# Patient Record
Sex: Male | Born: 1955
Health system: Southern US, Community
[De-identification: ages and names within clinical notes are randomized; demographics above are authoritative.]

## PROBLEM LIST (undated history)

## (undated) DIAGNOSIS — K219 Gastro-esophageal reflux disease without esophagitis: Secondary | ICD-10-CM

## (undated) DIAGNOSIS — J45909 Unspecified asthma, uncomplicated: Secondary | ICD-10-CM

## (undated) DIAGNOSIS — I2510 Atherosclerotic heart disease of native coronary artery without angina pectoris: Secondary | ICD-10-CM

## (undated) DIAGNOSIS — K222 Esophageal obstruction: Secondary | ICD-10-CM

## (undated) DIAGNOSIS — E119 Type 2 diabetes mellitus without complications: Secondary | ICD-10-CM

## (undated) DIAGNOSIS — G459 Transient cerebral ischemic attack, unspecified: Secondary | ICD-10-CM

## (undated) DIAGNOSIS — I219 Acute myocardial infarction, unspecified: Secondary | ICD-10-CM

## (undated) DIAGNOSIS — J302 Other seasonal allergic rhinitis: Secondary | ICD-10-CM

## (undated) DIAGNOSIS — E669 Obesity, unspecified: Secondary | ICD-10-CM

## (undated) DIAGNOSIS — G4700 Insomnia, unspecified: Secondary | ICD-10-CM

## (undated) DIAGNOSIS — G4733 Obstructive sleep apnea (adult) (pediatric): Secondary | ICD-10-CM

## (undated) DIAGNOSIS — K2970 Gastritis, unspecified, without bleeding: Secondary | ICD-10-CM

## (undated) DIAGNOSIS — I10 Essential (primary) hypertension: Secondary | ICD-10-CM

## (undated) DIAGNOSIS — M51369 Other intervertebral disc degeneration, lumbar region without mention of lumbar back pain or lower extremity pain: Secondary | ICD-10-CM

## (undated) DIAGNOSIS — M109 Gout, unspecified: Secondary | ICD-10-CM

## (undated) DIAGNOSIS — E785 Hyperlipidemia, unspecified: Secondary | ICD-10-CM

## (undated) DIAGNOSIS — K922 Gastrointestinal hemorrhage, unspecified: Secondary | ICD-10-CM

## (undated) DIAGNOSIS — M5136 Other intervertebral disc degeneration, lumbar region: Secondary | ICD-10-CM

## (undated) DIAGNOSIS — K5792 Diverticulitis of intestine, part unspecified, without perforation or abscess without bleeding: Secondary | ICD-10-CM

## (undated) DIAGNOSIS — Z9989 Dependence on other enabling machines and devices: Secondary | ICD-10-CM

## (undated) DIAGNOSIS — M1990 Unspecified osteoarthritis, unspecified site: Secondary | ICD-10-CM

## (undated) HISTORY — DX: Other intervertebral disc degeneration, lumbar region without mention of lumbar back pain or lower extremity pain: M51.369

## (undated) HISTORY — DX: Unspecified asthma, uncomplicated: J45.909

## (undated) HISTORY — DX: Esophageal obstruction: K22.2

## (undated) HISTORY — DX: Type 2 diabetes mellitus without complications: E11.9

## (undated) HISTORY — DX: Atherosclerotic heart disease of native coronary artery without angina pectoris: I25.10

## (undated) HISTORY — DX: Gout, unspecified: M10.9

## (undated) HISTORY — DX: Insomnia, unspecified: G47.00

## (undated) HISTORY — PX: NASAL SINUS SURGERY: SHX719

## (undated) HISTORY — DX: Transient cerebral ischemic attack, unspecified: G45.9

## (undated) HISTORY — DX: Gastro-esophageal reflux disease without esophagitis: K21.9

## (undated) HISTORY — DX: Gastritis, unspecified, without bleeding: K29.70

## (undated) HISTORY — PX: VASECTOMY: SHX75

## (undated) HISTORY — DX: Unspecified osteoarthritis, unspecified site: M19.90

## (undated) HISTORY — DX: Obesity, unspecified: E66.9

## (undated) HISTORY — PX: ESOPHAGOGASTRODUODENOSCOPY: SHX1529

## (undated) HISTORY — DX: Gastrointestinal hemorrhage, unspecified: K92.2

## (undated) HISTORY — DX: Obstructive sleep apnea (adult) (pediatric): G47.33

## (undated) HISTORY — DX: Other intervertebral disc degeneration, lumbar region: M51.36

## (undated) HISTORY — DX: Other seasonal allergic rhinitis: J30.2

## (undated) HISTORY — DX: Hyperlipidemia, unspecified: E78.5

## (undated) HISTORY — DX: Essential (primary) hypertension: I10

## (undated) HISTORY — DX: Dependence on other enabling machines and devices: Z99.89

---

## 1999-04-24 ENCOUNTER — Emergency Department (HOSPITAL_COMMUNITY): Admission: EM | Admit: 1999-04-24 | Discharge: 1999-04-24 | Payer: Self-pay | Admitting: Emergency Medicine

## 1999-05-29 ENCOUNTER — Emergency Department (HOSPITAL_COMMUNITY): Admission: EM | Admit: 1999-05-29 | Discharge: 1999-05-30 | Payer: Self-pay | Admitting: *Deleted

## 2000-06-14 ENCOUNTER — Ambulatory Visit (HOSPITAL_COMMUNITY): Admission: RE | Admit: 2000-06-14 | Discharge: 2000-06-14 | Payer: Self-pay | Admitting: Cardiology

## 2000-06-14 ENCOUNTER — Encounter: Payer: Self-pay | Admitting: Cardiology

## 2002-09-05 ENCOUNTER — Emergency Department (HOSPITAL_COMMUNITY): Admission: EM | Admit: 2002-09-05 | Discharge: 2002-09-05 | Payer: Self-pay | Admitting: Emergency Medicine

## 2002-09-05 ENCOUNTER — Encounter: Payer: Self-pay | Admitting: Emergency Medicine

## 2002-11-18 ENCOUNTER — Emergency Department (HOSPITAL_COMMUNITY): Admission: EM | Admit: 2002-11-18 | Discharge: 2002-11-18 | Payer: Self-pay | Admitting: Emergency Medicine

## 2002-11-19 ENCOUNTER — Encounter: Payer: Self-pay | Admitting: Emergency Medicine

## 2003-04-26 ENCOUNTER — Emergency Department (HOSPITAL_COMMUNITY): Admission: EM | Admit: 2003-04-26 | Discharge: 2003-04-26 | Payer: Self-pay | Admitting: Family Medicine

## 2003-07-16 ENCOUNTER — Emergency Department (HOSPITAL_COMMUNITY): Admission: EM | Admit: 2003-07-16 | Discharge: 2003-07-16 | Payer: Self-pay | Admitting: Emergency Medicine

## 2003-10-15 ENCOUNTER — Inpatient Hospital Stay (HOSPITAL_COMMUNITY): Admission: EM | Admit: 2003-10-15 | Discharge: 2003-10-16 | Payer: Self-pay | Admitting: Emergency Medicine

## 2003-11-11 ENCOUNTER — Ambulatory Visit: Payer: Self-pay | Admitting: Internal Medicine

## 2003-12-06 ENCOUNTER — Ambulatory Visit: Payer: Self-pay | Admitting: Internal Medicine

## 2003-12-06 ENCOUNTER — Ambulatory Visit (HOSPITAL_COMMUNITY): Admission: RE | Admit: 2003-12-06 | Discharge: 2003-12-06 | Payer: Self-pay | Admitting: Nurse Practitioner

## 2003-12-16 ENCOUNTER — Ambulatory Visit: Payer: Self-pay | Admitting: *Deleted

## 2004-10-26 ENCOUNTER — Ambulatory Visit: Payer: Self-pay | Admitting: Internal Medicine

## 2005-05-06 ENCOUNTER — Emergency Department (HOSPITAL_COMMUNITY): Admission: EM | Admit: 2005-05-06 | Discharge: 2005-05-06 | Payer: Self-pay | Admitting: Emergency Medicine

## 2006-01-24 ENCOUNTER — Emergency Department (HOSPITAL_COMMUNITY): Admission: EM | Admit: 2006-01-24 | Discharge: 2006-01-24 | Payer: Self-pay | Admitting: Emergency Medicine

## 2006-06-14 ENCOUNTER — Emergency Department (HOSPITAL_COMMUNITY): Admission: EM | Admit: 2006-06-14 | Discharge: 2006-06-14 | Payer: Self-pay | Admitting: Emergency Medicine

## 2006-06-24 ENCOUNTER — Encounter: Admission: RE | Admit: 2006-06-24 | Discharge: 2006-06-24 | Payer: Self-pay | Admitting: Family Medicine

## 2006-09-03 ENCOUNTER — Encounter: Admission: RE | Admit: 2006-09-03 | Discharge: 2006-09-03 | Payer: Self-pay | Admitting: Orthopedic Surgery

## 2006-09-23 ENCOUNTER — Encounter: Admission: RE | Admit: 2006-09-23 | Discharge: 2006-10-07 | Payer: Self-pay | Admitting: Orthopedic Surgery

## 2006-10-29 ENCOUNTER — Encounter: Admission: RE | Admit: 2006-10-29 | Discharge: 2006-10-29 | Payer: Self-pay | Admitting: Family Medicine

## 2007-03-13 DIAGNOSIS — I219 Acute myocardial infarction, unspecified: Secondary | ICD-10-CM

## 2007-03-13 HISTORY — DX: Acute myocardial infarction, unspecified: I21.9

## 2007-07-28 ENCOUNTER — Emergency Department (HOSPITAL_COMMUNITY): Admission: EM | Admit: 2007-07-28 | Discharge: 2007-07-28 | Payer: Self-pay | Admitting: Emergency Medicine

## 2007-12-21 ENCOUNTER — Emergency Department (HOSPITAL_COMMUNITY): Admission: EM | Admit: 2007-12-21 | Discharge: 2007-12-21 | Payer: Self-pay | Admitting: Family Medicine

## 2008-02-15 ENCOUNTER — Inpatient Hospital Stay (HOSPITAL_COMMUNITY): Admission: EM | Admit: 2008-02-15 | Discharge: 2008-02-18 | Payer: Self-pay

## 2008-02-15 ENCOUNTER — Ambulatory Visit: Payer: Self-pay | Admitting: Interventional Radiology

## 2008-02-15 ENCOUNTER — Encounter: Payer: Self-pay | Admitting: Emergency Medicine

## 2008-02-17 ENCOUNTER — Encounter (INDEPENDENT_AMBULATORY_CARE_PROVIDER_SITE_OTHER): Payer: Self-pay | Admitting: Internal Medicine

## 2008-02-17 LAB — CONVERTED CEMR LAB: TSH: 1.99 microintl units/mL

## 2008-02-18 DIAGNOSIS — M6282 Rhabdomyolysis: Secondary | ICD-10-CM | POA: Insufficient documentation

## 2008-02-23 ENCOUNTER — Ambulatory Visit: Payer: Self-pay | Admitting: Nurse Practitioner

## 2008-02-23 DIAGNOSIS — R079 Chest pain, unspecified: Secondary | ICD-10-CM | POA: Insufficient documentation

## 2008-02-23 DIAGNOSIS — E119 Type 2 diabetes mellitus without complications: Secondary | ICD-10-CM

## 2008-02-23 DIAGNOSIS — M109 Gout, unspecified: Secondary | ICD-10-CM | POA: Insufficient documentation

## 2008-02-23 DIAGNOSIS — I10 Essential (primary) hypertension: Secondary | ICD-10-CM | POA: Insufficient documentation

## 2008-02-23 DIAGNOSIS — E1159 Type 2 diabetes mellitus with other circulatory complications: Secondary | ICD-10-CM | POA: Insufficient documentation

## 2008-02-23 DIAGNOSIS — I152 Hypertension secondary to endocrine disorders: Secondary | ICD-10-CM | POA: Insufficient documentation

## 2008-02-23 DIAGNOSIS — E785 Hyperlipidemia, unspecified: Secondary | ICD-10-CM | POA: Insufficient documentation

## 2008-02-23 LAB — CONVERTED CEMR LAB
Blood Glucose, Fingerstick: 109
Cholesterol, target level: 200 mg/dL
Cholesterol: 207 mg/dL
HDL goal, serum: 40 mg/dL
HDL: 27 mg/dL
Hgb A1c MFr Bld: 5.9 %
LDL Cholesterol: 120 mg/dL
LDL Goal: 100 mg/dL
Total CHOL/HDL Ratio: 7.7
Triglycerides: 301 mg/dL
VLDL: 60 mg/dL

## 2008-02-24 ENCOUNTER — Encounter (INDEPENDENT_AMBULATORY_CARE_PROVIDER_SITE_OTHER): Payer: Self-pay | Admitting: Nurse Practitioner

## 2008-02-24 LAB — CONVERTED CEMR LAB: Uric Acid, Serum: 8.3 mg/dL — ABNORMAL HIGH

## 2008-03-01 ENCOUNTER — Ambulatory Visit: Payer: Self-pay | Admitting: Nurse Practitioner

## 2008-03-01 DIAGNOSIS — M25519 Pain in unspecified shoulder: Secondary | ICD-10-CM | POA: Insufficient documentation

## 2008-03-01 LAB — CONVERTED CEMR LAB
ALT: 38 units/L (ref 0–53)
AST: 20 units/L (ref 0–37)
Albumin: 4.4 g/dL (ref 3.5–5.2)
Alkaline Phosphatase: 78 units/L (ref 39–117)
BUN: 16 mg/dL (ref 6–23)
Basophils Absolute: 0 10*3/uL (ref 0.0–0.1)
Basophils Relative: 1 % (ref 0–1)
CK-MB: 3.5 ng/mL (ref 0.3–4.0)
CO2: 25 meq/L (ref 19–32)
Calcium: 9.9 mg/dL (ref 8.4–10.5)
Chloride: 102 meq/L (ref 96–112)
Creatinine, Ser: 1.28 mg/dL (ref 0.40–1.50)
Eosinophils Absolute: 0.6 10*3/uL (ref 0.0–0.7)
Eosinophils Relative: 7 % — ABNORMAL HIGH (ref 0–5)
Glucose, Bld: 93 mg/dL (ref 70–99)
HCT: 43.2 % (ref 39.0–52.0)
Hemoglobin: 13.7 g/dL (ref 13.0–17.0)
Lymphocytes Relative: 41 % (ref 12–46)
Lymphs Abs: 3.3 10*3/uL (ref 0.7–4.0)
MCHC: 31.7 g/dL (ref 30.0–36.0)
MCV: 84.5 fL (ref 78.0–100.0)
Monocytes Absolute: 0.5 10*3/uL (ref 0.1–1.0)
Monocytes Relative: 6 % (ref 3–12)
Neutro Abs: 3.7 10*3/uL (ref 1.7–7.7)
Neutrophils Relative %: 46 % (ref 43–77)
Platelets: 287 10*3/uL (ref 150–400)
Potassium: 4.9 meq/L (ref 3.5–5.3)
RBC: 5.11 M/uL (ref 4.22–5.81)
RDW: 15.3 % (ref 11.5–15.5)
Relative Index: 0.9 (ref 0.0–2.5)
Sodium: 138 meq/L (ref 135–145)
Total Bilirubin: 0.6 mg/dL (ref 0.3–1.2)
Total CK: 396 units/L — ABNORMAL HIGH (ref 7–232)
Total Protein: 7.6 g/dL (ref 6.0–8.3)
WBC: 8.1 10*3/uL (ref 4.0–10.5)

## 2008-03-02 ENCOUNTER — Telehealth (INDEPENDENT_AMBULATORY_CARE_PROVIDER_SITE_OTHER): Payer: Self-pay | Admitting: Nurse Practitioner

## 2008-03-08 ENCOUNTER — Ambulatory Visit: Payer: Self-pay | Admitting: Cardiology

## 2008-03-08 ENCOUNTER — Ambulatory Visit: Payer: Self-pay | Admitting: Nurse Practitioner

## 2008-03-10 ENCOUNTER — Encounter: Payer: Self-pay | Admitting: Cardiology

## 2008-03-10 ENCOUNTER — Ambulatory Visit: Payer: Self-pay

## 2008-03-10 ENCOUNTER — Encounter (INDEPENDENT_AMBULATORY_CARE_PROVIDER_SITE_OTHER): Payer: Self-pay | Admitting: Nurse Practitioner

## 2008-03-11 LAB — CONVERTED CEMR LAB
Cholesterol: 208 mg/dL — ABNORMAL HIGH (ref 0–200)
HDL: 34 mg/dL — ABNORMAL LOW (ref >39)
LDL Cholesterol: 113 mg/dL — ABNORMAL HIGH (ref 0–99)
Total CHOL/HDL Ratio: 6.1
Total CK: 550 units/L — ABNORMAL HIGH (ref 7–232)
Triglycerides: 306 mg/dL — ABNORMAL HIGH (ref <150)
VLDL: 61 mg/dL — ABNORMAL HIGH (ref 0–40)

## 2008-03-12 DIAGNOSIS — K2970 Gastritis, unspecified, without bleeding: Secondary | ICD-10-CM

## 2008-03-12 HISTORY — DX: Gastritis, unspecified, without bleeding: K29.70

## 2008-03-15 ENCOUNTER — Telehealth (INDEPENDENT_AMBULATORY_CARE_PROVIDER_SITE_OTHER): Payer: Self-pay | Admitting: Nurse Practitioner

## 2008-03-17 ENCOUNTER — Telehealth (INDEPENDENT_AMBULATORY_CARE_PROVIDER_SITE_OTHER): Payer: Self-pay | Admitting: Nurse Practitioner

## 2008-03-22 ENCOUNTER — Ambulatory Visit: Payer: Self-pay | Admitting: Nurse Practitioner

## 2008-03-22 LAB — CONVERTED CEMR LAB: Total CK: 657 units/L — ABNORMAL HIGH (ref 7–232)

## 2008-03-23 ENCOUNTER — Encounter (INDEPENDENT_AMBULATORY_CARE_PROVIDER_SITE_OTHER): Payer: Self-pay | Admitting: Nurse Practitioner

## 2008-03-23 ENCOUNTER — Telehealth (INDEPENDENT_AMBULATORY_CARE_PROVIDER_SITE_OTHER): Payer: Self-pay | Admitting: Nurse Practitioner

## 2008-03-23 LAB — CONVERTED CEMR LAB: CRP: 0.3 mg/dL (ref <0.6)

## 2008-03-24 ENCOUNTER — Ambulatory Visit: Payer: Self-pay | Admitting: Nurse Practitioner

## 2008-03-24 LAB — CONVERTED CEMR LAB
Aldolase: 10.9 units/L — ABNORMAL HIGH (ref <=8.1)
Sed Rate: 12 mm/hr (ref 0–16)

## 2008-03-31 ENCOUNTER — Ambulatory Visit: Payer: Self-pay

## 2008-03-31 ENCOUNTER — Encounter: Payer: Self-pay | Admitting: Cardiology

## 2008-04-01 ENCOUNTER — Encounter (INDEPENDENT_AMBULATORY_CARE_PROVIDER_SITE_OTHER): Payer: Self-pay | Admitting: Nurse Practitioner

## 2008-04-15 ENCOUNTER — Encounter (INDEPENDENT_AMBULATORY_CARE_PROVIDER_SITE_OTHER): Payer: Self-pay | Admitting: Nurse Practitioner

## 2008-04-15 DIAGNOSIS — R748 Abnormal levels of other serum enzymes: Secondary | ICD-10-CM | POA: Insufficient documentation

## 2008-04-16 ENCOUNTER — Ambulatory Visit: Payer: Self-pay | Admitting: Cardiology

## 2008-05-06 ENCOUNTER — Telehealth (INDEPENDENT_AMBULATORY_CARE_PROVIDER_SITE_OTHER): Payer: Self-pay | Admitting: Nurse Practitioner

## 2008-05-10 ENCOUNTER — Telehealth (INDEPENDENT_AMBULATORY_CARE_PROVIDER_SITE_OTHER): Payer: Self-pay | Admitting: Nurse Practitioner

## 2008-05-10 ENCOUNTER — Ambulatory Visit (HOSPITAL_COMMUNITY): Admission: EM | Admit: 2008-05-10 | Discharge: 2008-05-10 | Payer: Self-pay | Admitting: Emergency Medicine

## 2008-05-10 ENCOUNTER — Ambulatory Visit: Payer: Self-pay | Admitting: Internal Medicine

## 2008-05-10 ENCOUNTER — Encounter (INDEPENDENT_AMBULATORY_CARE_PROVIDER_SITE_OTHER): Payer: Self-pay | Admitting: Nurse Practitioner

## 2008-05-11 ENCOUNTER — Telehealth (INDEPENDENT_AMBULATORY_CARE_PROVIDER_SITE_OTHER): Payer: Self-pay

## 2008-05-12 ENCOUNTER — Telehealth (INDEPENDENT_AMBULATORY_CARE_PROVIDER_SITE_OTHER): Payer: Self-pay | Admitting: Nurse Practitioner

## 2008-05-14 ENCOUNTER — Encounter (INDEPENDENT_AMBULATORY_CARE_PROVIDER_SITE_OTHER): Payer: Self-pay | Admitting: Nurse Practitioner

## 2008-05-14 ENCOUNTER — Ambulatory Visit: Payer: Self-pay | Admitting: Internal Medicine

## 2008-05-19 ENCOUNTER — Encounter: Payer: Self-pay | Admitting: Internal Medicine

## 2008-05-19 ENCOUNTER — Ambulatory Visit (HOSPITAL_COMMUNITY): Admission: RE | Admit: 2008-05-19 | Discharge: 2008-05-19 | Payer: Self-pay | Admitting: Internal Medicine

## 2008-05-20 DIAGNOSIS — K222 Esophageal obstruction: Secondary | ICD-10-CM | POA: Insufficient documentation

## 2008-06-21 ENCOUNTER — Ambulatory Visit: Payer: Self-pay | Admitting: Internal Medicine

## 2008-06-21 ENCOUNTER — Encounter (INDEPENDENT_AMBULATORY_CARE_PROVIDER_SITE_OTHER): Payer: Self-pay | Admitting: Nurse Practitioner

## 2008-06-21 ENCOUNTER — Ambulatory Visit (HOSPITAL_COMMUNITY): Admission: RE | Admit: 2008-06-21 | Discharge: 2008-06-21 | Payer: Self-pay | Admitting: Internal Medicine

## 2008-06-23 ENCOUNTER — Ambulatory Visit: Payer: Self-pay | Admitting: Nurse Practitioner

## 2008-06-23 ENCOUNTER — Telehealth (INDEPENDENT_AMBULATORY_CARE_PROVIDER_SITE_OTHER): Payer: Self-pay | Admitting: *Deleted

## 2008-06-23 DIAGNOSIS — F39 Unspecified mood [affective] disorder: Secondary | ICD-10-CM | POA: Insufficient documentation

## 2008-06-23 LAB — CONVERTED CEMR LAB: Blood Glucose, Fingerstick: 140

## 2008-06-24 LAB — CONVERTED CEMR LAB
ALT: 25 units/L (ref 0–53)
AST: 25 units/L (ref 0–37)
Albumin: 4.1 g/dL (ref 3.5–5.2)
Alkaline Phosphatase: 57 units/L (ref 39–117)
BUN: 23 mg/dL (ref 6–23)
Basophils Absolute: 0.1 10*3/uL (ref 0.0–0.1)
Basophils Relative: 1 % (ref 0–1)
CO2: 24 meq/L (ref 19–32)
Calcium: 9.7 mg/dL (ref 8.4–10.5)
Chloride: 102 meq/L (ref 96–112)
Cholesterol: 224 mg/dL — ABNORMAL HIGH (ref 0–200)
Creatinine, Ser: 1.25 mg/dL (ref 0.40–1.50)
Eosinophils Absolute: 0.8 10*3/uL — ABNORMAL HIGH (ref 0.0–0.7)
Eosinophils Relative: 9 % — ABNORMAL HIGH (ref 0–5)
Glucose, Bld: 87 mg/dL (ref 70–99)
HCT: 39.8 % (ref 39.0–52.0)
HDL: 20 mg/dL — ABNORMAL LOW (ref >39)
Hemoglobin: 13.1 g/dL (ref 13.0–17.0)
Lymphocytes Relative: 39 % (ref 12–46)
Lymphs Abs: 3.1 10*3/uL (ref 0.7–4.0)
MCHC: 32.9 g/dL (ref 30.0–36.0)
MCV: 81.6 fL (ref 78.0–100.0)
Monocytes Absolute: 0.6 10*3/uL (ref 0.1–1.0)
Monocytes Relative: 7 % (ref 3–12)
Neutro Abs: 3.6 10*3/uL (ref 1.7–7.7)
Neutrophils Relative %: 44 % (ref 43–77)
Platelets: 291 10*3/uL (ref 150–400)
Potassium: 4.2 meq/L (ref 3.5–5.3)
RBC: 4.88 M/uL (ref 4.22–5.81)
RDW: 15.8 % — ABNORMAL HIGH (ref 11.5–15.5)
Sodium: 140 meq/L (ref 135–145)
Total Bilirubin: 0.3 mg/dL (ref 0.3–1.2)
Total CHOL/HDL Ratio: 11.2
Total CK: 818 units/L — ABNORMAL HIGH (ref 7–232)
Total Protein: 7.3 g/dL (ref 6.0–8.3)
Triglycerides: 1260 mg/dL — ABNORMAL HIGH (ref <150)
WBC: 8 10*3/uL (ref 4.0–10.5)

## 2008-06-25 ENCOUNTER — Telehealth (INDEPENDENT_AMBULATORY_CARE_PROVIDER_SITE_OTHER): Payer: Self-pay | Admitting: Nurse Practitioner

## 2008-06-25 DIAGNOSIS — K047 Periapical abscess without sinus: Secondary | ICD-10-CM | POA: Insufficient documentation

## 2008-06-29 ENCOUNTER — Encounter (INDEPENDENT_AMBULATORY_CARE_PROVIDER_SITE_OTHER): Payer: Self-pay | Admitting: Nurse Practitioner

## 2008-06-30 ENCOUNTER — Encounter (INDEPENDENT_AMBULATORY_CARE_PROVIDER_SITE_OTHER): Payer: Self-pay | Admitting: Nurse Practitioner

## 2008-07-12 ENCOUNTER — Telehealth (INDEPENDENT_AMBULATORY_CARE_PROVIDER_SITE_OTHER): Payer: Self-pay | Admitting: Nurse Practitioner

## 2008-07-26 ENCOUNTER — Encounter (INDEPENDENT_AMBULATORY_CARE_PROVIDER_SITE_OTHER): Payer: Self-pay | Admitting: Nurse Practitioner

## 2008-07-29 ENCOUNTER — Encounter (INDEPENDENT_AMBULATORY_CARE_PROVIDER_SITE_OTHER): Payer: Self-pay | Admitting: Nurse Practitioner

## 2008-07-29 ENCOUNTER — Telehealth (INDEPENDENT_AMBULATORY_CARE_PROVIDER_SITE_OTHER): Payer: Self-pay | Admitting: Nurse Practitioner

## 2008-08-02 ENCOUNTER — Emergency Department (HOSPITAL_COMMUNITY): Admission: EM | Admit: 2008-08-02 | Discharge: 2008-08-02 | Payer: Self-pay | Admitting: Emergency Medicine

## 2008-08-02 ENCOUNTER — Telehealth (INDEPENDENT_AMBULATORY_CARE_PROVIDER_SITE_OTHER): Payer: Self-pay | Admitting: Nurse Practitioner

## 2008-08-03 ENCOUNTER — Telehealth (INDEPENDENT_AMBULATORY_CARE_PROVIDER_SITE_OTHER): Payer: Self-pay | Admitting: Nurse Practitioner

## 2008-08-03 DIAGNOSIS — M549 Dorsalgia, unspecified: Secondary | ICD-10-CM | POA: Insufficient documentation

## 2008-08-04 ENCOUNTER — Ambulatory Visit (HOSPITAL_COMMUNITY): Admission: RE | Admit: 2008-08-04 | Discharge: 2008-08-04 | Payer: Self-pay | Admitting: Internal Medicine

## 2008-08-04 DIAGNOSIS — M51379 Other intervertebral disc degeneration, lumbosacral region without mention of lumbar back pain or lower extremity pain: Secondary | ICD-10-CM | POA: Insufficient documentation

## 2008-08-04 DIAGNOSIS — M5137 Other intervertebral disc degeneration, lumbosacral region: Secondary | ICD-10-CM | POA: Insufficient documentation

## 2008-08-05 ENCOUNTER — Telehealth (INDEPENDENT_AMBULATORY_CARE_PROVIDER_SITE_OTHER): Payer: Self-pay | Admitting: Nurse Practitioner

## 2008-08-11 ENCOUNTER — Telehealth (INDEPENDENT_AMBULATORY_CARE_PROVIDER_SITE_OTHER): Payer: Self-pay | Admitting: Nurse Practitioner

## 2008-08-13 ENCOUNTER — Emergency Department (HOSPITAL_COMMUNITY): Admission: EM | Admit: 2008-08-13 | Discharge: 2008-08-13 | Payer: Self-pay | Admitting: Emergency Medicine

## 2008-08-27 ENCOUNTER — Telehealth (INDEPENDENT_AMBULATORY_CARE_PROVIDER_SITE_OTHER): Payer: Self-pay | Admitting: Nurse Practitioner

## 2008-08-27 ENCOUNTER — Ambulatory Visit: Payer: Self-pay | Admitting: Nurse Practitioner

## 2008-08-30 LAB — CONVERTED CEMR LAB
ALT: 33 units/L (ref 0–53)
AST: 32 units/L (ref 0–37)
Albumin: 4.2 g/dL (ref 3.5–5.2)
Alkaline Phosphatase: 44 units/L (ref 39–117)
BUN: 27 mg/dL — ABNORMAL HIGH (ref 6–23)
CO2: 21 meq/L (ref 19–32)
Calcium: 9.8 mg/dL (ref 8.4–10.5)
Chloride: 105 meq/L (ref 96–112)
Creatinine, Ser: 1.47 mg/dL (ref 0.40–1.50)
Glucose, Bld: 107 mg/dL — ABNORMAL HIGH (ref 70–99)
Potassium: 3.9 meq/L (ref 3.5–5.3)
Sodium: 140 meq/L (ref 135–145)
Total Bilirubin: 0.6 mg/dL (ref 0.3–1.2)
Total Protein: 7.6 g/dL (ref 6.0–8.3)
Uric Acid, Serum: 11.5 mg/dL — ABNORMAL HIGH (ref 4.0–7.8)

## 2008-10-11 ENCOUNTER — Ambulatory Visit: Payer: Self-pay | Admitting: Family Medicine

## 2008-10-11 ENCOUNTER — Encounter (INDEPENDENT_AMBULATORY_CARE_PROVIDER_SITE_OTHER): Payer: Self-pay | Admitting: Nurse Practitioner

## 2008-10-11 DIAGNOSIS — G4700 Insomnia, unspecified: Secondary | ICD-10-CM | POA: Insufficient documentation

## 2008-10-12 LAB — CONVERTED CEMR LAB
ALT: 27 units/L (ref 0–53)
AST: 19 units/L (ref 0–37)
Albumin: 4.5 g/dL (ref 3.5–5.2)
Alkaline Phosphatase: 46 units/L (ref 39–117)
BUN: 25 mg/dL — ABNORMAL HIGH (ref 6–23)
CO2: 22 meq/L (ref 19–32)
Calcium: 9.6 mg/dL (ref 8.4–10.5)
Chloride: 108 meq/L (ref 96–112)
Creatinine, Ser: 1.48 mg/dL (ref 0.40–1.50)
Glucose, Bld: 101 mg/dL — ABNORMAL HIGH (ref 70–99)
Potassium: 4.5 meq/L (ref 3.5–5.3)
Sodium: 144 meq/L (ref 135–145)
Total Bilirubin: 0.5 mg/dL (ref 0.3–1.2)
Total Protein: 7.5 g/dL (ref 6.0–8.3)
Uric Acid, Serum: 10 mg/dL — ABNORMAL HIGH (ref 4.0–7.8)

## 2008-11-03 ENCOUNTER — Telehealth (INDEPENDENT_AMBULATORY_CARE_PROVIDER_SITE_OTHER): Payer: Self-pay | Admitting: Nurse Practitioner

## 2008-11-11 ENCOUNTER — Ambulatory Visit: Payer: Self-pay | Admitting: Nurse Practitioner

## 2008-11-11 LAB — CONVERTED CEMR LAB
Blood Glucose, Fingerstick: 122
Microalb, Ur: 0.92 mg/dL (ref 0.00–1.89)
PSA: 0.33 ng/mL (ref 0.10–4.00)
Uric Acid, Serum: 9.1 mg/dL — ABNORMAL HIGH (ref 4.0–7.8)

## 2008-11-12 ENCOUNTER — Encounter (INDEPENDENT_AMBULATORY_CARE_PROVIDER_SITE_OTHER): Payer: Self-pay | Admitting: Nurse Practitioner

## 2008-11-23 ENCOUNTER — Ambulatory Visit: Payer: Self-pay | Admitting: Internal Medicine

## 2008-11-26 ENCOUNTER — Ambulatory Visit: Payer: Self-pay | Admitting: Nurse Practitioner

## 2008-11-26 LAB — CONVERTED CEMR LAB
Cholesterol: 230 mg/dL — ABNORMAL HIGH (ref 0–200)
HDL: 30 mg/dL — ABNORMAL LOW (ref >39)
LDL Cholesterol: 146 mg/dL — ABNORMAL HIGH (ref 0–99)
Total CHOL/HDL Ratio: 7.7
Triglycerides: 268 mg/dL — ABNORMAL HIGH (ref <150)
VLDL: 54 mg/dL — ABNORMAL HIGH (ref 0–40)

## 2008-11-29 ENCOUNTER — Encounter (INDEPENDENT_AMBULATORY_CARE_PROVIDER_SITE_OTHER): Payer: Self-pay | Admitting: Nurse Practitioner

## 2008-11-30 ENCOUNTER — Encounter (INDEPENDENT_AMBULATORY_CARE_PROVIDER_SITE_OTHER): Payer: Self-pay | Admitting: Nurse Practitioner

## 2008-12-01 ENCOUNTER — Encounter (INDEPENDENT_AMBULATORY_CARE_PROVIDER_SITE_OTHER): Payer: Self-pay | Admitting: Nurse Practitioner

## 2008-12-01 ENCOUNTER — Telehealth (INDEPENDENT_AMBULATORY_CARE_PROVIDER_SITE_OTHER): Payer: Self-pay | Admitting: Nurse Practitioner

## 2008-12-29 ENCOUNTER — Ambulatory Visit: Payer: Self-pay | Admitting: Nurse Practitioner

## 2008-12-29 LAB — CONVERTED CEMR LAB: Blood Glucose, Fingerstick: 147

## 2008-12-30 LAB — CONVERTED CEMR LAB
Total CK: 598 units/L — ABNORMAL HIGH (ref 7–232)
Troponin I: 0.01 ng/mL (ref <0.06)

## 2009-01-03 ENCOUNTER — Ambulatory Visit: Payer: Self-pay | Admitting: Nurse Practitioner

## 2009-01-05 ENCOUNTER — Telehealth (INDEPENDENT_AMBULATORY_CARE_PROVIDER_SITE_OTHER): Payer: Self-pay | Admitting: Nurse Practitioner

## 2009-01-18 ENCOUNTER — Encounter: Payer: Self-pay | Admitting: Cardiology

## 2009-01-19 ENCOUNTER — Ambulatory Visit: Payer: Self-pay | Admitting: Cardiology

## 2009-01-19 ENCOUNTER — Ambulatory Visit: Payer: Self-pay

## 2009-01-19 ENCOUNTER — Encounter: Payer: Self-pay | Admitting: Cardiology

## 2009-01-28 ENCOUNTER — Telehealth (INDEPENDENT_AMBULATORY_CARE_PROVIDER_SITE_OTHER): Payer: Self-pay | Admitting: Nurse Practitioner

## 2009-02-09 ENCOUNTER — Ambulatory Visit: Payer: Self-pay | Admitting: Nurse Practitioner

## 2009-02-09 DIAGNOSIS — I739 Peripheral vascular disease, unspecified: Secondary | ICD-10-CM | POA: Insufficient documentation

## 2009-02-09 LAB — CONVERTED CEMR LAB: Blood Glucose, Fingerstick: 102

## 2009-02-14 ENCOUNTER — Ambulatory Visit: Payer: Self-pay | Admitting: Nurse Practitioner

## 2009-02-23 ENCOUNTER — Encounter (INDEPENDENT_AMBULATORY_CARE_PROVIDER_SITE_OTHER): Payer: Self-pay | Admitting: Nurse Practitioner

## 2009-02-23 LAB — CONVERTED CEMR LAB
Cholesterol: 205 mg/dL — ABNORMAL HIGH (ref 0–200)
HDL: 33 mg/dL — ABNORMAL LOW (ref >39)
LDL Cholesterol: 124 mg/dL — ABNORMAL HIGH (ref 0–99)
Total CHOL/HDL Ratio: 6.2
Triglycerides: 239 mg/dL — ABNORMAL HIGH (ref <150)
VLDL: 48 mg/dL — ABNORMAL HIGH (ref 0–40)

## 2009-03-30 ENCOUNTER — Encounter (INDEPENDENT_AMBULATORY_CARE_PROVIDER_SITE_OTHER): Payer: Self-pay | Admitting: Nurse Practitioner

## 2009-03-30 ENCOUNTER — Telehealth (INDEPENDENT_AMBULATORY_CARE_PROVIDER_SITE_OTHER): Payer: Self-pay | Admitting: Nurse Practitioner

## 2009-05-17 ENCOUNTER — Emergency Department (HOSPITAL_COMMUNITY): Admission: EM | Admit: 2009-05-17 | Discharge: 2009-05-17 | Payer: Self-pay | Admitting: Emergency Medicine

## 2009-06-20 ENCOUNTER — Telehealth (INDEPENDENT_AMBULATORY_CARE_PROVIDER_SITE_OTHER): Payer: Self-pay | Admitting: Nurse Practitioner

## 2009-06-20 ENCOUNTER — Ambulatory Visit: Payer: Self-pay | Admitting: Nurse Practitioner

## 2009-06-20 DIAGNOSIS — K921 Melena: Secondary | ICD-10-CM | POA: Insufficient documentation

## 2009-06-20 LAB — CONVERTED CEMR LAB
Blood Glucose, Fingerstick: 165
Hgb A1c MFr Bld: 6.6 %

## 2009-06-28 ENCOUNTER — Ambulatory Visit: Payer: Self-pay | Admitting: Nurse Practitioner

## 2009-06-29 ENCOUNTER — Encounter (INDEPENDENT_AMBULATORY_CARE_PROVIDER_SITE_OTHER): Payer: Self-pay | Admitting: Nurse Practitioner

## 2009-06-29 LAB — CONVERTED CEMR LAB
ALT: 28 units/L (ref 0–53)
AST: 20 units/L (ref 0–37)
Albumin: 4.2 g/dL (ref 3.5–5.2)
Alkaline Phosphatase: 59 units/L (ref 39–117)
BUN: 20 mg/dL (ref 6–23)
CO2: 24 meq/L (ref 19–32)
Calcium: 9.7 mg/dL (ref 8.4–10.5)
Chloride: 102 meq/L (ref 96–112)
Cholesterol: 204 mg/dL — ABNORMAL HIGH (ref 0–200)
Creatinine, Ser: 1.11 mg/dL (ref 0.40–1.50)
Glucose, Bld: 101 mg/dL — ABNORMAL HIGH (ref 70–99)
HDL: 30 mg/dL — ABNORMAL LOW (ref >39)
LDL Cholesterol: 123 mg/dL — ABNORMAL HIGH (ref 0–99)
Potassium: 4.4 meq/L (ref 3.5–5.3)
Sodium: 137 meq/L (ref 135–145)
Total Bilirubin: 0.5 mg/dL (ref 0.3–1.2)
Total CHOL/HDL Ratio: 6.8
Total Protein: 7.2 g/dL (ref 6.0–8.3)
Triglycerides: 254 mg/dL — ABNORMAL HIGH (ref <150)
Uric Acid, Serum: 8.4 mg/dL — ABNORMAL HIGH (ref 4.0–7.8)
VLDL: 51 mg/dL — ABNORMAL HIGH (ref 0–40)

## 2009-07-26 ENCOUNTER — Telehealth (INDEPENDENT_AMBULATORY_CARE_PROVIDER_SITE_OTHER): Payer: Self-pay | Admitting: Nurse Practitioner

## 2009-07-27 ENCOUNTER — Telehealth (INDEPENDENT_AMBULATORY_CARE_PROVIDER_SITE_OTHER): Payer: Self-pay | Admitting: *Deleted

## 2009-07-27 ENCOUNTER — Ambulatory Visit: Payer: Self-pay | Admitting: Nurse Practitioner

## 2009-07-27 DIAGNOSIS — H539 Unspecified visual disturbance: Secondary | ICD-10-CM | POA: Insufficient documentation

## 2009-07-27 DIAGNOSIS — R51 Headache: Secondary | ICD-10-CM | POA: Insufficient documentation

## 2009-07-27 DIAGNOSIS — E669 Obesity, unspecified: Secondary | ICD-10-CM | POA: Insufficient documentation

## 2009-07-27 DIAGNOSIS — R519 Headache, unspecified: Secondary | ICD-10-CM | POA: Insufficient documentation

## 2009-07-27 LAB — CONVERTED CEMR LAB: Blood Glucose, Fingerstick: 221

## 2009-08-15 ENCOUNTER — Encounter (INDEPENDENT_AMBULATORY_CARE_PROVIDER_SITE_OTHER): Payer: Self-pay | Admitting: Nurse Practitioner

## 2009-10-19 ENCOUNTER — Ambulatory Visit: Payer: Self-pay | Admitting: Nurse Practitioner

## 2009-10-19 ENCOUNTER — Telehealth (INDEPENDENT_AMBULATORY_CARE_PROVIDER_SITE_OTHER): Payer: Self-pay | Admitting: Nurse Practitioner

## 2009-10-20 LAB — CONVERTED CEMR LAB
Pro B Natriuretic peptide (BNP): 16.5 pg/mL (ref 0.0–100.0)
Total CK: 866 units/L — ABNORMAL HIGH (ref 7–232)
Troponin I: 0.01 ng/mL (ref <0.06)
Uric Acid, Serum: 7.5 mg/dL (ref 4.0–7.8)

## 2009-10-21 ENCOUNTER — Telehealth (INDEPENDENT_AMBULATORY_CARE_PROVIDER_SITE_OTHER): Payer: Self-pay | Admitting: *Deleted

## 2009-10-24 ENCOUNTER — Telehealth (INDEPENDENT_AMBULATORY_CARE_PROVIDER_SITE_OTHER): Payer: Self-pay | Admitting: Nurse Practitioner

## 2009-10-26 ENCOUNTER — Ambulatory Visit: Payer: Self-pay | Admitting: Nurse Practitioner

## 2009-10-26 ENCOUNTER — Telehealth (INDEPENDENT_AMBULATORY_CARE_PROVIDER_SITE_OTHER): Payer: Self-pay | Admitting: Nurse Practitioner

## 2009-10-26 LAB — CONVERTED CEMR LAB
Bilirubin Urine: NEGATIVE
Glucose, Urine, Semiquant: NEGATIVE
Ketones, urine, test strip: NEGATIVE
Nitrite: NEGATIVE
Protein, U semiquant: NEGATIVE
Specific Gravity, Urine: >=1.03
Urobilinogen, UA: 0.2
WBC Urine, dipstick: NEGATIVE
pH: 5

## 2009-10-27 ENCOUNTER — Encounter (INDEPENDENT_AMBULATORY_CARE_PROVIDER_SITE_OTHER): Payer: Self-pay | Admitting: Nurse Practitioner

## 2009-11-03 ENCOUNTER — Telehealth (INDEPENDENT_AMBULATORY_CARE_PROVIDER_SITE_OTHER): Payer: Self-pay | Admitting: Nurse Practitioner

## 2010-03-28 ENCOUNTER — Encounter (INDEPENDENT_AMBULATORY_CARE_PROVIDER_SITE_OTHER): Payer: Self-pay | Admitting: Nurse Practitioner

## 2010-03-28 ENCOUNTER — Telehealth (INDEPENDENT_AMBULATORY_CARE_PROVIDER_SITE_OTHER): Payer: Self-pay | Admitting: Nurse Practitioner

## 2010-04-02 ENCOUNTER — Encounter: Payer: Self-pay | Admitting: Cardiology

## 2010-04-11 NOTE — Progress Notes (Signed)
Summary: Clarify Protonix RX   Phone Note From Pharmacy   Caller: [facility] Summary of Call: Fax from [place] for clarification on Protonix frequency.  Advised per EGD report, pt to take once daily, and should receive #30 tabs not #60.  Corrected on medication list. Initial call taken by: Francee Piccolo CMA,  June 23, 2008 3:39 PM    New/Updated Medications: PROTONIX 40 MG  TBEC (PANTOPRAZOLE SODIUM) 1 each day 30 minutes before meal   Prescriptions: PROTONIX 40 MG  TBEC (PANTOPRAZOLE SODIUM) 1 each day 30 minutes before meal  #30 x 11   Entered by:   Francee Piccolo CMA   Authorized by:   Iva Boop MD   Signed by:   Francee Piccolo CMA on 06/23/2008   Method used:   Telephoned to ...       [facility] (retail)       [address] [place], [place]  [address]       Ph: [phone]       Fax: [phone]   RxID:   [id number]

## 2010-04-11 NOTE — Miscellaneous (Signed)
Summary: change omeprazole to Protonix after EGD  Prescriptions: PROTONIX 40 MG  TBEC (PANTOPRAZOLE SODIUM) 1 each day 30 minutes before meal  #60 x 11   Entered and Authorized by:   Iva Boop MD   Signed by:   Iva Boop MD on 06/21/2008   Method used:   Print then Give to Patient   RxID:   [id number] PROTONIX 40 MG  TBEC (PANTOPRAZOLE SODIUM) 1 each day 30 minutes before meal  #60 x 11   Entered and Authorized by:   Iva Boop MD   Signed by:   Iva Boop MD on 06/21/2008   Method used:   Print then Give to Patient   RxID:   [id number] PROTONIX 40 MG  TBEC (PANTOPRAZOLE SODIUM) 1 each day 30 minutes before meal  #60 x 11   Entered and Authorized by:   Iva Boop MD   Signed by:   Iva Boop MD on 06/21/2008   Method used:   Print then Give to Patient   RxID:   [id number]  Not printed 3 times (printer errors) Iva Boop MD  June 21, 2008 12:25 PM

## 2010-04-11 NOTE — Progress Notes (Signed)
Summary: Requesting for his [facility] score   Phone Note Call from Patient Call back at [facility] Phone [phone]   Summary of Call: Pt would like to get his AIC score.  Please call him back. [facility] FNP Initial call taken by: Manon Hilding,  January 05, 2009 10:17 AM  Follow-up for Phone Call        pt informed of his last result of 6.2 in August 2010.  Phone note complete. This was in reference of a study he was going to be included in. Follow-up by: Levon Hedger,  January 17, 2009 10:45 AM

## 2010-04-11 NOTE — Progress Notes (Signed)
Summary: Office Visit/DEPRESSION SCREENING  Office Visit/DEPRESSION SCREENING   Imported By: Arta Bruce 11/12/2008 11:54:39  _____________________________________________________________________  External Attachment:    Type:   Image     Comment:   External Document

## 2010-04-11 NOTE — Progress Notes (Signed)
Summary: NEED LETTER FOR CHILD SUPPORT   Phone Note Call from Patient Call back at Home Phone [phone]   Summary of Call: Azlyn Wingler PT. 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 6th. AND UNDER DR. CARE UNTIL DR. Vicente Serene HIM . IT CAN BE FAXED TO RESPONSIBLE AGENT AND GUIL.[id number], CASE NUMBER [id number], THE FAX NUMBER IS [phone]. Initial call taken by: Leodis Rains,  March 02, 2008 2:59 PM  Follow-up for Phone Call        Forward to pcp. Follow-up by: Vesta Mixer CMA,  March 03, 2008 3:05 PM  Additional Follow-up for Phone Call Additional follow up Details #1::        Letter printed and in basket to fax back Additional Follow-up by: [name] FNP,  March 08, 2008 8:09 AM    Additional Follow-up for Phone Call Additional follow up Details #2::    Faxed to [phone] Follow-up by: Vesta Mixer CMA,  March 08, 2008 11:08 AM

## 2010-04-11 NOTE — Letter (Signed)
Summary: DENTAL REFERRAL/URGENT  DENTAL REFERRAL/URGENT   Imported By: Arta Bruce 06/29/2008 12:23:16  _____________________________________________________________________  External Attachment:    Type:   Image     Comment:   External Document

## 2010-04-11 NOTE — Progress Notes (Signed)
Summary: Schedule repeat EGD/DIL   Phone Note Outgoing Call Call back at [facility] Phone [phone] Call back at Work Phone [phone]   Call placed by: Darcey Nora RN,  May 11, 2008 1:33 PM Summary of Call: Spoke with patient about scheduling repeat endo with ballon dil , he is unable to come this week due to working.  He is scheduled for repeat on 05-19-08 1:30.  Pre-visit 05-14-08 9:00.  Follow-up for Phone Call        Great Follow-up by: Iva Boop MD,  May 11, 2008 2:20 PM

## 2010-04-11 NOTE — Progress Notes (Signed)
Summary: Requesting for 90 days supplies in some medications  Medications Added ZOLOFT 50 MG TABS (SERTRALINE HCL) One tablet by mouth daily       Phone Note Call from Patient Call back at [facility] Phone [phone]   Summary of Call: The pt is aware that he has refills from his norvasc, protonix, lisinopril , and atenelol medications but he is wondering if the provider can provide him next time 90 days supplies instead because he doesnt want to run out again. [facility]) Daphine Deutscher FnP  Initial call taken by: Manon Hilding,  November 03, 2008 8:38 AM  Follow-up for Phone Call        In order for pt to get a 90 day supply he will have to pay his co-payment x 3months for each medication. for instance if his copayment is $6 x 3months (90 days) = $18 PER RX so for 4 prescriptions it would cost at least $72 (assuming his co-payment is $6) Follow-up by: [name] FNP,  November 04, 2008 9:57 AM  Additional Follow-up for Phone Call Additional follow up Details #1::        pt is aware of cost for 90 day supply and would like to go ahead and have meds written for 90 days. Gaylyn Cheers RN  November 04, 2008 10:35 AM   Med list reviewed and pt is on 4 blood pressure medications, 2 cholesterol meds, protonix and zoloft for a total of 8 DAILY prescriptions. this does not include as needed meds.  He would need to get all these meds for a 90 day supply.  He should not pick and choose which of his chronic meds he takes when all of them have been ordered.  Rx printed n.martin,fnp  November 04, 2008  11:44 AM       Additional Follow-up for Phone Call Additional follow up Details #2::    pt aware he must pick up all meds and pay for 3 mos supply Follow-up by: Gaylyn Cheers RN,  November 04, 2008 12:38 PM  New/Updated Medications: ZOLOFT 50 MG TABS (SERTRALINE HCL) One tablet by mouth daily Prescriptions: ATENOLOL 100 MG TABS (ATENOLOL) Take one tablet by mouth daily for blood pressure  #90  x 0   Entered and Authorized by:   [name] FNP   Signed by:   [name] FNP on 11/04/2008   Method used:   Printed then faxed to ...       [facility] (retail)       [address] [place], [place]  [address]       Ph: [phone]       Fax: [phone]   RxID:   [id number] LISINOPRIL 20 MG TABS (LISINOPRIL) 2 tablets by mouth daily for blood pressure  #180 x 0   Entered and Authorized by:   [name] FNP   Signed by:   [name] FNP on 11/04/2008   Method used:   Printed then faxed to ...       [facility] (retail)       [address] [place], [place]  [address]       Ph: [phone]       Fax: [phone]   RxID:   [id number] ZOLOFT 50 MG TABS (SERTRALINE HCL) One tablet by mouth daily  #90 x 0  Entered and Authorized by:   [name] FNP   Signed by:   [name] FNP on 11/04/2008   Method used:   Printed then faxed to ...       [facility] (retail)       [address] [place], [place]  [address]       Ph: [phone]       Fax: [phone]   RxID:   [id number] NORVASC 5 MG TABS (AMLODIPINE BESYLATE) One tablet by mouth daily for blood pressure  #90 x 0   Entered and Authorized by:   [name] FNP   Signed by:   [name] FNP on 11/04/2008   Method used:   Printed then faxed to ...       [facility] (retail)       [address] [place], [place]  [address]       Ph: [phone]       Fax: [phone]   RxID:   [id number] GEMFIBROZIL 600 MG TABS (GEMFIBROZIL) 1 tablet by mouth 30 minutes before breakfast and 30 minutes before dinner  #180 x 0   Entered and Authorized by:   [name] FNP   Signed by:   [name] FNP on 11/04/2008   Method used:   Printed then faxed to ...       [facility] (retail)       [address] [place], [place]  [address]       Ph: [phone]       Fax: [phone]   RxID:   [id number] LOVAZA 1 GM CAPS (OMEGA-3-ACID ETHYL ESTERS) 2 capsules by mouth two times a day  #360 x 0   Entered and Authorized by:   [name] FNP   Signed by:   [name] FNP on 11/04/2008   Method used:   Printed then faxed to ...       [facility] (retail)       [address] [place], [place]  [address]       Ph: [phone]       Fax: [phone]   RxID:   [id number] PROTONIX 40 MG  TBEC (PANTOPRAZOLE SODIUM) 1 each day 30 minutes before meal  #90 x 0   Entered and Authorized by:   [name] FNP   Signed by:   [name] FNP on 11/04/2008   Method used:   Printed then faxed to ...       [facility] (retail)       [address] [place], [place]  [address]       Ph: [phone]       Fax: [phone]   RxID:   [id number] HYDRALAZINE HCL 25 MG TABS (HYDRALAZINE HCL) 1 tablet by mouth four times daily  #360 x 0   Entered and Authorized by:   [name] FNP   Signed by:   [name] FNP on 11/04/2008   Method used:   Printed then faxed to ...       [facility] (retail)  [address] [place], [place]  [address]       Ph: [phone]       Fax: [phone]   RxID:   [id number] LISINOPRIL 20 MG TABS (LISINOPRIL) 2 tablets by mouth daily for blood pressure  #90 x 0   Entered and Authorized by:   [name] FNP   Signed by:   [name] FNP on 11/04/2008   Method used:   Printed then faxed to ...       [facility] (retail)       [address] [place], [place]  [address]       Ph: [phone]       Fax: [phone]   RxID:   [id number] ATENOLOL 100 MG TABS (ATENOLOL) Take one tablet by mouth daily for blood pressure  #90 x 0   Entered and Authorized by:   [name] FNP   Signed by:    [name] FNP on 11/04/2008   Method used:   Printed then faxed to ...       [facility] (retail)       [address] [place], [place]  [address]       Ph: [phone]       Fax: [phone]   RxID:   [id number]

## 2010-04-11 NOTE — Progress Notes (Signed)
Summary: Visual changes, appt. made   Phone Note Call from Patient   Summary of Call: Pt. c/o severe visual changes on Sunday "like after you stare at the sun and then turn away."  Episode lasted an hour, has not reoccurred, also experienced nausea, with headache, felt "funny".  States had episode two years ago, at which time he was hospitalized in [place].  States CBG was 220-something and is taking BP meds daily as ordered.  Pt. was advised to seek emergent treatment if this reoccurs prior to appt. tomorrow. Initial call taken by: Dutch Quint RN,  Jul 26, 2009 3:02 PM

## 2010-04-11 NOTE — Letter (Signed)
Summary: Out of Work  [facility]  [address] [place], [place] [address]   Phone: [phone]  Fax: [phone]    October 26, 2009   Employee:  Alex [name]    To Whom It May Concern:   For Medical reasons, please excuse the above named employee from work for the following dates:  Start:   October 19, 2009 (seen in office)  End:   October 24, 2009 (May return to work)  If you need additional information, please feel free to contact our office.         Sincerely,    [name] FNP [facility]

## 2010-04-11 NOTE — Letter (Signed)
Summary: Handout Printed  Printed Handout:  - Stress Management

## 2010-04-11 NOTE — Letter (Signed)
Summary: *Referral Letter  [facility]  [address] [place], [place] [address]   Phone: [phone]  Fax: [phone]    03/30/2009   Alex [name] [address] [place], [place]  [address]  Phone: [phone]  To whom it may concern:  See below for [name]'s current health conditions which includes gout.  He has gouty flares which causes swelling, pain, and decreased mobility in the affected limb.  He reports an acute flare as his reason for being out of work this week.  [name] does have medications to take for these flares.  Current Medical Problems: 1)  INTERMITTENT CLAUDICATION (ICD-443.9) 2)  INSOMNIA UNSPECIFIED (ICD-780.52) 3)  DEGENERATIVE DISC DISEASE, LUMBAR SPINE (ICD-722.52) 4)  BACK PAIN (ICD-724.5) 5)  MOOD SWINGS (ICD-296.99) 6)  ESOPHAGEAL STRICTURE (ICD-530.3) 7)  HYPERTENSION, BENIGN ESSENTIAL (ICD-401.1) 8)  HYPERCHOLESTEROLEMIA (ICD-272.0) 9)  GOUT (ICD-274.9) 10)  DIABETES MELLITUS (ICD-250.00)    Sincerely,    [name] FNP [facility]

## 2010-04-11 NOTE — Assessment & Plan Note (Signed)
Summary: BLADDER INFECTION///KT   Nurse Visit   Vital Signs:  Patient profile:   55 year old male Temp:     97.0 degrees F oral Pulse rate:   68 / minute Pulse rhythm:   regular Resp:     20 per minute BP sitting:   130 / 92  (right arm)  Vitals Entered By: Dutch Quint RN (October 26, 2009 12:06 PM)  CC:  Thinks he may have a UTI.  History of Present Illness: Been having symptoms about a month, had gone away for awhile.  c/o intermittent sharp pains going through left lower abdomen, urine has a "funny" odor.  Currently, is better than it was.   Review of Systems GU:  Complains of dysuria, nocturia, and urinary frequency; denies discharge; had some itching and a little pain several weeks ago..   Patient Instructions: 1)  Drink plenty of fluids. 2)  We will send the urine for a culture -- we will let you know of any lab results.   Physical Exam  General:  alert, well-developed, well-nourished, and well-hydrated.    CC: Thinks he may have a UTI   Allergies: 1)  ! Codeine 2)  ! * Shellfish Laboratory Results   Urine Tests  Date/Time Received: October 26, 2009 12:29 PM    Routine Urinalysis   Color: lt. yellow Glucose: negative   (Normal Range: Negative) Bilirubin: negative   (Normal Range: Negative) Ketone: negative   (Normal Range: Negative) Spec. Gravity: >=1.030   (Normal Range: 1.003-1.035) Blood: trace-intact   (Normal Range: Negative) pH: 5.0   (Normal Range: 5.0-8.0) Protein: negative   (Normal Range: Negative) Urobilinogen: 0.2   (Normal Range: 0-1) Nitrite: negative   (Normal Range: Negative) Leukocyte Esterace: negative   (Normal Range: Negative)       Orders Added: 1)  Est. Patient Level I [[address]] 2)  UA Dipstick w/o Micro (automated)  [[address]] 3)  T-Culture, Urine [[id number]]

## 2010-04-11 NOTE — Letter (Signed)
Summary: REFERRAL/DENTAL  REFERRAL/DENTAL   Imported By: Arta Bruce 06/30/2008 11:37:27  _____________________________________________________________________  External Attachment:    Type:   Image     Comment:   External Document

## 2010-04-11 NOTE — Letter (Signed)
Summary: CALL-A-NURSE REPORT  CALL-A-NURSE REPORT   Imported By: Leodis Rains 07/26/2008 11:35:37  _____________________________________________________________________  External Attachment:    Type:   Image     Comment:   External Document

## 2010-04-11 NOTE — Letter (Signed)
Summary: CHECKLIST FOR DEPRESSION SCREENING  CHECKLIST FOR DEPRESSION SCREENING   Imported By: Arta Bruce 06/23/2008 11:44:01  _____________________________________________________________________  External Attachment:    Type:   Image     Comment:   External Document

## 2010-04-11 NOTE — Progress Notes (Signed)
Summary: LEG CRAMPS (MEDS)  Medications Added NAPROXEN 500 MG TABS (NAPROXEN) One tablet by mouth two times a day as needed for pain       Phone Note Call from Patient   Summary of Call: PT WANTS TO KNOW IF FNP MARTIN CAN'T PRESCRIBE SOMENTHING FOR LEG CRAMPING . PHARMACY [facility] HIM [phone]  [facility] YOU  Initial call taken by: Cheryll Dessert,  November 03, 2009 9:37 AM  Follow-up for Phone Call        c/o generalized cramping, but especially in his legs to where he can't walk.  States the cramping is in his groin area.  Taking potassium for the last 3 days, but doesn't know if it's in his system long enough to help.  Wants to know if there's anything else to do.  Advised to apply moist heat or to take a shower to relax muscles to relieve cramping.  Additional Follow-up for Phone Call Additional follow up Details #1::        Pt wants Aggie Cosier to call him back. Additional Follow-up by: Vesta Mixer CMA,  November 03, 2009 1:00 PM    Additional Follow-up for Phone Call Additional follow up Details #2::    Called back to explain that pain is mostly in his thighs.  Sent to N. Daphine Deutscher.  Dutch Quint RN  November 03, 2009 2:29 PM   Additional Follow-up for Phone Call Additional follow up Details #3:: Details for Additional Follow-up Action Taken: Don't see that pt has been prescribed potassium so this much be something he is taking over the counter. potassium ok during last visit here so unless he has been sweating a lot suspect that is not the problem. He was prescribed naprosyn in the past but i had it on hold because we were watching his kideys. Since kidneys are ok will restart naprosyn 500mg  as needed (rx in basket) n.martin,fnp  November 03, 2009  2:58 PM  Confirms that he is taking the potassium OTC.  Advised of provider's response and new Rx.  Confirmed [facility] with pt. -- Rx faxed. Additional Follow-up by: Dutch Quint RN,  November 03, 2009 3:10 PM  New/Updated  Medications: NAPROXEN 500 MG TABS (NAPROXEN) One tablet by mouth two times a day as needed for pain Prescriptions: NAPROXEN 500 MG TABS (NAPROXEN) One tablet by mouth two times a day as needed for pain  #50 x 0   Entered and Authorized by:   [name] FNP   Signed by:   [name] FNP on 11/03/2009   Method used:   Printed then faxed to ...       [facility] (retail)       [address] [place], [place]  [address]       Ph: [phone]       Fax: [phone]   RxID:   [id number]

## 2010-04-11 NOTE — Progress Notes (Signed)
Summary: back pain   Phone Note Call from Patient   Summary of Call: The Alex [name] would like to get a referral for an MRI because he is having pain in the lower section of his back.  Manon Hilding  Aug 03, 2008 3:12 PM  Follow-up for Phone Call        spoke with Alex [name] he says that he fell in [employer] last Monday didn't think he was hurt and by Friday he started having some problems over the weekend and he went to [place] on Sunday.  He was having stomach and back pain.  The pain in the stomach has slowed down some but his back pain is getting worse.  Alex [name] wants to know if he can get some imaging for his back because he is having an aching, burning pain in lower back and it feels like is legs want to give out. I pulled the ER visit from the E-charts and put on your desk. Follow-up by: Levon Hedger,  Aug 03, 2008 4:51 PM  Additional Follow-up for Phone Call Additional follow up Details #1::        I have reviewed the ER report. looks like the main focus was on GI problems. Alex [name] would need to get x-rays of back order in basket No appointment needed, he can go any day, any time for the x-ray, he will just need to take the order sheet Additional Follow-up by: [name] FNP,  Aug 04, 2008 8:03 AM  New Problems: BACK PAIN (ICD-724.5)   Additional Follow-up for Phone Call Additional follow up Details #2::    Alex [name] informed.  X-ray referral in front office accordian file.  He will come to pick this up Follow-up by: Levon Hedger,  Aug 04, 2008 9:14 AM  New Problems: BACK PAIN (ICD-724.5)

## 2010-04-11 NOTE — Letter (Signed)
Summary: Work Excuse  [facility]  [address] [place], [place] [address]   Phone: [phone]  Fax: [phone]    Today's Date: January 03, 2009  Name of Patient: Alex [name]  The above named patient had a medical visit today   Please take this into consideration when reviewing the time away from work  Special Instructions:  [  ] None  [ X] To be off the remainder of today, returning to the normal work schedule on Wednesday, January 05, 2009  [  ] To be off until the next scheduled appointment on ______________________.  [  ] Other ________________________________________________________________ ________________________________________________________________________   Sincerely yours,   [name] FNP [facility]

## 2010-04-11 NOTE — Progress Notes (Signed)
Summary: GI referral   Phone Note Outgoing Call   Summary of Call: GI referral ordered Reason - Blood in stool never had screening colonscopy no family hx of colons cancer Initial call taken by: [name] FNP,  June 20, 2009 10:02 AM

## 2010-04-11 NOTE — Procedures (Signed)
Summary: EGD   EGD  Procedure date:  05/10/2008  Findings:      Location: [facility]    EGD  Procedure date:  05/10/2008  Findings:      1) Food vs. pill retained in the distal esophagus. Removed with scope pressure. 2) Stricture in the distal esophagus. Tight, adult gastroscope would not pass. Not dilated due to trauma and transient bleeding. 3) 3 cm hiatal hernia in the cardia 4) Otherwise normal examination  Location: [facility]    Comments:      Will return on another day for dilation  ENDOSCOPY PROCEDURE REPORT  PATIENT:  Alex [name], Alex [name]  MR#:  [id number] BIRTHDATE:   1956-01-17   GENDER:   male  ENDOSCOPIST:   Iva Boop, MD, [facility]    PROCEDURE DATE:  05/10/2008 PROCEDURE:  EGD with foreign body removal ASA CLASS:   Class II INDICATIONS: food impaction, dysphagia   MEDICATIONS:    Fentanyl 100 mcg IV, Versed 8 mg TOPICAL ANESTHETIC:   Cetacaine Spray  DESCRIPTION OF PROCEDURE:   After the risks benefits and alternatives of the procedure were thoroughly explained, informed consent was obtained.  The EG-[id number] ([id number]) endoscope was introduced through the mouth and advanced to the second portion of the duodenum, without limitations.  The instrument was slowly withdrawn as the mucosa was fully examined. <<PROCEDUREIMAGES>>        <<OLD IMAGES>>  Retained food was present in the distal esophagus. The foreign body was removed and sent to pathology. The pill vs. food particle was dislodged, gently with the scope. There was some bleeding at the site which spontaneously stopped.  A stricture was found in the distal esophagus. Tight distal esophageal stricture. Adult gastroscope would not pass so pediartric gastroscope used to cross.  A hiatal hernia was found in the cardia. It was 3 cm in size.  Otherwise the examination was normal.    Retroflexed views revealed no abnormalities.    The scope was then withdrawn from the patient and the procedure  completed.  COMPLICATIONS:   None  ENDOSCOPIC IMPRESSION:  1) Food vs. pill retained in the distal esophagus. Removed with scope pressure.  2) Stricture in the distal esophagus. Tight, adult gastroscope would not pass. Not dilated due to trauma and transient bleeding.  3) 3 cm hiatal hernia in the cardia  4) Otherwise normal examination RECOMMENDATIONS:  Liquid diet only tonight. Soft foods until he can come back for a dilation.  Start Prilosec OTC 2 each day or go to [facility] for prescription (take this paper with you).  REPEAT EXAM:   In 1 - 2 week(s) for EGD with dilatation.  Will contact him and arrange balloon dilation at the hospital.   _______________________________ Iva Boop, MD, [facility]    CC: The Patient, [facility]  This report was created from the original endoscopy report, which was reviewed and signed by the above listed endoscopist.

## 2010-04-11 NOTE — Procedures (Signed)
Summary: EGD   EGD  Procedure date:  05/19/2008  Findings:      Location: [facility]    ENDOSCOPY PROCEDURE REPORT  PATIENT:  Alex [name], Alex [name]  MR#:  [id number] BIRTHDATE:   07-16-1955   GENDER:   male  ENDOSCOPIST:   Iva Boop, MD, [facility]    PROCEDURE DATE:  05/19/2008 PROCEDURE:  Esophagoscopy with balloon dilation (<51mm) ASA CLASS:   Class II INDICATIONS: 1) dysphagia  2) dilation of esophageal stricture   MEDICATIONS:    Fentanyl 100 mcg, Versed 10 mg IV TOPICAL ANESTHETIC:   Cetacaine Spray  DESCRIPTION OF PROCEDURE:   After the risks benefits and alternatives of the procedure were thoroughly explained, informed consent was obtained.  The EG-2990i ([id number]) endoscope was introduced through the mouth and advanced to the stomach body, limited by patient cooperation. The sedation was not copmpletely effective, he kept moving his mouth and slipping out of the bite block.  The instrument was slowly withdrawn as the mucosa was carefully examined. <<PROCEDUREIMAGES>>    <<OLD IMAGES>>  A stricture was found at the gastroesophageal junction. There was a tight, benign-appearing stricture with associated erosive esophagitis.  A hiatal hernia was found.  Retained food was present in the body of the stomach. A small amount of liquid and food was seen. The stomach was not explored further due to the oatient moving.    Dilation was then performed at the gastroesphageal junction  1) Dilator:   Balloon   Size(s):   10, 11, 12 mm Resistance:     Heme:   yes Appearance:   satisfactory Able to pass gastroscope through after dilation. Typical heme seen.  COMPLICATIONS:   None  ENDOSCOPIC IMPRESSION:  1) Stricture at the gastroesophageal junction  2) Hiatal hernia  3) Food, retained in the body of the stomach RECOMMENDATIONS:  He needs to start omeprazole (or equivalent) 40 mg bid. Prescription provided.  REPEAT EXAM:   In 2 - 4 week(s) for Dilatation.  My office will  call and arrange. Either needs Benadryl added or maybe propofol.   _______________________________ Iva Boop, MD, [facility]   CC: [facility]   This report was created from the original endoscopy report, which was reviewed and signed by the above listed endoscopist.     Appended Document: Orders Update ZEG scheduled at [facility] 05-21-08 8:30 .  Patient  verbalized understanding Darcey Nora RN  May 20, 2008 2:50 PM     Clinical Lists Changes  Problems: Added new problem of ESOPHAGEAL STRICTURE (ICD-530.3) Orders: Added new Test order of EGD (EGD) - Signed

## 2010-04-11 NOTE — Progress Notes (Signed)
Summary: NEED WORK NOTE   Phone Note Call from Patient Call back at [facility] Phone [phone]   Summary of Call: Alex [name] PT. Alex [name] SAYS THAT THE GOUT IN HIS LEFT FOOT HAS FLARED UP AGAIN. AND HE WASN'T ABLE TO GO TO WORK THIS WEEK SO FAR. HE STILL CAN'T GET A SHOE ON HIS FOOT. HE SAYS THAT HE IS GOING TO GET THE SCRIPT THAT YOU GAVE HIM IN DECEMBER ON [employer].  HIS JOB IS REQUIRING HIM TO GET  NOTE WHEN HE IS READY TO GO BACK TO WORK. Alex [name] SAYS THAT HE WOOULD LIKE TO TRY TO GO TOMORROW Initial call taken by: Leodis Rains,  March 30, 2009 11:45 AM  Follow-up for Phone Call        forward to N. Daphine Deutscher, FNP Follow-up by: Levon Hedger,  March 30, 2009 4:44 PM  Additional Follow-up for Phone Call Additional follow up Details #1::        will write a note indicating that pt has a history of gout and sometimes has flares. pt was not seen in office this week so unable to verify that he in fact had a gouty flare letter in basket Additional Follow-up by: [name] FNP,  March 30, 2009 5:29 PM    Additional Follow-up for Phone Call Additional follow up Details #2::    Spoke with pt he is informed that he can come pick up letter.  Letter will be in front office accordian file.  Follow-up by: Levon Hedger,  March 31, 2009 1:48 PM

## 2010-04-11 NOTE — Progress Notes (Signed)
Summary: Optho and Sleep study   Phone Note Outgoing Call   Summary of Call: pt needs 2 referrals 1.  Sleep lab for split night 2.  Opthomology - Dr. Mitzi Davenport (pt aware of co-payment ?115) notify pt of time/date for both these appt Initial call taken by: [name] FNP,  Jul 27, 2009 11:16 AM

## 2010-04-11 NOTE — Progress Notes (Signed)
Summary: Cholesterol medication   Phone Note Call from Patient   Summary of Call: Spoke with pt he says that he spoke with someone from [facility] office about his cholesterol medication and they said that he needs to be on it from the lab results that he was give.  He states that you told him to stop taking medication and wants to know when should he restart taking the medication for his cholesterol. Initial call taken by: Levon Hedger,  March 17, 2008 11:02 AM  Follow-up for Phone Call        attempted to call pt. no answer. left message to return call tomorrow to discuss labs n.martin 03/18/07  Additional Follow-up for Phone Call Additional follow up Details #1::        Spoke with pt. he is still having some vague muscle spasms. He is still holding his cholesterol medications as per this provider pt wil come into the office on monday at 9:30AM for CK. Additional Follow-up by: [name] FNP,  March 19, 2008 4:52 PM

## 2010-04-11 NOTE — Assessment & Plan Note (Signed)
Summary: HTN   Vital Signs:  Patient Profile:   55 Years Old Male Height:     67 inches Weight:      263 pounds BMI:     41.34 BSA:     2.27 Temp:     97.0 degrees F oral Pulse rate:   72 / minute Pulse rhythm:   regular Resp:     16 per minute BP sitting:   140 / 100  (left arm) Cuff size:   large  Pt. in pain?   no  Vitals Entered By: Levon Hedger (March 01, 2008 2:17 PM)              Is Patient Diabetic? No  Does patient need assistance? Ambulation Normal     History of Present Illness:  Pt into the office for follow up.  Rhabdo - noted when in hospital.  lab trended down during hospital stay.  Repeated on last week and CK had increased.  Gout - uric acid elevated on last visit.  He has an attack about once a month. handout given about foods to avoid.  Shoulder pain - mostly when he wakes in the morning.  Right worse than left.  No current meds.  Diabetes Management History:      The patient is a 55 years old male who comes in for evaluation of DM Type 2.  He states understanding of dietary principles and is following his diet appropriately.  No sensory loss is reported.  Self foot exams are not being performed.  He is not checking home blood sugars.  He says that he is not exercising regularly.        Hypoglycemic symptoms are not occurring.  No hyperglycemic symptoms are reported.  Other comments include: No current meds.    Hypertension History:      He complains of chest pain, but denies headache and palpitations.  Further comments include: Pt brings all his medications into the office.  HCTZ he just started taking about 3 days ago. since it was in similar packaging as the lisinopril his just assumed these meds were the same. chest pain has improved.        Positive major cardiovascular risk factors include male age 20 years old or older, diabetes, hyperlipidemia, and hypertension.  Negative major cardiovascular risk factors include negative family history  for ischemic heart disease and non-tobacco-user status.        Further assessment for target organ damage reveals no history of ASHD, stroke/TIA, or peripheral vascular disease.        Current Allergies (reviewed today): ! CODEINE ! * SHELLFISH    Risk Factors: Tobacco use:  quit    Year quit:  3-4 years ago Drug use:  no Caffeine use:  3 drinks per day Alcohol use:  no Exercise:  no Seatbelt use:  100 % Sun Exposure:  occasionally  Family History Risk Factors:    Family History of MI in females < 69 years old:  no    Family History of MI in males < 71 years old:  no   Review of Systems  CV      Complains of chest pain or discomfort and fatigue.      Denies palpitations and swelling of feet.      daily.    GI      Complains of indigestion.      started post hospitalization - takes otc acid meds  MS      shoulder  pain   Physical Exam  General:     alert and overweight-appearing.   Head:     normocephalic.   Eyes:     pupils round.  glasses Lungs:     normal breath sounds.   Heart:     normal rate and regular rhythm.   Abdomen:     obese nontender Neurologic:     alert & oriented X3.     Shoulder/Elbow Exam  General:    obese.    Shoulder Exam:    Right:    Inspection:  Normal       Location:  right AC joint    Stability:  stable    Tenderness:  no    Swelling:  no    Erythema:  no    Left:    Inspection:  Normal       Location:  left AC joint    Stability:  stable    Tenderness:  no    Swelling:  no    Erythema:  no    Impression & Recommendations:  Problem # 1:  CHEST PAIN (ICD-786.50) will refer to cardiology echo done in the hospital  Orders: Cardiology Referral (Cardiology)   Problem # 2:  RHABDOMYOLYSIS (ICD-728.88) CK elevated to 401 during last visit still with some stiffness in muscles especially shoulders - x-rays done in ER.  Problem # 3:  HYPERTENSION, BENIGN ESSENTIAL (ICD-401.1) BP still elevated will  increase lisinopril to 40mg  daily His updated medication list for this problem includes:    Hydrochlorothiazide 25 Mg Tabs (Hydrochlorothiazide) .Marland Kitchen... Take one tablet by mouth daily    Atenolol 100 Mg Tabs (Atenolol) .Marland Kitchen... Take one tablet by mouth daily for blood pressure    Lisinopril 40 Mg Tabs (Lisinopril) .Marland Kitchen... 1 tablet by mouth daily    Hydralazine Hcl 10 Mg Tabs (Hydralazine hcl) .Marland Kitchen... Take one tablet by mouth four times daily for blood pressure  Orders: Cardiology Referral (Cardiology)   Problem # 4:  HYPERCHOLESTEROLEMIA (ICD-272.0) will hold simvastatin for now will see trend of CK  His updated medication list for this problem includes:    Simvastatin 20 Mg Tabs (Simvastatin) .Marland Kitchen... Take one tablet by mouth at bedtime *elwaleed elnour, md   Problem # 5:  GOUT (ICD-274.9) infrequent flares will give meds as needed  if continues to be a problem will d/c hctz His updated medication list for this problem includes:    Naproxen 500 Mg Tabs (Naproxen) .Marland Kitchen... 1 tablet by mouth two times a day as needed for joint pain   Complete Medication List: 1)  Hydrochlorothiazide 25 Mg Tabs (Hydrochlorothiazide) .... Take one tablet by mouth daily 2)  Atenolol 100 Mg Tabs (Atenolol) .... Take one tablet by mouth daily for blood pressure 3)  Simvastatin 20 Mg Tabs (Simvastatin) .... Take one tablet by mouth at bedtime *elwaleed elnour, md 4)  Lisinopril 40 Mg Tabs (Lisinopril) .Marland Kitchen.. 1 tablet by mouth daily 5)  Adult Aspirin Ec Low Strength 81 Mg Tbec (Aspirin) 6)  Hydralazine Hcl 10 Mg Tabs (Hydralazine hcl) .... Take one tablet by mouth four times daily for blood pressure 7)  Naproxen 500 Mg Tabs (Naproxen) .Marland Kitchen.. 1 tablet by mouth two times a day as needed for joint pain  Diabetes Management Assessment/Plan:      The following lipid goals have been established for the patient: Total cholesterol goal of 200; LDL cholesterol goal of 100; HDL cholesterol goal of 40; Triglyceride goal of 150.  His  blood pressure goal  is < 130/80.    Hypertension Assessment/Plan:      The patient's hypertensive risk group is category C: Target organ damage and/or diabetes.  His calculated 10 year risk of coronary heart disease is 33 %.  Today's blood pressure is 140/100.  His blood pressure goal is < 130/80.   Patient Instructions: 1)  Hold simvastatin until labs repeated. 2)  You will be referred to cardiology for addition testing. 3)  You will need repeat labs in 1 week - CK, lipids and blood pressure check.  Take your blood pressure medications before next visit. 4)  You will need to stop your simvastatin until further notice. 5)  You will need a medications for your cholesterol but will need to determine which will be best.   Prescriptions: NAPROXEN 500 MG TABS (NAPROXEN) 1 tablet by mouth two times a day as needed for joint pain  #50 x 0   Entered and Authorized by:   [name] FNP   Signed by:   [name] FNP on 03/01/2008   Method used:   Print then Give to Patient   RxID:   [id number] HYDROCHLOROTHIAZIDE 25 MG TABS (HYDROCHLOROTHIAZIDE) Take one tablet by mouth daily  #30 x 1   Entered and Authorized by:   [name] FNP   Signed by:   [name] FNP on 03/01/2008   Method used:   Print then Give to Patient   RxID:   [id number] ATENOLOL 100 MG TABS (ATENOLOL) Take one tablet by mouth daily for blood pressure  #30 x 1   Entered and Authorized by:   [name] FNP   Signed by:   [name] FNP on 03/01/2008   Method used:   Print then Give to Patient   RxID:   [id number] HYDRALAZINE HCL 10 MG TABS (HYDRALAZINE HCL) Take one tablet by mouth four times daily for blood pressure  #120 x 1   Entered and Authorized by:   [name] FNP   Signed by:   [name] FNP on 03/01/2008   Method used:   Print then Give to Patient   RxID:   [id number] LISINOPRIL 40 MG TABS (LISINOPRIL) 1 tablet by mouth daily  #30 x 1   Entered and  Authorized by:   [name] FNP   Signed by:   [name] FNP on 03/01/2008   Method used:   Print then Give to Patient   RxID:   [id number]  ]  Appended Document: HTN        Current Allergies: ! CODEINE ! * SHELLFISH        Complete Medication List: 1)  Hydrochlorothiazide 25 Mg Tabs (Hydrochlorothiazide) .... Take one tablet by mouth daily 2)  Atenolol 100 Mg Tabs (Atenolol) .... Take one tablet by mouth daily for blood pressure 3)  Simvastatin 20 Mg Tabs (Simvastatin) .... Take one tablet by mouth at bedtime *elwaleed elnour, md 4)  Lisinopril 40 Mg Tabs (Lisinopril) .Marland Kitchen.. 1 tablet by mouth daily 5)  Adult Aspirin Ec Low Strength 81 Mg Tbec (Aspirin) 6)  Hydralazine Hcl 10 Mg Tabs (Hydralazine hcl) .... Take one tablet by mouth four times daily for blood pressure 7)  Naproxen 500 Mg Tabs (Naproxen) .Marland Kitchen.. 1 tablet by mouth two times a day as needed for joint pain    ]  Orders Added: 1)  Flu Vaccine 59yrs + [90658] 2)  Admin 1st Vaccine [09323]    Influenza Vaccine    Vaccine Type: Fluvax 3+  Site: left deltoid    Mfr: Sanofi Pasteur    Dose: 0.5 ml    Route: IM    Given by: Levon Hedger    Exp. Date: 09/08/2008    Lot #: [id number]    VIS given: 10/03/06 version given March 01, 2008.  Flu Vaccine Consent Questions    Do you have a history of severe allergic reactions to this vaccine? no    Any prior history of allergic reactions to egg and/or gelatin? no    Do you have a sensitivity to the preservative Thimersol? no    Do you have a past history of Guillan-Barre Syndrome? no    Do you currently have an acute febrile illness? no    Have you ever had a severe reaction to latex? no    Vaccine information given and explained to patient? yes    ndc  [id number]

## 2010-04-11 NOTE — Progress Notes (Signed)
   Phone Note Call from Patient Call back at Home Phone [phone]   Caller: Patient Summary of Call: Since Saturday, the pt had some food huging in his throat that unable him to eat or drink.  The pt was eating pinto beans and tuna fish.  The pt cannot take anything and therefore he wants to know if the provider can referral him to [facility]. [facility] FNP   Initial call taken by: Manon Hilding,  May 10, 2008 4:36 PM  Follow-up for Phone Call        Per provider pt was to go to the Urgent care or ER.

## 2010-04-11 NOTE — Progress Notes (Signed)
Summary: Letter Excuse   Phone Note Call from Patient Call back at [phone]   Summary of Call: The pt received the exuse letter but he just shared that on Friday, Oct 21, 2009 he couldn't return to work because the medication tramadol unable him to work it seem that he is allergic to coudine, as result, the pt t is wondered if he can be exuce for Friday, Aug 12,2011.  Pt at this moment is at the lobby. [facility] fnp Initial call taken by: Manon Hilding,  October 26, 2009 11:49 AM  Follow-up for Phone Call        Sent to N. Daphine Deutscher.  Dutch Quint RN  October 26, 2009 12:01 PM   Additional Follow-up for Phone Call Additional follow up Details #1::        He has taken tramadol in the past with no problems..... however Will do this just this once but in the future will only excuse notes for day pt was seen in office and agreed upon dates out of work any time pt chooses to not go to work in the future will be at his own discretion - will not cover him Additional Follow-up by: [name] FNP,  October 26, 2009 12:08 PM

## 2010-04-11 NOTE — Letter (Signed)
Summary: Handout Printed  Printed Handout:  - Rhabdomyolysis (Muscle Fiber Breakdown)

## 2010-04-11 NOTE — Letter (Signed)
Summary: Out of Work  [facility]  [address] [place], [place] [address]   Phone: [phone]  Fax: [phone]    December 29, 2008   Employee:  Alex [name]    To Whom It May Concern:   For Medical reasons, please excuse the above named employee from work for the following dates:  Start:   October 20th, 2010 (seen in office today)  End:   October 25th, 2010 ([name] will be back into the office for re-evaluation on this day and will recieve another update regarding his status)  If you need additional information, please feel free to contact our office.         Sincerely,    [name] FNP

## 2010-04-11 NOTE — Procedures (Signed)
Summary: EGD   EGD  Procedure date:  06/21/2008  Findings:      Location: [facility]    ENDOSCOPY PROCEDURE REPORT  PATIENT:  Alex [name], Alex [name]  MR#:  [id number] BIRTHDATE:   01/05/1956, 53 yrs. old   GENDER:   male  ENDOSCOPIST:   Iva Boop, MD, [facility]    PROCEDURE DATE:  06/21/2008 PROCEDURE:  EGD with balloon dilatation ASA CLASS:   Class II INDICATIONS: 1) dilation of esophageal stricture   MEDICATIONS:    Benadryl 50 mg, Fentanyl 75 mcg, Versed 4 mg IV TOPICAL ANESTHETIC:   Cetacaine Spray  DESCRIPTION OF PROCEDURE:   After the risks benefits and alternatives of the procedure were thoroughly explained, informed consent was obtained.  The EG-2990i ([id number]) endoscope was introduced through the mouth and advanced to the second portion of the duodenum, without limitations.  The instrument was slowly withdrawn as the mucosa was carefully examined. <<PROCEDUREIMAGES>>          <<OLD IMAGES>>  A stricture was found in the distal esophagus. Ring-like stricture, more patent than previous exam 05/19/2008.  A hiatal hernia was found in the cardia. It was 3 cm in size.  Moderate gastritis was found in the antrum. Sopkewheel eythematous streaks, no erosions.  The examination was otherwise normal.    Dilation was then performed at the distal esophagus  1) Dilator:   Balloon   Size(s):   18 mm Resistance:   moderate   Heme:   yes Appearance:   adequate Some oozing of blood after dilation (he retched with balloon in place) but this oozing stopped as it was observed.  COMPLICATIONS:   None  ENDOSCOPIC IMPRESSION:  1) Stricture in the distal esophagus, dilated to 18 mm.  2) 3 cm hiatal hernia in the cardia  3) Moderate gastritis in the antrum, non-erosive.  4) Otherwise normal examination. RECOMMENDATIONS:  Clear liquids until 2 PM then soft diet.  Resume normal diet tomorrow.  Change omeprazole to Protonix 40 mg daily (said omeprazole caused abdominal  pain).  Call Dr. Leone Payor for follow-up visit in 1-2 months.  REPEAT EXAM:   No   _______________________________ Iva Boop, MD, [facility]   CC: Merri Brunette, NP [facility] This report was created from the original endoscopy report, which was reviewed and signed by the above listed endoscopist.

## 2010-04-11 NOTE — Assessment & Plan Note (Signed)
Summary: NEW - Hospital f/u   Vital Signs:  Patient Profile:   55 Years Old Male Height:     67 inches Weight:      260.5 pounds BMI:     40.95 BSA:     2.26 Temp:     97.0 degrees F oral Pulse rate:   64 / minute Pulse rhythm:   regular Resp:     16 per minute BP sitting:   154 / 110  (left arm) Cuff size:   large  Pt. in pain?   no  Vitals Entered By: Levon Hedger (February 23, 2008 11:19 AM)              CBG Result 109 CBG Device ID b  Does patient need assistance? Ambulation Normal Comments pt states he was diagnosed as being diabetic but is not taking any medication     History of Present Illness:  Pt into the office to establish care. Pt was previously seen here over 2 years ago. Pt was seeing Dr. Loleta Chance at [facility] here in [place].  He was last seen there 3 months. Pt was seen in the hospital on 02/15/08 to 02/18/2008 Ruled out for MI  Diabetes - Pt reports that he was diabetic but he was not able to take the medications. Reports that the medication made his back hurt, nausea. but no diarrhea.    He is not sure of the name of the medication.  Pt self discontiinued the medications.  Gout - random bouts every 3 months.  Pt was taking a medication as needed for the flare. allergy to shellfish eats "hunk cheese"  Chest pain - pt reports cardiac cath in 1996.  He has some mild chest discomfort daily. pt is unable to recall the last stress test.  usually on the left side of chest independent of ambulation some shortness of breath -sweats  -nausea No recent stress test no cardiology f/u  Bil upper arm muscle pain - Dx with rhabdomyolysis.  CPK stable. total CK 300. x-rays done of bil shoulders. 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  He states understanding of dietary principles and is  following his diet appropriately.  No sensory loss is reported.  Self foot exams are not being performed.  He is not checking home blood sugars.  He says that he is not exercising regularly.        Other comments include: known dx in the past but no current meds.    Hypertension History:      pt brings all his medications into the office today.  He has taken today. All medications were changed while in the hospital.  He was taking Exforge and Diltiazem prior to hospitazation.        Positive major cardiovascular risk factors include male age 10 years old or older, diabetes, hyperlipidemia, and hypertension.  Negative major cardiovascular risk factors include negative family history for ischemic heart disease and non-tobacco-user status.        Further assessment for target organ damage reveals no history of ASHD, stroke/TIA, or peripheral vascular disease.    Lipid Management History:      Positive NCEP/ATP III risk factors include male age 70 years old or older, diabetes, HDL cholesterol less than 40, and hypertension.  Negative NCEP/ATP  III risk factors include no family history for ischemic heart disease, non-tobacco-user status, no ASHD (atherosclerotic heart disease), no prior stroke/TIA, no peripheral vascular disease, and no history of aortic aneurysm.        He expresses no side effects from his lipid-lowering medication.  Comments: pt started on cholesterol medications while in the hospital.  lipid panel reviewed. will check in 6 weeks.       Updated Prior Medication List: HYDROCHLOROTHIAZIDE 25 MG TABS (HYDROCHLOROTHIAZIDE) take one tablet by mouth daily *elwaleed elnour, md ATENOLOL 100 MG TABS (ATENOLOL) take one tablet by mouth every day *elwaleed elnour, md SIMVASTATIN 20 MG TABS (SIMVASTATIN) take one tablet by mouth at bedtime *elwaleed elnour, md LISINOPRIL 20 MG TABS (LISINOPRIL) take one tablet by mouth daily *elwaleed elnour, md ADULT ASPIRIN EC LOW STRENGTH 81 MG TBEC  (ASPIRIN)  HYDRALAZINE HCL 10 MG TABS (HYDRALAZINE HCL) take one tablet by mouth 4 times a day *elwaleed elnour, md  Current Allergies (reviewed today): ! CODEINE ! * SHELLFISH   Family History:    htn - mother (deceased), father      Social History:    tobacco - none    ETOH - none    Drug use - none    4 children - healthy   Risk Factors:  Tobacco use:  quit    Year quit:  3-4 years ago Drug use:  no Caffeine use:  3 drinks per day Alcohol use:  no Exercise:  no Seatbelt use:  100 % Sun Exposure:  occasionally  Family History Risk Factors:    Family History of MI in females < 17 years old:  no    Family History of MI in males < 96 years old:  no   Review of Systems  CV      Complains of chest pain or discomfort and fatigue.  Resp      Denies cough.  GI      Denies abdominal pain, nausea, and vomiting.   Physical Exam  General:     alert.   Head:     normocephalic.   Eyes:     pupils round.   Lungs:     normal breath sounds.   Heart:     normal rate and regular rhythm.   Abdomen:     soft, non-tender, and normal bowel sounds.   Msk:     up to exam table    Impression & Recommendations:  Problem # 1:  HYPERTENSION, BENIGN ESSENTIAL (ICD-401.1) BP is uncontrolled today. advised pt to be sure he is taking doses of hydralazine as ordered if still elevated on next visit will need med adjustment His updated medication list for this problem includes:    Hydrochlorothiazide 25 Mg Tabs (Hydrochlorothiazide) .Marland Kitchen... Take one tablet by mouth daily *elwaleed elnour, md    Atenolol 100 Mg Tabs (Atenolol) .Marland Kitchen... Take one tablet by mouth every day *elwaleed elnour, md    Lisinopril 20 Mg Tabs (Lisinopril) .Marland Kitchen... Take one tablet by mouth daily *elwaleed elnour, md    Hydralazine Hcl 10 Mg Tabs (Hydralazine hcl) .Marland Kitchen... Take one tablet by mouth 4 times a day *elwaleed elnour, md  Orders: T-Comprehensive Metabolic Panel [id number]) T-CBC w/Diff  ([id number])   Problem # 2:  HYPERCHOLESTEROLEMIA (ICD-272.0) started during hospital stay will check in 6 weeks His updated medication list for this problem includes:    Simvastatin 20 Mg Tabs (Simvastatin) .Marland Kitchen... Take one tablet by mouth at bedtime *elwaleed elnour, md  Problem # 3:  GOUT (ICD-274.9)  Orders: TLB-CK Total Only(Creatine Kinase/CPK) (82550-CK) T-Uric Acid (Blood) ([id number])   Problem # 4:  RHABDOMYOLYSIS (ICD-728.88) dx in hospital. will check labs today Orders: TLB-CK-MB (Creatine Kinase MB) (82553-CKMB)   Complete Medication List: 1)  Hydrochlorothiazide 25 Mg Tabs (Hydrochlorothiazide) .... Take one tablet by mouth daily *elwaleed elnour, md 2)  Atenolol 100 Mg Tabs (Atenolol) .... Take one tablet by mouth every day *elwaleed elnour, md 3)  Simvastatin 20 Mg Tabs (Simvastatin) .... Take one tablet by mouth at bedtime *elwaleed elnour, md 4)  Lisinopril 20 Mg Tabs (Lisinopril) .... Take one tablet by mouth daily *elwaleed elnour, md 5)  Adult Aspirin Ec Low Strength 81 Mg Tbec (Aspirin) 6)  Hydralazine Hcl 10 Mg Tabs (Hydralazine hcl) .... Take one tablet by mouth 4 times a day *elwaleed elnour, md  Other Orders: Capillary Blood Glucose (82948) Fingerstick (36416) Hemoglobin A1C (79024)  Diabetes Management Assessment/Plan:      The following lipid goals have been established for the patient: Total cholesterol goal of 200; LDL cholesterol goal of 100; HDL cholesterol goal of 40; Triglyceride goal of 150.  His blood pressure goal is < 130/80.    Hypertension Assessment/Plan:      The patient's hypertensive risk group is category C: Target organ damage and/or diabetes.  His calculated 10 year risk of coronary heart disease is 33 %.  Today's blood pressure is 154/110.  His blood pressure goal is < 130/80.  Lipid Assessment/Plan:      Based on NCEP/ATP III, the patient's risk factor category is "history of diabetes".  From this information, the patient's  calculated lipid goals are as follows: Total cholesterol goal is 200; LDL cholesterol goal is 100; HDL cholesterol goal is 40; Triglyceride goal is 150.     Patient Instructions: 1)  Sign a release to get records from Dr. Loleta Chance at [facility] 2)  You will need to be referred to cardiology for additional testing. 3)  Labs will be checked today. 4)  If ok, you can start some medications for shoulders on next week.  need to check labs first 5)  Follow up in 1 week in this office.  If blood pressure is still elevated you will need additional medications. 6)  Remember to keep eligibility appointment on 02/26/08.   ] Laboratory Results   Blood Tests   Date/Time Received: February 23, 2008 11:39 AM  Date/Time Reported: February 23, 2008 11:39 AM   HGBA1C: 5.9%   (Normal Range: Non-Diabetic - 3-6%   Control Diabetic - 6-8%) CBG Random:: 109       Vital Signs:  Patient Profile:   55 Years Old Male Height:     67 inches Weight:      260.5 pounds BMI:     40.95 BSA:     2.26 Temp:     97.0 degrees F oral Pulse rate:   64 / minute Pulse rhythm:   regular Resp:     16 per minute BP sitting:   154 / 110 Cuff size:   large             CBG Result 109 CBG Device ID b     Echocardiogram  Procedure date:  02/15/2008  Findings:      left ventricular EF was 55-65% the was no diagnositic of left ventricular regional wall motion abnomalitites.   left ventricular wall thickness was mildly increased. left ventricular diastolic function parameters were normal suggestion of a  possible mass sitting across from the tricuspid valve.  there is little movement, the appearance may be due to artifact but thrombus or mass can't be ruled out  X-ray  Procedure date:  02/16/2008  Findings:      Right shoulder - mild cystic degerative change of the humeral head otherwise normal right shoulder left shoulder - type 3 acromion which could predispose to impingement.

## 2010-04-11 NOTE — Progress Notes (Signed)
Summary: Wants name of illness   Phone Note Call from Patient   Summary of Call: Pt wants to the name of his illness.  Pt can be reached at [phone]. Initial call taken by: Vesta Mixer CMA,  October 24, 2009 10:16 AM  Follow-up for Phone Call        Explained to pt. that there is no real "name" for his creatine kinase elevations; reiterated the information from the provider as explained in his last phone note.  Advised that we are mailing information that might help explain what the elevations might manifest, but may not actually happen.  Verbalized understanding of discussion. Follow-up by: Dutch Quint RN,  October 24, 2009 3:01 PM

## 2010-04-11 NOTE — Letter (Signed)
Summary: Generic Letter  [facility]  [address] [place], [place] [address]   Phone: [phone]  Fax: [phone]    03/08/2008  Alex [name] [name] [place], [place]  [address]  Case # [id number]  To whom it may concern,  [name] was hospitalized on December 6th, 2009 and not been cleared to return to work.  He has an upcoming appointment with a cardiologist who will at that time evaluate the patient.  It is only after the completion of this evaluation will it be determined when patient can return to work.    Sincerely,     [name] FNP [facility]

## 2010-04-11 NOTE — Progress Notes (Signed)
Summary: Sed Rate, Adolase   Phone Note Call from Patient   Summary of Call: Spoke with pt he is going to come in tomorrow to get the Lavender tube drawn for the Sed Rate and Aldolase Per Zena Amos this blood should be added to Mondays lab visit he should not be charged another copay. Initial call taken by: Levon Hedger,  March 23, 2008 4:10 PM  Follow-up for Phone Call        noted. i sent a flag to front desk letting them know as well Follow-up by: [name] FNP,  March 24, 2008 8:08 AM

## 2010-04-11 NOTE — Progress Notes (Signed)
Summary: X-ray results   Phone Note Outgoing Call   Summary of Call: x-rays show some degeneration (arthritis) in his lower back but nothing acute.  Pain is likely muscle from the fall. It is not uncommon to have this 3-5 days after the fall as the muscles get inflammed. He has naprosyn that was previously ordered for shoulder pain.  It would help with back pain. He should take one tablet by mouth two times a day for the next 3 days (with food). he was given ultram in the Er which he can take in between the naprosyn if necessary Initial call taken by: [name] FNP,  Aug 05, 2008 8:05 AM  Follow-up for Phone Call        pt infomed.   Follow-up by: Levon Hedger,  Aug 06, 2008 10:36 AM

## 2010-04-11 NOTE — Letter (Signed)
Summary: Out of Work  [facility]  [address] [place], [place] [address]   Phone: [phone]  Fax: [phone]    October 19, 2009   Employee:  CHEYENNE SCHUMM    To Whom It May Concern:   For Medical reasons, please excuse the above named employee from work for the following dates:  Start:   October 19, 2009 (seen in office today)  End:   October 20, 2009   If you need additional information, please feel free to contact our office.         Sincerely,    [name] FNP [facility]

## 2010-04-11 NOTE — Progress Notes (Signed)
Summary: Naproxen Refill   Phone Note Call from Patient Call back at [facility] Phone [phone]   Caller: Patient Summary of Call: The pt is requesting for more refills from naproxen medication.  [facility] FNP Initial call taken by: Manon Hilding,  May 06, 2008 9:29 AM  Follow-up for Phone Call        last filled on 03/01/08. Forward to N.Martin,FNP Follow-up by: Levon Hedger,  May 06, 2008 11:00 AM  Additional Follow-up for Phone Call Additional follow up Details #1::        Rx in basket. Fax to [facility] pharmacy inform the pt Additional Follow-up by: [name] FNP,  May 06, 2008 11:58 AM    Additional Follow-up for Phone Call Additional follow up Details #2::    Rx faxed.  Pt notified.  Phone note complete. Follow-up by: Levon Hedger,  May 07, 2008 2:43 PM    Prescriptions: NAPROXEN 500 MG TABS (NAPROXEN) 1 tablet by mouth two times a day as needed for joint pain  #50 x 0   Entered and Authorized by:   [name] FNP   Signed by:   [name] FNP on 05/06/2008   Method used:   Print then Give to Patient   RxID:   [id number]

## 2010-04-11 NOTE — Letter (Signed)
Summary: Lipid Letter  [facility]  [address] [place], [place] [address]   Phone: [phone]  Fax: [phone]    06/29/2009  Alex [name] [address] [place], [place]  [address]  Dear Rande Lawman:  We have carefully reviewed your last lipid profile from 06/28/2009 and the results are noted below with a summary of recommendations for lipid management.    Cholesterol:       204     Goal: less than 200   HDL "good" Cholesterol:   30     Goal: greater than 40   LDL "bad" Cholesterol:   123     Goal: less than 100   Triglycerides:       254     Goal: less than 150    Your cholesterol is still slightly higher than goal. You should have been contacted by this office regarding the recommendations.  Also start to exercise and remember not to eat fried and fatty foods.    Current Medications: 1)    Atenolol 100 Mg Tabs (Atenolol) .... Take one tablet by mouth daily for blood pressure 2)    Lisinopril 40 Mg Tabs (Lisinopril) .... One tablet by mouth daily 3)    Adult Aspirin Ec Low Strength 81 Mg Tbec (Aspirin) 4)    Hydralazine Hcl 25 Mg Tabs (Hydralazine hcl) .... One tablet by mouth two times a day 5)    Naproxen 500 Mg Tabs (Naproxen) .... Hold 6)    Protonix 40 Mg  Tbec (Pantoprazole sodium) .Marland Kitchen.. 1 each day 30 minutes before meal 7)    Ultram 50 Mg Tabs (Tramadol hcl) .... One tablet by mouth two times a day as needed for pain 8)    Lovaza 1 Gm Caps (Omega-3-acid ethyl esters) .... 2 capsules by mouth two times a day 9)    Gemfibrozil 600 Mg Tabs (Gemfibrozil) .Marland Kitchen.. 1 tablet by mouth 30 minutes before breakfast and 30 minutes before dinner 10)    Allopurinol 300 Mg Tabs (Allopurinol) .... One tablet by mouth daily for gout **pharmacy - note dose change** 11)    Norvasc 5 Mg Tabs (Amlodipine besylate) .... One tablet by mouth daily for blood pressure 12)    Zoloft 50 Mg Tabs (Sertraline hcl) .... 1/2 tablet by mouth nightly for 1 week then increase to 1 tablet by  mouth nightly 13)    Colchicine 0.6 Mg Tabs (Colchicine) .... One tablet by mouth three times a day as needed for gout flare 14)    Meloxicam 15 Mg Tabs (Meloxicam) .... One tablet by mouth daily for feet  If you have any questions, please call. We appreciate being able to work with you.   Sincerely,    [facility] [name] FNP

## 2010-04-11 NOTE — Letter (Signed)
Summary: ENDOSCOPY REPORT  ENDOSCOPY REPORT   Imported By: Arta Bruce 06/30/2008 10:11:07  _____________________________________________________________________  External Attachment:    Type:   Image     Comment:   External Document

## 2010-04-11 NOTE — Letter (Signed)
Summary: MAILED REQUESTED RECORDS TO [facility]  MAILED REQUESTED RECORDS TO [facility]   Imported By: Arta Bruce 04/01/2008 14:32:32  _____________________________________________________________________  External Attachment:    Type:   Image     Comment:   External Document

## 2010-04-11 NOTE — Progress Notes (Signed)
Summary: Cholesterol  Phone Note Call from Patient   Caller: Patient Reason for Call: Talk to Doctor Summary of Call: PT SAID THAT DR Daphine Deutscher WAS GOING TO PUT HIM IN A FLP MEDICINE . Fuller Song HIM  [phone]  [facility] YOU   Initial call taken by: Cheryll Dessert,  Jul 12, 2008 11:27 AM  Follow-up for Phone Call        pt was wanting to know if he was to be on a cholestrol medication and I informed him that you had Lovaza listed for him to take and I had him call the pharmacy because you wrote the Rx in April.  He called the pharmacy and they told him that he needed to bring a copy of his tax information for him to get the medication.  He said he wish they had told him that because he cant read their minds.  He said that he would go Monday and get his infomation from the IRS. Follow-up by: Levon Hedger,  Jul 16, 2008 4:23 PM  Additional Follow-up for Phone Call Additional follow up Details #1::        Please be sure pt knows that although the pharmacy did not relay this information - his tax information is needed because they are going to get the medication from the drug company and it will be free of charge to him. However there is a 4-6 week period of lapse time between when he submits the paperwork and when the actual medications comes in. i didn't want to restart him on his previous cholesterol medication because of the aches he is still having in his shoulders and legs. i am also going to start gemfibrozil - 1 tablet by mouth before breakfast and before dinner. (rx in basket) this medication should be in stock now the tne when lovaza is available he will need to take both Additional Follow-up by: [name] FNP,  Jul 18, 2008 4:07 PM    Additional Follow-up for Phone Call Additional follow up Details #2::    pt informed Rx faxed to [facility] pharmacy. Follow-up by: Levon Hedger,  Jul 19, 2008 4:14 PM  New/Updated Medications: GEMFIBROZIL 600 MG TABS (GEMFIBROZIL) 1 tablet by mouth  30 minutes before breakfast and 30 minutes before dinner   Prescriptions: GEMFIBROZIL 600 MG TABS (GEMFIBROZIL) 1 tablet by mouth 30 minutes before breakfast and 30 minutes before dinner  #60 x 5   Entered and Authorized by:   [name] FNP   Signed by:   [name] FNP on 07/19/2008   Method used:   Printed then faxed to ...       [facility] (retail)       [address] [place], [place]  [address]       Ph: [phone]       Fax: [phone]   RxID:   [id number]

## 2010-04-11 NOTE — Assessment & Plan Note (Signed)
Summary: Acute - Chest pain   Vital Signs:  Patient profile:   55 year old male Weight:      287.6 pounds BMI:     45.21 O2 Sat:      97 % on Room air Temp:     97.6 degrees F oral Pulse rate:   61 / minute Pulse rhythm:   regular Resp:     16 per minute BP sitting:   124 / 83  (left arm) Cuff size:   large  Vitals Entered By: Levon Hedger (October 19, 2009 4:20 PM)  Nutrition Counseling: Patient's BMI is greater than 25 and therefore counseled on weight management options.  O2 Flow:  Room air CC: pt has been having some chest pain and SOB with some pain in his left arm...has been using his inhaler alot, Hypertension Management, Lipid Management Pain Assessment Patient in pain? no       Does patient need assistance? Functional Status Self care Ambulation Normal   CC:  pt has been having some chest pain and SOB with some pain in his left arm...has been using his inhaler alot, Hypertension Management, and Lipid Management.  History of Present Illness:  Pt into the office with c/o chest pain, and shortness of breath. Weakness started on yesterday. He had an inhaler that he he had to use also on yesterday and today. c/o chest pain that is intermittent - (cardiac workup done 01/2009 - negative) no relation to activity -nausea -sweats   Eye exam - Pt did get his eyes checked following his last visit and he is wearing glasses.  Sleep study - pt did not get as ordered. Pt reports that he was rescheduled a couple of time and on the last time he was rescheduled he decided not to complete the study  Hypertension History:      He denies headache, chest pain, and palpitations.  He notes no problems with any antihypertensive medication side effects.  Pt admits that he is taking meds as ordered.        Positive major cardiovascular risk factors include male age 52 years old or older, diabetes, hyperlipidemia, and hypertension.  Negative major cardiovascular risk factors include  negative family history for ischemic heart disease and non-tobacco-user status.        Positive history for target organ damage include peripheral vascular disease.  Further assessment for target organ damage reveals no history of ASHD, cardiac end-organ damage (CHF/LVH), stroke/TIA, renal insufficiency, or hypertensive retinopathy.    Lipid Management History:      Positive NCEP/ATP III risk factors include male age 30 years old or older, diabetes, HDL cholesterol less than 40, hypertension, and peripheral vascular disease.  Negative NCEP/ATP III risk factors include no family history for ischemic heart disease, non-tobacco-user status, no ASHD (atherosclerotic heart disease), no prior stroke/TIA, and no history of aortic aneurysm.        The patient states that he does not know about the "Therapeutic Lifestyle Change" diet.  The patient does not know about adjunctive measures for cholesterol lowering.  He expresses no side effects from his lipid-lowering medication.  The patient denies any symptoms to suggest myopathy or liver disease.      Habits & Providers  Alcohol-Tobacco-Diet     Alcohol drinks/day: 0     Tobacco Status: never     Year Quit: 3-4 years ago  Exercise-Depression-Behavior     Does Patient Exercise: no     Depression Counseling: not indicated; screening negative  for depression     Drug Use: no     Seat Belt Use: 100     Sun Exposure: occasionally  Allergies (verified): 1)  ! Codeine 2)  ! * Shellfish  Review of Systems General:  Complains of weakness; denies fever and loss of appetite. CV:  Complains of chest pain or discomfort. Resp:  Complains of shortness of breath. GI:  Denies abdominal pain, nausea, and vomiting. MS:  Complains of joint pain; left thumb - decreased ROM .  Physical Exam  General:  alert.  obese Head:  normocephalic.   Lungs:  normal breath sounds.   Heart:  normal rate and regular rhythm.   Msk:  up to the exam table Neurologic:  alert  & oriented X3.   Skin:  color normal.   Psych:  Oriented X3.     Impression & Recommendations:  Problem # 1:  CHEST PAIN (ICD-786.50) previous cardiac workup negative EKG normal will send labs Orders: TLB-BNP (B-Natriuretic Peptide) (83880-BNPR)  Problem # 2:  CPK, ABNORMAL (ICD-790.5) will recheck today Orders: TLB-CK-MB (Creatine Kinase MB) (82553-CKMB) TLB-CK Total Only(Creatine Kinase/CPK) (82550-CK) T-Troponin I ([id number])  Problem # 3:  HYPERTENSION, BENIGN ESSENTIAL (ICD-401.1) BP is stable today His updated medication list for this problem includes:    Atenolol 100 Mg Tabs (Atenolol) .Marland Kitchen... Take one tablet by mouth daily for blood pressure    Lisinopril 40 Mg Tabs (Lisinopril) ..... One tablet by mouth daily    Hydralazine Hcl 25 Mg Tabs (Hydralazine hcl) ..... One tablet by mouth two times a day    Norvasc 5 Mg Tabs (Amlodipine besylate) ..... One tablet by mouth daily for blood pressure  Orders: EKG w/ Interpretation (93000)  Problem # 4:  OBESITY (ICD-278.00) continued weight gain advised pt that she does need to exercise  Complete Medication List: 1)  Atenolol 100 Mg Tabs (Atenolol) .... Take one tablet by mouth daily for blood pressure 2)  Lisinopril 40 Mg Tabs (Lisinopril) .... One tablet by mouth daily 3)  Adult Aspirin Ec Low Strength 81 Mg Tbec (Aspirin) 4)  Hydralazine Hcl 25 Mg Tabs (Hydralazine hcl) .... One tablet by mouth two times a day 5)  Naproxen 500 Mg Tabs (Naproxen) .... Hold 6)  Protonix 40 Mg Tbec (Pantoprazole sodium) .Marland Kitchen.. 1 each day 30 minutes before meal 7)  Ultram 50 Mg Tabs (Tramadol hcl) .... One tablet by mouth two times a day as needed for pain 8)  Lovaza 1 Gm Caps (Omega-3-acid ethyl esters) .... 2 capsules by mouth two times a day 9)  Gemfibrozil 600 Mg Tabs (Gemfibrozil) .Marland Kitchen.. 1 tablet by mouth 30 minutes before breakfast and 30 minutes before dinner 10)  Allopurinol 300 Mg Tabs (Allopurinol) .... One tablet by mouth daily for  gout **pharmacy - note dose change** 11)  Norvasc 5 Mg Tabs (Amlodipine besylate) .... One tablet by mouth daily for blood pressure 12)  Zoloft 50 Mg Tabs (Sertraline hcl) .... 1/2 tablet by mouth nightly for 1 week then increase to 1 tablet by mouth nightly 13)  Colchicine 0.6 Mg Tabs (Colchicine) .... One tablet by mouth three times a day as needed for gout flare  Other Orders: T-Uric Acid (Blood) ([id number])  Hypertension Assessment/Plan:      The patient's hypertensive risk group is category C: Target organ damage and/or diabetes.  His calculated 10 year risk of coronary heart disease is 18 %.  Today's blood pressure is 124/83.  His blood pressure goal is < 130/80.  Lipid Assessment/Plan:  Based on NCEP/ATP III, the patient's risk factor category is "history of coronary disease, peripheral vascular disease, cerebrovascular disease, or aortic aneurysm along with either diabetes, current smoker, or LDL > 130 plus HDL < 40 plus triglycerides > 200".  The patient's lipid goals are as follows: Total cholesterol goal is 200; LDL cholesterol goal is 100; HDL cholesterol goal is 40; Triglyceride goal is 150.    Patient Instructions: 1)  Blood pressure is ok. 2)  EKG is unchanged from before 3)  Labs will be done and you will be notified of the results in the morning. 4)  No work today until cleared in the morning 5)  Follow up as needed

## 2010-04-11 NOTE — Progress Notes (Signed)
Summary: WANTS LAB RESULTS   Phone Note Call from Patient   Reason for Call: Lab or Test Results Summary of Call: Alex [name] PT. MR Alex [name] WANTS TO SPEAK TO YOU BRENDA, ABOUT HIS LAB RESULTS AND HE SAYS THAT HE HAS BEEN HAVING HEADACHES. Initial call taken by: Leodis Rains,  December 01, 2008 11:51 AM  Follow-up for Phone Call        Pt did recieve the letter about his cholesterol. He made the initial call prior to recieving the letter His concerns were answered with the letter Follow-up by: [name] FNP,  December 08, 2008 10:53 AM

## 2010-04-11 NOTE — Assessment & Plan Note (Signed)
Summary: HTN   Vital Signs:  Patient profile:   55 year old male Weight:      268 pounds Temp:     97.5 degrees F Pulse rate:   54 / minute Pulse rhythm:   regular Resp:     16 per minute BP sitting:   134 / 88  (left arm)  Vitals Entered By: Levon Hedger (November 11, 2008 10:27 AM) CC: follow-up visit 1 month, Hypertension Management, Lipid Management, Abdominal Pain Is Patient Diabetic? Yes  Pain Assessment Patient in pain? no      CBG Result 122 CBG Device ID B  Does patient need assistance? Functional Status Self care Ambulation Normal Comments pt did not bring medications today.   CC:  follow-up visit 1 month, Hypertension Management, Lipid Management, and Abdominal Pain.  History of Present Illness:  Pt into the office for f/u - last seen by Fannie Knee 1 month ago for depression  No medications today with pt.  Advised pt to bring meds to every visit.  Diabetes - last Hgba1c - 6.2 was ok in August.  No meds.  Optho - last eye exam 4 years ago.  He does wear reading glasses.   No retasure on file.  Depression - pt was started on zoloft during his last visit.  Pt was taking zoloft 25mg  however reports that he has some unwanted side effects.  He stopped taking the meds. 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  Pt would NOT like to restart medications at this time. He is trying to reduce environmental stressors.  Obesity - he did start to walk however reports that he has not been able to maintain  Dyspepsia History:      He has no history of a positive H. Pylori serology.  A prior EGD has been done which showed no evidence for moderate or severe esophagitis or Barrett's esophagus.    Hypertension History:      He denies headache, chest pain, and palpitations.  Pt reports that he is only taking hydralazine once per day.        Positive major cardiovascular risk  factors include male age 34 years old or older, diabetes, hyperlipidemia, and hypertension.  Negative major cardiovascular risk factors include negative family history for ischemic heart disease and non-tobacco-user status.        Further assessment for target organ damage reveals no history of ASHD, cardiac end-organ damage (CHF/LVH), stroke/TIA, peripheral vascular disease, renal insufficiency, or hypertensive retinopathy.    Lipid Management History:      Positive NCEP/ATP III risk factors include male age 29 years old or older, diabetes, HDL cholesterol less than 40, and hypertension.  Negative NCEP/ATP III risk factors include no family history for ischemic heart disease, non-tobacco-user status, no ASHD (atherosclerotic heart disease), no prior stroke/TIA, no peripheral vascular disease, and no history of aortic aneurysm.        The patient states that he does not know about the "Therapeutic Lifestyle Change" diet.  The patient does not know about adjunctive measures for cholesterol lowering.  He expresses no side effects from his lipid-lowering medication.  The patient denies any symptoms to suggest myopathy or liver disease.  Comments: Pt is still taking 2 cholesterol meds. Pt is not fasting today.      Allergies (verified): 1)  ! Codeine 2)  ! * Shellfish  Review of Systems  General:  Denies loss of appetite. CV:  Denies chest pain or discomfort. Resp:  Denies cough. GI:  Denies abdominal pain, nausea, and vomiting. Psych:  Complains of anxiety and depression; pt has stopped taking the medications.  Physical Exam  General:  alert.   Head:  normocephalic.   Lungs:  normal breath sounds.   Heart:  normal rate and regular rhythm.   Abdomen:  normal bowel sounds.   Msk:  up to the exam table no limits Neurologic:  alert & oriented X3.    Diabetes Management Exam:    Foot Exam (with socks and/or shoes not present):       Sensory-Monofilament:          Left foot: normal           Right foot: normal   Impression & Recommendations:  Problem # 1:  HYPERTENSION, BENIGN ESSENTIAL (ICD-401.1) BP is stable but really don't know what combination of meds pt is taking. advised pt to bring meds to all visits His updated medication list for this problem includes:    Atenolol 100 Mg Tabs (Atenolol) .Marland Kitchen... Take one tablet by mouth daily for blood pressure    Lisinopril 20 Mg Tabs (Lisinopril) .Marland Kitchen... 2 tablets by mouth daily for blood pressure    Hydralazine Hcl 25 Mg Tabs (Hydralazine hcl) .Marland Kitchen... 1 tablet by mouth four times daily    Norvasc 5 Mg Tabs (Amlodipine besylate) ..... One tablet by mouth daily for blood pressure  Problem # 2:  HYPERCHOLESTEROLEMIA (ICD-272.0) pt is not fasting today advised him because cholesterol was so elevated on last visit - he needs recheck ASAP His updated medication list for this problem includes:    Lovaza 1 Gm Caps (Omega-3-acid ethyl esters) .Marland Kitchen... 2 capsules by mouth two times a day    Gemfibrozil 600 Mg Tabs (Gemfibrozil) .Marland Kitchen... 1 tablet by mouth 30 minutes before breakfast and 30 minutes before dinner  Problem # 3:  DIABETES MELLITUS (ICD-250.00) stable will schedule retasure His updated medication list for this problem includes:    Lisinopril 20 Mg Tabs (Lisinopril) .Marland Kitchen... 2 tablets by mouth daily for blood pressure    Adult Aspirin Ec Low Strength 81 Mg Tbec (Aspirin)  Orders: Capillary Blood Glucose/CBG (16109) T-HIV Antibody  (Reflex) ([id number]) T-PSA ([id number]) T-Urine Microalbumin w/creat. ratio [phone])  Problem # 4:  GOUT (ICD-274.9) will check uric acid today His updated medication list for this problem includes:    Naproxen 500 Mg Tabs (Naproxen) .Marland Kitchen... 1 tablet by mouth two times a day as needed for joint pain    Allopurinol 300 Mg Tabs (Allopurinol) ..... One tablet by mouth daily for gout **pharmacy - note dose change**  Orders: T-Uric Acid (Blood) ([id number])  Problem # 5:  MOOD SWINGS  (ICD-296.99) pt has decided not to take meds at this time handout given on stress relief techniques  Complete Medication List: 1)  Atenolol 100 Mg Tabs (Atenolol) .... Take one tablet by mouth daily for blood pressure 2)  Lisinopril 20 Mg Tabs (Lisinopril) .... 2 tablets by mouth daily for blood pressure 3)  Adult Aspirin Ec Low Strength 81 Mg Tbec (Aspirin) 4)  Hydralazine Hcl 25 Mg Tabs (Hydralazine hcl) .Marland Kitchen.. 1 tablet by mouth four times daily 5)  Naproxen 500 Mg Tabs (Naproxen) .Marland Kitchen.. 1 tablet by mouth two times a day as needed for joint pain 6)  Protonix 40 Mg Tbec (Pantoprazole sodium) .Marland Kitchen.. 1 each day 30 minutes before meal 7)  Ultram 50 Mg Tabs (Tramadol  hcl) .... 1 tablet by mouth at night as needed for shoulder pain 8)  Lovaza 1 Gm Caps (Omega-3-acid ethyl esters) .... 2 capsules by mouth two times a day 9)  Gemfibrozil 600 Mg Tabs (Gemfibrozil) .Marland Kitchen.. 1 tablet by mouth 30 minutes before breakfast and 30 minutes before dinner 10)  Allopurinol 300 Mg Tabs (Allopurinol) .... One tablet by mouth daily for gout **pharmacy - note dose change** 11)  Norvasc 5 Mg Tabs (Amlodipine besylate) .... One tablet by mouth daily for blood pressure  Hypertension Assessment/Plan:      The patient's hypertensive risk group is category C: Target organ damage and/or diabetes.  Today's blood pressure is 134/88.  His blood pressure goal is < 130/80.  Lipid Assessment/Plan:      Based on NCEP/ATP III, the patient's risk factor category is "history of diabetes".  The patient's lipid goals are as follows: Total cholesterol goal is 200; LDL cholesterol goal is 100; HDL cholesterol goal is 40; Triglyceride goal is 150.    Patient Instructions: 1)  Follow up in 2 weeks for lipids - do not eat before this visit. 2)  and blood pressure check.  Bring all your medications to this office to review. 3)  Schedule next office visit with [email or web address] in December or sooner if necessary. Wil need flu vaccine then. 4)   Schedule a retasure exam at [facility].  Last LDL:                                                 NOT CALC mg/dL (16/12/9602 5:40:98 PM)        Diabetic Foot Exam    10-g (5.07) Semmes-Weinstein Monofilament Test Performed by: Levon Hedger          Right Foot          Left Foot Visual Inspection               Test Control      normal         normal Site 1         normal         normal Site 2         normal         normal Site 3         normal         normal Site 4         normal         normal Site 5         normal         normal Site 6         normal         normal Site 7         normal         normal Site 8         normal         normal Site 9         normal         normal Site 10         normal         normal  Impression      normal         normal

## 2010-04-11 NOTE — Miscellaneous (Signed)
  Clinical Lists Changes  Problems: Added new problem of * AORTIC ROOT DILATATION Observations: Added new observation of PAST MED HX:  INSOMNIA UNSPECIFIED (ICD-780.52) DEGENERATIVE DISC DISEASE, LUMBAR SPINE (ICD-722.52) BACK PAIN (ICD-724.5)story home no MOOD SWINGS (ICD-296.99) ESOPHAGEAL STRICTURE (ICD-530.3) CPK, ABNORMAL (ICD-790.5) SHOULDER PAIN, BILATERAL (ICD-719.41) HYPERTENSION, BENIGN ESSENTIAL (ICD-401.1) HYPERCHOLESTEROLEMIA (ICD-272.0) VASECTOMY, HX OF (ICD-V26.52) RHABDOMYOLYSIS (ICD-728.88) CHEST PAIN (ICD-786.50.Marland KitchenMarland KitchenMyoview.. December , 2009.. no ischemia.Marland Kitchen ejection fraction 48% EF 55%  echo... January, 2010... LVH... mild-to-moderate... echo.. January, 2010 Question RV/RA Mass. in the past... redo echo.. January, 2010... no mass seen dilated aortic root... mild... 2010... plan followup 2 years GOUT (ICD-274.9) DIABETES MELLITUS (ICD-250.00)   (01/18/2009 9:26)       Past History:  Past Medical History:  INSOMNIA UNSPECIFIED (ICD-780.52) DEGENERATIVE DISC DISEASE, LUMBAR SPINE (ICD-722.52) BACK PAIN (ICD-724.5)story home no MOOD SWINGS (ICD-296.99) ESOPHAGEAL STRICTURE (ICD-530.3) CPK, ABNORMAL (ICD-790.5) SHOULDER PAIN, BILATERAL (ICD-719.41) HYPERTENSION, BENIGN ESSENTIAL (ICD-401.1) HYPERCHOLESTEROLEMIA (ICD-272.0) VASECTOMY, HX OF (ICD-V26.52) RHABDOMYOLYSIS (ICD-728.88) CHEST PAIN (ICD-786.50.Marland KitchenMarland KitchenMyoview.. December , 2009.. no ischemia.Marland Kitchen ejection fraction 48% EF 55%  echo... January, 2010... LVH... mild-to-moderate... echo.. January, 2010 Question RV/RA Mass. in the past... redo echo.. January, 2010... no mass seen dilated aortic root... mild... 2010... plan followup 2 years GOUT (ICD-274.9) DIABETES MELLITUS (ICD-250.00)

## 2010-04-11 NOTE — Assessment & Plan Note (Signed)
Summary: Diabetes/Shoulder pain   Vital Signs:  Patient profile:   55 year old male Height:      67 inches Weight:      266 pounds BMI:     41.81 BSA:     2.28 Temp:     97.6 degrees F oral Pulse rate:   68 / minute Pulse rhythm:   regular Resp:     16 per minute BP sitting:   122 / 80  (left arm) Cuff size:   large  Vitals Entered By: Levon Hedger (June 23, 2008 9:25 AM) CC: pt states he wants to beat up someone feels as if he has anxiety or stress or something just not feeling in control and things gets on his nerves real easy, Lipid Management, Depression Is Patient Diabetic? Yes  Pain Assessment Patient in pain? no      CBG Result 140 CBG Device ID B  Does patient need assistance? Functional Status Self care Ambulation Normal   History of Present Illness:  Pt into the office to for follow up on nerves.  Right Shoulder pain - pt did go to neurology for ? biopsy on right shoulder. He reports that it was not done.  This office has not recieved any correspondence from [facility]. Pt is taking naproxe as needed for his shoulders which helps some but does not completely relieve the pain.  He has taken some Neurontin (from a friend) and reports that it did help. No labs done at [facility] per pt report.  Depression History:      The patient presents with symptoms of depression which have been present for greater than two weeks.  Positive alarm features for depression include fatigue (loss of energy).  However, he denies insomnia.        The patient denies that he feels like life is not worth living, denies that he wishes that he were dead, and denies that he has thought about ending his life.        Comments:  Reports lots of stressors from work.  Lipid Management History:      Positive NCEP/ATP III risk factors include male age 53 years old or older, diabetes, HDL cholesterol less than 40, and hypertension.  Negative NCEP/ATP III risk factors include no family history  for ischemic heart disease, non-tobacco-user status, no ASHD (atherosclerotic heart disease), no prior stroke/TIA, no peripheral vascular disease, and no history of aortic aneurysm.        He notes side effects from his lipid-lowering medication.  Comments include: myalgias Pt has not taken simvastatin due to myalgia.       Medications Prior to Update: 1)  Hydrochlorothiazide 25 Mg Tabs (Hydrochlorothiazide) .... Take One Tablet By Mouth Daily 2)  Atenolol 100 Mg Tabs (Atenolol) .... Take One Tablet By Mouth Daily For Blood Pressure 3)  Simvastatin 20 Mg Tabs (Simvastatin) .... Take One Tablet By Mouth At Bedtime *elwaleed Elnour, Md 4)  Lisinopril 20 Mg Tabs (Lisinopril) .... 2 Tablets By Mouth Daily For Blood Pressure 5)  Adult Aspirin Ec Low Strength 81 Mg Tbec (Aspirin) 6)  Hydralazine Hcl 25 Mg Tabs (Hydralazine Hcl) .Marland Kitchen.. 1 Tablet By Mouth Four Times Daily 7)  Naproxen 500 Mg Tabs (Naproxen) .Marland Kitchen.. 1 Tablet By Mouth Two Times A Day As Needed For Joint Pain 8)  Protonix 40 Mg  Tbec (Pantoprazole Sodium) .Marland Kitchen.. 1 Each Day 30 Minutes Before Meal  Allergies: 1)  ! Codeine 2)  ! * Shellfish  Review of  Systems MS:  Complains of joint pain; bil shoulder pain. Psych:  Complains of thoughts of violence; outbursts of anger.  Physical Exam  General:  alert.  obese Head:  normocephalic.   Lungs:  normal breath sounds.   Heart:  normal rate and regular rhythm.   Msk:  active ROM in bil arms though slow Neurologic:  alert & oriented X3.    Diabetes Management Exam:    Foot Exam (with socks and/or shoes not present):       Sensory-Monofilament:          Left foot: normal          Right foot: normal       Inspection:          Left foot: normal          Right foot: normal       Nails:          Left foot: normal          Right foot: normal   Impression & Recommendations:  Problem # 1:  MOOD SWINGS (ICD-296.99) PhQ done spoke with pt about sudden change in moods no meds now pt will  discuss at f/u  Problem # 2:  SHOULDER PAIN, BILATERAL (ICD-719.41)  His updated medication list for this problem includes:    Adult Aspirin Ec Low Strength 81 Mg Tbec (Aspirin)    Naproxen 500 Mg Tabs (Naproxen) .Marland Kitchen... 1 tablet by mouth two times a day as needed for joint pain    Ultram 50 Mg Tabs (Tramadol hcl) .Marland Kitchen... 1 tablet by mouth at night as needed for shoulder pain  Problem # 3:  HYPERTENSION, BENIGN ESSENTIAL (ICD-401.1)  His updated medication list for this problem includes:    Hydrochlorothiazide 25 Mg Tabs (Hydrochlorothiazide) .Marland Kitchen... Take one tablet by mouth daily    Atenolol 100 Mg Tabs (Atenolol) .Marland Kitchen... Take one tablet by mouth daily for blood pressure    Lisinopril 20 Mg Tabs (Lisinopril) .Marland Kitchen... 2 tablets by mouth daily for blood pressure    Hydralazine Hcl 25 Mg Tabs (Hydralazine hcl) .Marland Kitchen... 1 tablet by mouth four times daily  Orders: T-Comprehensive Metabolic Panel (60454-09811) T-CBC w/Diff (91478-29562)  Problem # 4:  HYPERCHOLESTEROLEMIA (ICD-272.0) will check lipids today statin on hold if still elevated will need to start anther med His updated medication list for this problem includes:    Simvastatin 20 Mg Tabs (Simvastatin) .Marland Kitchen... Take one tablet by mouth at bedtime *elwaleed elnour, md  Orders: T-Lipid Profile (13086-57846)  Problem # 5:  DIABETES MELLITUS (ICD-250.00)  His updated medication list for this problem includes:    Lisinopril 20 Mg Tabs (Lisinopril) .Marland Kitchen... 2 tablets by mouth daily for blood pressure    Adult Aspirin Ec Low Strength 81 Mg Tbec (Aspirin)  Orders: T-Comprehensive Metabolic Panel ([id number]) T-CBC w/Diff (24401-02725)  Complete Medication List: 1)  Hydrochlorothiazide 25 Mg Tabs (Hydrochlorothiazide) .... Take one tablet by mouth daily 2)  Atenolol 100 Mg Tabs (Atenolol) .... Take one tablet by mouth daily for blood pressure 3)  Simvastatin 20 Mg Tabs (Simvastatin) .... Take one tablet by mouth at bedtime *elwaleed elnour,  md 4)  Lisinopril 20 Mg Tabs (Lisinopril) .... 2 tablets by mouth daily for blood pressure 5)  Adult Aspirin Ec Low Strength 81 Mg Tbec (Aspirin) 6)  Hydralazine Hcl 25 Mg Tabs (Hydralazine hcl) .Marland Kitchen.. 1 tablet by mouth four times daily 7)  Naproxen 500 Mg Tabs (Naproxen) .Marland Kitchen.. 1 tablet by mouth two times a day as  needed for joint pain 8)  Protonix 40 Mg Tbec (Pantoprazole sodium) .Marland Kitchen.. 1 each day 30 minutes before meal 9)  Ultram 50 Mg Tabs (Tramadol hcl) .Marland Kitchen.. 1 tablet by mouth at night as needed for shoulder pain  Other Orders: TLB-CK Total Only(Creatine Kinase/CPK) (82550-CK)  Lipid Assessment/Plan:      Based on NCEP/ATP III, the patient's risk factor category is "history of diabetes".  From this information, the patient's calculated lipid goals are as follows: Total cholesterol goal is 200; LDL cholesterol goal is 100; HDL cholesterol goal is 40; Triglyceride goal is 150.    Patient Instructions: 1)  Sign a release to get records from [facility] - Neurology clinic 2)  Your cholesterol will be checked today.  You will be informed of the results.  You may need to take cholesterol medication but not the ones you were previously taking. 3)  Follow up in 3 months (July 2010) for diabetes Prescriptions: ULTRAM 50 MG TABS (TRAMADOL HCL) 1 tablet by mouth at night as needed for shoulder pain  #30 x 1   Entered and Authorized by:   [name] FNP   Signed by:   [name] FNP on 06/23/2008   Method used:   Print then Give to Patient   RxID:   [id number] NAPROXEN 500 MG TABS (NAPROXEN) 1 tablet by mouth two times a day as needed for joint pain  #50 x 1   Entered and Authorized by:   [name] FNP   Signed by:   [name] FNP on 06/23/2008   Method used:   Print then Give to Patient   RxID:   [id number]         Last LDL:                                                 113 (03/08/2008 10:00:00 PM)        Diabetic Foot Exam    10-g  (5.07) Semmes-Weinstein Monofilament Test Performed by: Levon Hedger          Right Foot          Left Foot Visual Inspection               Test Control      normal         normal Site 1         normal         normal Site 2         normal         normal Site 3         normal         normal Site 4         normal         normal Site 5         normal         normal Site 6         normal         normal Site 7         normal         normal Site 8         normal         normal Site 9         normal  normal Site 10         normal         normal  Impression      normal         normal  Appended Document: Diabetes/Shoulder pain     Allergies: 1)  ! Codeine 2)  ! * Shellfish   Complete Medication List: 1)  Hydrochlorothiazide 25 Mg Tabs (Hydrochlorothiazide) .... Take one tablet by mouth daily 2)  Atenolol 100 Mg Tabs (Atenolol) .... Take one tablet by mouth daily for blood pressure 3)  Simvastatin 20 Mg Tabs (Simvastatin) .... Take one tablet by mouth at bedtime *elwaleed elnour, md 4)  Lisinopril 20 Mg Tabs (Lisinopril) .... 2 tablets by mouth daily for blood pressure 5)  Adult Aspirin Ec Low Strength 81 Mg Tbec (Aspirin) 6)  Hydralazine Hcl 25 Mg Tabs (Hydralazine hcl) .Marland Kitchen.. 1 tablet by mouth four times daily 7)  Naproxen 500 Mg Tabs (Naproxen) .Marland Kitchen.. 1 tablet by mouth two times a day as needed for joint pain 8)  Protonix 40 Mg Tbec (Pantoprazole sodium) .Marland Kitchen.. 1 each day 30 minutes before meal 9)  Ultram 50 Mg Tabs (Tramadol hcl) .Marland Kitchen.. 1 tablet by mouth at night as needed for shoulder pain  Other Orders: Pneumococcal Vaccine (16109) Admin 1st Vaccine (60454)        Pneumovax Vaccine    Vaccine Type: Pneumovax    Site: left deltoid    Mfr: Merck    Dose: 0.5 ml    Route: IM    Given by: Levon Hedger    Exp. Date: 02/12/2009    Lot #: [id number]    VIS given: 10/08/95 version given June 23, 2008.    ndc  [id number]

## 2010-04-11 NOTE — Progress Notes (Signed)
Summary: Refill  Medications Added COLCHICINE 0.6 MG TABS (COLCHICINE) One tablet by mouth three times a day as needed for gout flare       Phone Note Call from Patient Call back at [facility] Phone [phone]   Summary of Call: PT IS CALLING ANS STATES THAT HE HAD A GOUT FLARE UP ON TUESDAY AND WANTS TO SEE IF HE CAN GET A RX FOR COLCHICINE CALLED INTO THE PHARMACY. STATES HE HAD NEVER GOTTEN A RX FROM HERE HE PREVIOUSLY GOT IT FROM HOSPITAL. ALSO WANTS TO KNOW IF HE CAN GET ANPOTHER RX FOR TRAMADOL. PT USES [facility] BUT HE WANTS THE TRAMADOL TO GO TO [place] [facility]. Initial call taken by: Mikey College CMA,  January 28, 2009 4:33 PM  Follow-up for Phone Call        [facility] tramadol faxed to [facility] pharmacy the pharmacy has also advise this provider that pt did NOT pick up his cholesterol and blood pressure medications yet for the month of November and per the last time he purchased these meds he should have completed his supply.  Remind pt that he needs to take medications as ordered to lower blood pressure and prevent other significant problems Follow-up by: [name] FNP,  January 31, 2009 8:01 AM  Additional Follow-up for Phone Call Additional follow up Details #1::        Alex [name] IS AWARE THAT HIS MEDICINE HAS BEEN SENT TO THE PHARMACIES, AND HE SAYS THAT THE BP AND CHOL MEDS, HE HAS A 3 MONTH  SUPPLY ON THEM. Additional Follow-up by: Leodis Rains,  January 31, 2009 9:29 AM    Additional Follow-up for Phone Call Additional follow up Details #2::    noted. Follow-up by: [name] FNP,  January 31, 2009 1:19 PM  New/Updated Medications: COLCHICINE 0.6 MG TABS (COLCHICINE) One tablet by mouth three times a day as needed for gout flare Prescriptions: ULTRAM 50 MG TABS (TRAMADOL HCL) 1 tablet by mouth at night as needed for shoulder pain  #30 x 1   Entered and Authorized by:   [name] FNP   Signed by:   [name] FNP on  01/31/2009   Method used:   Faxed to ...       [facility] (retail)       [address] [place], [place]  [address]       Ph: [phone]       Fax: [phone]   RxID:   [id number] COLCHICINE 0.6 MG TABS (COLCHICINE) One tablet by mouth three times a day as needed for gout flare  #30 x 0   Entered and Authorized by:   [name] FNP   Signed by:   [name] FNP on 01/31/2009   Method used:   Electronically to        [facility] (retail)       [address]       [place], [place]  [address]       Ph: [phone]       Fax: [phone]   RxID:   [id number]

## 2010-04-11 NOTE — Progress Notes (Signed)
Summary: ABCESS TOOTH  Medications Added PENICILLIN V POTASSIUM 500 MG TABS (PENICILLIN V POTASSIUM) 1 tablet by mouth three times a day for infection       Phone Note Call from Patient Call back at [facility] Phone [phone]   Summary of Call: Jaimie Pippins PT. MR. [place] SAYS THAT HE HAS A TOOTH THAT HAS AN ABCESS IN IT AND ITS BOTHERING HIM, HE SAYS THAT HE WAS HERE YESTERDAY AND FORGOT TO MENTION IT, AND WOULD LIKE FOR [place] TO DO A REFERRAL TO THE DENTAL CLINIC. IF YOU CALL A ANTIBIOTIC IN, CALL TO [facility] ON [address] AND SEE IF THEY HAVE SOMETHING ON THE $4 PLAN. Initial call taken by: Leodis Rains,  June 25, 2008 11:05 AM  Follow-up for Phone Call        forward to N. Daphine Deutscher, FNP Follow-up by: Levon Hedger,  June 25, 2008 12:31 PM  Additional Follow-up for Phone Call Additional follow up Details #1::        rx sent to pharmacy [facility] referral in basket advise pt there is a wait list for the dental clinic Additional Follow-up by: [name] FNP,  June 25, 2008 1:27 PM  New Problems: ABSCESS, TOOTH (ICD-522.5)   Additional Follow-up for Phone Call Additional follow up Details #2::    pt informed. Follow-up by: Levon Hedger,  June 28, 2008 5:01 PM  New Problems: ABSCESS, TOOTH (ICD-522.5) New/Updated Medications: PENICILLIN V POTASSIUM 500 MG TABS (PENICILLIN V POTASSIUM) 1 tablet by mouth three times a day for infection   Prescriptions: PENICILLIN V POTASSIUM 500 MG TABS (PENICILLIN V POTASSIUM) 1 tablet by mouth three times a day for infection  #30 x 0   Entered and Authorized by:   [name] FNP   Signed by:   [name] FNP on 06/25/2008   Method used:   Electronically to        [facility] (retail)       [address]       [place], [place]  [address]       Ph: [phone]       Fax: [phone]   RxID:   [id number]

## 2010-04-11 NOTE — Letter (Signed)
Summary: Lipid Letter  [facility]  [address] [place], [place] [address]   Phone: [phone]  Fax: [phone]    02/23/2009  [name] [address] [place], [place]  [address]  Dear Alex [name]:  We have carefully reviewed your last lipid profile from 02/14/2009 and the results are noted below with a summary of recommendations for lipid management.    Cholesterol:       205     Goal: less than 200   HDL "good" Cholesterol:   33     Goal: greater than 40   LDL "bad" Cholesterol:   124     Goal: less than 100   Triglycerides:       239     Goal: less than 150    Your cholesterol is still high but this is MUCH improved from the previous check.  Continue to take your cholesterol medications as ordered.  Also avoud fried, fatty foods.    Current Medications: 1)    Atenolol 100 Mg Tabs (Atenolol) .... Take one tablet by mouth daily for blood pressure 2)    Lisinopril 20 Mg Tabs (Lisinopril) .... 2 tablets by mouth daily for blood pressure 3)    Adult Aspirin Ec Low Strength 81 Mg Tbec (Aspirin) 4)    Hydralazine Hcl 25 Mg Tabs (Hydralazine hcl) .... One tablet by mouth two times a day 5)    Naproxen 500 Mg Tabs (Naproxen) .... Hold 6)    Protonix 40 Mg  Tbec (Pantoprazole sodium) .Marland Kitchen.. 1 each day 30 minutes before meal 7)    Ultram 50 Mg Tabs (Tramadol hcl) .Marland Kitchen.. 1 tablet by mouth at night as needed for shoulder pain 8)    Lovaza 1 Gm Caps (Omega-3-acid ethyl esters) .... 2 capsules by mouth two times a day 9)    Gemfibrozil 600 Mg Tabs (Gemfibrozil) .Marland Kitchen.. 1 tablet by mouth 30 minutes before breakfast and 30 minutes before dinner 10)    Allopurinol 300 Mg Tabs (Allopurinol) .... One tablet by mouth daily for gout **pharmacy - note dose change** 11)    Norvasc 5 Mg Tabs (Amlodipine besylate) .... One tablet by mouth daily for blood pressure 12)    Zoloft 50 Mg Tabs (Sertraline hcl) .... 1/2 tablet by mouth nightly for 1 week then increase to 1 tablet by mouth  nightly 13)    Colchicine 0.6 Mg Tabs (Colchicine) .... One tablet by mouth three times a day as needed for gout flare 14)    Meloxicam 15 Mg Tabs (Meloxicam) .... One tablet by mouth daily for feet  If you have any questions, please call. We appreciate being able to work with you.   Sincerely,    [facility] [name] FNP

## 2010-04-11 NOTE — Miscellaneous (Signed)
Summary: omeprazole rx   

## 2010-04-11 NOTE — Letter (Signed)
Summary: EGD WITH FOREIGN BODY REMOVAL  EGD WITH FOREIGN BODY REMOVAL   Imported By: Arta Bruce 05/28/2008 11:34:26  _____________________________________________________________________  External Attachment:    Type:   Image     Comment:   External Document

## 2010-04-11 NOTE — Progress Notes (Signed)
Summary: ? ABOUT HIS SICKNESS   Phone Note Call from Patient Call back at [facility] Phone [phone]   Summary of Call: MARTIN PT. MR Dzikowski WANTS TO ASK SOME QUESTIONS ABOUT WHAT YOU SAID HE HAS, AND HE WANTS TO KNOW WHAT CAN HE DO,. HE WOKE UP THIS MORNING IN A LOT OF PAIN AND HE FEEL SO WEAK, BUT HE DONT THINK HE CAN MAKE IT. Initial call taken by: Leodis Rains,  October 21, 2009 11:08 AM  Follow-up for Phone Call        MR Ramella CALLED AND  SAYS THAT  HE TOOK THE TRAMADOL AND IT MADE HIM SICK AS A DOG. CAN YOU CALL SOMETHING IN DIFFERENT. Follow-up by: Leodis Rains,  October 21, 2009 2:31 PM  Additional Follow-up for Phone Call Additional follow up Details #1::        If tramadol is making pt sick, he can take Tylenol Extra Strength which is available over the counter.  He can alternate this with aleve or ibuprofen (also over the counter) if he chooses. Regarding his dx - this is unchanged from before.  Pt did NOT have an elevated BNP to suggest fluid around his heart or CHF.  His CK and CK-MB were still elevated (and they were previously when he was referred to cardiology in October for stress test which was negative). These elevations will likely always be the case for the pt.  He should drink plenty fluids. There is nothing to make this "go away" as with many illnesses some days will be better than others  Additional Follow-up by: [name] FNP,  October 21, 2009 4:34 PM    Additional Follow-up for Phone Call Additional follow up Details #2::    Advised of provider's response and recommendations -- encouraged to refrain from drinking soda, but encouraged to drink other non-carbonated fluids.  Verbalized understanding.  Dutch Quint RN  October 21, 2009 4:45 PM

## 2010-04-11 NOTE — Assessment & Plan Note (Signed)
Summary: 3 MONTH FU///KT   Vital Signs:  Patient profile:   55 year old male Weight:      265 pounds BMI:     41.65 BSA:     2.28 Temp:     97.5 degrees F oral Pulse rate:   80 / minute Pulse rhythm:   regular Resp:     20 per minute BP sitting:   119 / 79  (left arm) Cuff size:   large  Vitals Entered By: Levon Hedger (October 11, 2008 9:07 AM) CC: feels yucky..feels numbness in the body thinks it may be a summer cold, just does'nt feel well Is Patient Diabetic? No Pain Assessment Patient in pain? no       Does patient need assistance? Functional Status Self care Ambulation Normal   CC:  feels yucky..feels numbness in the body thinks it may be a summer cold and just does'nt feel well.  History of Present Illness: 55 yr old male in for follow up on HTN, DM, anxiety.  He has concern that his bpis too low and making him feel lethargic.  He is not taking Hydralazine 4 times a day, only at bedtime. His BP today isn 119/78.  He said he had talked to Ms. Daphine Deutscher about his anxiety and now wants to try a medication for this.  He is shorttempered and doesn't want to be this way.  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  Last AIC was 5.9 severla months ago and not checking blood sugar regularly.  He brings a bag full of medicines in that have some duplicate empty botles and many are not up to date for regular ususge.  He was cramping with HCTZ and not taking that med at this time  Allergies (verified): 1)  ! Codeine 2)  ! * Shellfish PMH-FH-SH reviewed-no changes except otherwise noted, PMH-FH-SH reviewed for relevance  Social History: tobacco - none ETOH - none Drug use - none 4 children - healthy Married  Physical Exam  General:  Well-developed,well-nourished,in no acute distress; alert,appropriate and cooperative throughout examination Lungs:  Normal respiratory  effort, chest expands symmetrically. Lungs are clear to auscultation, no crackles or wheezes. Heart:  Normal rate and regular rhythm. S1 and S2 normal without gallop, murmur, click, rub or other extra sounds. Abdomen:  obese, non tender Skin:  Intact without suspicious lesions or rashes Psych:  Cognition and judgment appear intact. Alert and cooperative with normal attention span and concentration. No apparent delusions, illusions, hallucinations   Impression & Recommendations:  Problem # 1:  DIABETES MELLITUS (ICD-250.00)  His updated medication list for this problem includes:    Lisinopril 20 Mg Tabs (Lisinopril) .Marland Kitchen... 2 tablets by mouth daily for blood pressure    Adult Aspirin Ec Low Strength 81 Mg Tbec (Aspirin) Check AIC and reviewed diet and exercise  Orders: Capillary Blood Glucose/CBG (16109) Hgb A1C (60454UJ) T-Comprehensive Metabolic Panel (81191-47829) T-Uric Acid (Blood) (56213-08657)  Problem # 2:  GOUT (ICD-274.9)  His updated medication list for this problem includes:    Naproxen 500 Mg Tabs (Naproxen) .Marland Kitchen... 1 tablet by mouth two times a day as needed for joint pain    Allopurinol 100 Mg Tabs (Allopurinol) .Marland Kitchen... 1 tablet by mouth daily for gout  Check uric acid level and reviewed daily dosing of medication  Orders: T-Uric Acid (Blood) [phone])  Problem # 3:  HYPERTENSION, BENIGN ESSENTIAL (ICD-401.1)  His updated medication list for this problem includes:    Atenolol 100 Mg Tabs (Atenolol) .Marland Kitchen... Take one tablet by mouth daily for blood pressure    Lisinopril 20 Mg Tabs (Lisinopril) .Marland Kitchen... 2 tablets by mouth daily for blood pressure    Hydralazine Hcl 25 Mg Tabs (Hydralazine hcl) .Marland Kitchen... 1 tablet by mouth four times daily    Norvasc 5 Mg Tabs (Amlodipine besylate) ..... One tablet by mouth daily for blood pressure Reccomend only taking Hydralazine at hs for now since he is not taking QID anyway.  I is increasing his lethargy  Problem # 4:  INSOMNIA  UNSPECIFIED (ICD-780.52) Rec stop eating at hs, eat earlier in evening Given Hydroxzine 25mg  to take at hs  Problem # 5:  INSOMNIA UNSPECIFIED (ICD-780.52) Assessment: New Start Zoloft 25mg  and increased as tolerated. Rec counseling if not effected Review side effects  Complete Medication List: 1)  Atenolol 100 Mg Tabs (Atenolol) .... Take one tablet by mouth daily for blood pressure 2)  Lisinopril 20 Mg Tabs (Lisinopril) .... 2 tablets by mouth daily for blood pressure 3)  Adult Aspirin Ec Low Strength 81 Mg Tbec (Aspirin) 4)  Hydralazine Hcl 25 Mg Tabs (Hydralazine hcl) .Marland Kitchen.. 1 tablet by mouth four times daily 5)  Naproxen 500 Mg Tabs (Naproxen) .Marland Kitchen.. 1 tablet by mouth two times a day as needed for joint pain 6)  Protonix 40 Mg Tbec (Pantoprazole sodium) .Marland Kitchen.. 1 each day 30 minutes before meal 7)  Ultram 50 Mg Tabs (Tramadol hcl) .Marland Kitchen.. 1 tablet by mouth at night as needed for shoulder pain 8)  Lovaza 1 Gm Caps (Omega-3-acid ethyl esters) .... 2 capsules by mouth two times a day 9)  Penicillin V Potassium 500 Mg Tabs (Penicillin v potassium) .Marland Kitchen.. 1 tablet by mouth three times a day for infection 10)  Gemfibrozil 600 Mg Tabs (Gemfibrozil) .Marland Kitchen.. 1 tablet by mouth 30 minutes before breakfast and 30 minutes before dinner 11)  Allopurinol 100 Mg Tabs (Allopurinol) .Marland Kitchen.. 1 tablet by mouth daily for gout 12)  Norvasc 5 Mg Tabs (Amlodipine besylate) .... One tablet by mouth daily for blood pressure 13)  Vistaril 25 Mg Caps (Hydroxyzine pamoate) .... One at bedtime for sleep 14)  Zoloft 50 Mg Tabs (Sertraline hcl) .... One half pill daily for one week then increase to one pill daily  Patient Instructions: 1)  Please schedule a follow-up appointment in 1 month.   Prescriptions: ZOLOFT 50 MG TABS (SERTRALINE HCL) one half pill daily for one week then increase to one pill daily  #30 x 1   Entered and Authorized by:   Willis Modena MSN, FNP-C   Signed by:   Willis Modena MSN, FNP-C on 10/11/2008    Method used:   Print then Give to Patient   RxID:   [id number] VISTARIL 25 MG CAPS (HYDROXYZINE PAMOATE) one at bedtime for sleep  #30 x 0   Entered and Authorized by:   Willis Modena MSN, FNP-C   Signed by:   Willis Modena MSN, FNP-C on 10/11/2008   Method used:   Print then Give to Patient   RxID:   [id number]   Appended Document: 3 MONTH FU///KT  Laboratory Results   Blood Tests   Date/Time Received: October 11, 2008 11:59 AM  Date/Time Reported: October 11, 2008 11:59 AM   HGBA1C: 6.2%   (Normal Range: Non-Diabetic - 3-6%   Control Diabetic - 6-8%)

## 2010-04-11 NOTE — Letter (Signed)
Summary: CALL-A-NURSE  CALL-A-NURSE   Imported By: Leodis Rains 07/29/2008 16:59:51  _____________________________________________________________________  External Attachment:    Type:   Image     Comment:   External Document

## 2010-04-11 NOTE — Progress Notes (Signed)
Summary: FYI - Pending procedure   Phone Note Call from Patient Call back at [facility] Phone [phone]   Summary of Call: Alex [name] Alex [name] DROPPED OFF A COPY OF HIS PATIENT DISCHARGE INSTRUCTION SHEET AS TO WHEN HE IS SCHEDULE TO HAVE HIS SURGERY. ITS IN THE BASKET FOR SHEILA TO SCAN INTO CHART. Initial call taken by: Leodis Rains,  May 12, 2008 12:55 PM  Follow-up for Phone Call        forward to N. Martin,FNP Follow-up by: Levon Hedger,  May 12, 2008 12:57 PM  Additional Follow-up for Phone Call Additional follow up Details #1::        noted. Additional Follow-up by: [name] FNP,  May 13, 2008 8:13 AM

## 2010-04-11 NOTE — Letter (Signed)
Summary: Lipid Letter  [facility]  [address] [place], [place] [address]   Phone: [phone]  Fax: [phone]    11/29/2008  [name] [address] [place], [place]  [address]  Dear Alex [name]:  We have carefully reviewed your last lipid profile from 11/26/2008 and the results are noted below with a summary of recommendations for lipid management.    Cholesterol:       230     Goal: less than 200   HDL "good" Cholesterol:   30     Goal: greater than 40   LDL "bad" Cholesterol:   146     Goal: less than 100   Triglycerides:       268     Goal: less than 150    Your cholesterol is still elevated but MUCH better than previous check.  Please continue to take both lovaza and gembibrozil for your cholesterol.  You should also maintain a low fat (avoid fried foods) diet. Exercise, such as walking 15-20 minutes per day is also helpful.   It is important to keep cholesterol low to keep your risk for heart attack and stroke down.     Current Medications: 1)    Atenolol 100 Mg Tabs (Atenolol) .... Take one tablet by mouth daily for blood pressure 2)    Lisinopril 20 Mg Tabs (Lisinopril) .... 2 tablets by mouth daily for blood pressure 3)    Adult Aspirin Ec Low Strength 81 Mg Tbec (Aspirin) 4)    Hydralazine Hcl 25 Mg Tabs (Hydralazine hcl) .Marland Kitchen.. 1 tablet by mouth four times daily 5)    Naproxen 500 Mg Tabs (Naproxen) .Marland Kitchen.. 1 tablet by mouth two times a day as needed for joint pain 6)    Protonix 40 Mg  Tbec (Pantoprazole sodium) .Marland Kitchen.. 1 each day 30 minutes before meal 7)    Ultram 50 Mg Tabs (Tramadol hcl) .Marland Kitchen.. 1 tablet by mouth at night as needed for shoulder pain 8)    Lovaza 1 Gm Caps (Omega-3-acid ethyl esters) .... 2 capsules by mouth two times a day 9)    Gemfibrozil 600 Mg Tabs (Gemfibrozil) .Marland Kitchen.. 1 tablet by mouth 30 minutes before breakfast and 30 minutes before dinner 10)    Allopurinol 300 Mg Tabs (Allopurinol) .... One tablet by mouth daily for gout  **pharmacy - note dose change** 11)    Norvasc 5 Mg Tabs (Amlodipine besylate) .... One tablet by mouth daily for blood pressure  If you have any questions, please call. We appreciate being able to work with you.   Sincerely,    [facility] [name] FNP

## 2010-04-11 NOTE — Letter (Signed)
Summary: *HSN Results Follow up  [facility]  [address] [place], [place] [address]   Phone: [phone]  Fax: [phone]      12/01/2008   Alex [name] [address] [place], [place]  [address]   Dear  Alex [name],                            ____S.Drinkard,FNP   ____D. Gore,FNP       ____B. McPherson,MD   ____V. Rankins,MD    ____E. Mulberry,MD    _X___N. Daphine Deutscher, FNP  ____D. Reche Dixon, MD    ____K. Philipp Deputy, MD    ____Other     This letter is to inform you that your recent test(s):  RETASURE EYE SCREENING    ____X___ is within acceptable limits     Comments: Your recent eye screening was negative for cataracts or glaucoma.         _________________________________________________________ If you have any questions, please contact our office [phone].                    Sincerely,    Alex Prom FNP [facility]

## 2010-04-11 NOTE — Letter (Signed)
Summary: *HSN Results Follow up  [facility]  [address] [place], [place] [address]   Phone: [phone]  Fax: [phone]      11/12/2008   Alex [name] [address] [place], [place]  [address]   Dear  [name],                            ____S.Drinkard,FNP   ____D. Gore,FNP       ____B. McPherson,MD   ____V. Rankins,MD    ____E. Mulberry,MD    _X___N. Daphine Deutscher, FNP  ____D. Reche Dixon, MD    ____K. Philipp Deputy, MD    ____Other     This letter is to inform you that your recent test(s):  _______Pap Smear    ___X____Lab Test     _______X-ray    __X____ is within acceptable limits  _______ requires a medication change  _______ requires a follow-up lab visit  _______ requires a follow-up visit with your provider   Comments: Your uric acid was still elevated during your recent office visit.  This may cause some gout problems.  You should be taking your allopurinol daily.       _________________________________________________________ If you have any questions, please contact our office [phone].                    Sincerely,    Alex Prom FNP [facility]

## 2010-04-11 NOTE — Letter (Signed)
Summary: [facility] 11/23/2008  [facility] 11/23/2008   Imported By: Percell Miller 11/30/2008 14:31:59  _____________________________________________________________________  External Attachment:    Type:   Image     Comment:   External Document  Appended Document: [facility] 11/23/2008     Clinical Lists Changes  Observations: Added new observation of EYES COMMENT: 11/2009 (12/01/2008 8:24) Added new observation of EYE EXAM BY: [facility] (11/23/2008 8:25) Added new observation of DMEYEEXMRES: normal (11/23/2008 8:25) Added new observation of DIAB EYE EX: normal (11/23/2008 8:25)       Diabetes Management History:      The patient is a 55 years old male who comes in for evaluation of DM Type 2.  He is not checking home blood sugars.  He says that he is not exercising regularly.    Diabetes Management Exam:    Eye Exam:       Eye Exam done elsewhere          Date: 11/23/2008          Results: normal          Done by: [facility]  Diabetes Management Assessment/Plan:      The following lipid goals have been established for the patient: Total cholesterol goal of 200; LDL cholesterol goal of 100; HDL cholesterol goal of 40; Triglyceride goal of 150.  His blood pressure goal is < 130/80.

## 2010-04-11 NOTE — Progress Notes (Signed)
Summary: cramping  Medications Added HYDRALAZINE HCL 25 MG TABS (HYDRALAZINE HCL) 1 tablet by mouth four times daily       Phone Note Call from Patient Call back at [facility] Phone [phone]   Caller: Patient Summary of Call: The pt is requesting for his lab results and also his body is cramping (comes and go). FNP Daphine Deutscher  Initial call taken by: Manon Hilding,  March 15, 2008 9:32 AM  Follow-up for Phone Call        pt also state that he has been having cramping for 3-4 days in his side, stomach and hands he says that it feels like muscle cramps. no vomiting or diarrhea.  I told him that I would get this to the provider. Follow-up by: Levon Hedger,  March 15, 2008 11:55 AM  Additional Follow-up for Phone Call Additional follow up Details #1::        Ck during last office visit was still elevated labs faxed to cardiology for review Additional Follow-up by: [name] FNP,  March 17, 2008 12:41 PM    New/Updated Medications: HYDRALAZINE HCL 25 MG TABS (HYDRALAZINE HCL) 1 tablet by mouth four times daily

## 2010-04-11 NOTE — Assessment & Plan Note (Signed)
Summary: Chest pain   Vital Signs:  Patient profile:   55 year old male Weight:      269.5 pounds Temp:     97.9 degrees F oral Pulse rate:   62 / minute Pulse rhythm:   regular Resp:     16 per minute BP sitting:   148 / 89  (left arm) Cuff size:   large  Vitals Entered By: Levon Hedger (December 29, 2008 10:01 AM) CC: sob, dicomfort in his chest, breaking out in a sweat, x 1 day and the day before, had headache over the weekend and said that cholesterol medicine give him headache...pt states he feels yucky, Hypertension Management, Lipid Management, Abdominal Pain, Depression Is Patient Diabetic? No Pain Assessment Patient in pain? no      CBG Result 147 CBG Device ID B  Does patient need assistance? Functional Status Self care Ambulation Normal   CC:  sob, dicomfort in his chest, breaking out in a sweat, x 1 day and the day before, had headache over the weekend and said that cholesterol medicine give him headache...pt states he feels yucky, Hypertension Management, Lipid Management, Abdominal Pain, and Depression.  History of Present Illness:  Pt into the office for sick visit. States that on yesturday he had some chest discomfort, weakness, sweaty and fatigue, nausea. He was at work during the time and symptoms caused him to leave work. Pt took an aspirin while at work. He continued on to his next stop and then pt's boss took his load and he drove home. Pt did not go to the ER.   All medications present today with pt.  Depression History:      Positive alarm features for depression include fatigue (loss of energy).        The patient denies that he feels like life is not worth living, denies that he wishes that he were dead, and denies that he has thought about ending his life.        Comments:  Pt is not taking zoloft as ordered.  Reports that it makes him forget when he takes it during the day.  Dyspepsia History:      A prior EGD has been done which showed  no evidence for moderate or severe esophagitis or Barrett's esophagus.    Hypertension History:      He complains of chest pain, but denies headache and palpitations.  He did not take the hydralazine HCL 25mg  by mouth daily instead of four times per day as ordered.        Positive major cardiovascular risk factors include male age 46 years old or older, diabetes, hyperlipidemia, and hypertension.  Negative major cardiovascular risk factors include negative family history for ischemic heart disease and non-tobacco-user status.        Further assessment for target organ damage reveals no history of ASHD, cardiac end-organ damage (CHF/LVH), stroke/TIA, peripheral vascular disease, renal insufficiency, or hypertensive retinopathy.    Lipid Management History:      Positive NCEP/ATP III risk factors include male age 43 years old or older, diabetes, HDL cholesterol less than 40, and hypertension.  Negative NCEP/ATP III risk factors include no family history for ischemic heart disease, non-tobacco-user status, no ASHD (atherosclerotic heart disease), no prior stroke/TIA, no peripheral vascular disease, and no history of aortic aneurysm.        The patient states that he does not know about the "Therapeutic Lifestyle Change" diet.  He expresses no side effects from his  lipid-lowering medication.  The patient notes symptoms to suggest myopathy or liver disease.  Comments include: Pt had elevated LFT's with elevation in CK so statin d/c.  Comments: S/p cardiac event almost 1 year ago.       Habits & Providers  Alcohol-Tobacco-Diet     Alcohol drinks/day: 0     Tobacco Status: quit     Year Quit: 3-4 years ago  Exercise-Depression-Behavior     Does Patient Exercise: no     Drug Use: no     Seat Belt Use: 100     Sun Exposure: occasionally  Allergies: 1)  ! Codeine 2)  ! * Shellfish  Review of Systems General:  Complains of fatigue. CV:  Denies chest pain or discomfort. Resp:  Denies  cough. GI:  Complains of nausea; denies abdominal pain and vomiting.  Physical Exam  General:  alert.   Head:  normocephalic.   Lungs:  normal breath sounds.   Heart:  normal rate and regular rhythm.   Abdomen:  normal bowel sounds.   Msk:  normal ROM.   Neurologic:  alert & oriented X3.     Impression & Recommendations:  Problem # 1:  HYPERTENSION, BENIGN ESSENTIAL (ICD-401.1) advised pt to take meds as ordered EKG no changes His updated medication list for this problem includes:    Atenolol 100 Mg Tabs (Atenolol) .Marland Kitchen... Take one tablet by mouth daily for blood pressure    Lisinopril 20 Mg Tabs (Lisinopril) .Marland Kitchen... 2 tablets by mouth daily for blood pressure    Hydralazine Hcl 25 Mg Tabs (Hydralazine hcl) .Marland Kitchen... 1 tablet by mouth four times daily    Norvasc 5 Mg Tabs (Amlodipine besylate) ..... One tablet by mouth daily for blood pressure  Problem # 2:  CHEST PAIN (ICD-786.50) will order stat labs will order stress test per cardiology Orders: EKG w/ Interpretation (93000) T-CK Total ([address]-60454) TLB-CK-MB (Creatine Kinase MB) (82553-CKMB) T-Troponin I (09811-91478) Cardiology Other (Cardiology Other)  Problem # 3:  MOOD SWINGS (ICD-296.99) pHQ-9 score = 17 advised pt to re-start zoloft but take it at night  Problem # 4:  DIABETES MELLITUS (ICD-250.00) stable His updated medication list for this problem includes:    Lisinopril 20 Mg Tabs (Lisinopril) .Marland Kitchen... 2 tablets by mouth daily for blood pressure    Adult Aspirin Ec Low Strength 81 Mg Tbec (Aspirin)  Orders: Capillary Blood Glucose/CBG (29562)  Problem # 5:  NEED PROPHYLACTIC VACCINATION&INOCULATION FLU (ICD-V04.81) indication: htn, flu  Complete Medication List: 1)  Atenolol 100 Mg Tabs (Atenolol) .... Take one tablet by mouth daily for blood pressure 2)  Lisinopril 20 Mg Tabs (Lisinopril) .... 2 tablets by mouth daily for blood pressure 3)  Adult Aspirin Ec Low Strength 81 Mg Tbec (Aspirin) 4)  Hydralazine Hcl  25 Mg Tabs (Hydralazine hcl) .Marland Kitchen.. 1 tablet by mouth four times daily 5)  Naproxen 500 Mg Tabs (Naproxen) .Marland Kitchen.. 1 tablet by mouth two times a day as needed for joint pain 6)  Protonix 40 Mg Tbec (Pantoprazole sodium) .Marland Kitchen.. 1 each day 30 minutes before meal 7)  Ultram 50 Mg Tabs (Tramadol hcl) .Marland Kitchen.. 1 tablet by mouth at night as needed for shoulder pain 8)  Lovaza 1 Gm Caps (Omega-3-acid ethyl esters) .... 2 capsules by mouth two times a day 9)  Gemfibrozil 600 Mg Tabs (Gemfibrozil) .Marland Kitchen.. 1 tablet by mouth 30 minutes before breakfast and 30 minutes before dinner 10)  Allopurinol 300 Mg Tabs (Allopurinol) .... One tablet by mouth daily for gout **  pharmacy - note dose change** 11)  Norvasc 5 Mg Tabs (Amlodipine besylate) .... One tablet by mouth daily for blood pressure  Other Orders: Flu Vaccine 51yrs + (27253) Admin 1st Vaccine (66440) Admin 1st Vaccine [facility]) [phone])  Hypertension Assessment/Plan:      The patient's hypertensive risk group is category C: Target organ damage and/or diabetes.  His calculated 10 year risk of coronary heart disease is 27 %.  Today's blood pressure is 148/89.  His blood pressure goal is < 130/80.  Lipid Assessment/Plan:      Based on NCEP/ATP III, the patient's risk factor category is "history of diabetes".  The patient's lipid goals are as follows: Total cholesterol goal is 200; LDL cholesterol goal is 100; HDL cholesterol goal is 40; Triglyceride goal is 150.    Patient Instructions: 1)  Zolft - Take 1/2 tablet by mouth nightly for 1 weekt hen increase to 1 whole tablet at night. 2)  Take your Hydralazine 25mg  by mouth two times a day (once during the day and at night) 3)  Your EKG looks good. 4)  Will check stat labs.  Follow up in 5 days.   Influenza Vaccine    Vaccine Type: Fluvax 3+    Site: left deltoid    Mfr: Sanofi Pasteur    Dose: 0.5 ml    Route: IM    Given by: Levon Hedger    Exp. Date: 09/08/2009    Lot #: [id number]    VIS given: 10/03/06  version given December 29, 2008.  Flu Vaccine Consent Questions    Do you have a history of severe allergic reactions to this vaccine? no    Any prior history of allergic reactions to egg and/or gelatin? no    Do you have a sensitivity to the preservative Thimersol? no    Do you have a past history of Guillan-Barre Syndrome? no    Do you currently have an acute febrile illness? no    Have you ever had a severe reaction to latex? no    Vaccine information given and explained to patient? yes    ndc [id number]   EKG  Procedure date:  12/29/2008  Findings:      sinus brady with 1st degree AV block

## 2010-04-11 NOTE — Assessment & Plan Note (Signed)
Summary: Blood in stool   Vital Signs:  Patient profile:   55 year old male Height:      67 inches Weight:      276 pounds BMI:     43.38 Temp:     97.4 degrees F Pulse rate:   74 / minute Pulse rhythm:   regular Resp:     16 per minute BP sitting:   172 / 113  (left arm) Cuff size:   large  Vitals Entered By: Arthor Captain (June 20, 2009 9:04 AM)  Nutrition Counseling: Patient's BMI is greater than 25 and therefore counseled on weight management options.  Serial Vital Signs/Assessments:  Time      Position  BP       Pulse  Resp  Temp     By 9:45 AM             158/102                        [name] FNP  CC: blood in stool, Hypertension Management, Lipid Management Is Patient Diabetic? Yes Pain Assessment Patient in pain? no      CBG Result 165  Does patient need assistance? Functional Status Self care Ambulation Normal Comments Noticed blood in BM last week, lasted for 3 weeks.  Seems to happen about every 6 mos, for last 5 years.  Has not had a colonoscopy. Has not been taking his cholesterol medication because he complains that it makes him sick.(Gemfibrozil, Lovaza).   CC:  blood in stool, Hypertension Management, and Lipid Management.  History of Present Illness:  Pt into the office for follow up.  Pt did NOT bring his medications into the office today. Pharmacy sheet reviewed and pt has picked up all his medications EXCEPT the cholesterol medidations - gemfibrozil and lovaza  Bowel problems - 3 consecutive days on last week pt reports blood both in fecal contents and on toilet  tissue with wiping. Reports that he has this problem at least every 6 months and then self resolves. Known dx of hemorrhoids but denies any straining Eats fiber foods such as oatmeal but not much else Never had a colonscopy No family hx of colon cancer   Obesity - BMI 43 pt has added 11 pounds since his last visit in 02/2009.  Pt admits that he is eating more than usualy  and his activity level has decreased.  Diabetes Management History:      The patient is a 55 years old male who comes in for evaluation of DM Type 2.  He has not been enrolled in the "Diabetic Education Program".  He states understanding of dietary principles but he is not following the appropriate diet.  No sensory loss is reported.  Self foot exams are not being performed.  He is not checking home blood sugars.  He says that he is not exercising regularly.        Hypoglycemic symptoms are not occurring.  No hyperglycemic symptoms are reported.  Other comments include: no current medications - diet management.    Hypertension History:      He denies headache, chest pain, and palpitations.  He notes no problems with any antihypertensive medication side effects.  Pt reports that he has taken his BP meds yet today.        Positive major cardiovascular risk factors include male age 55 years old or older, diabetes, hyperlipidemia, and hypertension.  Negative major cardiovascular risk factors  include negative family history for ischemic heart disease and non-tobacco-user status.        Positive history for target organ damage include peripheral vascular disease.  Further assessment for target organ damage reveals no history of ASHD, cardiac end-organ damage (CHF/LVH), stroke/TIA, renal insufficiency, or hypertensive retinopathy.    Lipid Management History:      Positive NCEP/ATP III risk factors include male age 106 years old or older, diabetes, HDL cholesterol less than 40, hypertension, and peripheral vascular disease.  Negative NCEP/ATP III risk factors include no family history for ischemic heart disease, non-tobacco-user status, no ASHD (atherosclerotic heart disease), no prior stroke/TIA, and no history of aortic aneurysm.        The patient states that he does not know about the "Therapeutic Lifestyle Change" diet.  The patient does not know about adjunctive measures for cholesterol lowering.   Comments include: "I got off the medications because it was making me sick"  C/o fatigue and decreased energy.  Pt reports that he felt better after stopping the cholesterol medications.      Habits & Providers  Alcohol-Tobacco-Diet     Alcohol drinks/day: 0     Tobacco Status: never     Year Quit: 3-4 years ago  Exercise-Depression-Behavior     Does Patient Exercise: no     Have you felt down or hopeless? no     Have you felt little pleasure in things? no     Depression Counseling: not indicated; screening negative for depression     Drug Use: no     Seat Belt Use: 100     Sun Exposure: occasionally  Allergies (verified): 1)  ! Codeine 2)  ! * Shellfish  Review of Systems CV:  Denies chest pain or discomfort. Resp:  Denies cough. GI:  Complains of bloody stools, constipation, and hemorrhoids; denies abdominal pain, nausea, and vomiting.  Physical Exam  General:  alert.   Head:  normocephalic.   Eyes:  glasses Lungs:  normal breath sounds.   Heart:  normal rate and regular rhythm.   Msk:  up to the exam table Neurologic:  alert & oriented X3.   Psych:  Oriented X3.     Impression & Recommendations:  Problem # 1:  HYPERTENSION, BENIGN ESSENTIAL (ICD-401.1) BP is very elevated today Pt JUST took meds before this visit so will recheck next time  if still up will increase norvasc His updated medication list for this problem includes:    Atenolol 100 Mg Tabs (Atenolol) .Marland Kitchen... Take one tablet by mouth daily for blood pressure    Lisinopril 40 Mg Tabs (Lisinopril) ..... One tablet by mouth daily    Hydralazine Hcl 25 Mg Tabs (Hydralazine hcl) ..... One tablet by mouth two times a day    Norvasc 5 Mg Tabs (Amlodipine besylate) ..... One tablet by mouth daily for blood pressure  Problem # 2:  BLOOD IN STOOL (ICD-578.1)  no hx of colonscopy will refer to GI for testing particularly given blood in stool known hx of hemorrhoids - encouraged pt to add fiber in his  diet  Orders: Gastroenterology Referral (GI)  Problem # 3:  HYPERCHOLESTEROLEMIA (ICD-272.0) pt has STOPPED taking his medications - against the advise of this provider reviewed with pt the risk for heart attack and stroke given his extremely elevated lipid panel in the past  unable to check today as pt is not fasting - but will have pt return for check His updated medication list for this problem  includes:    Lovaza 1 Gm Caps (Omega-3-acid ethyl esters) .Marland Kitchen... 2 capsules by mouth two times a day    Gemfibrozil 600 Mg Tabs (Gemfibrozil) .Marland Kitchen... 1 tablet by mouth 30 minutes before breakfast and 30 minutes before dinner  Problem # 4:  DIABETES MELLITUS (ICD-250.00) Hba1c = 6.6 today no current meds, monitoring with diet and exercise advise pt that if he continues to gain weight will have to add medications His updated medication list for this problem includes:    Lisinopril 40 Mg Tabs (Lisinopril) ..... One tablet by mouth daily    Adult Aspirin Ec Low Strength 81 Mg Tbec (Aspirin)  Orders: Capillary Blood Glucose/CBG (82948) Hemoglobin A1C (83036)  Complete Medication List: 1)  Atenolol 100 Mg Tabs (Atenolol) .... Take one tablet by mouth daily for blood pressure 2)  Lisinopril 40 Mg Tabs (Lisinopril) .... One tablet by mouth daily 3)  Adult Aspirin Ec Low Strength 81 Mg Tbec (Aspirin) 4)  Hydralazine Hcl 25 Mg Tabs (Hydralazine hcl) .... One tablet by mouth two times a day 5)  Naproxen 500 Mg Tabs (Naproxen) .... Hold 6)  Protonix 40 Mg Tbec (Pantoprazole sodium) .Marland Kitchen.. 1 each day 30 minutes before meal 7)  Ultram 50 Mg Tabs (Tramadol hcl) .... One tablet by mouth two times a day as needed for pain 8)  Lovaza 1 Gm Caps (Omega-3-acid ethyl esters) .... 2 capsules by mouth two times a day 9)  Gemfibrozil 600 Mg Tabs (Gemfibrozil) .Marland Kitchen.. 1 tablet by mouth 30 minutes before breakfast and 30 minutes before dinner 10)  Allopurinol 300 Mg Tabs (Allopurinol) .... One tablet by mouth daily for  gout **pharmacy - note dose change** 11)  Norvasc 5 Mg Tabs (Amlodipine besylate) .... One tablet by mouth daily for blood pressure 12)  Zoloft 50 Mg Tabs (Sertraline hcl) .... 1/2 tablet by mouth nightly for 1 week then increase to 1 tablet by mouth nightly 13)  Colchicine 0.6 Mg Tabs (Colchicine) .... One tablet by mouth three times a day as needed for gout flare 14)  Meloxicam 15 Mg Tabs (Meloxicam) .... One tablet by mouth daily for feet  Diabetes Management Assessment/Plan:      The following lipid goals have been established for the patient: Total cholesterol goal of 200; LDL cholesterol goal of 100; HDL cholesterol goal of 40; Triglyceride goal of 150.  His blood pressure goal is < 130/80.    Hypertension Assessment/Plan:      The patient's hypertensive risk group is category C: Target organ damage and/or diabetes.  His calculated 10 year risk of coronary heart disease is 33 %.  Today's blood pressure is 172/113.  His blood pressure goal is < 130/80.  Lipid Assessment/Plan:      Based on NCEP/ATP III, the patient's risk factor category is "history of coronary disease, peripheral vascular disease, cerebrovascular disease, or aortic aneurysm along with either diabetes, current smoker, or LDL > 130 plus HDL < 40 plus triglycerides > 200".  The patient's lipid goals are as follows: Total cholesterol goal is 200; LDL cholesterol goal is 100; HDL cholesterol goal is 40; Triglyceride goal is 150.     Patient Instructions: 1)  Schedule a lab visit for fasting labs- lipids, uric acid, cmp AND blood pressure check 2)  Do not eat after midnight before this visit but take blood pressure medications with water. 3)  If blood pressure still elevated will need to increase norvasc. 4)  You need to eat more fiber foods such as  oatmeal, raisins, apples, fiber one ceral. 5)  You will be referred for colonscopy - screening. [facility] will call you with more information about this referral. 6)  Remember you are  not currently on any diabetes medications -however if you continue to gain weight and eat sugary snacks/drinks then you may eventually need medications Prescriptions: ULTRAM 50 MG TABS (TRAMADOL HCL) One tablet by mouth two times a day as needed for pain  #50 x 0   Entered and Authorized by:   [name] FNP   Signed by:   [name] FNP on 06/20/2009   Method used:   Faxed to ...       [facility] (retail)       [address] [place], [place]  [address]       Ph: [phone]       Fax: [phone]   RxID:   [id number]   Laboratory Results   Blood Tests   Date/Time Received: June 20, 2009 9:35 AM   HGBA1C: 6.6%   (Normal Range: Non-Diabetic - 3-6%   Control Diabetic - 6-8%) CBG Random:: 165mg /dL

## 2010-04-11 NOTE — Progress Notes (Signed)
Summary: Medication problem   Phone Note Call from Patient Call back at [facility] Phone [phone]   Summary of Call: The pt stopped using the medication for cholesterol because he start feeling bad.  The medication was feeling weak, and with nausea. [employer].  [facility] FNP Initial call taken by: Alex [name],  Jul 29, 2008 10:20 AM  Follow-up for Phone Call        Alex [name] CALLING BACK TO SEE IF HE CAN COME IN TO SEE THE DR. THE MEDICATION IS CAUSING HIM TO HAVE INDIGESTION AND SOME DIZZINESS. HE SAYS THAT HE STOPPED TAKING THE CHOL. MEDICATION YESTERDAY. Follow-up by: Alex [name],  Jul 29, 2008 12:00 PM  Additional Follow-up for Phone Call Additional follow up Details #1::        Forward to N. Daphine Deutscher, FNP Additional Follow-up by: Alex [name],  Jul 29, 2008 2:59 PM    Additional Follow-up for Phone Call Additional follow up Details #2::    He must be talking about the gemfibrozil which should be taken 30 minutes BEFORE meals. Pt should also be taking lovaza (but he is awaiting this medication from the pharmacy) pt should be eating a low fat diet, no fried foods. all baked foods and plenty of veges. HIs triglycerides were 1260 at last check which is dangerously high.  This is making his gallbladder work too hard which is likely the cause of nausea notify pt that he can come in at next available to discuss if he would like Follow-up by: Alex Prom FNP,  Jul 29, 2008 5:36 PM  Additional Follow-up for Phone Call Additional follow up Details #3:: Details for Additional Follow-up Action Taken: spoke with pt he says he is feeling better and he had ran out of his indigesion medicine but he will be able to pick up Rx on Monday.  He was informed of above information and will restart taking medication for his triglycerides. Informed him to call office if he starts having any problems.  Offered appt but he will wait to see how he handles medication.  Alex [name]   Jul 30, 2008 2:27 PM pt called back and is asking if there is anyway that he can be taken out of work because right now any little thing sets him off and he doesn't like his job, he said that he is very greatful to have a job but the way he feels right now the slightest thing sets him off.  He said he is just not feeling right or like himself.  pt also says that he has been experiencing diarrhea for the past 2 days, he says he is eating and drinking ok, but just not feeling like himself. Additional Follow-up by: Alex [name],  Jul 30, 2008 12:50 PM

## 2010-04-11 NOTE — Letter (Signed)
Summary: Discharge Summary  Discharge Summary   Imported By: Arta Bruce 05/14/2008 09:45:34  _____________________________________________________________________  External Attachment:    Type:   Image     Comment:   External Document

## 2010-04-11 NOTE — Progress Notes (Signed)
Summary: Application for Handicapped Placard   Phone Note Call from Patient Call back at [facility] Phone [phone]   Caller: Patient Summary of Call: The pt dropped off an application for handicapped placard, and he is wondering if the provider can filled out this form for him. Daphine Deutscher FNP Initial call taken by: Manon Hilding,  August 27, 2008 9:59 AM  Follow-up for Phone Call        APPLICATION IN YOUR OFFICE Follow-up by: Leodis Rains,  August 27, 2008 10:07 AM  Additional Follow-up for Phone Call Additional follow up Details #1::        pt does NOT Meet the qualifications which are: not able to walk 200 feet needs brace,cane, crutch, wheelchair restricted by a lung disease uses portable oxygen has a cardiac condition (other than htn) is SEVERELY limited in ability to walk is totally blind Pt does not meet the criteria so will not complete form Additional Follow-up by: [name] FNP,  August 27, 2008 1:36 PM    Additional Follow-up for Phone Call Additional follow up Details #2::    Pt informed.  He asked if I would discard that form for him it will be placed in the shred box Follow-up by: Levon Hedger,  August 27, 2008 4:15 PM

## 2010-04-11 NOTE — Assessment & Plan Note (Signed)
Summary: F/u for labs   Vital Signs:  Patient profile:   55 year old male Height:      67 inches Weight:      270.4 pounds BMI:     42.50 O2 Sat:      97 % on Room air Temp:     97.6 degrees F oral Pulse rate:   60 / minute Pulse rhythm:   regular Resp:     18 per minute BP sitting:   152 / 92  (left arm)  Vitals Entered By: Arthor Captain 01/03/2009  O2 Flow:  Room air CC: F/U labs...pt complains of "feeling yucky" and gestured toward his abdomen, Hypertension Management Is Patient Diabetic? No Pain Assessment Patient in pain? yes     Location: head Intensity: 5 Type: aching Onset of pain  Intermittent   CC:  F/U labs...pt complains of "feeling yucky" and gestured toward his abdomen and Hypertension Management.  History of Present Illness:  Pt into the office for follow up on labs and symptoms as seen per last week. Still with fatigue and weakness since his last visit here in office. Labs, EKG done on last visit after episodes of sweating, fatique and dizziness. No sense further activity.  Hypertension History:      He denies headache, chest pain, and palpitations.        Positive major cardiovascular risk factors include male age 63 years old or older, diabetes, hyperlipidemia, and hypertension.  Negative major cardiovascular risk factors include negative family history for ischemic heart disease and non-tobacco-user status.        Further assessment for target organ damage reveals no history of ASHD, cardiac end-organ damage (CHF/LVH), stroke/TIA, peripheral vascular disease, renal insufficiency, or hypertensive retinopathy.    Habits & Providers  Alcohol-Tobacco-Diet     Alcohol drinks/day: 0     Tobacco Status: never  Exercise-Depression-Behavior     Does Patient Exercise: no     Have you felt down or hopeless? yes     Have you felt little pleasure in things? yes     Depression Counseling: further diagnostic testing and/or other treatment is indicated  Drug Use: no     Seat Belt Use: 100     Sun Exposure: occasionally  Comments: Pt has restarted on zoloft.  Allergies (verified): 1)  ! Codeine 2)  ! * Shellfish  Social History: Smoking Status:  never  Review of Systems General:  Complains of fatigue and weakness. CV:  Denies chest pain or discomfort. Resp:  Denies cough. GI:  Denies abdominal pain, nausea, and vomiting.  Physical Exam  General:  alert.   Head:  normocephalic.   Lungs:  normal breath sounds.   Heart:  normal rate and regular rhythm.   Abdomen:  normal bowel sounds.   Msk:  up to the exam table Neurologic:  alert & oriented X3.     Impression & Recommendations:  Problem # 1:  HYPERTENSION, BENIGN ESSENTIAL (ICD-401.1) BP still elevated advised pt to take all meds as ordered will refer to cardiology for stress test His updated medication list for this problem includes:    Atenolol 100 Mg Tabs (Atenolol) .Marland Kitchen... Take one tablet by mouth daily for blood pressure    Lisinopril 20 Mg Tabs (Lisinopril) .Marland Kitchen... 2 tablets by mouth daily for blood pressure    Hydralazine Hcl 25 Mg Tabs (Hydralazine hcl) ..... One tablet by mouth two times a day    Norvasc 5 Mg Tabs (Amlodipine besylate) ..... One tablet  by mouth daily for blood pressure  Problem # 2:  MOOD SWINGS (ICD-296.99) pt did restart his zoloft as ordered he will titrate up to full dose this week  Complete Medication List: 1)  Atenolol 100 Mg Tabs (Atenolol) .... Take one tablet by mouth daily for blood pressure 2)  Lisinopril 20 Mg Tabs (Lisinopril) .... 2 tablets by mouth daily for blood pressure 3)  Adult Aspirin Ec Low Strength 81 Mg Tbec (Aspirin) 4)  Hydralazine Hcl 25 Mg Tabs (Hydralazine hcl) .... One tablet by mouth two times a day 5)  Naproxen 500 Mg Tabs (Naproxen) .Marland Kitchen.. 1 tablet by mouth two times a day as needed for joint pain 6)  Protonix 40 Mg Tbec (Pantoprazole sodium) .Marland Kitchen.. 1 each day 30 minutes before meal 7)  Ultram 50 Mg Tabs (Tramadol  hcl) .Marland Kitchen.. 1 tablet by mouth at night as needed for shoulder pain 8)  Lovaza 1 Gm Caps (Omega-3-acid ethyl esters) .... 2 capsules by mouth two times a day 9)  Gemfibrozil 600 Mg Tabs (Gemfibrozil) .Marland Kitchen.. 1 tablet by mouth 30 minutes before breakfast and 30 minutes before dinner 10)  Allopurinol 300 Mg Tabs (Allopurinol) .... One tablet by mouth daily for gout **pharmacy - note dose change** 11)  Norvasc 5 Mg Tabs (Amlodipine besylate) .... One tablet by mouth daily for blood pressure 12)  Zoloft 50 Mg Tabs (Sertraline hcl) .... 1/2 tablet by mouth nightly for 1 week then increase to 1 tablet by mouth nightly  Hypertension Assessment/Plan:      The patient's hypertensive risk group is category C: Target organ damage and/or diabetes.  His calculated 10 year risk of coronary heart disease is 27 %.  Today's blood pressure is 152/92.  His blood pressure goal is < 130/80.   Patient Instructions: 1)  Keep your appointment with [facility] for additional testing on your heart. 2)  Take ALL your medications as ordered 3)  You may return to work on Wednesday October 27th.   4)  Follow up 6 weeks for followup.  Come fasting for labs (lipids) 5)  Do not eat after midnight

## 2010-04-11 NOTE — Miscellaneous (Signed)
Summary: Need referral  Phone Note Outgoing Call   Summary of Call: Notify pt that given his upper arm weakness and his continued abnormal labs I am going to refer him to neurology for a possible muscle biopsy.  This biopsy (if they deem necessary) will determine if he is having a problem with his muscles Referral in the basket Initial call taken by: [name] FNP,  April 15, 2008 8:43 AM  Follow-up for Phone Call        informed pt of the referral and informed him that we will call him when we recieve appointment time and date Follow-up by: [place] Shannon,  April 15, 2008 12:42 PM  New Problems: CPK, ABNORMAL (ICD-790.5)   New Problems: CPK, ABNORMAL (ICD-790.5)  Clinical Lists Changes  Problems: Added new problem of CPK, ABNORMAL (ICD-790.5) Orders: Added new Referral order of Neurology Referral (Neuro) - Signed

## 2010-04-11 NOTE — Letter (Signed)
Summary: Handout Printed  Printed Handout:  - Arthritis - Gout, Brief

## 2010-04-11 NOTE — Progress Notes (Signed)
Summary: FOOT PAIN   Phone Note Call from Patient Call back at [facility] Phone [phone]   Summary of Call: The pt is having pain in the right foot (joint area) and the pt though that his previous medication was helping him but he doesn't have any more left.  He doesn't remember the name of the medication.  [facility]) Daphine Deutscher FNP Initial call taken by: Manon Hilding,  August 11, 2008 9:19 AM  Follow-up for Phone Call        MR. Lorah IS CALLING BECAUSE HIS FOOT STILL HAS A LOT OF PAIN IN IT, AND HES NOT SURE ITS GOUT, AND THAT IT MAY BE  SOMETHING ELSE. AND HE SAYS THAT HIS FOOT DOESN'T FEEL RIGHT. Follow-up by: Leodis Rains,  August 12, 2008 9:48 AM  Additional Follow-up for Phone Call Additional follow up Details #1::        pt had both naprosyn and ultram. I'm not sure which he benefitted from in the past. Does the have the bottle to indicate for which he needs refills? Otherwise if he wants his foot assessed he need an appt.  unable to do over phone Additional Follow-up by: [name] FNP,  August 13, 2008 8:50 AM    Additional Follow-up for Phone Call Additional follow up Details #2::    spoke with pt and he said that he went to the hospital on Friday night,  they gave him medicine for his foot and he said it feels a little better.  I told pt that I would get to his provider.  MR Kazmierczak CALLED BACK TO SAYS THE PILLS THEY GAVE HIM FROMER WAS 15 PILLS, AND HE WANTS TO KNOW  IF HE CAN TRY THE OTHER MEDICINE FOR GOUT CALLED ALLOPURINOL. HE SAYS THAT HE DOESN'T WANT TO  BE WITHOUT ANY MEDICATION. HE SAYS THE ONE FROM ER CAUSES HIM TO HAVE DIARRHEA AND HE HAS A FRIEND THATS ON THE ALLOPURINOL. HE USES THE [facility].Leodis Rains  August 19, 2008 2:33 PM  Follow-up by: Levon Hedger,  August 17, 2008 4:27 PM  Additional Follow-up for Phone Call Additional follow up Details #3:: Details for Additional Follow-up Action Taken: last labs done here in  office shows that his kidney labs were slightly elevated. he would need to get those reschecked before starting allopurinol. if still elevated he can't take it - perhaps that's why he was not given that in the hospital (that is also why he should not take other peoples meds) he will need uric acid and cmp  - lab visit  Levon Hedger  August 24, 2008 4:34 PM Left message on machine for pt to return call to the office.  Levon Hedger  August 25, 2008 12:56 PM Left message on machine for pt to return call to the office.  Levon Hedger  August 26, 2008 10:50 AM spoke with pt he will come in 08/27/2008 forlab visit Additional Follow-up by: [name] FNP,  August 20, 2008 5:44 PM

## 2010-04-11 NOTE — Progress Notes (Signed)
Summary: Chest Pain//gk   Phone Note Call from Patient   Summary of Call: Since [place], is feeling very sick body and chest pain and shorness of breath.  Pt have to use his inhaler. [facility] FNP  Initial call taken by: Manon Hilding,  October 19, 2009 10:31 AM  Follow-up for Phone Call        spoke with pt he says since Sunday he has been feeling weak, having some SOB and has some chest discomfort that started Friday it is not constant but comes and goes.  He says he has had to use his inhaler alot because he is having a hard time breathing.  I told pt that I would get this to his provider. Follow-up by: Levon Hedger,  October 19, 2009 10:36 AM  Additional Follow-up for Phone Call Additional follow up Details #1::        offer pt acute slot this afternoon Additional Follow-up by: [name] FNP,  October 19, 2009 11:21 AM    Additional Follow-up for Phone Call Additional follow up Details #2::    PT SCHEDULED AT 4:00 TODAY. Follow-up by: Levon Hedger,  October 19, 2009 11:37 AM

## 2010-04-11 NOTE — Assessment & Plan Note (Signed)
Summary: Visual disturbance   Vital Signs:  Patient profile:   55 year old male Weight:      276.6 pounds BMI:     43.48 BSA:     2.32 Temp:     97.7 degrees F oral Pulse rate:   63 / minute Pulse rhythm:   regular Resp:     16 per minute BP sitting:   137 / 89  (left arm) Cuff size:   large  Vitals Entered By: Levon Hedger (Jul 27, 2009 10:22 AM) CC: pt states his has not been feeling well and experience visual changes on Sunday with a headache and some chest pain...no energy feeling fatigued Is Patient Diabetic? Yes Pain Assessment Patient in pain? yes      CBG Result 221 CBG Device ID [id number]  Does patient need assistance? Functional Status Self care Ambulation Normal   CC:  pt states his has not been feeling well and experience visual changes on Sunday with a headache and some chest pain...no energy feeling fatigued.  History of Present Illness:  Pt into the office with complaints of visual disturbances and headache. 3 days ago - Vision was bright but no blurring Pt has just left his house when episode started.  He was able to make it back to the house.  He drank some water and relaxed.  Episode lasted for about 1 hour.  Similar episode two years ago.  he was admitted to the hospital in [place] for observation but does not recall the dx  Optho - wears glasses.   Needs glasses to drive.  Reading is ok.  He does not need his glasses to readh Last eye exam was over 2 years ago  +heache +snoring +obesity Admits that he does not sleep well ' My nights are sleeping" When he wakes in the morning he does not feel rested  Diabetes Management History:      The patient is a 55 years old male who comes in for evaluation of Type 2 Diabetes Mellitus.  He has not been enrolled in the "Diabetic Education Program".  He is not checking home blood sugars.  He says that he is not exercising regularly.    Allergies (verified): 1)  ! Codeine 2)  ! * Shellfish  Review of  Systems Eyes:  Complains of light sensitivity; denies blurring, double vision, eye irritation, eye pain, itching, and red eye. CV:  Complains of fatigue. Resp:  Denies cough. GI:  Denies abdominal pain, nausea, and vomiting. Neuro:  Complains of headaches; ongoing - predates vision disturbance.  Physical Exam  General:  alert.   Head:  normocephalic.   Eyes:  wears glasses pupils equal, pupils round, and pupils reactive to light.   Lungs:  no accessory muscle use.   Heart:  normal rate and regular rhythm.   Abdomen:  obese Msk:  up to the exam table Neurologic:  alert & oriented X3.     Impression & Recommendations:  Problem # 1:  HEADACHE (ICD-784.0) will refer pt for a sleep study The following medications were removed from the medication list:    Meloxicam 15 Mg Tabs (Meloxicam) ..... One tablet by mouth daily for feet His updated medication list for this problem includes:    Atenolol 100 Mg Tabs (Atenolol) .Marland Kitchen... Take one tablet by mouth daily for blood pressure    Adult Aspirin Ec Low Strength 81 Mg Tbec (Aspirin)    Naproxen 500 Mg Tabs (Naproxen) ..... Hold    Ultram 50  Mg Tabs (Tramadol hcl) ..... One tablet by mouth two times a day as needed for pain  Orders: Split Night (Split Night)  Problem # 2:  OBESITY (ICD-278.00) advised pt that he needs to increase his activity Orders: Split Night (Split Night)  Problem # 3:  HYPERTENSION, BENIGN ESSENTIAL (ICD-401.1) stable today His updated medication list for this problem includes:    Atenolol 100 Mg Tabs (Atenolol) .Marland Kitchen... Take one tablet by mouth daily for blood pressure    Lisinopril 40 Mg Tabs (Lisinopril) ..... One tablet by mouth daily    Hydralazine Hcl 25 Mg Tabs (Hydralazine hcl) ..... One tablet by mouth two times a day    Norvasc 5 Mg Tabs (Amlodipine besylate) ..... One tablet by mouth daily for blood pressure  Orders: Ophthalmology Referral (Ophthalmology)  Problem # 4:  UNSPECIFIED VISUAL DISTURBANCE  (ICD-368.9) pt needs an eye exam  will refer to Dr. Mitzi Davenport pt is diabetic with htn and no eye exam in over 2 year Orders: Ophthalmology Referral (Ophthalmology)  Complete Medication List: 1)  Atenolol 100 Mg Tabs (Atenolol) .... Take one tablet by mouth daily for blood pressure 2)  Lisinopril 40 Mg Tabs (Lisinopril) .... One tablet by mouth daily 3)  Adult Aspirin Ec Low Strength 81 Mg Tbec (Aspirin) 4)  Hydralazine Hcl 25 Mg Tabs (Hydralazine hcl) .... One tablet by mouth two times a day 5)  Naproxen 500 Mg Tabs (Naproxen) .... Hold 6)  Protonix 40 Mg Tbec (Pantoprazole sodium) .Marland Kitchen.. 1 each day 30 minutes before meal 7)  Ultram 50 Mg Tabs (Tramadol hcl) .... One tablet by mouth two times a day as needed for pain 8)  Lovaza 1 Gm Caps (Omega-3-acid ethyl esters) .... 2 capsules by mouth two times a day 9)  Gemfibrozil 600 Mg Tabs (Gemfibrozil) .Marland Kitchen.. 1 tablet by mouth 30 minutes before breakfast and 30 minutes before dinner 10)  Allopurinol 300 Mg Tabs (Allopurinol) .... One tablet by mouth daily for gout **pharmacy - note dose change** 11)  Norvasc 5 Mg Tabs (Amlodipine besylate) .... One tablet by mouth daily for blood pressure 12)  Zoloft 50 Mg Tabs (Sertraline hcl) .... 1/2 tablet by mouth nightly for 1 week then increase to 1 tablet by mouth nightly 13)  Colchicine 0.6 Mg Tabs (Colchicine) .... One tablet by mouth three times a day as needed for gout flare  Other Orders: Capillary Blood Glucose/CBG (16010) Capillary Blood Glucose/CBG (93235)  Diabetes Management Assessment/Plan:      The following lipid goals have been established for the patient: Total cholesterol goal of 200; LDL cholesterol goal of 100; HDL cholesterol goal of 40; Triglyceride goal of 150.  His blood pressure goal is < 130/80.    Patient Instructions: 1)  You will need to be scheduled for a sleep study. 2)  This will determine if you are sleep apenic 3)  You will need to increase activity in your routine. 4)   Start get up early and go for a walk - start with about 10 minutes and gradually increase to goal of 20-30 about four times per week. 5)  Blood pressure is doing better.  Continue to take your medications as ordered. 6)  This office will schedule you with Dr. Mitzi Davenport for an eye exam. You will be notified of the time/date of this appt

## 2010-04-11 NOTE — Progress Notes (Signed)
Summary: Requesting note for work   Programme researcher, broadcasting/film/video of Call: This is a continuation of the last phone note.   Unfortunately, I'm not able to write him a note to be out of work simply because he does not like his job. Perhaps he is having increased stressors which is making his job more difficult to handle. If this is the case then perhaps he needs to seek some counseling or some other coping means. Initial call taken by: [name] FNP,  Aug 02, 2008 8:45 AM  Follow-up for Phone Call        left message on machine for pt to return call to the office. Levon Hedger  Aug 02, 2008 12:53 PM  Levon Hedger  Aug 03, 2008 4:45 PM Spokewith pt he is informed of above information.  Follow-up by: Levon Hedger,  Aug 03, 2008 4:45 PM

## 2010-04-11 NOTE — Assessment & Plan Note (Signed)
Summary: HTN/Gout   Vital Signs:  Patient profile:   55 year old male Weight:      267.3 pounds BMI:     42.02 BSA:     2.29 Temp:     97.3 degrees F oral Pulse rate:   62 / minute Pulse rhythm:   regular Resp:     16 per minute BP sitting:   141 / 93  (left arm) Cuff size:   large  Vitals Entered By: Levon Hedger (February 09, 2009 10:55 AM) CC: follow-up visit, Hypertension Management, Lipid Management Is Patient Diabetic? Yes Pain Assessment Patient in pain? no      CBG Result 102 CBG Device ID B  Does patient need assistance? Functional Status Self care Ambulation Normal   CC:  follow-up visit, Hypertension Management, and Lipid Management.  History of Present Illness:  Pt into the office for follow up. Stress test done but pt had poor tolerance due to possible claudication.  Gout Flare x 1 week ago.  denies eating any foods that may have caused flare. Takes allopurinol daily Picked up colchicine from [facility] as ordered but is taking very sparingly  Hypertension History:      He complains of chest pain, but denies headache and palpitations.  He notes no problems with any antihypertensive medication side effects.        Positive major cardiovascular risk factors include male age 21 years old or older, diabetes, hyperlipidemia, and hypertension.  Negative major cardiovascular risk factors include negative family history for ischemic heart disease and non-tobacco-user status.        Positive history for target organ damage include peripheral vascular disease.  Further assessment for target organ damage reveals no history of ASHD, cardiac end-organ damage (CHF/LVH), stroke/TIA, renal insufficiency, or hypertensive retinopathy.    Lipid Management History:      Positive NCEP/ATP III risk factors include male age 28 years old or older, diabetes, HDL cholesterol less than 40, hypertension, and peripheral vascular disease.  Negative NCEP/ATP III risk factors include no  family history for ischemic heart disease, non-tobacco-user status, no ASHD (atherosclerotic heart disease), no prior stroke/TIA, and no history of aortic aneurysm.        The patient states that he does not know about the "Therapeutic Lifestyle Change" diet.  He expresses no side effects from his lipid-lowering medication.  The patient denies any symptoms to suggest myopathy or liver disease.     Medications Prior to Update: 1)  Atenolol 100 Mg Tabs (Atenolol) .... Take One Tablet By Mouth Daily For Blood Pressure 2)  Lisinopril 20 Mg Tabs (Lisinopril) .... 2 Tablets By Mouth Daily For Blood Pressure 3)  Adult Aspirin Ec Low Strength 81 Mg Tbec (Aspirin) 4)  Hydralazine Hcl 25 Mg Tabs (Hydralazine Hcl) .... One Tablet By Mouth Two Times A Day 5)  Naproxen 500 Mg Tabs (Naproxen) .Marland Kitchen.. 1 Tablet By Mouth Two Times A Day As Needed For Joint Pain 6)  Protonix 40 Mg  Tbec (Pantoprazole Sodium) .Marland Kitchen.. 1 Each Day 30 Minutes Before Meal 7)  Ultram 50 Mg Tabs (Tramadol Hcl) .Marland Kitchen.. 1 Tablet By Mouth At Night As Needed For Shoulder Pain 8)  Lovaza 1 Gm Caps (Omega-3-Acid Ethyl Esters) .... 2 Capsules By Mouth Two Times A Day 9)  Gemfibrozil 600 Mg Tabs (Gemfibrozil) .Marland Kitchen.. 1 Tablet By Mouth 30 Minutes Before Breakfast and 30 Minutes Before Dinner 10)  Allopurinol 300 Mg Tabs (Allopurinol) .... One Tablet By Mouth Daily For Gout **pharmacy -  Note Dose Change** 11)  Norvasc 5 Mg Tabs (Amlodipine Besylate) .... One Tablet By Mouth Daily For Blood Pressure 12)  Zoloft 50 Mg Tabs (Sertraline Hcl) .... 1/2 Tablet By Mouth Nightly For 1 Week Then Increase To 1 Tablet By Mouth Nightly 13)  Colchicine 0.6 Mg Tabs (Colchicine) .... One Tablet By Mouth Three Times A Day As Needed For Gout Flare  Allergies (verified): 1)  ! Codeine 2)  ! * Shellfish  Review of Systems CV:  Denies leg cramps with exertion. MS:  Complains of joint pain; left great toe.  Physical Exam  General:  alert.   Head:  normocephalic.   Lungs:   normal breath sounds.   Heart:  normal rate and regular rhythm.   Abdomen:  normal bowel sounds.   Msk:  left great toe - inflammed, limited ROM Neurologic:  alert & oriented X3.   Psych:  Oriented X3.     Impression & Recommendations:  Problem # 1:  GOUT (ICD-274.9) advised pt to take colchicine three times a day x 3 days then as needed  if still unresolved over the weekend can add meloxicam but he can't take this with any other anti-inflammatory reviewed foods to limit The following medications were removed from the medication list:    Mobic 15 Mg Tabs (Meloxicam) ..... One tablet by mouth daily for feet His updated medication list for this problem includes:    Naproxen 500 Mg Tabs (Naproxen) ..... Hold    Allopurinol 300 Mg Tabs (Allopurinol) ..... One tablet by mouth daily for gout **pharmacy - note dose change**    Colchicine 0.6 Mg Tabs (Colchicine) ..... One tablet by mouth three times a day as needed for gout flare    Meloxicam 15 Mg Tabs (Meloxicam) ..... One tablet by mouth daily for feet  Problem # 2:  HYPERTENSION, BENIGN ESSENTIAL (ICD-401.1) elevated today.  If still elevated on next visit willn increase norvasc to 10mg   His updated medication list for this problem includes:    Atenolol 100 Mg Tabs (Atenolol) .Marland Kitchen... Take one tablet by mouth daily for blood pressure    Lisinopril 20 Mg Tabs (Lisinopril) .Marland Kitchen... 2 tablets by mouth daily for blood pressure    Hydralazine Hcl 25 Mg Tabs (Hydralazine hcl) ..... One tablet by mouth two times a day    Norvasc 5 Mg Tabs (Amlodipine besylate) ..... One tablet by mouth daily for blood pressure  Problem # 3:  INTERMITTENT CLAUDICATION (ICD-443.9) advised to pt restart ASA, take cholesterol meds will need doppler and possible vascular referral  Complete Medication List: 1)  Atenolol 100 Mg Tabs (Atenolol) .... Take one tablet by mouth daily for blood pressure 2)  Lisinopril 20 Mg Tabs (Lisinopril) .... 2 tablets by mouth daily for  blood pressure 3)  Adult Aspirin Ec Low Strength 81 Mg Tbec (Aspirin) 4)  Hydralazine Hcl 25 Mg Tabs (Hydralazine hcl) .... One tablet by mouth two times a day 5)  Naproxen 500 Mg Tabs (Naproxen) .... Hold 6)  Protonix 40 Mg Tbec (Pantoprazole sodium) .Marland Kitchen.. 1 each day 30 minutes before meal 7)  Ultram 50 Mg Tabs (Tramadol hcl) .Marland Kitchen.. 1 tablet by mouth at night as needed for shoulder pain 8)  Lovaza 1 Gm Caps (Omega-3-acid ethyl esters) .... 2 capsules by mouth two times a day 9)  Gemfibrozil 600 Mg Tabs (Gemfibrozil) .Marland Kitchen.. 1 tablet by mouth 30 minutes before breakfast and 30 minutes before dinner 10)  Allopurinol 300 Mg Tabs (Allopurinol) .... One tablet by  mouth daily for gout **pharmacy - note dose change** 11)  Norvasc 5 Mg Tabs (Amlodipine besylate) .... One tablet by mouth daily for blood pressure 12)  Zoloft 50 Mg Tabs (Sertraline hcl) .... 1/2 tablet by mouth nightly for 1 week then increase to 1 tablet by mouth nightly 13)  Colchicine 0.6 Mg Tabs (Colchicine) .... One tablet by mouth three times a day as needed for gout flare 14)  Meloxicam 15 Mg Tabs (Meloxicam) .... One tablet by mouth daily for feet  Other Orders: Capillary Blood Glucose/CBG (25956)  Hypertension Assessment/Plan:      The patient's hypertensive risk group is category C: Target organ damage and/or diabetes.  His calculated 10 year risk of coronary heart disease is 27 %.  Today's blood pressure is 141/93.  His blood pressure goal is < 130/80.  Lipid Assessment/Plan:      Based on NCEP/ATP III, the patient's risk factor category is "history of coronary disease, peripheral vascular disease, cerebrovascular disease, or aortic aneurysm along with either diabetes, current smoker, or LDL > 130 plus HDL < 40 plus triglycerides > 200".  The patient's lipid goals are as follows: Total cholesterol goal is 200; LDL cholesterol goal is 100; HDL cholesterol goal is 40; Triglyceride goal is 150.     Patient Instructions: 1)  You have  gout in your feet.  Continue the allopurinol 300mg  by mouth daily.   You should take the colchicine 0.6mg  by mouth three times a day for 3 days. 2)  Claudication - Possible cause for leg pain.   3)  If it continues you will need to get dopplers of your legs. 4)  Follow up in 3 months for diabetes or sooner if necessary. 5)  will need hgba1c, lipids, uric acid Prescriptions: MELOXICAM 15 MG TABS (MELOXICAM) One tablet by mouth daily for feet  #30 x 0   Entered and Authorized by:   [name] FNP   Signed by:   [name] FNP on 02/09/2009   Method used:   Print then Give to Patient   RxID:   [id number] MOBIC 15 MG TABS (MELOXICAM) One tablet by mouth daily for feet  #30 x 3   Entered and Authorized by:   [name] FNP   Signed by:   [name] FNP on 02/09/2009   Method used:   Print then Give to Patient   RxID:   [id number]

## 2010-04-11 NOTE — Miscellaneous (Signed)
Summary: Waiver of Liability/[employer] [facility] Liability/[facility]   Imported By: Lester Prado Verde 05/22/2008 10:52:27  _____________________________________________________________________  External Attachment:    Type:   Image     Comment:   External Document

## 2010-04-13 NOTE — Progress Notes (Signed)
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 PT WANTS TO APPLY FOR DISABLITY....   PT WILL PICK UP AT OFFICE WHEN READY.... Initial call taken by: [place] Shannon,  March 28, 2010 11:02 AM  Follow-up for Phone Call        I'm unsure what he needs but with write a letter with his dx and meds. Pt was not given any restrictions from this office letter printed and in basket Follow-up by: [name] FNP,  March 28, 2010 1:34 PM  Additional Follow-up for Phone Call Additional follow up Details #1::        pt informed of above information.  Will come in tomorrow to pick up letter.  Letter in front office file. Additional Follow-up by: Levon Hedger,  March 28, 2010 4:10 PM

## 2010-04-13 NOTE — Letter (Signed)
Summary: *Referral Letter  [facility]  [address] [place], [place] [address]   Phone: [phone]  Fax: [phone]    03/28/2010  Alex [name] [address] [place], [place]  [address]  Phone: [phone]  To whom it may concern  This letter is regarding Alex [name] who is an established patient in this office.  See below for a list of his past medical history and medications.  Current Medical Problems: 1)  HEADACHE (ICD-784.0) 2)  OBESITY (ICD-278.00) 3)  UNSPECIFIED VISUAL DISTURBANCE (ICD-368.9) 4)  BLOOD IN STOOL (ICD-578.1) 5)  INTERMITTENT CLAUDICATION (ICD-443.9) 6)  * AORTIC ROOT DILATATION 7)  NEED PROPHYLACTIC VACCINATION&INOCULATION FLU (ICD-V04.81) 8)  INSOMNIA UNSPECIFIED (ICD-780.52) 9)  DEGENERATIVE DISC DISEASE, LUMBAR SPINE (ICD-722.52) 10)  BACK PAIN (ICD-724.5) 11)  ABSCESS, TOOTH (ICD-522.5) 12)  MOOD SWINGS (ICD-296.99) 13)  ESOPHAGEAL STRICTURE (ICD-530.3) 14)  CPK, ABNORMAL (ICD-790.5) 15)  SHOULDER PAIN, BILATERAL (ICD-719.41) 16)  HYPERTENSION, BENIGN ESSENTIAL (ICD-401.1) 17)  HYPERCHOLESTEROLEMIA (ICD-272.0) 18)  VASECTOMY, HX OF (ICD-V26.52) 19)  RHABDOMYOLYSIS (ICD-728.88) 20)  CHEST PAIN (ICD-786.50) 21)  GOUT (ICD-274.9) 22)  DIABETES MELLITUS (ICD-250.00)   Current Medications: 1)  ATENOLOL 100 MG TABS (ATENOLOL) Take one tablet by mouth daily for blood pressure 2)  LISINOPRIL 40 MG TABS (LISINOPRIL) One tablet by mouth daily 3)  ADULT ASPIRIN EC LOW STRENGTH 81 MG TBEC (ASPIRIN)  4)  HYDRALAZINE HCL 25 MG TABS (HYDRALAZINE HCL) One tablet by mouth two times a day 5)  NAPROXEN 500 MG TABS (NAPROXEN) One tablet by mouth two times a day as needed for pain 6)  PROTONIX 40 MG  TBEC (PANTOPRAZOLE SODIUM) 1 each day 30 minutes before meal 7)  ULTRAM 50 MG TABS (TRAMADOL HCL) One tablet by mouth two times a day as needed for pain 8)  LOVAZA 1 GM CAPS (OMEGA-3-ACID ETHYL ESTERS) 2 capsules by  mouth two times a day 9)  GEMFIBROZIL 600 MG TABS (GEMFIBROZIL) 1 tablet by mouth 30 minutes before breakfast and 30 minutes before dinner 10)  ALLOPURINOL 300 MG TABS (ALLOPURINOL) One tablet by mouth daily for gout **Pharmacy - note dose change** 11)  NORVASC 5 MG TABS (AMLODIPINE BESYLATE) One tablet by mouth daily for blood pressure 12)  ZOLOFT 50 MG TABS (SERTRALINE HCL) 1/2 tablet by mouth nightly for 1 week then increase to 1 tablet by mouth nightly 13)  COLCHICINE 0.6 MG TABS (COLCHICINE) One tablet by mouth three times a day as needed for gout flare  Please contact us if you have any further questions or need additional information.  Sincerely,    [name] FNP [facility]

## 2010-04-14 NOTE — Procedures (Signed)
Summary: ENDO Prep/[facility]  ENDO Prep/[facility]   Imported By: Lester Girard 05/22/2008 10:54:49  _____________________________________________________________________  External Attachment:    Type:   Image     Comment:   External Document

## 2010-04-14 NOTE — Progress Notes (Signed)
Summary: Office Visit/depression screening  Office Visit/depression screening   Imported By: Arta Bruce 01/03/2009 16:10:96  _____________________________________________________________________  External Attachment:    Type:   Image     Comment:   External Document

## 2010-05-05 ENCOUNTER — Encounter (INDEPENDENT_AMBULATORY_CARE_PROVIDER_SITE_OTHER): Payer: Self-pay | Admitting: Nurse Practitioner

## 2010-05-05 ENCOUNTER — Encounter: Payer: Self-pay | Admitting: Nurse Practitioner

## 2010-05-05 LAB — CONVERTED CEMR LAB
Blood Glucose, Fingerstick: 185
Blood in Urine, dipstick: NEGATIVE
Cholesterol, target level: 200 mg/dL
Glucose, Urine, Semiquant: NEGATIVE
HDL goal, serum: 40 mg/dL
Ketones, urine, test strip: NEGATIVE
LDL Goal: 70 mg/dL
Nitrite: NEGATIVE
Protein, U semiquant: 30
Specific Gravity, Urine: >=1.03
Urobilinogen, UA: 0.2
WBC Urine, dipstick: NEGATIVE
pH: 5

## 2010-05-08 ENCOUNTER — Encounter (INDEPENDENT_AMBULATORY_CARE_PROVIDER_SITE_OTHER): Payer: Self-pay | Admitting: Nurse Practitioner

## 2010-05-09 NOTE — Assessment & Plan Note (Signed)
Summary: HTN   Vital Signs:  Patient profile:   55 year old male Weight:      302.3 pounds O2 Sat:      93 % on Room air Temp:     97.0 degrees F oral Pulse rate:   76 / minute Pulse rhythm:   regular Resp:     20 per minute BP sitting:   150 / 110  (left arm) Cuff size:   large  Vitals Entered By: Levon Hedger (May 05, 2010 12:06 PM)  O2 Flow:  Room air  Serial Vital Signs/Assessments:                                PEF    PreRx  PostRx Time      O2 Sat  O2 Type     L/min  L/min  L/min   By 1:46 PM   93  %   Room air                          Hale Drone CMA  CC: chest pain that comes and goes...pain in feet with toe joints, Lipid Management, Hypertension Management, Abdominal Pain Is Patient Diabetic? No Pain Assessment Patient in pain? yes     Location: chest CBG Result 185 CBG Device ID A non fasting  Does patient need assistance? Functional Status Self care Ambulation Normal   CC:  chest pain that comes and goes...pain in feet with toe joints, Lipid Management, Hypertension Management, and Abdominal Pain.  History of Present Illness:  Pt into the office today for an acute visit  Obesity - 287 on last visit and 302 on todays visit. Still no meaningful exercise or physical exam. Social  - pt is not employed at this time.  Previously employed as a Naval architect.  Dyspepsia History:      He has no alarm features of dyspepsia including no history of melena, hematochezia, dysphagia, persistent vomiting, or involuntary weight loss > 5%.  The patient does not have a prior history of documented ulcer disease.  The dominant symptom is not heartburn or acid reflux.  An H-2 blocker medication is not currently being taken.  He has no history of a positive H. Pylori serology.  A prior EGD has been done which showed no evidence for moderate or severe esophagitis or Barrett's esophagus.    Hypertension History:      He complains of orthopnea, but denies headache, chest  pain, and palpitations.  He notes no problems with any antihypertensive medication side effects.        Positive major cardiovascular risk factors include male age 25 years old or older, diabetes, hyperlipidemia, and hypertension.  Negative major cardiovascular risk factors include negative family history for ischemic heart disease and non-tobacco-user status.        Positive history for target organ damage include peripheral vascular disease.  Further assessment for target organ damage reveals no history of ASHD, cardiac end-organ damage (CHF/LVH), stroke/TIA, renal insufficiency, or hypertensive retinopathy.    Lipid Management History:      Positive NCEP/ATP III risk factors include male age 32 years old or older, diabetes, HDL cholesterol less than 40, hypertension, and peripheral vascular disease.  Negative NCEP/ATP III risk factors include no family history for ischemic heart disease, non-tobacco-user status, no ASHD (atherosclerotic heart disease), no prior stroke/TIA, and no history of aortic aneurysm.  The patient states that he does not know about the "Therapeutic Lifestyle Change" diet.  Comments include: pt STOPPEd taking lovaza and Gemfibrozil because "I didn't like the way it makes it feel".       Allergies: 1)  ! Codeine 2)  ! * Shellfish  Review of Systems ENT:  Denies earache. CV:  Complains of swelling of feet. Resp:  Complains of shortness of breath. Derm:  Hx of left ingrown toe nail and he has previously went to the podiatrist who recommended removal of the toenail and pt declined. Pt did a self procedure by which he disected a part of his toenail. Neuro:  Complains of numbness; bil achiness under bilateral feet when standing for extended periods of time.  Physical Exam  General:  alert.  obese Head:  normocephalic.   Lungs:  normal breath sounds.   Heart:  normal rate and regular rhythm.   Neurologic:  alert & oriented X3.   Skin:  color normal.   Psych:   Oriented X3.     Impression & Recommendations:  Problem # 1:  HYPERTENSION, BENIGN ESSENTIAL (ICD-401.1) BP elevated today ongoing issue for pt - perhaps will need to change meds His updated medication list for this problem includes:    Atenolol 100 Mg Tabs (Atenolol) .Marland Kitchen... Take one tablet by mouth daily for blood pressure    Lisinopril 40 Mg Tabs (Lisinopril) ..... One tablet by mouth daily    Hydralazine Hcl 25 Mg Tabs (Hydralazine hcl) ..... One tablet by mouth two times a day    Norvasc 5 Mg Tabs (Amlodipine besylate) ..... One tablet by mouth daily for blood pressure    Furosemide 20 Mg Tabs (Furosemide) ..... One tablet by mouth daily  Orders: Furosemide- Lasix Injection (J1940) Admin of Therapeutic Inj  intramuscular or subcutaneous ([address])  Problem # 2:  HYPERCHOLESTEROLEMIA (ICD-272.0) pt is NOT taking his meds. advised that this is very dangerous especially since triglycerides were over 1000 in 2010 pt is not fasting today.  Will check labs on next week His updated medication list for this problem includes:    Lovaza 1 Gm Caps (Omega-3-acid ethyl esters) .Marland Kitchen... 2 capsules by mouth two times a day    Gemfibrozil 600 Mg Tabs (Gemfibrozil) .Marland Kitchen... 1 tablet by mouth 30 minutes before breakfast and 30 minutes before dinner  Problem # 3:  OBESITY (ICD-278.00) dramatic weight gain.  Problem # 4:  DIABETES MELLITUS (ICD-250.00)  No in house Hgba1c - will check with labs on Monday His updated medication list for this problem includes:    Lisinopril 40 Mg Tabs (Lisinopril) ..... One tablet by mouth daily    Adult Aspirin Ec Low Strength 81 Mg Tbec (Aspirin)  Orders: Capillary Blood Glucose/CBG (60454)  Problem # 5:  DEGENERATIVE DISC DISEASE, LUMBAR SPINE (ICD-722.52) ongoing issue for pt  Complete Medication List: 1)  Atenolol 100 Mg Tabs (Atenolol) .... Take one tablet by mouth daily for blood pressure 2)  Lisinopril 40 Mg Tabs (Lisinopril) .... One tablet by mouth  daily 3)  Adult Aspirin Ec Low Strength 81 Mg Tbec (Aspirin) 4)  Hydralazine Hcl 25 Mg Tabs (Hydralazine hcl) .... One tablet by mouth two times a day 5)  Naproxen 500 Mg Tabs (Naproxen) .... One tablet by mouth two times a day as needed for pain 6)  Protonix 40 Mg Tbec (Pantoprazole sodium) .Marland Kitchen.. 1 each day 30 minutes before meal 7)  Ultram 50 Mg Tabs (Tramadol hcl) .... One tablet by mouth two times  a day as needed for pain 8)  Lovaza 1 Gm Caps (Omega-3-acid ethyl esters) .... 2 capsules by mouth two times a day 9)  Gemfibrozil 600 Mg Tabs (Gemfibrozil) .Marland Kitchen.. 1 tablet by mouth 30 minutes before breakfast and 30 minutes before dinner 10)  Allopurinol 300 Mg Tabs (Allopurinol) .... One tablet by mouth daily for gout **pharmacy - note dose change** 11)  Norvasc 5 Mg Tabs (Amlodipine besylate) .... One tablet by mouth daily for blood pressure 12)  Zoloft 50 Mg Tabs (Sertraline hcl) .... 1/2 tablet by mouth nightly for 1 week then increase to 1 tablet by mouth nightly 13)  Colchicine 0.6 Mg Tabs (Colchicine) .... One tablet by mouth three times a day as needed for gout flare 14)  Furosemide 20 Mg Tabs (Furosemide) .... One tablet by mouth daily  Other Orders: Pulse Oximetry (single measurment) (14782)  Hypertension Assessment/Plan:      The patient's hypertensive risk group is category C: Target organ damage and/or diabetes.  His calculated 10 year risk of coronary heart disease is 40 %.  Today's blood pressure is 150/110.  His blood pressure goal is < 130/80.  Lipid Assessment/Plan:      Based on NCEP/ATP III, the patient's risk factor category is "history of coronary disease, peripheral vascular disease, cerebrovascular disease, or aortic aneurysm along with either diabetes, current smoker, or LDL > 130 plus HDL < 40 plus triglycerides > 200".  The patient's lipid goals are as follows: Total cholesterol goal is 200; LDL cholesterol goal is 70; HDL cholesterol goal is 40; Triglyceride goal is 150.     Patient Instructions: 1)  You need to schedule a lab visit on Monday for fasting labs and WEIGHT. 2)  No food after midnight before this visit 3)  Lipids, cmp, cbc, psa, uric acid, BNP, Hgba1c 4)  Take furosemide 20mg  by mouth on Saturday, Sunday and Monday then as needed.  This will help some with the fluid. Prescriptions: FUROSEMIDE 20 MG TABS (FUROSEMIDE) One tablet by mouth daily  #30 x 0   Entered and Authorized by:   [name] FNP   Signed by:   [name] FNP on 05/05/2010   Method used:   Print then Give to Patient   RxID:   [id number]    Medication Administration  Injection # 1:    Medication: Furosemide- Lasix Injection    Diagnosis: HYPERTENSION, BENIGN ESSENTIAL (ICD-401.1)    Route: IM    Site: RUOQ gluteus    Exp Date: 01/11/2011    Lot #: [id number]    Mfr: Hospira    Comments: NDC: 2952-8413-24    Patient tolerated injection without complications    Given by: Hale Drone CMA (May 05, 2010 1:48 PM)  Orders Added: 1)  Pulse Oximetry (single measurment) [94760] 2)  Furosemide- Lasix Injection [J1940] 3)  Admin of Therapeutic Inj  intramuscular or subcutaneous [96372] 4)  Capillary Blood Glucose/CBG [82948] 5)  Est. Patient Level IV [40102]    Laboratory Results   Urine Tests  Date/Time Received: May 05, 2010 1:29 PM   Routine Urinalysis   Color: lt. yellow Glucose: negative   (Normal Range: Negative) Bilirubin: small   (Normal Range: Negative) Ketone: negative   (Normal Range: Negative) Spec. Gravity: >=1.030   (Normal Range: 1.003-1.035) Blood: negative   (Normal Range: Negative) pH: 5.0   (Normal Range: 5.0-8.0) Protein: 30   (Normal Range: Negative) Urobilinogen: 0.2   (Normal Range: 0-1) Nitrite: negative   (Normal Range: Negative) Leukocyte  Esterace: negative   (Normal Range: Negative)     Blood Tests     CBG Random:: 185mg /dL

## 2010-05-10 ENCOUNTER — Telehealth (INDEPENDENT_AMBULATORY_CARE_PROVIDER_SITE_OTHER): Payer: Self-pay | Admitting: Nurse Practitioner

## 2010-05-11 LAB — CONVERTED CEMR LAB
ALT: 66 units/L — ABNORMAL HIGH (ref 0–53)
AST: 41 units/L — ABNORMAL HIGH (ref 0–37)
Albumin: 4.5 g/dL (ref 3.5–5.2)
Alkaline Phosphatase: 63 units/L (ref 39–117)
BUN: 22 mg/dL (ref 6–23)
Basophils Absolute: 0.1 10*3/uL (ref 0.0–0.1)
Basophils Relative: 1 % (ref 0–1)
CO2: 24 meq/L (ref 19–32)
Calcium: 9.5 mg/dL (ref 8.4–10.5)
Chloride: 102 meq/L (ref 96–112)
Cholesterol: 229 mg/dL — ABNORMAL HIGH (ref 0–200)
Creatinine, Ser: 1.14 mg/dL (ref 0.40–1.50)
Eosinophils Absolute: 0.9 10*3/uL — ABNORMAL HIGH (ref 0.0–0.7)
Eosinophils Relative: 11 % — ABNORMAL HIGH (ref 0–5)
Glucose, Bld: 120 mg/dL — ABNORMAL HIGH (ref 70–99)
HCT: 44.2 % (ref 39.0–52.0)
HDL: 39 mg/dL — ABNORMAL LOW (ref >39)
Hemoglobin: 14.2 g/dL (ref 13.0–17.0)
Hgb A1c MFr Bld: 6.8 % — ABNORMAL HIGH (ref <5.7)
LDL Cholesterol: 113 mg/dL — ABNORMAL HIGH (ref 0–99)
Lymphocytes Relative: 33 % (ref 12–46)
Lymphs Abs: 2.7 10*3/uL (ref 0.7–4.0)
MCHC: 32.1 g/dL (ref 30.0–36.0)
MCV: 84 fL (ref 78.0–100.0)
Monocytes Absolute: 0.6 10*3/uL (ref 0.1–1.0)
Monocytes Relative: 7 % (ref 3–12)
Neutro Abs: 3.9 10*3/uL (ref 1.7–7.7)
Neutrophils Relative %: 48 % (ref 43–77)
PSA: 0.3 ng/mL (ref <=4.00)
Platelets: 281 10*3/uL (ref 150–400)
Potassium: 4.2 meq/L (ref 3.5–5.3)
Pro B Natriuretic peptide (BNP): 4 pg/mL (ref 0.0–100.0)
RBC: 5.26 M/uL (ref 4.22–5.81)
RDW: 15.7 % — ABNORMAL HIGH (ref 11.5–15.5)
Sodium: 139 meq/L (ref 135–145)
Total Bilirubin: 0.7 mg/dL (ref 0.3–1.2)
Total CHOL/HDL Ratio: 5.9
Total Protein: 7.9 g/dL (ref 6.0–8.3)
Triglycerides: 383 mg/dL — ABNORMAL HIGH (ref <150)
Uric Acid, Serum: 10.4 mg/dL — ABNORMAL HIGH (ref 4.0–7.8)
VLDL: 77 mg/dL — ABNORMAL HIGH (ref 0–40)
WBC: 8.1 10*3/uL (ref 4.0–10.5)

## 2010-05-18 ENCOUNTER — Telehealth (INDEPENDENT_AMBULATORY_CARE_PROVIDER_SITE_OTHER): Payer: Self-pay | Admitting: Nurse Practitioner

## 2010-05-18 NOTE — Progress Notes (Signed)
Summary: WANTS LAB RESULTS  Phone Note Call from Patient Call back at Home Phone [phone]   Reason for Call: Lab or Test Results Summary of Call: Alex PT. Alex [name] CALLED TO GET HIS LAB RESULTS. Initial call taken by: Leodis Rains,  May 10, 2010 10:24 AM  Follow-up for Phone Call        Sent to N. Daphine Deutscher.  Dutch Quint RN  May 10, 2010 12:02 PM   Additional Follow-up for Phone Call Additional follow up Details #1::        Thanks to pt for coming to get labs on Monday as ordered. He has a number of things abnormal. 1.  HIs cholesterol is high BUT not as high as before so he should continue to monitor diet in attempts to lower.  But his liver labs are high so that means his liver is working too hard to get rid of the extra fat in his system.  i think he needs to start back on the gemfibrozil by mouth two times a day (new rx sent to pharmacy).  Will HOLD the lovaza for now so he will only be on 1 cholesterol medication. 2.  His uric acid (gout lab) is high.  This could attribute to achiness in joints.  Will restart allopurinol 300mg  by mouth daily (new rx sent to pharmacy) 3.  His Hgba1c = 6.8 which means he is now diabetic.  he needs to start diabetes diet which means avoid sugar and extra carb (schedule appt with Susie Piper if able).  Start on metformin 500mg  by mouth two times a day for blood sugar.  Rx sent to pharmacy along with Rx for glucometer to check blood sugar Additional Follow-up by: [name] FNP,  May 11, 2010 10:10 AM    Additional Follow-up for Phone Call Additional follow up Details #2::    Pt. advised of lab results, new Rxs and provider's response and recommendations.  Appt. with Susie Piper 05/16/10.  Will call back if any questions.  Dutch Quint RN  May 11, 2010 5:10 PM   New/Updated Medications: GEMFIBROZIL 600 MG TABS (GEMFIBROZIL) 1 tablet by mouth 30 minutes before breakfast and 30 minutes before dinner ALLOPURINOL 300 MG TABS  (ALLOPURINOL) One tablet by mouth daily for gout METFORMIN HCL 500 MG TABS (METFORMIN HCL) One tablet by mouth two times a day for blood sugar TRUETRACK BLOOD GLUCOSE  DEVI (BLOOD GLUCOSE MONITORING SUPPL) Use to check blood sugar daily LANCETS  MISC (LANCETS) use to check blood sugar daily BLOOD GLUCOSE TEST  STRP (GLUCOSE BLOOD) Use to check blood sugar daily Prescriptions: BLOOD GLUCOSE TEST  STRP (GLUCOSE BLOOD) Use to check blood sugar daily  #50 x 11   Entered and Authorized by:   [name] FNP   Signed by:   [name] FNP on 05/11/2010   Method used:   Faxed to ...       [facility] - [facility] (retail)       [address] [place], [place]  [address]       Ph: [phone]       Fax: [phone]   RxID:   [id number] LANCETS  MISC (LANCETS) use to check blood sugar daily  #50 x 11   Entered and Authorized by:   [name] FNP   Signed by:   [name] FNP on 05/11/2010   Method used:   Faxed to .Marland KitchenMarland Kitchen  [facility] - [facility] (retail)       [address] [place], [place]  [address]       Ph: [phone]       Fax: [phone]   RxID:   [id number] TRUETRACK BLOOD GLUCOSE  DEVI (BLOOD GLUCOSE MONITORING SUPPL) Use to check blood sugar daily  #1 meter x 0   Entered and Authorized by:   [name] FNP   Signed by:   [name] FNP on 05/11/2010   Method used:   Faxed to ...       [facility] - [facility] (retail)       [address] [place], [place]  [address]       Ph: [phone]       Fax: [phone]   RxID:   [id number] METFORMIN HCL 500 MG TABS (METFORMIN HCL) One tablet by mouth two times a day for blood sugar  #60 x 11   Entered and Authorized by:   [name] FNP   Signed by:   [name] FNP on 05/11/2010   Method used:   Faxed to ...       [facility] - [facility] (retail)        [address] [place], [place]  [address]       Ph: [phone]       Fax: [phone]   RxID:   [id number] ALLOPURINOL 300 MG TABS (ALLOPURINOL) One tablet by mouth daily for gout  #30 x 11   Entered and Authorized by:   [name] FNP   Signed by:   [name] FNP on 05/11/2010   Method used:   Faxed to ...       [facility] - [facility] (retail)       [address] [place], [place]  [address]       Ph: [phone]       Fax: [phone]   RxID:   [id number] GEMFIBROZIL 600 MG TABS (GEMFIBROZIL) 1 tablet by mouth 30 minutes before breakfast and 30 minutes before dinner  #60 x 11   Entered and Authorized by:   [name] FNP   Signed by:   [name] FNP on 05/11/2010   Method used:   Faxed to ...       [facility] - [facility] (retail)       [address] [place], [place]  [address]       Ph: [phone]       Fax: [phone]   RxID:   [id number]   Appended Document: WANTS LAB RESULTS Called pt. back and explained that appt. with Susie Piper cancelled for now.  Either we'll call him back or he can call in a few weeks to schedule.  Dutch Quint RN  May 11, 2010  5:25 PM

## 2010-05-30 NOTE — Progress Notes (Signed)
Summary: disability question  Phone Note Call from Patient Call back at Home Phone [phone]   Reason for Call: Talk to Nurse, Talk to Doctor Summary of Call: Patient wants to ask a question about filing for disabilityl. Initial call taken by: Ayesha Rumpf,  May 18, 2010 2:17 PM  Follow-up for Phone Call        Wants to know if provider agrees with pt. filing for disability, will she sign the papers so he can be approved?  States thought he would find out before he finished trying to file. Follow-up by: Dutch Quint RN,  May 24, 2010 1:43 PM  Additional Follow-up for Phone Call Additional follow up Details #1::        I don't "sign" papers for him to be granted disability.  that's a process that he will need to start with social security.  That's not something that I decide. What i do is send his records once I know he has filed and his records will show what I determine to be his illnesses and deficients Additional Follow-up by: [name] FNP,  May 24, 2010 1:56 PM    Additional Follow-up for Phone Call Additional follow up Details #2::    Advised of provider's response -- verbalized understanding and agreement.  Dutch Quint RN  May 24, 2010 2:11 PM

## 2010-06-22 LAB — COMPREHENSIVE METABOLIC PANEL
ALT: 38 U/L (ref 0–53)
AST: 30 U/L (ref 0–37)
Albumin: 4.2 g/dL (ref 3.5–5.2)
Alkaline Phosphatase: 61 U/L (ref 39–117)
BUN: 23 mg/dL (ref 6–23)
CO2: 24 mEq/L (ref 19–32)
Calcium: 9.9 mg/dL (ref 8.4–10.5)
Chloride: 105 mEq/L (ref 96–112)
Creatinine, Ser: 1.51 mg/dL — ABNORMAL HIGH (ref 0.4–1.5)
GFR calc Af Amer: 59 mL/min — ABNORMAL LOW (ref >60)
GFR calc non Af Amer: 49 mL/min — ABNORMAL LOW (ref >60)
Glucose, Bld: 100 mg/dL — ABNORMAL HIGH (ref 70–99)
Potassium: 3.5 mEq/L (ref 3.5–5.1)
Sodium: 139 mEq/L (ref 135–145)
Total Bilirubin: 1.4 mg/dL — ABNORMAL HIGH (ref 0.3–1.2)
Total Protein: 7.8 g/dL (ref 6.0–8.3)

## 2010-06-22 LAB — DIFFERENTIAL
Basophils Absolute: 0.1 10*3/uL (ref 0.0–0.1)
Basophils Relative: 2 % — ABNORMAL HIGH (ref 0–1)
Eosinophils Absolute: 0.5 10*3/uL (ref 0.0–0.7)
Eosinophils Relative: 5 % (ref 0–5)
Lymphocytes Relative: 38 % (ref 12–46)
Lymphs Abs: 3.5 10*3/uL (ref 0.7–4.0)
Monocytes Absolute: 0.5 10*3/uL (ref 0.1–1.0)
Monocytes Relative: 6 % (ref 3–12)
Neutro Abs: 4.6 10*3/uL (ref 1.7–7.7)
Neutrophils Relative %: 50 % (ref 43–77)

## 2010-06-22 LAB — CBC
HCT: 47.3 % (ref 39.0–52.0)
Hemoglobin: 15.6 g/dL (ref 13.0–17.0)
MCHC: 33 g/dL (ref 30.0–36.0)
MCV: 82.7 fL (ref 78.0–100.0)
Platelets: 302 10*3/uL (ref 150–400)
RBC: 5.72 MIL/uL (ref 4.22–5.81)
RDW: 14.1 % (ref 11.5–15.5)
WBC: 9.2 10*3/uL (ref 4.0–10.5)

## 2010-07-25 NOTE — Assessment & Plan Note (Signed)
[facility]                            CARDIOLOGY OFFICE NOTE   ROLLA, KEDZIERSKI                       MRN:          [id number]  DATE:03/08/2008                            DOB:          1955/05/01    [name] is a pleasant 55 year old gentleman who I am asked to  evaluate for chest pain and a question of an RA/RV mass.  The patient  has undergone cardiac catheterization in the past, this was June 14, 2000.  At that time, he was found to have a 25% stenosis in the proximal  PDA.  His ejection fraction was 55%.  He has been treated medically.  He  was recently admitted to [facility] with complaints of chest  pain.  He did state that the pain is in the substernal area.  It lasts  seconds at a time and can be anywhere from a sharp to dull pain.  There  was also an occasional pain in the left breast area and shoulder area.  Note, the pain is not pleuritic or positional nor is related to food.  It is not exertional.  He did rule out myocardial infarction with serial  enzymes.  While he was in the hospital, he also had CT angiogram of his  chest.  This showed no evidence of pulmonary embolism.  There was patchy  atelectasis bilaterally.  There was a subcentimeter hypodense in the  liver that was statistically benign.  The report does state consider 3  to 60-month followup.  The patient also had an echocardiogram performed  on February 17, 2008 by Dr. Lucas Mallow.  This showed normal LV function.  There was mild LVH.  In the apical four-chamber view, there was a  suggestion of a possible mass across tricuspid valve.  This is felt  potentially be artifact, but a mass could not be excluded.  I did review  this and agreed with the interpretation.  The patient was discharged on  February 18, 2008.  Since discharge, he denies any further dyspnea, chest  pain, palpitations, or syncope.  Because of his chest pain and abnormal  echocardiogram, we were asked to  further evaluate.   MEDICATIONS:  1. Lisinopril 40 mg p.o. daily.  2. HCTZ 25 mg p.o. daily.  3. Atenolol 100 mg p.o. daily.  4. Hydralazine 10 mg p.o. q.i.d.  5. Naproxen 500 mg p.o. b.i.d.  6. Aspirin 81 mg p.o. daily.   ALLERGIES:  He had an allergy to CODEINE.   SOCIAL HISTORY:  He has a remote history of tobacco use, but does not  smoke in 4 years.  He rarely consumes alcohol.   FAMILY HISTORY:  Significant for congestive heart failure in his mother.  His father had hypertension.   PAST MEDICAL HISTORY:  Significant for hypertension as well as  hyperlipidemia.  He also states he was diagnosed recently with diabetes.  He has had previous sinus surgery as well as vasectomy.  There is no  other significant past medical history noted.   REVIEW OF SYSTEMS:  He denies any headaches, fevers, or  chills.  There  is no productive cough or hemoptysis.  He does have chronic dysphasia.  He apparently had an evaluation years ago, but I did not have those  records available.  It sounds to have been a barium swallow and he  states there was a kink in his upper esophagus.  He states that he  usually consumes liquids with meals that he has able to have his food go  down.  There is no melena or hematochezia.  There is no dysuria,  hematuria, or seizure activity.  There is no orthopnea, PND, or pedal  edema.  Remaining systems are negative.   PHYSICAL EXAMINATION:  VITAL SIGNS:  Today shows a blood pressure of  130/98.  His pulse is 58.  He weighs 262 pounds.  GENERAL:  He is well developed and somewhat obese.  He is in no acute  distress at present.  He does not appeared to be depressed.  SKIN:  Warm and dry.  There is no peripheral hub.  BACK:  Normal.  HEENT:  Normal.  Normal eyelids.  NECK:  Supple with normal upstroke bilaterally.  No bruits noted.  There  is no jugular venous distention.  I cannot appreciate thyromegaly.  CHEST:  Clear to auscultation.  No expansion.   CARDIOVASCULAR:  Regular rhythm.  Normal S1 and S2.  There are no  murmurs, rubs, or gallops noted.  ABDOMEN:  Nontender.  Nondistended.  Positive bowel sounds.  No  hepatosplenomegaly.  No mass appreciated.  There is no abdominal bruit.  He has 2+ femoral pulses.  Bilaterally no bruits.  EXTREMITIES:  No edema.  Palpate no cords.  He has 2+ dorsalis pedis  pulses bilaterally.  NEUROLOGIC:  Grossly intact.   His electrocardiogram shows a sinus rhythm at a rate of 58.  There is a  first-degree AV block.  There are no significant ST changes.   DIAGNOSES:  1. Atypical chest pain - Mr. Hark symptoms are atypical.  We will      plan to proceed with a Myoview for risk stratification.  If shows      no ischemia, then we will not pursue further cardiac evaluation      from ischemia standpoint.  2. Question of right atrial/right ventricle mass - we will plan to      proceed with a cardiac MRI to exclude this.  I did review the      echocardiogram and agreed with the interpretation.  I did consider      transesophageal echocardiogram.  The patient does have dysphagia      due to a question of a kink in his esophagus.  I therefore,      think, cardiac MRI would to be more appropriate.  3. Hypertension - his blood pressure elevated today.  I have asked him      to increase his hydralazine to 25 mg p.o. q.i.d.  4. History of hyperlipidemia - managed per his primary care physician.   We will see him back in approximately 6 weeks to review the above  information.     Madolyn Frieze Jens Som, MD, [facility]  Electronically Signed    BSC/MedQ  DD: 03/08/2008  DT: 03/09/2008  Job #: [id number]   cc:   Dala Dock

## 2010-07-25 NOTE — H&P (Signed)
NAMEJRAKE, RODRIQUEZ NO.:  [id number]   MEDICAL RECORD NO.:  [id number]          PATIENT TYPE:  INP   LOCATION:  2006                         FACILITY:  [facility]   PHYSICIAN:  Acey Lav, MD  DATE OF BIRTH:  [birth year]   DATE OF ADMISSION:  02/15/2008  DATE OF DISCHARGE:                              HISTORY & PHYSICAL   PRIMARY CARE PHYSICIAN:  His primary care physician is at the [facility].   CHIEF COMPLAINT:  Chest pain and chest pressure.   HISTORY OF PRESENT ILLNESS:  Alex [name] is a 55 year old African American  gentleman with a past medical history significant for hypertension, who  also is a Naval architect.  He presents with a history of 3 weeks of right-  sided shoulder pain, worse with elevation of his shoulder with no  obvious aggravating factor.  He wonders if moving, turning some large  devices on the truck may have precipitated this.  He then developed some  left shoulder pain and then within the last week began to have chest  pressure substernally that lasted approximately half an hour.  He rates  to about 2/10 in severity.  The chest pressure came on at rest day and  then went away in 30 minutes.  He also began to have fleeting sharp  chest pain substernally that come and go, lasting a few minutes at a  time or even a few seconds and have come and gone since then, rates at  7/10 in severity utmost.  He presented to [facility] Emergency  Department today and was evaluated.  Upon arrival, he was chest pain  free.  He was given several aspirin tablets as well as Norvasc.  His EKG  apparently showed no significant ST or T-wave changes.  He was in normal  sinus rhythm.  Initial cardiac enzymes were negative.  The patient was  sating well on room air and was in no acute distress.  I was called by  the [facility] Emergency Department who arranged for transfer to our  hospital, and the patient now has arrived at [facility].   The patient denies that  chest pressure or chest pain has been  accompanied by any nausea, diaphoresis, vomiting, dyspnea.  He has,  however, felt more dyspneic of late.  He suffers from some asthma.  He  also has allergies to shellfish, which causes him to have angioedema and  also precipitates his asthma.  He has felt weaker over the last several  weeks and has felt a little more dyspneic, although he does not  associate this with the chest pain.   PAST MEDICAL HISTORY:  1. Hypertension.  2. Asthma.   SURGICAL HISTORY:  No significant surgical history.   ALLERGIES:  He has allergy to CODEINE, which causes nausea.  He is  allergic to [facility] and SHRIMP, which causes angioedema.   SOCIAL HISTORY:  The patient is a Naval architect, lives in [place].  He  is married.  He has not been tested for HIV, but he is willing to be  tested.  Has  no history of STDs.  He is a nonsmoker, although he is  exposed to secondhand smoke.  Denies alcohol or recreational drug use.   FAMILY HISTORY:  He has no history of early coronary artery disease.   REVIEW OF SYSTEMS:  As noted in the history of present illness,  otherwise, 10-point review of systems is negative.   PHYSICAL EXAMINATION:  VITAL SIGNS:  Upon arrival at [facility], his  blood pressure was 175/118, temperature 97, pulse 62, respirations 18,  he is sating 99% room air, weight was 118 kg.  GENERAL:  Quite pleasant gentleman in no acute distress.  HEENT:  Normocephalic, atraumatic.  Pupils are equal, round, and  reactive to light.  Sclerae anicteric.  Oropharynx clear without  exudate.  NECK:  Without jugular venous distention.  CARDIOVASCULAR:  Regular rate and rhythm without murmurs, gallops, or  rubs.  LUNGS:  Clear to auscultation bilaterally without wheezes, rhonchi, or  rales.  ABDOMEN:  Soft, nondistended, nontender.  LOWER EXTREMITIES:  Without edema.  MUSCULOSKELETAL:  Some pain with abduction of the shoulder bilaterally.  His chest had some  slight tenderness to palpation of the chest wall.   LABORATORY DATA:  EKG from [facility] not available but apparently did  not show ST or T-wave changes.  I am going to obtain a new EKG here.  His CBC with differential showed white count of 7.8, hemoglobin 14.1,  platelets 281 with ANC of 3.8.  Cardiac markers were negative.  Metabolic panel showed a normal sodium and potassium, BUN and creatinine  of 17 and 1.3.  His toxicology panel showed it to be negative for  amphetamines, barbiturates, benzos, cocaine, opiates, or marijuana.   ASSESSMENT AND PLAN:  1. Chest pain:  The elements of this  are rather atypical in nature.      We will, nonetheless, admit him to telemetry and monitor him      cycling cardiac enzymes.  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      He is not hypoxic, although he had had some pain in his lower      extremities recently.  He also is not tachycardic, though he is      blocked with Cardizem. Therefore,  I am going to get a CT angiogram      to rule out pulmonary embolism.  We will cycle his cardiac enzymes.      We will check lipid panel in the morning.  We will continue him on      his medications, which include Exforge and diltiazem.  I am also      put him on aspirin.  I will give him nitroglycerin and morphine as      needed for pain.  2. Hypertension.  The patient will be continued on diltiazem and      Exforge.  I am going to give clonidine 0.2 mg now and then every 2      hours as needed for high systolic blood pressures over 161W or      diastolics above 100.  3. Prophylaxis.  The patient will be on heparin subcu 5000 t.i.d.  4. Asthma.  He will be on albuterol metered-dose inhaler as needed.  5. HIV screening: Would check HIV antibody   CODE STATUS:  The patient is a full code.      Acey Lav, MD  Electronically Signed     CV/MEDQ  D:  02/15/2008  T:   02/16/2008  Job:  [id number]

## 2010-07-25 NOTE — Discharge Summary (Signed)
NAMEARMAND, PREAST                ACCOUNT NO.:  [id number]   MEDICAL RECORD NO.:  [id number]          PATIENT TYPE:  INP   LOCATION:  2006                         FACILITY:  [facility]   PHYSICIAN:  Beckey Rutter, MD  DATE OF BIRTH:  [birth year]   DATE OF ADMISSION:  02/15/2008  DATE OF DISCHARGE:  02/18/2008                               DISCHARGE SUMMARY   PRIMARY CARE PHYSICIAN:  The patient used to see Dr. Loleta Chance.   HISTORY OF PRESENT ILLNESS AND CHIEF COMPLAINT:  A 55 year old very  pleasant African American male admitted with chest pain.   HOSPITAL COURSE:  1. During hospital course, the patient was ruled out for acute      coronary syndromes by serial cardiac enzymes and EKGs.  The patient      also has a 2-D echo which was not suggestive of CHF or wall motion      abnormalities.  Please see below.  2. Slight elevation in CPK, which is felt secondary to element of      rhabdomyolysis.  The patient is currently stable with CPK/total CK      around 300.  3. Blood pressure, which seems to be the problem for the patient.  He      is noncompliant with his medication.  He is frequently unable to      follow up with his primary physician secondary to insurance problem      lately as well as financial burdens.  The patient now has agreed to      go to [facility] to further monitor and continue him on      medication.  The patient will be discharged on Zestril and      hydrochlorothiazide as well as atenolol.  Please see medication      list.   HOSPITAL PROCEDURES:  The patient had a CT angiogram on February 15, 2008, to rule out pulmonary embolism which was negative.  The patient  has shoulder x-ray for the left shoulder on February 16, 2008.  The  impression was mild cystic degenerative change of the humeral head,  otherwise normal right shoulder.  The patient has type 3 acromion which  could predispose to impingement.  Otherwise, normal left shoulder.  His  lab tests showing CPK  306, homocystine is 11.9, and TSH is 1.99.  CK  yesterday was 298.  Hepatic function test was essentially within normal.  Lipid profile on February 16, 2008, showing total cholesterol of 207,  triglyceride is 301, cholesterol HDL is 27, LDL is 120, and VLDL is 60  with total cholesterol/HDL ratio 7.7.   A 2-D echo on February 17, 2008 read by Dr. Lucas Mallow, the summary showing  overall left ventricular systolic function was normal.  Left ventricular  ejection fraction was estimated range being 55-65%.  There was no  diagnostic evidence of left ventricular regional wall motion  abnormalities.  Left ventricular wall thickness was mildly increased.  Left ventricular diastolic function parameters were normal.   In the apical four-chamber view, there is suggestion of possible mass  sitting across the  tricuspid valve.  There is little movement.  The  venous maybe due to artifact, but thrombus or mass cannot be ruled out.  Further study might be appropriate.   DISCHARGE MEDICATIONS:  1. Aspirin 81 mg p.o. daily.  2. Hydrochlorothiazide 25 mg p.o. daily.  3. Zestril 20 mg p.o. daily.  4. Atenolol 50 mg p.o. b.i.d., that is 100 mg per day.  5. Zocor 40 mg daily.  6. Hydralazine 10 mg p.o. q.i.d.   DISCHARGE DIAGNOSES:  1. Chest pain, noncardiogenic.  The patient ruled out for acute      coronary syndrome.  2. Elevated total CPK, felt secondary to shoulder pain/mild      rhabdomyolysis.  3. Hypertension, requiring more than 1 agent/uncontrolled.   DISCHARGE PLAN:  The patient was agreeable to HIV screening but is not  done, he wanted to do it as outpatient.  The patient would have  [facility] appointment prior to his discharge to further follow  up with his antihypertensive medication and to assess the need for  further CAD and cardiac risk stratification.  The patient had abnormal  echo in the four-chamber view, felt secondary to artifact and the  patient has no symptoms currently.   Nevertheless, he is recommended to  follow up in 2-3 months or if the patient becomes symptomatic.  He is  aware and agreeable to discharge plan.      Beckey Rutter, MD  Electronically Signed     EME/MEDQ  D:  02/18/2008  T:  02/19/2008  Job:  [id number]

## 2010-07-25 NOTE — Assessment & Plan Note (Signed)
[facility]                            CARDIOLOGY OFFICE NOTE   Alex, [name]                       MRN:          [id number]  DATE:04/16/2008                            DOB:          [birth year]    [name] is a pleasant 55 year old gentleman that I recently saw on  March 08, 2008 for atypical chest pain.  There is also a question of  a right atrial/right ventricular mass on a previous echocardiogram.  When we saw him previously, we did schedule him to have a Myoview.  This  was performed on March 10, 2008.  His ejection fraction was 48%.  There is mild apical thinning, but no ischemia or scar.  There was left  ventricular enlargement.  We scheduled him to have a followup  echocardiogram, both to requantify his LV function and to rule out the  right atrial mass.  This was performed on March 31, 2008.  He was  found to have distal septal hypokinesis, but his ejection fraction is  55%.  There was mild-to-moderate LVH.  There was no mass noted across  the tricuspid valve.  The left atrium is mildly dilated.  The ascending  aorta measured 43 mm.  Since that time, he had done well from a  symptomatic standpoint.  He does continue to have chest pain.  The pain  is in the left chest area and lasts for 15 seconds at a time.  It is not  exertional.  It is not pleuritic or positional.  It is not related to  food.  It does not radiate.  There is no associated symptoms.  He also  has dyspnea on exertion.  There is no orthopnea, PND, pedal edema,  palpitations, or syncope.   MEDICATIONS:  1. Lisinopril 40 mg p.o. daily.  2. HCTZ 25 mg p.o. daily.  3. Atenolol 100 mg p.o. daily.  4. Hydralazine 10 mg p.o. daily.  5. Naproxen.  6. Aspirin 81 mg p.o. daily.   PHYSICAL EXAMINATION:  VITAL SIGNS:  Blood pressure of 136/89.  His  pulse is 80.  He weighs 266 pounds.  HEENT:  Normal.  NECK:  Supple.  CHEST:  Clear.  CARDIOVASCULAR:  Regular  rhythm.  ABDOMEN:  No tenderness.  EXTREMITIES:  No edema.   DIAGNOSES:  1. Atypical chest pain - the patient's Myoview shows no infarct or      ischemia and the symptoms are extremely atypical.  We will not      pursue this further.  2. Question of right atrial mass - this was not observed on his      followup echocardiogram.  It did not show per his MRI, but I think      we do not need to pursue this further, given that his followup      echocardiogram showed no mass.  3. Hypertension - he will continue his present blood pressure      medications.  He will follow up with Dr. Daphine Deutscher at [facility].  4. Mildly dilated aortic root - he will follow up with Dr. Daphine Deutscher  for      this as well.  He may need a repeat CT scan in approximately 2      years to make sure that his aortic root is not growing.  5. History of hyperlipidemia - managed per his primary care physician.   We will see him back on an as-needed basis.     Madolyn Frieze Jens Som, MD, [facility]  Electronically Signed    BSC/MedQ  DD: 04/16/2008  DT: 04/17/2008  Job #: [id number]   cc:   [name], @ [facility]

## 2010-07-28 NOTE — Cardiovascular Report (Signed)
[place]  Patient:    Alex [name], Alex [name]                       MRN: [id number] Proc. Date: 06/14/00 Adm. Date:  [id number] Attending:  Rollene Rotunda CC:         Stacie Acres. Cliffton Asters, M.D.   Cardiac Catheterization  DATE OF BIRTH:  23-Nov-1955  PRIMARY PHYSICIAN:  Stacie Acres. Cliffton Asters, M.D.  PROCEDURE:  Left heart cardiac catheterization/coronary arteriography.  INDICATIONS:  Evaluate patient with Cardiolite suggesting a reduced ejection fraction and possible inferior ischemia.  DESCRIPTION OF PROCEDURE:  Left heart catheterization was performed via the right femoral artery.  The artery was cannulated using an anterior wall puncture.  A #6 French arterial sheath was inserted via the modified Seldinger technique.  Preformed Judkins and a pigtail catheter were utilized.  The patient tolerated the procedure well and left the lab in stable condition.  RESULTS:  HEMODYNAMICS:  LV 120/20, AO 118/77.  CORONARY ARTERIOGRAPHY:  Left main:  The left main was normal.  Left anterior descending:  The LAD was normal.  Circumflex:  The circumflex was normal.  Right coronary artery:  The right coronary artery had luminal irregularities with a 25% stenosis proximal to the PDA.  LEFT VENTRICULOGRAM:  The left ventriculogram was obtained in the RAO projection.  The EF was 55% with normal wall motion.  CONCLUSION:  Minimal coronary artery plaquing.  Well preserved left ventricular function.  PLAN:  The patient needs no further cardiovascular testing.  He will continue to have management of his hypertension per Dr. Cliffton Asters. DD:  06/14/00 TD:  06/14/00 Job: [id number]

## 2010-07-28 NOTE — H&P (Signed)
NAME:  Alex [name], Alex [name]                          ACCOUNT NO.:  [id number]   MEDICAL RECORD NO.:  [id number]                   PATIENT TYPE:  INP   LOCATION:  0153                                 [facility]ACILITY:  [facility]   PHYSICIAN:  Jackie Plum, M.D.             DATE OF BIRTH:  06/05/1955   DATE OF ADMISSION:  10/14/2003  DATE OF DISCHARGE:                                HISTORY & PHYSICAL   CHIEF COMPLAINT:  Chest pain.   HISTORY OF PRESENT ILLNESS:  The patient is a 55 year old gentleman with a  history of hypertension, formerly being followed by Dr. Stacie Acres. White of  [employer], however, the patient is not following with her  anymore.  He presents with a 4-day history of left submammary area chest  pain which he described as more of an achy pain of moderate intensity,  radiating to his left arm.  He just had some shortness of breath and has  been feeling a bit dizzy.  He had been diaphoretic.  He denies any history  of nausea or vomiting.  There is no known aggravating or alleviating factor  for his pain.  He denies any history of fever, chills, cough, sputum  production, ankle swelling, calf or leg pain.  He denies any headaches,  visual changes, abdominal pain, black or bloody stools, dysuria or frequency  of micturition.   PAST MEDICAL HISTORY:  Past medical history is significant for history of  hypertension.  He used to be on Lotensin and calcium channel blocker, and  these medications were stopped about 6 months ago on account of cost and had  been started on hydrochlorothiazide.  He said he had been taking his  medication as had been instructed.  He denies any history of heart disease.  In fact, he had a cardiac catheterization by Dr. Rollene Rotunda in April  2002 as a followup of a Cardiolite test suggesting a reduced EF and possible  inferior ischemia.  The cath noted minimal coronary artery plaquing with  well-preserved left ventricular function.   MEDICATION HISTORY:   ALLERGIES:  He is allergic to CODEINE.   CURRENT MEDICINE:  Medication history notable for hydrochlorothiazide 25 mg  p.o. daily. (He was taking Lotensin 40 mg daily as well as Cardizem 30 mg  daily, but these medications had been stopped on account of cost.)   FAMILY HISTORY:  Family history is positive for heart disease.   SOCIAL HISTORY:  The patient is a Naval architect.  He drinks alcohol  occasionally and he stopped smoking cigarettes about 3 years ago; he used to  smoke 1 pack of cigarettes per day for about 3 years only, according to  patient.   REVIEW OF SYSTEMS:  Significant positives and negatives as stated in HPI,  but otherwise, review of systems was unremarkable.   PHYSICAL EXAMINATION:  VITAL SIGNS:  BP was 153/127 mmHg on presentation to  the ED at 2035 hours; however, it had come down to 168/120 mmHg at time of  my evaluation at 2259 hours, after IV labetalol was initiation.  Heart rate  of 55 per minute.  Respiratory rate of 20 per minute.  O2 saturation of 98%  on oxygen at 2 L per minute by nasal cannula.  Temperature was 98.5 degrees  Fahrenheit.  GENERAL EXAMINATION:  He was in no cardiopulmonary distress; in fact, he was  pain-free at time of my evaluation.  HEENT:  Normocephalic, atraumatic.  Pupils are equal, round and reactive to  light.  Extraocular movements were intact.  I could not appreciate a fundus.  NECK:  Neck was supple with no JVD, no carotid bruit.  LUNGS:  Lungs were clear to auscultation bilaterally.  CARDIAC:  Exam notable for regular rate and rhythm.  He did not have any S3  or S4 gallop.  ABDOMEN:  Abdomen was obese, soft, nontender.  Bowel sounds were present.  EXTREMITY EXAM:  He had no edema.  CNS:  He was alert and oriented x3, no acute focal deficits.   LABORATORY WORK:  Twelve-lead EKG notable for normal sinus rhythm at 81  beats per minute with left ventricular hypertrophy without any acute ST-T  wave  changes.   Chest x-ray has been ordered, results are still pending.   WBC count of 8.6, hemoglobin 13.9, hematocrit 42.2, MCV 82.9, platelet count  292,000.  Pro time 11.5, INR 0.8, PTT 28.  Sodium 138, potassium 3.3,  chloride 101, CO2 29, glucose 103, BUN 17, creatinine 1.3, total bilirubin  0.7, alkaline phosphatase 56, SGOT 31, SGPT 43, total protein 7.1, albumin  3.6, calcium 9.5.  Point-of-care cardiac markers notable for CK-MB of 2.1  with troponin I less than 0.05, myoglobin 114.  UA was color yellow, clear  in appearance, specific gravity of 1.021, pH of 5.0, glucose negative,  bilirubin negative, ketones negative, small blood, negative protein, 0.2  urobilinogen, negative nitrite, negative leukocytes and rbc's of 0-2 per  high-power field.   IMPRESSION:  1. Accelerated hypertension.  The patient presents with diastolic blood     pressure of more than 120 mmHg.  His chest pain does not seem anginal,     however, radiation to his left arm is quite concerning, especially with     his associated shortness of breath and diaphoresis without any nausea.     The patient does not have any evidence of __________ , change in mental     status or encephalopathy or myocardial infarction.  He does not have any     hemolytic anemia, congestive heart failure, headache of renal failure to     be concerning for hypertensive crisis.  The cause of this is secondary to     drug withdraw, i.e., ACE inhibitor and also uncontrolled hypertension     with his hydrochlorothiazide.  2. Diuretic-induced hypokalemia which is mild.   PLAN OF CARE:  The patient will be admitted to stepdown on IV labetalol drip  with the aim of reduction of the diastolic BP by 25% to 33%.  We will rule  out MI with serial cardiac enzymes and will get a repeat EKG in the morning.  His hypokalemia will be monitored and corrected as needed.  On account of cost, it would be nice if we could optimize his hypertension control  with  increased dose of hydrochlorothiazide and be offered a primary care  physician with the help of Care Management for  followup past discharge.                                               Jackie Plum, M.D.    GO/MEDQ  D:  10/15/2003  T:  10/15/2003  Job:  [id number]

## 2010-08-29 ENCOUNTER — Ambulatory Visit (HOSPITAL_COMMUNITY)
Admission: RE | Admit: 2010-08-29 | Discharge: 2010-08-29 | Disposition: A | Discharge status: Home / Self Care | Payer: Medicaid Other | Source: Ambulatory Visit | Attending: Internal Medicine | Admitting: Internal Medicine

## 2010-08-29 ENCOUNTER — Other Ambulatory Visit: Payer: Self-pay | Admitting: Internal Medicine

## 2010-08-29 DIAGNOSIS — R079 Chest pain, unspecified: Secondary | ICD-10-CM | POA: Insufficient documentation

## 2010-08-29 DIAGNOSIS — R062 Wheezing: Secondary | ICD-10-CM | POA: Insufficient documentation

## 2010-08-29 DIAGNOSIS — R0602 Shortness of breath: Secondary | ICD-10-CM | POA: Insufficient documentation

## 2010-09-14 ENCOUNTER — Ambulatory Visit (INDEPENDENT_AMBULATORY_CARE_PROVIDER_SITE_OTHER): Payer: Medicaid Other

## 2010-09-14 ENCOUNTER — Inpatient Hospital Stay (INDEPENDENT_AMBULATORY_CARE_PROVIDER_SITE_OTHER)
Admission: RE | Admit: 2010-09-14 | Discharge: 2010-09-14 | Disposition: A | Discharge status: Home / Self Care | Payer: Medicaid Other | Source: Ambulatory Visit | Attending: Family Medicine | Admitting: Family Medicine

## 2010-09-14 DIAGNOSIS — S60459A Superficial foreign body of unspecified finger, initial encounter: Secondary | ICD-10-CM

## 2010-10-18 ENCOUNTER — Ambulatory Visit (HOSPITAL_COMMUNITY)
Admission: RE | Admit: 2010-10-18 | Discharge: 2010-10-18 | Disposition: A | Discharge status: Home / Self Care | Payer: Medicaid Other | Source: Ambulatory Visit | Attending: Internal Medicine | Admitting: Internal Medicine

## 2010-10-18 DIAGNOSIS — I10 Essential (primary) hypertension: Secondary | ICD-10-CM | POA: Insufficient documentation

## 2010-10-18 DIAGNOSIS — E119 Type 2 diabetes mellitus without complications: Secondary | ICD-10-CM | POA: Insufficient documentation

## 2010-10-18 DIAGNOSIS — R072 Precordial pain: Secondary | ICD-10-CM | POA: Insufficient documentation

## 2010-10-18 DIAGNOSIS — I517 Cardiomegaly: Secondary | ICD-10-CM

## 2010-10-19 IMAGING — CT CT ANGIO CHEST
3 of 9 series · 18 of 36 positions shown · IV contrast (APPLIED)
Comparison: None

CLINICAL DATA: Chest pain

CT ANGIOGRAPHY CHEST
TECHNIQUE: Multidetector CT imaging of the chest using the
standard protocol during bolus administration of intravenous
contrast. Multiplanar reconstructed images including MIPs were
obtained and reviewed to evaluate the vascular anatomy.
Contrast: 162 ml Omnipaque 350

[Series 7: pulm embolism 3.0 b60f lung · axial · 0.78mm/px · z∈[-234,-102]mm · 3 of 88 slices shown]
[im 22/88  mediastinal]
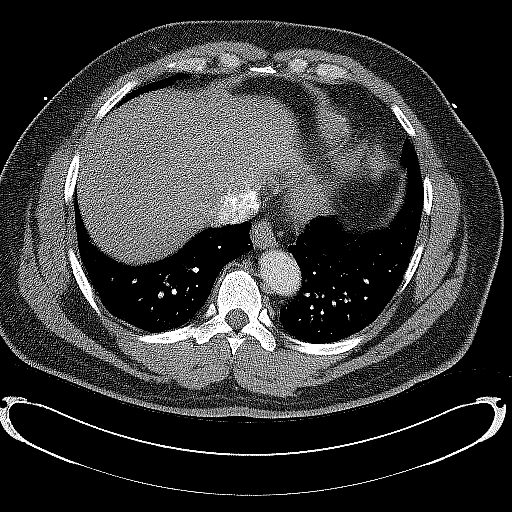
[im 44/88  mediastinal]
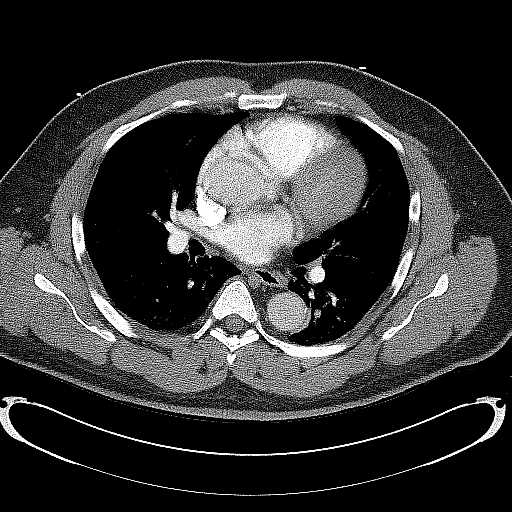
[im 66/88  mediastinal]
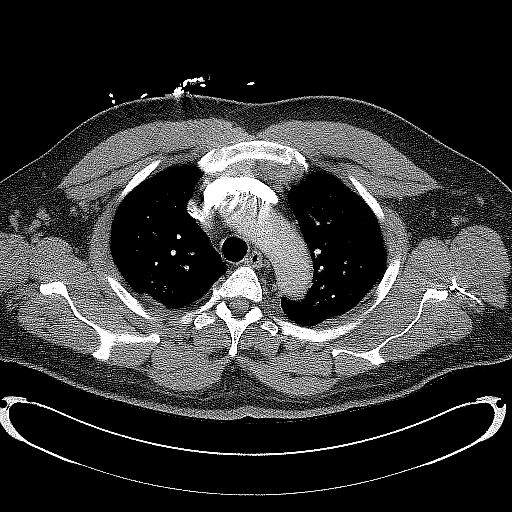

[Series 10: pulm embolism 1.0 b25f thins · axial · 0.78mm/px · z∈[-294,-55]mm · 14 of 277 slices shown]
[im 19/277  lung]
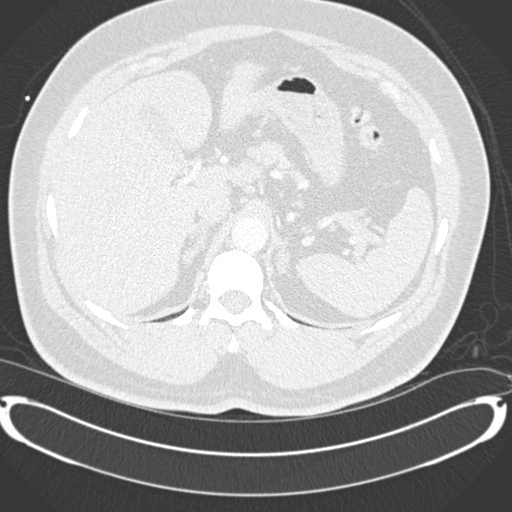
[im 37/277  mediastinal]
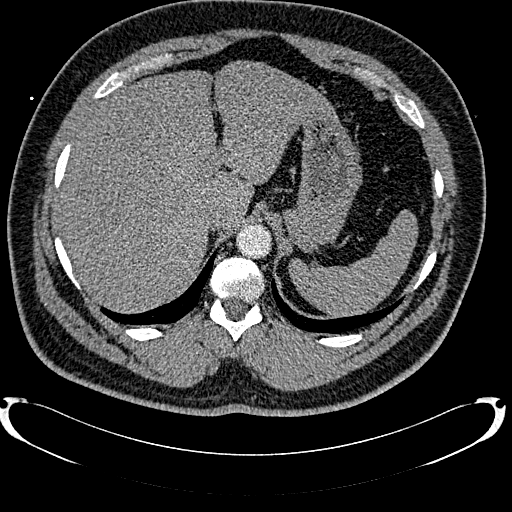
[im 56/277  lung]
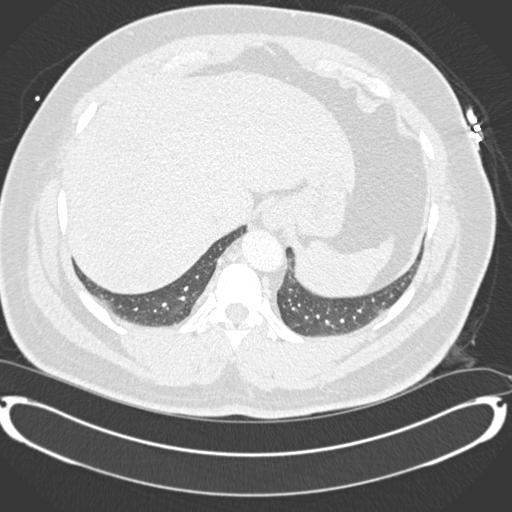
[im 74/277  mediastinal]
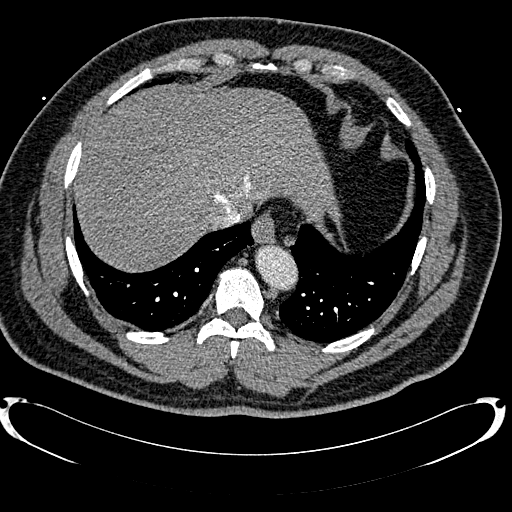
[im 93/277  lung]
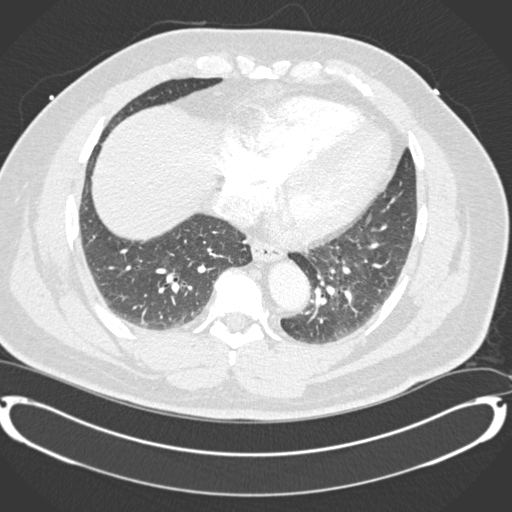
[im 111/277  mediastinal]
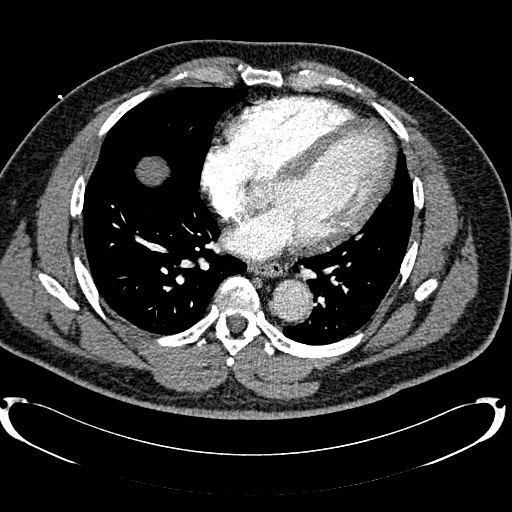
[im 129/277  lung]
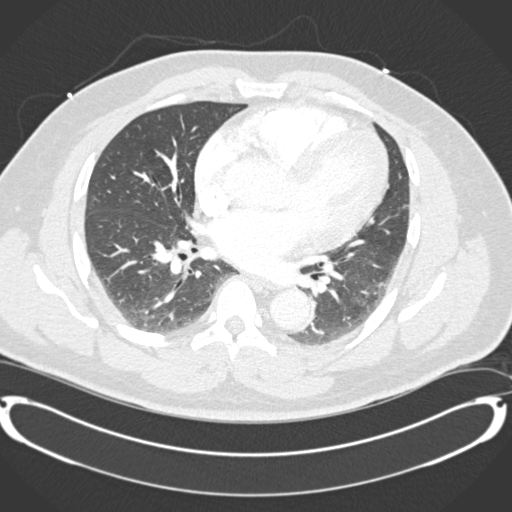
[im 148/277  mediastinal]
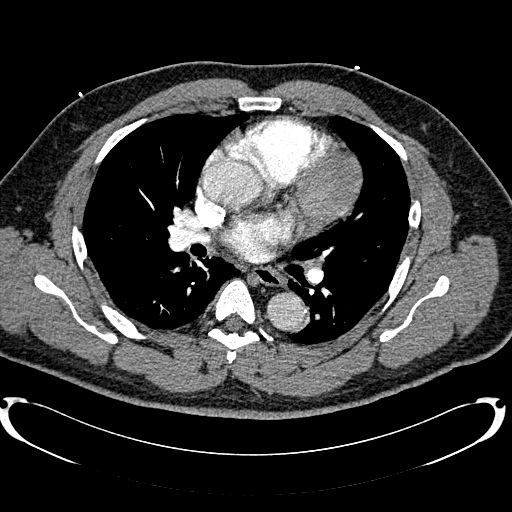
[im 166/277  lung]
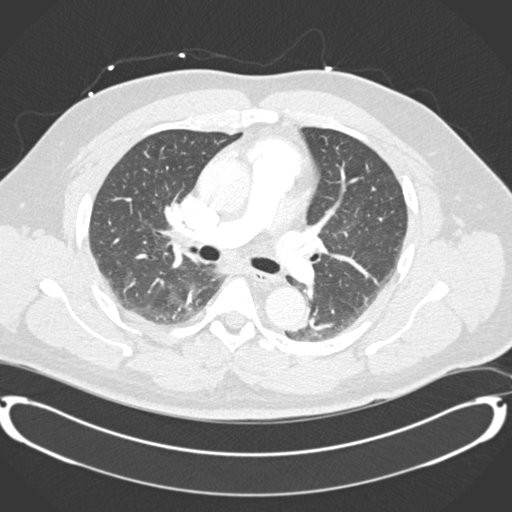
[im 185/277  mediastinal]
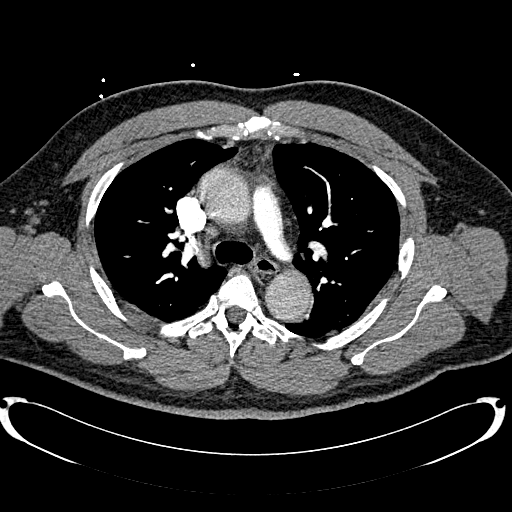
[im 203/277  lung]
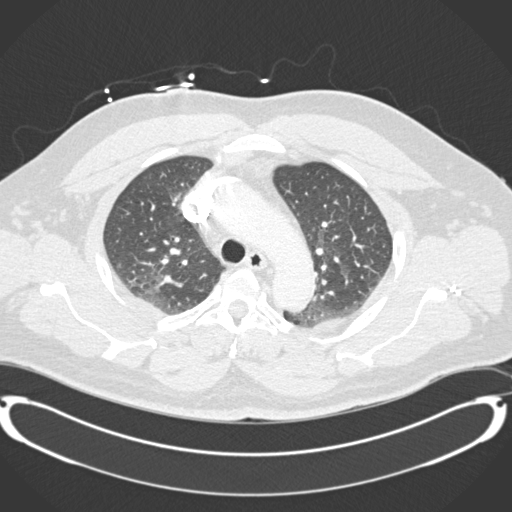
[im 221/277  mediastinal]
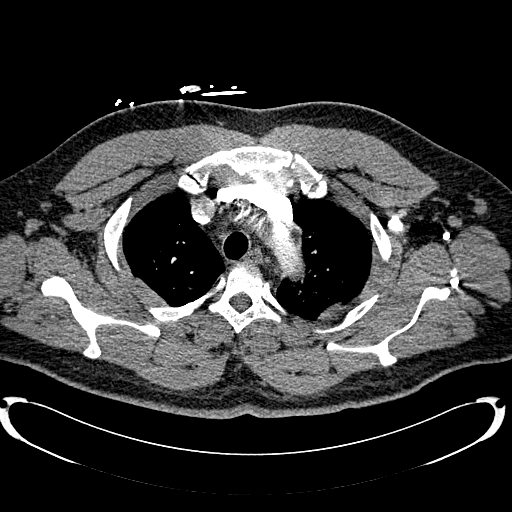
[im 240/277  lung]
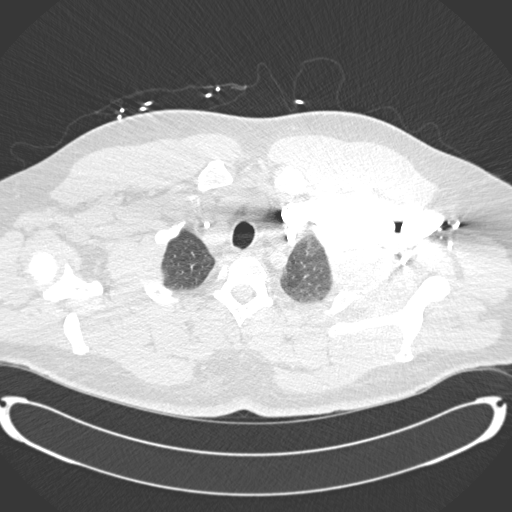
[im 258/277  mediastinal]
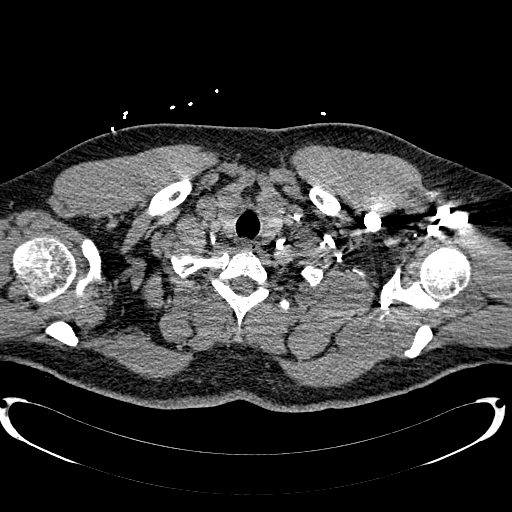

[Series 602: cor · coronal · 0.78mm/px · 1 of 115 slices shown]
[im 58/115  mediastinal]
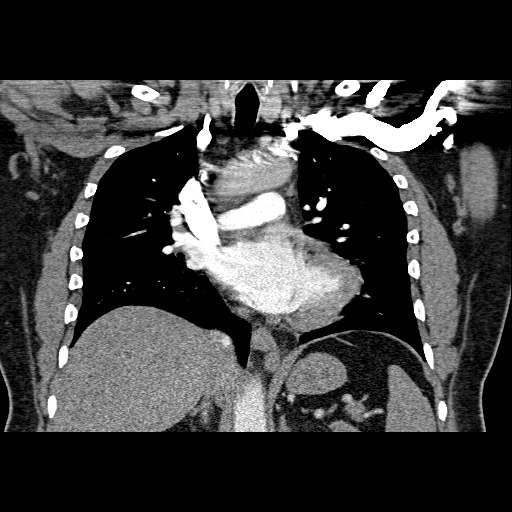

[18 of 36 positions shown; findings below may reference images not displayed]

FINDINGS: There are no filling defects in the pulmonary arterial
tree to suggest acute pulmonary thromboembolism.

Negative abnormal mediastinal adenopathy.  Negative pericardial
effusion.

No pneumothorax or effusion.

Patchy ground-glass opacities in both lungs likely reflect volume
loss.  No mass or consolidation.

Sub centimeter hypodensity in the right lobe of the liver on image
88 is nonspecific.

No acute bony deformity.
IMPRESSION: No evidence of acute pulmonary thromboembolism.

Patchy atelectasis bilaterally.

Sub centimeter hypodensity in the liver statistically benign.
Consider [DATE]-month follow-up.

## 2010-12-15 LAB — CK TOTAL AND CKMB (NOT AT ARMC)
CK, MB: 2.5 ng/mL (ref 0.3–4.0)
CK, MB: 2.6 ng/mL (ref 0.3–4.0)
CK, MB: 2.7 ng/mL (ref 0.3–4.0)
Relative Index: 0.7 (ref 0.0–2.5)
Relative Index: 0.7 (ref 0.0–2.5)
Relative Index: 0.7 (ref 0.0–2.5)
Total CK: 371 U/L — ABNORMAL HIGH (ref 7–232)
Total CK: 378 U/L — ABNORMAL HIGH (ref 7–232)
Total CK: 408 U/L — ABNORMAL HIGH (ref 7–232)

## 2010-12-15 LAB — HEPATIC FUNCTION PANEL
ALT: 27 U/L (ref 0–53)
AST: 19 U/L (ref 0–37)
Albumin: 3.5 g/dL (ref 3.5–5.2)
Alkaline Phosphatase: 71 U/L (ref 39–117)
Bilirubin, Direct: <0.1 mg/dL (ref 0.0–0.3)
Total Bilirubin: 0.9 mg/dL (ref 0.3–1.2)
Total Protein: 6.5 g/dL (ref 6.0–8.3)

## 2010-12-15 LAB — BASIC METABOLIC PANEL
BUN: 17 mg/dL (ref 6–23)
CO2: 28 mEq/L (ref 19–32)
Calcium: 9.8 mg/dL (ref 8.4–10.5)
Chloride: 102 mEq/L (ref 96–112)
Creatinine, Ser: 1.3 mg/dL (ref 0.4–1.5)
GFR calc Af Amer: >60 mL/min (ref >60)
GFR calc non Af Amer: 58 mL/min — ABNORMAL LOW (ref >60)
Glucose, Bld: 92 mg/dL (ref 70–99)
Potassium: 4 mEq/L (ref 3.5–5.1)
Sodium: 141 mEq/L (ref 135–145)

## 2010-12-15 LAB — DIFFERENTIAL
Basophils Absolute: 0 10*3/uL (ref 0.0–0.1)
Basophils Relative: 0 % (ref 0–1)
Eosinophils Absolute: 0.5 10*3/uL (ref 0.0–0.7)
Eosinophils Relative: 6 % — ABNORMAL HIGH (ref 0–5)
Lymphocytes Relative: 39 % (ref 12–46)
Lymphs Abs: 3 10*3/uL (ref 0.7–4.0)
Monocytes Absolute: 0.5 10*3/uL (ref 0.1–1.0)
Monocytes Relative: 6 % (ref 3–12)
Neutro Abs: 3.8 10*3/uL (ref 1.7–7.7)
Neutrophils Relative %: 49 % (ref 43–77)

## 2010-12-15 LAB — CBC
HCT: 43 % (ref 39.0–52.0)
Hemoglobin: 14.1 g/dL (ref 13.0–17.0)
MCHC: 32.7 g/dL (ref 30.0–36.0)
MCV: 81.7 fL (ref 78.0–100.0)
Platelets: 281 10*3/uL (ref 150–400)
RBC: 5.27 MIL/uL (ref 4.22–5.81)
RDW: 14.2 % (ref 11.5–15.5)
WBC: 7.8 10*3/uL (ref 4.0–10.5)

## 2010-12-15 LAB — POCT TOXICOLOGY PANEL

## 2010-12-15 LAB — HEMOGLOBIN A1C
Hgb A1c MFr Bld: 6.1 % (ref 4.6–6.1)
Mean Plasma Glucose: 128 mg/dL

## 2010-12-15 LAB — CK
Total CK: 298 U/L — ABNORMAL HIGH (ref 7–232)
Total CK: 306 U/L — ABNORMAL HIGH (ref 7–232)

## 2010-12-15 LAB — LIPID PANEL
Cholesterol: 207 mg/dL — ABNORMAL HIGH (ref 0–200)
HDL: 27 mg/dL — ABNORMAL LOW (ref >39)
LDL Cholesterol: 120 mg/dL — ABNORMAL HIGH (ref 0–99)
Total CHOL/HDL Ratio: 7.7 RATIO
Triglycerides: 301 mg/dL — ABNORMAL HIGH (ref <150)
VLDL: 60 mg/dL — ABNORMAL HIGH (ref 0–40)

## 2010-12-15 LAB — TSH
TSH: 1.475 u[IU]/mL (ref 0.350–4.500)
TSH: 1.99 u[IU]/mL (ref 0.350–4.500)

## 2010-12-15 LAB — POCT CARDIAC MARKERS
CKMB, poc: 2.4 ng/mL (ref 1.0–8.0)
Myoglobin, poc: 191 ng/mL (ref 12–200)
Troponin i, poc: <0.05 ng/mL (ref 0.00–0.09)

## 2010-12-15 LAB — HOMOCYSTEINE: Homocysteine: 11.9 umol/L (ref 4.0–15.4)

## 2011-03-13 DIAGNOSIS — G459 Transient cerebral ischemic attack, unspecified: Secondary | ICD-10-CM

## 2011-03-13 HISTORY — DX: Transient cerebral ischemic attack, unspecified: G45.9

## 2011-11-06 ENCOUNTER — Encounter: Payer: Self-pay | Admitting: Cardiovascular Disease

## 2011-11-06 ENCOUNTER — Ambulatory Visit (INDEPENDENT_AMBULATORY_CARE_PROVIDER_SITE_OTHER): Payer: Self-pay | Admitting: Cardiovascular Disease

## 2011-11-06 VITALS — BP 126/83 | HR 84 | Ht 67.0 in | Wt 317.0 lb

## 2011-11-06 DIAGNOSIS — I2510 Atherosclerotic heart disease of native coronary artery without angina pectoris: Secondary | ICD-10-CM

## 2011-11-06 DIAGNOSIS — R079 Chest pain, unspecified: Secondary | ICD-10-CM

## 2011-11-06 NOTE — Progress Notes (Signed)
History of Present Illness: 56 yo male with history of DM, HTN, HLD, mild non-obstructive CAD by cath 2002, asthma, GERD here today for new cardiology visit. He had been seen by Dr. Jens Som in December 2009 but has not followed up since then. He had a cardiac cath in 2002 which showed 25% stenosis in the distal RCA. He was admitted to [facility] December 2009 with chest pain and ruled out for an MI with serial cardiac enzymes. Echo suggested possible RA/RV mass. He was seen in f/u by Dr. Jens Som in December 2009 and February 2010. followup echocardiogram, both to requantify his LV function and to rule out the right atrial mass. This was performed on March 31, 2008. He was found to have distal septal hypokinesis, but his ejection fraction is 55%. There was mild-to-moderate LVH. There was no mass noted across the tricuspid valve. The left atrium is mildly dilated. The ascending aorta measured 43 mm. Stress myoview December 2009 with ejection fraction of 48%. There was mild apical thinning, but no ischemia or scar. There was left ventricular enlargement. Cardiac MRI had been planned but f/u echo was normal in regards to RA/RV thickening so was not pursued.   He is here today for new pt visit (over 3 years since seen in our office). He first noticed discomfort in his right arm about three weeks. He has "achy" pains across his chest several chest several times per week. He describes worsening of chest pain with exertion. Some SOB and fatigue. Lost his wife last year and has gained weight.   Primary Care Physician: Hyacinth Meeker, CRNP  Last Lipid Profile: Total chol: 208  LDL 135  HDL 30  Past Medical History  Diagnosis Date  . HTN (hypertension)   . Diabetes mellitus   . GERD (gastroesophageal reflux disease)   . CAD (coronary artery disease)     Cardiac cath 2002 with 25% distal RCA stenosis.   . Hyperlipidemia   . Asthma     Past Surgical History  Procedure Date  . Vasectomy   . Nasal sinus  surgery     Current Outpatient Prescriptions  Medication Sig Dispense Refill  . albuterol (PROVENTIL HFA;VENTOLIN HFA) 108 (90 BASE) MCG/ACT inhaler Inhale 2 puffs into the lungs every 6 (six) hours as needed.      Marland Kitchen amLODipine (NORVASC) 10 MG tablet Take 10 mg by mouth daily.      Marland Kitchen aspirin 81 MG tablet Take 81 mg by mouth daily.      . cetirizine (ZYRTEC) 10 MG tablet Take 10 mg by mouth daily.      Marland Kitchen gemfibrozil (LOPID) 600 MG tablet Take 600 mg by mouth 2 (two) times daily before a meal.      . hydrALAZINE (APRESOLINE) 25 MG tablet Take 25 mg by mouth 2 (two) times daily.      Marland Kitchen lisinopril (PRINIVIL,ZESTRIL) 20 MG tablet Take 40 mg by mouth daily.      . metFORMIN (GLUCOPHAGE) 500 MG tablet Take 500 mg by mouth 2 (two) times daily with a meal.      . metoprolol tartrate (LOPRESSOR) 25 MG tablet Take 25 mg by mouth 2 (two) times daily.      . mometasone (NASONEX) 50 MCG/ACT nasal spray Place 2 sprays into the nose daily.      . naproxen (NAPROSYN) 500 MG tablet Take 500 mg by mouth 2 (two) times daily with a meal.      . pantoprazole (PROTONIX) 40 MG tablet  Take 40 mg by mouth daily.      . pravastatin (PRAVACHOL) 20 MG tablet Take 20 mg by mouth daily.        Allergies  Allergen Reactions  . Codeine     History   Social History  . Marital Status: Widowed    Spouse Name: N/A    Number of Children: N/A  . Years of Education: N/A   Occupational History  . Not on file.   Social History Main Topics  . Smoking status: Not on file  . Smokeless tobacco: Not on file  . Alcohol Use: Not on file  . Drug Use: Not on file  . Sexually Active: Not on file   Other Topics Concern  . Not on file   Social History Narrative  . No narrative on file    Family History  Problem Relation Age of Onset  . Heart attack Mother     Review of Systems:  As stated in the HPI and otherwise negative.   BP 126/83  Pulse 84  Ht 5\' 7"  (1.702 m)  Wt 317 lb (143.79 kg)  BMI 49.65  kg/m2  Physical Examination: General: Well developed, well nourished, NAD HEENT: OP clear, mucus membranes moist SKIN: warm, dry. No rashes. Neuro: No focal deficits Musculoskeletal: Muscle strength 5/5 all ext Psychiatric: Mood and affect normal Neck: No JVD, no carotid bruits, no thyromegaly, no lymphadenopathy. Lungs:Clear bilaterally, no wheezes, rhonci, crackles Cardiovascular: Regular rate and rhythm. No murmurs, gallops or rubs. Abdomen:Soft. Bowel sounds present. Non-tender.  Extremities: No lower extremity edema. Pulses are 2 + in the bilateral DP/PT.  EKG: NSR, rate 84 bpm. Normal EKG  Assessment and Plan:   1. Chest pain: He has exertional chest pain and has a history of minor CAD by cath in 2002, DM, HTN, HLD, obesity and former tobacco abuse. Will arrange Lexiscan myoview to exclude ischemia and echo to exclude structural heart abnormality.   2. Right atrial/RV mass: Pt evaluated by Dr. Jens Som for abnormality in the RA/RV in 2009. Not there on f/u echo 2010. Echo to reassess.    3. CAD: He is known to have mild CAD by cath in 2009. Now with chest pain. Stress test to assess for ischemia.

## 2011-11-06 NOTE — Patient Instructions (Addendum)
Your physician recommends that you schedule a follow-up appointment in:  3-4 weeks.   Your physician has requested that you have a lexiscan myoview. For further information please visit [email or web address] Please follow instruction sheet, as given.   Your physician has requested that you have an echocardiogram. Echocardiography is a painless test that uses sound waves to create images of your heart. It provides your doctor with information about the size and shape of your heart and how well your heart's chambers and valves are working. This procedure takes approximately one hour. There are no restrictions for this procedure.

## 2011-11-08 ENCOUNTER — Ambulatory Visit (HOSPITAL_COMMUNITY): Payer: Self-pay | Attending: Cardiovascular Disease

## 2011-11-08 DIAGNOSIS — I10 Essential (primary) hypertension: Secondary | ICD-10-CM | POA: Insufficient documentation

## 2011-11-08 DIAGNOSIS — I2510 Atherosclerotic heart disease of native coronary artery without angina pectoris: Secondary | ICD-10-CM | POA: Insufficient documentation

## 2011-11-08 DIAGNOSIS — I379 Nonrheumatic pulmonary valve disorder, unspecified: Secondary | ICD-10-CM | POA: Insufficient documentation

## 2011-11-08 DIAGNOSIS — R072 Precordial pain: Secondary | ICD-10-CM | POA: Insufficient documentation

## 2011-11-08 DIAGNOSIS — E119 Type 2 diabetes mellitus without complications: Secondary | ICD-10-CM | POA: Insufficient documentation

## 2011-11-08 DIAGNOSIS — I079 Rheumatic tricuspid valve disease, unspecified: Secondary | ICD-10-CM | POA: Insufficient documentation

## 2011-11-08 DIAGNOSIS — I059 Rheumatic mitral valve disease, unspecified: Secondary | ICD-10-CM | POA: Insufficient documentation

## 2011-11-08 DIAGNOSIS — R079 Chest pain, unspecified: Secondary | ICD-10-CM

## 2011-11-08 NOTE — Progress Notes (Signed)
Echocardiogram performed.  

## 2011-11-14 ENCOUNTER — Ambulatory Visit (HOSPITAL_COMMUNITY): Payer: Self-pay | Attending: Cardiovascular Disease | Admitting: Radiology

## 2011-11-14 VITALS — BP 114/84 | Ht 67.0 in | Wt 320.0 lb

## 2011-11-14 DIAGNOSIS — Z87891 Personal history of nicotine dependence: Secondary | ICD-10-CM | POA: Insufficient documentation

## 2011-11-14 DIAGNOSIS — R0602 Shortness of breath: Secondary | ICD-10-CM | POA: Insufficient documentation

## 2011-11-14 DIAGNOSIS — R079 Chest pain, unspecified: Secondary | ICD-10-CM | POA: Insufficient documentation

## 2011-11-14 DIAGNOSIS — E119 Type 2 diabetes mellitus without complications: Secondary | ICD-10-CM | POA: Insufficient documentation

## 2011-11-14 DIAGNOSIS — I10 Essential (primary) hypertension: Secondary | ICD-10-CM | POA: Insufficient documentation

## 2011-11-14 DIAGNOSIS — I2510 Atherosclerotic heart disease of native coronary artery without angina pectoris: Secondary | ICD-10-CM | POA: Insufficient documentation

## 2011-11-14 MED ORDER — TECHNETIUM TC 99M TETROFOSMIN IV KIT
30.0000 | PACK | Freq: Once | INTRAVENOUS | Status: AC | PRN
  Administered 2011-11-14: 30 via INTRAVENOUS

## 2011-11-14 MED ORDER — REGADENOSON 0.4 MG/5ML IV SOLN
0.4000 mg | Freq: Once | INTRAVENOUS | Status: AC
  Administered 2011-11-14: 0.4 mg via INTRAVENOUS

## 2011-11-14 NOTE — Progress Notes (Signed)
[facility] SITE 3 NUCLEAR MED [address] [place] [address] [phone]  Cardiology Nuclear Med Study  Alex [name] is a 56 y.o. male     MRN : [id number]     DOB: 08/18/1955  Procedure Date: 11/14/2011  Nuclear Med Background Indication for Stress Test:  Evaluation for Ischemia History:  Asthma and '02 Heart Cath: mild N/O CAD '09 MPS: NL EF: 48% 1/10 ECHO: EF: 55% No Mass RV/RA mass on '09 ECHO? Cardiac Risk Factors: History of Smoking, Hypertension, Lipids, NIDDM and Obesity  Symptoms:  Chest Pain, SOB, Fatigue   Nuclear Pre-Procedure Caffeine/Decaff Intake:  None > 12 hrs NPO After: 9:00pm   Lungs:  clear O2 Sat: 94% on room air. IV 0.9% NS with Angio Cath:  22g  IV Site: R Forearm x 1, tolerated well IV Started by:  Irean Hong, RN  Chest Size (in):  52 Cup Size: n/a  Height: 5\' 7"  (1.702 m)  Weight:  320 lb (145.151 kg)  BMI:  Body mass index is 50.12 kg/(m^2). Tech Comments:  Last dose of  Lopressor was last night    Nuclear Med Study 1 or 2 day study: 2 day  Stress Test Type:  Lexiscan  Reading MD: Kristeen Miss, MD  Order Authorizing Provider:  Verne Carrow, MD  Resting Radionuclide: Technetium 24m Tetrofosmin  Resting Radionuclide Dose: 33.0 mCi on 11/15/11   Stress Radionuclide:  Technetium 100m Tetrofosmin  Stress Radionuclide Dose: 33.0 mCi on 11/14/11           Stress Protocol Rest HR: 92 Stress HR: 107  Rest BP: 114/84 Stress BP: 130/93  Exercise Time (min): n/a METS: n/a   Predicted Max HR: 164 bpm % Max HR: 65.24 bpm Rate Pressure Product: 03474   Dose of Adenosine (mg):  n/a Dose of Lexiscan: 0.4 mg  Dose of Atropine (mg): n/a Dose of Dobutamine: n/a mcg/kg/min (at max HR)  Stress Test Technologist: Milana Na, EMT-P  Nuclear Technologist:  Domenic Polite, CNMT     Rest Procedure:  Myocardial perfusion imaging was performed at rest 45 minutes following the intravenous administration of Technetium 51m  Tetrofosmin. Rest ECG: NSR - Normal EKG  Stress Procedure:  The patient received IV Lexiscan 0.4 mg over 15-seconds.  Technetium 50m Tetrofosmin injected at 30-seconds.  There were no significant changes, sob, and abdominal pain with Lexiscan.  Quantitative spect images were obtained after a 45 minute delay. Stress ECG: No significant change from baseline ECG  QPS Raw Data Images:  Normal; no motion artifact; normal heart/lung ratio. Stress Images:  Normal homogeneous uptake in all areas of the myocardium. Rest Images:  Normal homogeneous uptake in all areas of the myocardium. Subtraction (SDS):  No evidence of ischemia. Transient Ischemic Dilatation (Normal <1.22):  1.10 Lung/Heart Ratio (Normal <0.45):  0.44  Quantitative Gated Spect Images QGS EDV:  121 ml QGS ESV:  55 ml  Impression Exercise Capacity:  Lexiscan with no exercise. BP Response:  Normal blood pressure response. Clinical Symptoms:  There is dyspnea. ECG Impression:  No significant ST segment change suggestive of ischemia. Comparison with Prior Nuclear Study: No images to compare  Overall Impression:  Normal stress nuclear study.  LV Ejection Fraction: 55%.  LV Wall Motion:  NL LV Function; NL Wall Motion   [employer]

## 2011-11-15 ENCOUNTER — Ambulatory Visit (HOSPITAL_COMMUNITY): Payer: Self-pay | Attending: Cardiology | Admitting: Radiology

## 2011-11-15 DIAGNOSIS — R0989 Other specified symptoms and signs involving the circulatory and respiratory systems: Secondary | ICD-10-CM

## 2011-11-15 MED ORDER — TECHNETIUM TC 99M TETROFOSMIN IV KIT
33.0000 | PACK | Freq: Once | INTRAVENOUS | Status: AC | PRN
  Administered 2011-11-15: 33 via INTRAVENOUS

## 2011-12-06 ENCOUNTER — Encounter: Payer: Self-pay | Admitting: Cardiovascular Disease

## 2011-12-06 ENCOUNTER — Ambulatory Visit (INDEPENDENT_AMBULATORY_CARE_PROVIDER_SITE_OTHER): Payer: Self-pay | Admitting: Cardiovascular Disease

## 2011-12-06 VITALS — BP 140/80 | HR 84 | Ht 67.0 in | Wt 320.0 lb

## 2011-12-06 DIAGNOSIS — I517 Cardiomegaly: Secondary | ICD-10-CM

## 2011-12-06 DIAGNOSIS — I5032 Chronic diastolic (congestive) heart failure: Secondary | ICD-10-CM

## 2011-12-06 DIAGNOSIS — I2510 Atherosclerotic heart disease of native coronary artery without angina pectoris: Secondary | ICD-10-CM

## 2011-12-06 MED ORDER — FUROSEMIDE 40 MG PO TABS: 40.0000 mg | ORAL_TABLET | Freq: Every day | ORAL | Status: DC

## 2011-12-06 NOTE — Patient Instructions (Addendum)
Your physician wants you to follow-up in:  6 months. You will receive a reminder letter in the mail two months in advance. If you don't receive a letter, please call our office to schedule the follow-up appointment.  Your physician has recommended you make the following change in your medication:  Start furosemide 40 mg by mouth daily  Your physician recommends that you return for lab work on December 14, 2011--BMP

## 2011-12-06 NOTE — Progress Notes (Signed)
History of Present Illness: 56 yo male with history of DM, HTN, HLD, mild non-obstructive CAD by cath 2002, asthma, GERD here today for cardiac follow up. He was seen as a new patient 11/06/11.  He had been seen by Dr. Jens Som in December 2009 but has not followed up since then. He had a cardiac cath in 2002 which showed 25% stenosis in the distal RCA. He was admitted to [facility] December 2009 with chest pain and ruled out for an MI with serial cardiac enzymes. Echo suggested possible RA/RV mass. He was seen in f/u by Dr. Jens Som in December 2009 and February 2010 with a follow-up echocardiogram, both to requantify his LV function and to rule out the right atrial mass. This was performed on March 31, 2008. He was found to have distal septal hypokinesis, but his ejection fraction is 55%. There was mild-to-moderate LVH. There was no mass noted across the tricuspid valve. The left atrium is mildly dilated. The ascending aorta measured 43 mm. Stress myoview December 2009 with ejection fraction of 48%. There was mild apical thinning, but no ischemia or scar. There was left ventricular enlargement. Cardiac MRI had been planned but f/u echo was normal in regards to RA/RV thickening so was not pursued. At his first visit here on 11/06/11, he told me that he had been having pain in his right arm for 3 weeks as well as pains in his chest several times per week. There was associated SOB and fatigue. I arranged an echo on 11/08/11 which showed normal LV size and function with LVEF of 55-65% and moderate LVH. There were no significant valvular issues. Lexiscan myoview on 11/15/11 with no inducible ischemia.   He is here for f/u. Feeling well. Wants to start exercising and lose weight.    Primary Care Physician: Hyacinth Meeker, CRNP   Last Lipid Profile: Followed in primary care.    Past Medical History  Diagnosis Date  . HTN (hypertension)   . Diabetes mellitus   . GERD (gastroesophageal reflux disease)   . CAD  (coronary artery disease)     Cardiac cath 2002 with 25% distal RCA stenosis.   . Hyperlipidemia   . Asthma     Past Surgical History  Procedure Date  . Vasectomy   . Nasal sinus surgery     Current Outpatient Prescriptions  Medication Sig Dispense Refill  . albuterol (PROVENTIL HFA;VENTOLIN HFA) 108 (90 BASE) MCG/ACT inhaler Inhale 2 puffs into the lungs every 6 (six) hours as needed.      Marland Kitchen amLODipine (NORVASC) 10 MG tablet Take 10 mg by mouth daily.      Marland Kitchen aspirin 81 MG tablet Take 81 mg by mouth daily.      . cetirizine (ZYRTEC) 10 MG tablet Take 10 mg by mouth daily.      Marland Kitchen gemfibrozil (LOPID) 600 MG tablet Take 600 mg by mouth 2 (two) times daily before a meal.      . hydrALAZINE (APRESOLINE) 25 MG tablet Take 25 mg by mouth 2 (two) times daily.      Marland Kitchen lisinopril (PRINIVIL,ZESTRIL) 20 MG tablet Take 40 mg by mouth daily.      . metFORMIN (GLUCOPHAGE) 500 MG tablet Take 500 mg by mouth 2 (two) times daily with a meal.      . metoprolol tartrate (LOPRESSOR) 25 MG tablet Take 25 mg by mouth 2 (two) times daily.      . mometasone (NASONEX) 50 MCG/ACT nasal spray Place 2 sprays into  the nose daily.      . naproxen (NAPROSYN) 500 MG tablet Take 500 mg by mouth 2 (two) times daily with a meal.      . pantoprazole (PROTONIX) 40 MG tablet Take 40 mg by mouth daily.      . pravastatin (PRAVACHOL) 20 MG tablet Take 20 mg by mouth daily.        Allergies  Allergen Reactions  . Codeine     History   Social History  . Marital Status: Widowed    Spouse Name: N/A    Number of Children: N/A  . Years of Education: N/A   Occupational History  . Disability    Social History Main Topics  . Smoking status: Former Smoker -- 1.0 packs/day for 10 years    Types: Cigarettes    Quit date: 11/06/1991  . Smokeless tobacco: Not on file   Comment: quit 1993  . Alcohol Use: No  . Drug Use: No  . Sexually Active: Not on file   Other Topics Concern  . Not on file   Social History  Narrative  . No narrative on file    Family History  Problem Relation Age of Onset  . Heart attack Mother     Review of Systems:  As stated in the HPI and otherwise negative.   BP 140/80  Pulse 84  Ht 5\' 7"  (1.702 m)  Wt 320 lb (145.151 kg)  BMI 50.12 kg/m2  Physical Examination: General: Well developed, well nourished, NAD HEENT: OP clear, mucus membranes moist SKIN: warm, dry. No rashes. Neuro: No focal deficits Musculoskeletal: Muscle strength 5/5 all ext Psychiatric: Mood and affect normal Neck: No JVD, no carotid bruits, no thyromegaly, no lymphadenopathy. Lungs:Clear bilaterally, no wheezes, rhonci, crackles Cardiovascular: Regular rate and rhythm. No murmurs, gallops or rubs. Abdomen:Distended. BS present.  Extremities: No lower extremity edema. Pulses are 2 + in the bilateral DP/PT.  Echo 11/08/11:  Left ventricle: The cavity size was normal. Wall thickness was increased in a pattern of moderate LVH. Systolic function was normal. The estimated ejection fraction was in the range of 55% to 65%. Wall motion was normal; there were no regional wall motion abnormalities. Doppler parameters are consistent with abnormal left ventricular relaxation (grade 1 diastolic dysfunction). - Pulmonary arteries: Systolic pressure was mildly increased. PA peak pressure: 39mm Hg (S).  Lexiscan Stress Myoview 11/15/11:  Stress Procedure: The patient received IV Lexiscan 0.4 mg over 15-seconds. Technetium 41m Tetrofosmin injected at 30-seconds. There were no significant changes, sob, and abdominal pain with Lexiscan. Quantitative spect images were obtained after a 45 minute delay.  Stress ECG: No significant change from baseline ECG  QPS  Raw Data Images: Normal; no motion artifact; normal heart/lung ratio.  Stress Images: Normal homogeneous uptake in all areas of the myocardium.  Rest Images: Normal homogeneous uptake in all areas of the myocardium.  Subtraction (SDS): No evidence of  ischemia.  Transient Ischemic Dilatation (Normal <1.22): 1.10  Lung/Heart Ratio (Normal <0.45): 0.44  Quantitative Gated Spect Images  QGS EDV: 121 ml  QGS ESV: 55 ml  Impression  Exercise Capacity: Lexiscan with no exercise.  BP Response: Normal blood pressure response.  Clinical Symptoms: There is dyspnea.  ECG Impression: No significant ST segment change suggestive of ischemia.  Comparison with Prior Nuclear Study: No images to compare  Overall Impression: Normal stress nuclear study.    Assessment and Plan:   1. Chest pain: He is known to have mild CAD by cath in  2002. Recent chest pain. Stress test normal. Echo with moderate LVH. No further ischemic workup at this time.   2. Right atrial/RV mass: Pt evaluated by Dr. Jens Som for abnormality in the RA/RV in 2009. Not there on f/u echo 2010. No evidence of RA/RV mass on repeat echo  3. CAD: Stable, mild disease by cath 2002. No ischemia on recent stress test.   4. Chronic diastolic heart failure: Will add Lasix 40 mg po QDaily. Recheck BMET 10 days.

## 2011-12-07 ENCOUNTER — Telehealth: Payer: Self-pay | Admitting: Cardiovascular Disease

## 2011-12-07 NOTE — Telephone Encounter (Signed)
F/u  Calling back regarding cramps. Any suggestion on medication.

## 2011-12-07 NOTE — Telephone Encounter (Signed)
Plz return call to patient at [phone] regarding side affects from medication prescribed on 9/26 visit.  Pt said he has cramps.  plz return call to discuss medication.

## 2011-12-07 NOTE — Telephone Encounter (Signed)
Pt states he has tried lasix prior and tried again- it causes hand, stomach cramps. Can you suggest a different medication? Please advise.

## 2011-12-07 NOTE — Telephone Encounter (Signed)
Pt informed Dr Clifton James is in the cath lab today and he may not hear back till Monday. I told pt to hold lasix and wait till Dr makes recommendation. Pt agreeable to not taking lasix again till advised.

## 2011-12-07 NOTE — Telephone Encounter (Signed)
OK to stop Lasix. cdm

## 2011-12-14 ENCOUNTER — Other Ambulatory Visit (INDEPENDENT_AMBULATORY_CARE_PROVIDER_SITE_OTHER): Payer: Self-pay

## 2011-12-14 DIAGNOSIS — I5032 Chronic diastolic (congestive) heart failure: Secondary | ICD-10-CM

## 2011-12-14 LAB — BASIC METABOLIC PANEL
BUN: 17 mg/dL (ref 6–23)
CO2: 26 mEq/L (ref 19–32)
Calcium: 9.5 mg/dL (ref 8.4–10.5)
Chloride: 103 mEq/L (ref 96–112)
Creatinine, Ser: 1.3 mg/dL (ref 0.4–1.5)
GFR: 72.63 mL/min (ref >60.00)
Glucose, Bld: 85 mg/dL (ref 70–99)
Potassium: 3.6 mEq/L (ref 3.5–5.1)
Sodium: 138 mEq/L (ref 135–145)

## 2011-12-17 ENCOUNTER — Other Ambulatory Visit: Payer: Self-pay | Admitting: *Deleted

## 2011-12-17 DIAGNOSIS — E875 Hyperkalemia: Secondary | ICD-10-CM

## 2011-12-17 MED ORDER — POTASSIUM CHLORIDE CRYS ER 20 MEQ PO TBCR: 20.0000 meq | EXTENDED_RELEASE_TABLET | Freq: Every day | ORAL | Status: DC

## 2011-12-24 ENCOUNTER — Emergency Department (HOSPITAL_COMMUNITY): Payer: Medicaid Other

## 2011-12-24 ENCOUNTER — Observation Stay (HOSPITAL_COMMUNITY)
Admission: EM | Admit: 2011-12-24 | Discharge: 2011-12-25 | Disposition: A | Discharge status: Home / Self Care | Payer: Medicaid Other | Attending: Internal Medicine | Admitting: Internal Medicine

## 2011-12-24 ENCOUNTER — Encounter (HOSPITAL_COMMUNITY): Payer: Self-pay | Admitting: *Deleted

## 2011-12-24 DIAGNOSIS — M25519 Pain in unspecified shoulder: Secondary | ICD-10-CM

## 2011-12-24 DIAGNOSIS — H539 Unspecified visual disturbance: Secondary | ICD-10-CM

## 2011-12-24 DIAGNOSIS — I152 Hypertension secondary to endocrine disorders: Secondary | ICD-10-CM | POA: Diagnosis present

## 2011-12-24 DIAGNOSIS — K047 Periapical abscess without sinus: Secondary | ICD-10-CM

## 2011-12-24 DIAGNOSIS — K222 Esophageal obstruction: Secondary | ICD-10-CM

## 2011-12-24 DIAGNOSIS — E669 Obesity, unspecified: Secondary | ICD-10-CM

## 2011-12-24 DIAGNOSIS — R4701 Aphasia: Secondary | ICD-10-CM | POA: Diagnosis present

## 2011-12-24 DIAGNOSIS — M109 Gout, unspecified: Secondary | ICD-10-CM

## 2011-12-24 DIAGNOSIS — I10 Essential (primary) hypertension: Secondary | ICD-10-CM | POA: Diagnosis present

## 2011-12-24 DIAGNOSIS — I2510 Atherosclerotic heart disease of native coronary artery without angina pectoris: Secondary | ICD-10-CM

## 2011-12-24 DIAGNOSIS — I252 Old myocardial infarction: Secondary | ICD-10-CM | POA: Insufficient documentation

## 2011-12-24 DIAGNOSIS — Z7982 Long term (current) use of aspirin: Secondary | ICD-10-CM | POA: Insufficient documentation

## 2011-12-24 DIAGNOSIS — M5137 Other intervertebral disc degeneration, lumbosacral region: Secondary | ICD-10-CM

## 2011-12-24 DIAGNOSIS — R748 Abnormal levels of other serum enzymes: Secondary | ICD-10-CM

## 2011-12-24 DIAGNOSIS — F39 Unspecified mood [affective] disorder: Secondary | ICD-10-CM

## 2011-12-24 DIAGNOSIS — R29898 Other symptoms and signs involving the musculoskeletal system: Secondary | ICD-10-CM | POA: Insufficient documentation

## 2011-12-24 DIAGNOSIS — G459 Transient cerebral ischemic attack, unspecified: Principal | ICD-10-CM | POA: Diagnosis present

## 2011-12-24 DIAGNOSIS — Z8249 Family history of ischemic heart disease and other diseases of the circulatory system: Secondary | ICD-10-CM | POA: Insufficient documentation

## 2011-12-24 DIAGNOSIS — K219 Gastro-esophageal reflux disease without esophagitis: Secondary | ICD-10-CM | POA: Insufficient documentation

## 2011-12-24 DIAGNOSIS — E785 Hyperlipidemia, unspecified: Secondary | ICD-10-CM | POA: Diagnosis present

## 2011-12-24 DIAGNOSIS — G4700 Insomnia, unspecified: Secondary | ICD-10-CM

## 2011-12-24 DIAGNOSIS — K921 Melena: Secondary | ICD-10-CM

## 2011-12-24 DIAGNOSIS — R51 Headache: Secondary | ICD-10-CM

## 2011-12-24 DIAGNOSIS — E1159 Type 2 diabetes mellitus with other circulatory complications: Secondary | ICD-10-CM | POA: Diagnosis present

## 2011-12-24 DIAGNOSIS — Z79899 Other long term (current) drug therapy: Secondary | ICD-10-CM | POA: Insufficient documentation

## 2011-12-24 DIAGNOSIS — M6282 Rhabdomyolysis: Secondary | ICD-10-CM

## 2011-12-24 DIAGNOSIS — M549 Dorsalgia, unspecified: Secondary | ICD-10-CM

## 2011-12-24 DIAGNOSIS — R079 Chest pain, unspecified: Secondary | ICD-10-CM

## 2011-12-24 DIAGNOSIS — J45909 Unspecified asthma, uncomplicated: Secondary | ICD-10-CM | POA: Insufficient documentation

## 2011-12-24 DIAGNOSIS — I739 Peripheral vascular disease, unspecified: Secondary | ICD-10-CM

## 2011-12-24 DIAGNOSIS — E7800 Pure hypercholesterolemia, unspecified: Secondary | ICD-10-CM

## 2011-12-24 DIAGNOSIS — E119 Type 2 diabetes mellitus without complications: Secondary | ICD-10-CM | POA: Insufficient documentation

## 2011-12-24 DIAGNOSIS — M51379 Other intervertebral disc degeneration, lumbosacral region without mention of lumbar back pain or lower extremity pain: Secondary | ICD-10-CM

## 2011-12-24 HISTORY — DX: Acute myocardial infarction, unspecified: I21.9

## 2011-12-24 LAB — TSH: TSH: 1.524 u[IU]/mL (ref 0.350–4.500)

## 2011-12-24 LAB — COMPREHENSIVE METABOLIC PANEL
ALT: 31 U/L (ref 0–53)
ALT: 35 U/L (ref 0–53)
AST: 38 U/L — ABNORMAL HIGH (ref 0–37)
AST: 40 U/L — ABNORMAL HIGH (ref 0–37)
Albumin: 3.7 g/dL (ref 3.5–5.2)
Albumin: 4 g/dL (ref 3.5–5.2)
Alkaline Phosphatase: 68 U/L (ref 39–117)
Alkaline Phosphatase: 72 U/L (ref 39–117)
BUN: 18 mg/dL (ref 6–23)
BUN: 18 mg/dL (ref 6–23)
CO2: 26 mEq/L (ref 19–32)
CO2: 22 mEq/L (ref 19–32)
Calcium: 9.8 mg/dL (ref 8.4–10.5)
Calcium: 9.9 mg/dL (ref 8.4–10.5)
Chloride: 99 mEq/L (ref 96–112)
Chloride: 100 mEq/L (ref 96–112)
Creatinine, Ser: 1.5 mg/dL — ABNORMAL HIGH (ref 0.50–1.35)
Creatinine, Ser: 1.31 mg/dL (ref 0.50–1.35)
GFR calc Af Amer: 58 mL/min — ABNORMAL LOW (ref >90)
GFR calc Af Amer: 69 mL/min — ABNORMAL LOW (ref >90)
GFR calc non Af Amer: 50 mL/min — ABNORMAL LOW (ref >90)
GFR calc non Af Amer: 59 mL/min — ABNORMAL LOW (ref >90)
Glucose, Bld: 170 mg/dL — ABNORMAL HIGH (ref 70–99)
Glucose, Bld: 130 mg/dL — ABNORMAL HIGH (ref 70–99)
Potassium: 4.1 mEq/L (ref 3.5–5.1)
Potassium: 4.6 mEq/L (ref 3.5–5.1)
Sodium: 137 mEq/L (ref 135–145)
Sodium: 135 mEq/L (ref 135–145)
Total Bilirubin: 0.5 mg/dL (ref 0.3–1.2)
Total Bilirubin: 0.4 mg/dL (ref 0.3–1.2)
Total Protein: 7.7 g/dL (ref 6.0–8.3)
Total Protein: 8 g/dL (ref 6.0–8.3)

## 2011-12-24 LAB — CBC WITH DIFFERENTIAL/PLATELET
Basophils Absolute: 0.1 10*3/uL (ref 0.0–0.1)
Basophils Relative: 1 % (ref 0–1)
Eosinophils Absolute: 0.8 10*3/uL — ABNORMAL HIGH (ref 0.0–0.7)
Eosinophils Relative: 9 % — ABNORMAL HIGH (ref 0–5)
HCT: 41.2 % (ref 39.0–52.0)
Hemoglobin: 13.9 g/dL (ref 13.0–17.0)
Lymphocytes Relative: 40 % (ref 12–46)
Lymphs Abs: 3.8 10*3/uL (ref 0.7–4.0)
MCH: 27.5 pg (ref 26.0–34.0)
MCHC: 33.7 g/dL (ref 30.0–36.0)
MCV: 81.6 fL (ref 78.0–100.0)
Monocytes Absolute: 0.5 10*3/uL (ref 0.1–1.0)
Monocytes Relative: 5 % (ref 3–12)
Neutro Abs: 4.3 10*3/uL (ref 1.7–7.7)
Neutrophils Relative %: 45 % (ref 43–77)
Platelets: 307 10*3/uL (ref 150–400)
RBC: 5.05 MIL/uL (ref 4.22–5.81)
RDW: 14.7 % (ref 11.5–15.5)
WBC: 9.5 10*3/uL (ref 4.0–10.5)

## 2011-12-24 LAB — RAPID URINE DRUG SCREEN, HOSP PERFORMED
Amphetamines: NOT DETECTED
Barbiturates: NOT DETECTED
Benzodiazepines: NOT DETECTED
Cocaine: NOT DETECTED
Opiates: NOT DETECTED
Tetrahydrocannabinol: NOT DETECTED

## 2011-12-24 LAB — TROPONIN I
Troponin I: <0.3 ng/mL (ref <0.30)
Troponin I: <0.3 ng/mL (ref <0.30)
Troponin I: <0.3 ng/mL (ref <0.30)
Troponin I: <0.3 ng/mL (ref <0.30)

## 2011-12-24 LAB — GLUCOSE, CAPILLARY
Glucose-Capillary: 137 mg/dL — ABNORMAL HIGH (ref 70–99)
Glucose-Capillary: 133 mg/dL — ABNORMAL HIGH (ref 70–99)
Glucose-Capillary: 121 mg/dL — ABNORMAL HIGH (ref 70–99)
Glucose-Capillary: 132 mg/dL — ABNORMAL HIGH (ref 70–99)
Glucose-Capillary: 108 mg/dL — ABNORMAL HIGH (ref 70–99)

## 2011-12-24 LAB — HEMOGLOBIN A1C
Hgb A1c MFr Bld: 7.1 % — ABNORMAL HIGH (ref <5.7)
Mean Plasma Glucose: 157 mg/dL — ABNORMAL HIGH (ref <117)

## 2011-12-24 LAB — CBC
HCT: 40.7 % (ref 39.0–52.0)
Hemoglobin: 13.5 g/dL (ref 13.0–17.0)
MCH: 27.1 pg (ref 26.0–34.0)
MCHC: 33.2 g/dL (ref 30.0–36.0)
MCV: 81.6 fL (ref 78.0–100.0)
Platelets: 256 10*3/uL (ref 150–400)
RBC: 4.99 MIL/uL (ref 4.22–5.81)
RDW: 14.6 % (ref 11.5–15.5)
WBC: 8.7 10*3/uL (ref 4.0–10.5)

## 2011-12-24 LAB — LIPID PANEL
Cholesterol: 229 mg/dL — ABNORMAL HIGH (ref 0–200)
HDL: 26 mg/dL — ABNORMAL LOW (ref >39)
LDL Cholesterol: UNDETERMINED mg/dL (ref 0–99)
Total CHOL/HDL Ratio: 8.8 RATIO
Triglycerides: 481 mg/dL — ABNORMAL HIGH (ref <150)
VLDL: UNDETERMINED mg/dL (ref 0–40)

## 2011-12-24 LAB — PROTIME-INR
INR: 1.02 (ref 0.00–1.49)
Prothrombin Time: 13.3 seconds (ref 11.6–15.2)

## 2011-12-24 MED ORDER — PANTOPRAZOLE SODIUM 40 MG PO TBEC
40.0000 mg | DELAYED_RELEASE_TABLET | Freq: Every day | ORAL | Status: DC
  Administered 2011-12-24: 40 mg via ORAL
  Filled 2011-12-24: qty 1

## 2011-12-24 MED ORDER — FLUTICASONE PROPIONATE 50 MCG/ACT NA SUSP
1.0000 | Freq: Every day | NASAL | Status: DC
  Administered 2011-12-24 – 2011-12-25 (×2): 1 via NASAL
  Filled 2011-12-24: qty 16

## 2011-12-24 MED ORDER — STROKE: EARLY STAGES OF RECOVERY BOOK
Freq: Once | Status: AC
  Administered 2011-12-24: 06:00:00
  Filled 2011-12-24: qty 1

## 2011-12-24 MED ORDER — STUDY - INVESTIGATIONAL MEDICATION
600.0000 mg | Freq: Once | Status: AC
  Administered 2011-12-24: 600 mg via ORAL
  Filled 2011-12-24: qty 1

## 2011-12-24 MED ORDER — SIMVASTATIN 10 MG PO TABS
10.0000 mg | ORAL_TABLET | Freq: Every day | ORAL | Status: DC
  Filled 2011-12-24: qty 1

## 2011-12-24 MED ORDER — ATORVASTATIN CALCIUM 80 MG PO TABS
80.0000 mg | ORAL_TABLET | Freq: Every day | ORAL | Status: DC
  Administered 2011-12-24: 80 mg via ORAL
  Filled 2011-12-24 (×2): qty 1

## 2011-12-24 MED ORDER — AMLODIPINE BESYLATE 5 MG PO TABS
5.0000 mg | ORAL_TABLET | Freq: Every day | ORAL | Status: DC
  Administered 2011-12-24 – 2011-12-25 (×2): 5 mg via ORAL
  Filled 2011-12-24 (×2): qty 1

## 2011-12-24 MED ORDER — ALBUTEROL SULFATE HFA 108 (90 BASE) MCG/ACT IN AERS: 2.0000 | INHALATION_SPRAY | Freq: Four times a day (QID) | RESPIRATORY_TRACT | Status: DC | PRN

## 2011-12-24 MED ORDER — LISINOPRIL 20 MG PO TABS
20.0000 mg | ORAL_TABLET | Freq: Every day | ORAL | Status: DC
  Administered 2011-12-25: 20 mg via ORAL
  Filled 2011-12-24: qty 1

## 2011-12-24 MED ORDER — ENOXAPARIN SODIUM 40 MG/0.4ML ~~LOC~~ SOLN
40.0000 mg | SUBCUTANEOUS | Status: DC
  Filled 2011-12-24: qty 0.4

## 2011-12-24 MED ORDER — METOPROLOL TARTRATE 1 MG/ML IV SOLN
INTRAVENOUS | Status: AC
  Filled 2011-12-24: qty 5

## 2011-12-24 MED ORDER — METOPROLOL TARTRATE 25 MG PO TABS
25.0000 mg | ORAL_TABLET | Freq: Two times a day (BID) | ORAL | Status: DC
  Administered 2011-12-24 – 2011-12-25 (×4): 25 mg via ORAL
  Filled 2011-12-24 (×5): qty 1

## 2011-12-24 MED ORDER — LISINOPRIL 10 MG PO TABS
10.0000 mg | ORAL_TABLET | Freq: Every day | ORAL | Status: DC
  Administered 2011-12-24: 10 mg via ORAL
  Filled 2011-12-24: qty 1

## 2011-12-24 MED ORDER — LORATADINE 10 MG PO TABS
10.0000 mg | ORAL_TABLET | Freq: Every day | ORAL | Status: DC
  Administered 2011-12-24 – 2011-12-25 (×2): 10 mg via ORAL
  Filled 2011-12-24 (×2): qty 1

## 2011-12-24 MED ORDER — SODIUM CHLORIDE 0.9 % IV SOLN
INTRAVENOUS | Status: DC
  Administered 2011-12-24: 06:00:00 via INTRAVENOUS

## 2011-12-24 MED ORDER — ASPIRIN 325 MG PO TABS
325.0000 mg | ORAL_TABLET | Freq: Every day | ORAL | Status: DC
  Administered 2011-12-24 – 2011-12-25 (×2): 325 mg via ORAL
  Filled 2011-12-24 (×2): qty 1

## 2011-12-24 MED ORDER — STUDY - INVESTIGATIONAL MEDICATION
75.0000 mg | Freq: Every day | Status: DC
  Administered 2011-12-25: 75 mg via ORAL
  Filled 2011-12-24: qty 1

## 2011-12-24 MED ORDER — HYDRALAZINE HCL 25 MG PO TABS
25.0000 mg | ORAL_TABLET | Freq: Two times a day (BID) | ORAL | Status: DC
  Administered 2011-12-24 (×2): 25 mg via ORAL
  Filled 2011-12-24 (×3): qty 1

## 2011-12-24 MED ORDER — FENOFIBRATE 54 MG PO TABS
54.0000 mg | ORAL_TABLET | Freq: Every day | ORAL | Status: DC
  Administered 2011-12-25: 54 mg via ORAL
  Filled 2011-12-24: qty 1

## 2011-12-24 MED ORDER — ASPIRIN 81 MG PO TABS: 81.0000 mg | ORAL_TABLET | Freq: Every day | ORAL | Status: DC

## 2011-12-24 MED ORDER — INSULIN ASPART 100 UNIT/ML ~~LOC~~ SOLN: 0.0000 [IU] | Freq: Three times a day (TID) | SUBCUTANEOUS | Status: DC

## 2011-12-24 MED ORDER — ASPIRIN 81 MG PO CHEW: 324.0000 mg | CHEWABLE_TABLET | Freq: Once | ORAL | Status: AC

## 2011-12-24 NOTE — Care Management Note (Signed)
    Page 1 of 1   12/25/2011     10:34:52 AM   CARE MANAGEMENT NOTE 12/25/2011  Patient:  Alex [name], Alex [name]   Account Number:  [id number]  Date Initiated:  12/24/2011  Documentation initiated by:  GRAVES-BIGELOW,Sapphire Tygart  Subjective/Objective Assessment:   Pt admtted with TIA.     Action/Plan:   CM will provide pt with information on a PCP.   Anticipated DC Date:  12/26/2011   Anticipated DC Plan:  HOME/SELF CARE  In-house referral  Financial Counselor      DC Planning Services  CM consult      Choice offered to / List presented to:             Status of service:  Completed, signed off Medicare Important Message given?   (If response is "NO", the following Medicare IM given date fields will be blank) Date Medicare IM given:   Date Additional Medicare IM given:    Discharge Disposition:  HOME/SELF CARE  Per UR Regulation:  Reviewed for med. necessity/level of care/duration of stay  If discussed at Long Length of Stay Meetings, dates discussed:    Comments:  12-25-11 [address], [place] [phone] CM did speak to pt and CM provided pt with list of PCP's. Pt stated he was currently at [facility] and since they closed still has orange card but has not f/u with a MD. CM provided pt with inofrmation on [employer] and the [facility] that will be accepting new pts in November. Pt also stated that has medicaid has a $7000.00 deductible, so CM contacted the financial counselor and they will contact Social Services to make them aware that he was hospitalized. No further needs from CM at this time.

## 2011-12-24 NOTE — ED Notes (Signed)
Pt reports having chest pressure, SOB, and slurred speech this evening while watching TV.  Pt ambulatory on EMS arrival-also ambulatory without difficulty in ED, no distress noted.  CVA screen negative.  No slurred speech noted.  Pt denies radiation of CP, denies N/V/diaphoresis.  nonlabored breathing.  Pulse ox 95% on RA. Pt speaking in complete sentences without distress.

## 2011-12-24 NOTE — Progress Notes (Addendum)
[facility] PROGRESS NOTE  DARDEN FLEMISTER BMW:[id number] DOB: [birth year] DOA: 12/24/2011 PCP: Pcp Not In System  Assessment/Plan:  TIA, concern for CVA.  Patient has risk factors of diabetes, HTN, HLD, and CAD.  He states his symptoms coincided with an episode of chest pain, racing heart, palpitations, now resolved.  Troponins negative x 3.    -  Appreciate neurology assistance -  Await MRI/MRA -  Currently on study of asa versus asa + plavix -  Carotid duplex negative -  ECHO pending -  Telemetry shows sinus tachycardia.  Continue telemetry for 24 hours and consider outpatient telemetry.  Patient is seen by [facility] cardiology and may have this arranged by them.   -  Continue neuro checks -  Risk factor modification   HTN/HLD:  Blood pressure appears generally well controlled currently.  Patient had sinus tachycardia on telemetry however TSH was normal.  May have been due to side effect of hydralazine.  Lipid panel not at goal -  Permissive hypertension x 24 hours -  Continue metoprolol 25mg  po BID to avoid rebound tachycardia, particularly as patient currently has sinus tachycardia after receiving a dose this morning -  DC hydralazine and do not plan to restart secondary to persistent palpitations side effect that he experiences after he takes this medication.  Reconsider if he develops cardiomyopathy. -  Restart lisinopril and norvasc after 24 hours, and increase lisinopril dose due to stopping hydralazine -  Atorvastatin 80mg  and add fibrate.   -  Hold lasix  Lab Results  Component Value Date   CHOL 229* 12/24/2011   HDL 26* 12/24/2011   LDLCALC UNABLE TO CALCULATE IF TRIGLYCERIDE OVER 400 mg/dL 27/25/3664   TRIG 403* 12/24/2011   CHOLHDL 8.8 12/24/2011   T2DM:  Hemoglobin A1c 7.1. -  Hold metformin, but increase dose at discharge -  SSI -  Counseled weight loss, exercise, and healthy eating habits.  Lab Results  Component Value Date   HGBA1C 7.1* 12/24/2011   GERD,  with some sore throat  -  Continue protonix, but increase omeprazole at discharge  Allergies: May also be contributing to sore throat, but patient currently denies itchy eyes, nose, rhinorrhea, or sinus congestion -  Continue claritin and flonase  DIET:  Diabetic, healthy heart ACCESS:  PIV IVF:  OFF PROPH:  SCDs  Code Status: Full code Family Communication: spoke with patient and father who was at bedside Disposition Plan: Pending completion of 24 hours of telemetry and results of stroke work up  HPI: 56 year-old male with known history of diabetes mellitus type 2, hypertension, hyperlipidemia and nonobstructive CAD was had a normal stress test last month presents with complaints of difficulty speaking. Patient was on the phone talking to his friend at around 72 PM started developing difficulty finding words. The same time if found generalized weakness difficulty moving more on the left side. He called the EMS. The whole episode lasted around 3-5 minutes. By the time he was brought to the ER patient was nonfocal. His speech returned to normal. CT head was negative. Neurologist on-call was consulted and this time admitted for possible TIA. Patient in addition has been having chest pain off and on. Patient not able to correctly to characterize the chest pain. Presently chest pain-free. He says the chest pain last few seconds and as per the chest and has no relation to exertion. Denies any shortness of breath. Patient did not have any headache dizziness loss of consciousness.   Consultants:  Neurology  Procedures:  Carotid duplex 10/14  MRI/MRA brain pending  CT brain 10/14  ECHO pending  Antibiotics:  None   HPI/Subjective: Patient states that he feels back to baseline.  Denies focal numbness tingling weakness.  Face is no longer numb and father states face looks symmetric and voice is normal.    Objective: Filed Vitals:   12/24/11 0310 12/24/11 0700 12/24/11 0924 12/24/11  1100  BP: 140/100 148/93 150/98 139/90  Pulse: 101 89  69  Temp: 97.7 F (36.5 C) 97.5 F (36.4 C)  97.7 F (36.5 C)  TempSrc:    Oral  Resp: 18 17  18   Height: 5\' 7"  (1.702 m)     Weight: 143.11 kg (315 lb 8 oz)     SpO2: 97% 97%  96%    Intake/Output Summary (Last 24 hours) at 12/24/11 1452 Last data filed at 12/24/11 4098  Gross per 24 hour  Intake 293.33 ml  Output    275 ml  Net  18.33 ml   Filed Weights   12/24/11 0310  Weight: 143.11 kg (315 lb 8 oz)    Exam:   General:  Obese AAM, no acute distress, lying in bed  Cardiovascular: RRR, no mrg   Respiratory: CTAB  Abdomen: Obese, NABS, soft, nontender  MSK; Normal tone and bulk  Neuro:  III-XII intact except left lip does not curl up all the way (normal per father who is at bedside), no other signs of facial droop, strength 5/5 throughout, sensation intact to light touch, no dysmetria.    Data Reviewed: Basic Metabolic Panel:  Lab 12/24/11 1191 12/24/11 0057  NA 135 137  K 4.6 4.1  CL 100 99  CO2 22 26  GLUCOSE 130* 170*  BUN 18 18  CREATININE 1.31 1.50*  CALCIUM 9.9 9.8  MG -- --  PHOS -- --   Liver Function Tests:  Lab 12/24/11 0402 12/24/11 0057  AST 40* 38*  ALT 35 31  ALKPHOS 72 68  BILITOT 0.4 0.5  PROT 8.0 7.7  ALBUMIN 4.0 3.7   No results found for this basename: LIPASE:5,AMYLASE:5 in the last 168 hours No results found for this basename: AMMONIA:5 in the last 168 hours CBC:  Lab 12/24/11 0402 12/24/11 0057  WBC 9.5 8.7  NEUTROABS 4.3 --  HGB 13.9 13.5  HCT 41.2 40.7  MCV 81.6 81.6  PLT 307 256   Cardiac Enzymes:  Lab 12/24/11 0932 12/24/11 0402 12/24/11 0058  CKTOTAL -- -- --  CKMB -- -- --  CKMBINDEX -- -- --  TROPONINI <0.30 <0.30 <0.30   BNP (last 3 results) No results found for this basename: PROBNP:3 in the last 8760 hours CBG:  Lab 12/24/11 1133 12/24/11 0712 12/24/11 0356  GLUCAP 121* 133* 137*    No results found for this or any previous visit (from  the past 240 hour(s)).   Studies: Dg Chest 2 View  12/24/2011  *RADIOLOGY REPORT*  Clinical Data: Chest pressure.  Weakness.  Difficulty speaking.  CHEST - 2 VIEW  Comparison: 08/29/2010  Findings: Normal heart size and pulmonary vascularity.  No focal airspace consolidation in the lungs.  No blunting of costophrenic angles.  No pneumothorax.  Mediastinal contours appear intact. Tortuous aorta.  No significant change since previous study.  IMPRESSION: No evidence of active pulmonary disease.   Original Report Authenticated By: Marlon Pel, M.D.    Ct Head Wo Contrast  12/24/2011  *RADIOLOGY REPORT*  Clinical Data: Aphasia.  Left-sided weakness.  CT HEAD WITHOUT CONTRAST  Technique:  Contiguous axial images were obtained from the base of the skull through the vertex without contrast.  Comparison: 10/29/2006.  Findings: Bone windows demonstrate clear paranasal sinuses and mastoid air cells.  Soft tissue windows demonstrate age advanced vertebral atherosclerosis. No  mass lesion, hemorrhage, hydrocephalus, acute infarct, intra-axial, or extra-axial fluid collection.  IMPRESSION: No acute intracranial abnormality.  Age advanced vertebral atherosclerosis.   Original Report Authenticated By: Consuello Bossier, M.D.     Scheduled Meds:   .  stroke: mapping our early stages of recovery book   Does not apply Once  . amLODipine  5 mg Oral Daily  . aspirin  324 mg Oral Once  . aspirin  325 mg Oral Daily  . fluticasone  1 spray Each Nare Daily  . insulin aspart  0-9 Units Subcutaneous TID WC  . lisinopril  20 mg Oral Daily  . loratadine  10 mg Oral Daily  . metoprolol tartrate  25 mg Oral BID  . pantoprazole  40 mg Oral Q1200  . simvastatin  10 mg Oral q1800  . STUDY MEDICATION 600 mg  600 mg Oral Once  . STUDY MEDICATION 75 mg  75 mg Oral Daily  . DISCONTD: aspirin  81 mg Oral Daily  . DISCONTD: enoxaparin  40 mg Subcutaneous Q24H  . DISCONTD: hydrALAZINE  25 mg Oral BID  . DISCONTD:  lisinopril  10 mg Oral Daily   Continuous Infusions:   . sodium chloride 50 mL/hr at 12/24/11 0556    Principal Problem:  *Expressive aphasia Active Problems:  DIABETES MELLITUS  HYPERCHOLESTEROLEMIA  Transient ischemic attack (TIA)  Chest pain   Time spent: 45   Berdine Rasmusson, [facility]  [facility] Pager [phone]. If 8PM-8AM, please contact night-coverage at [email or web address], password [facility] 12/24/2011, 2:52 PM  LOS: 0 days

## 2011-12-24 NOTE — Progress Notes (Signed)
MRI canceled d/t unable to fit in scanner. Dr. Malachi Bonds notified. Emelda Brothers RN

## 2011-12-24 NOTE — Progress Notes (Signed)
UR Completed Cerys Winget Graves-Bigelow, RN,BSN [phone]  

## 2011-12-24 NOTE — H&P (Signed)
Alex [name] is an 56 y.o. male.   Patient was seen and examined on December 24, 2011. PCP - [facility]. Chief Complaint: Difficulty speaking. HPI: 56 year-old male with known history of diabetes mellitus type 2, hypertension, hyperlipidemia and nonobstructive CAD was had a normal stress test last month presents with complaints of difficulty speaking. Patient was on the phone talking to his friend at around 54 PM started developing difficulty finding words. The same time if found generalized weakness difficulty moving more on the left side. He called the EMS. The whole episode lasted around 3-5 minutes. By the time he was brought to the ER patient was nonfocal. His speech returned to normal. CT head was negative. Neurologist on-call was consulted and this time admitted for possible TIA. Patient in addition has been having chest pain off and on. Patient not able to correctly to characterize the chest pain. Presently chest pain-free. He says the chest pain last few seconds and as per the chest and has no relation to exertion. Denies any shortness of breath. Patient did not have any headache dizziness loss of consciousness.  Past Medical History  Diagnosis Date  . HTN (hypertension)   . Diabetes mellitus   . GERD (gastroesophageal reflux disease)   . CAD (coronary artery disease)     Cardiac cath 2002 with 25% distal RCA stenosis.   . Hyperlipidemia   . Asthma   . MI (myocardial infarction) 2009    no stent placement    Past Surgical History  Procedure Date  . Vasectomy   . Nasal sinus surgery     Family History  Problem Relation Age of Onset  . Heart attack Mother    Social History:  reports that he quit smoking about 20 years ago. His smoking use included Cigarettes. He has a 10 pack-year smoking history. He does not have any smokeless tobacco history on file. He reports that he does not drink alcohol or use illicit drugs.  Allergies:  Allergies  Allergen Reactions  . Codeine Nausea  Only    Medications Prior to Admission  Medication Sig Dispense Refill  . albuterol (PROVENTIL HFA;VENTOLIN HFA) 108 (90 BASE) MCG/ACT inhaler Inhale 2 puffs into the lungs every 6 (six) hours as needed.      Marland Kitchen amLODipine (NORVASC) 10 MG tablet Take 5 mg by mouth daily. Pt takes half he says it causes fluttering in his heart      . aspirin 81 MG tablet Take 81 mg by mouth daily.      . cetirizine (ZYRTEC) 10 MG tablet Take 10 mg by mouth daily as needed.       . furosemide (LASIX) 40 MG tablet Take 1 tablet (40 mg total) by mouth daily.  30 tablet  6  . hydrALAZINE (APRESOLINE) 25 MG tablet Take 25 mg by mouth 2 (two) times daily.      Marland Kitchen lisinopril (PRINIVIL,ZESTRIL) 20 MG tablet Take 10 mg by mouth daily.       . metFORMIN (GLUCOPHAGE) 500 MG tablet Take 500 mg by mouth 2 (two) times daily with a meal.      . metoprolol tartrate (LOPRESSOR) 25 MG tablet Take 25 mg by mouth 2 (two) times daily.      . mometasone (NASONEX) 50 MCG/ACT nasal spray Place 2 sprays into the nose daily.      . naproxen (NAPROSYN) 500 MG tablet Take 500 mg by mouth 2 (two) times daily with a meal.      .  omeprazole (PRILOSEC) 20 MG capsule Take 20 mg by mouth daily.       . potassium chloride SA (K-DUR,KLOR-CON) 20 MEQ tablet Take 1 tablet (20 mEq total) by mouth daily.  30 tablet  11  . pravastatin (PRAVACHOL) 20 MG tablet Take 20 mg by mouth daily.        Results for orders placed during the hospital encounter of 12/24/11 (from the past 48 hour(s))  CBC     Status: Normal   Collection Time   12/24/11 12:57 AM      Component Value Range Comment   WBC 8.7  4.0 - 10.5 K/uL    RBC 4.99  4.22 - 5.81 MIL/uL    Hemoglobin 13.5  13.0 - 17.0 g/dL    HCT 16.1  09.6 - 04.5 %    MCV 81.6  78.0 - 100.0 fL    MCH 27.1  26.0 - 34.0 pg    MCHC 33.2  30.0 - 36.0 g/dL    RDW 40.9  81.1 - 91.4 %    Platelets 256  150 - 400 K/uL   COMPREHENSIVE METABOLIC PANEL     Status: Abnormal   Collection Time   12/24/11 12:57 AM       Component Value Range Comment   Sodium 137  135 - 145 mEq/L    Potassium 4.1  3.5 - 5.1 mEq/L    Chloride 99  96 - 112 mEq/L    CO2 26  19 - 32 mEq/L    Glucose, Bld 170 (*) 70 - 99 mg/dL    BUN 18  6 - 23 mg/dL    Creatinine, Ser 7.82 (*) 0.50 - 1.35 mg/dL    Calcium 9.8  8.4 - 95.6 mg/dL    Total Protein 7.7  6.0 - 8.3 g/dL    Albumin 3.7  3.5 - 5.2 g/dL    AST 38 (*) 0 - 37 U/L    ALT 31  0 - 53 U/L    Alkaline Phosphatase 68  39 - 117 U/L    Total Bilirubin 0.5  0.3 - 1.2 mg/dL    GFR calc non Af Amer 50 (*) >90 mL/min    GFR calc Af Amer 58 (*) >90 mL/min   PROTIME-INR     Status: Normal   Collection Time   12/24/11 12:57 AM      Component Value Range Comment   Prothrombin Time 13.3  11.6 - 15.2 seconds    INR 1.02  0.00 - 1.49   TROPONIN I     Status: Normal   Collection Time   12/24/11 12:58 AM      Component Value Range Comment   Troponin I <0.30  <0.30 ng/mL    Dg Chest 2 View  12/24/2011  *RADIOLOGY REPORT*  Clinical Data: Chest pressure.  Weakness.  Difficulty speaking.  CHEST - 2 VIEW  Comparison: 08/29/2010  Findings: Normal heart size and pulmonary vascularity.  No focal airspace consolidation in the lungs.  No blunting of costophrenic angles.  No pneumothorax.  Mediastinal contours appear intact. Tortuous aorta.  No significant change since previous study.  IMPRESSION: No evidence of active pulmonary disease.   Original Report Authenticated By: Marlon Pel, M.D.    Ct Head Wo Contrast  12/24/2011  *RADIOLOGY REPORT*  Clinical Data: Aphasia.  Left-sided weakness.  CT HEAD WITHOUT CONTRAST  Technique:  Contiguous axial images were obtained from the base of the skull through the vertex without contrast.  Comparison:  10/29/2006.  Findings: Bone windows demonstrate clear paranasal sinuses and mastoid air cells.  Soft tissue windows demonstrate age advanced vertebral atherosclerosis. No  mass lesion, hemorrhage, hydrocephalus, acute infarct, intra-axial, or  extra-axial fluid collection.  IMPRESSION: No acute intracranial abnormality.  Age advanced vertebral atherosclerosis.   Original Report Authenticated By: Consuello Bossier, M.D.     Review of Systems  Constitutional: Negative.   HENT: Negative.   Eyes: Negative.   Respiratory: Negative.   Cardiovascular: Negative.   Gastrointestinal: Negative.   Genitourinary: Negative.   Musculoskeletal: Negative.   Skin: Negative.   Neurological: Positive for speech change.  Endo/Heme/Allergies: Negative.   Psychiatric/Behavioral: Negative.     Blood pressure 114/85, pulse 97, temperature 98.1 F (36.7 C), temperature source Oral, resp. rate 19, SpO2 96.00%. Physical Exam  Constitutional: He is oriented to person, place, and time. He appears well-developed and well-nourished. No distress.  HENT:  Head: Normocephalic and atraumatic.  Right Ear: External ear normal.  Left Ear: External ear normal.  Nose: Nose normal.  Mouth/Throat: Oropharynx is clear and moist. No oropharyngeal exudate.  Eyes: Conjunctivae normal are normal. Pupils are equal, round, and reactive to light. Right eye exhibits no discharge. Left eye exhibits no discharge. No scleral icterus.  Neck: Normal range of motion. Neck supple.  Cardiovascular: Normal rate and regular rhythm.   Respiratory: Effort normal and breath sounds normal. No respiratory distress. He has no wheezes. He has no rales.  GI: Soft. Bowel sounds are normal. He exhibits no distension. There is no tenderness. There is no rebound and no guarding.  Musculoskeletal: Normal range of motion. He exhibits no edema and no tenderness.  Neurological: He is alert and oriented to person, place, and time.       Moves all extremities 5/5. No facial asymmetry. Tongue is midline.  Skin: Skin is warm and dry. He is not diaphoretic.  Psychiatric: His behavior is normal.     Assessment/Plan #1. Brief episode of expressive aphasia and generalized weakness concerning for TIA  - patient will be placed on neurochecks, swallow evaluation, MRI/MRA brain, carotid Doppler and 2-D echo. EKG shows sinus rhythm. Aspirin. #2. Atypical chest pain with normal stress test done last month - cycle cardiac markers. #3. Hypertension - continue home medications. We'll hold off diuretics for now. #4. Diabetes mellitus type 2 - hold metformin for now. Sliding-scale coverage. #5. Hyperlipidemia - check lipid panel. Continue statins.  CODE STATUS - full code.  Jordayn Mink N. 12/24/2011, 3:25 AM

## 2011-12-24 NOTE — Progress Notes (Signed)
Patient was approached regarding the POINT trial by Dr. Pearlean Brownie and study coordinator Talbert Forest Hammonds. Informed Consent was given to the patient. Patient reviewed the consents and all questions and concerns was addressed. ICF was obtained at 0920. No study procedures was performed prior to patient signing the ICF. Patient met all inclusion and exclusion criteria and  was randomized into the POINT trial at 0948. First dose of study drug 600mg  was administered to patient at 1014. Copy of the ICF was given to the patient along with our office contact information.

## 2011-12-24 NOTE — Progress Notes (Signed)
Dr. Pearlean Brownie called to D/C Lovenox. Pt being place on study drug. Emelda Brothers RN

## 2011-12-24 NOTE — Progress Notes (Signed)
VASCULAR LAB PRELIMINARY  PRELIMINARY  PRELIMINARY  PRELIMINARY  Carotid duplex  completed.    Preliminary report:  Bilateral:  No evidence of hemodynamically significant internal carotid artery stenosis.   Vertebral artery flow is antegrade.      Ilario Dhaliwal, RVT 12/24/2011, 11:33 AM

## 2011-12-24 NOTE — ED Notes (Signed)
Pt started to have CP while watching TV.  Non-tender on palpation, no radiation.  Denies N/V/diaphoresis.  Pt ambulatory.  SOB, non-labored breathing.

## 2011-12-24 NOTE — Research (Signed)
Patient presented with 2 brief episodes of expressive aphasia and language disutrbance likely left brain TIAs onset 1130 pm last night. I spoke to him and discussed possible participation in POINT study. He expressed interest. I discussed risk benefit and answered questions about the study. He will be screened and if he qualifies and signs consent form will be randomized to aspirin plus plavix versus aspirin plus placebo. NIH stroke scale is zero at present.

## 2011-12-24 NOTE — ED Provider Notes (Signed)
History     CSN: [id number]  Arrival date & time 12/24/11  0014   First MD Initiated Contact with Patient 12/24/11 0025      Chief Complaint  Patient presents with  . Chest Pain    (Consider location/radiation/quality/duration/timing/severity/associated sxs/prior treatment) HPI Comments: Mr. Retterer presents via EMS for evaluation of chest discomfort. He states he is watched the [place] game this evening and at about 11:30 PM he had sudden onset difficulty speaking. He was on the phone with a friend and his friend noticed that his words were slurred and that he could not complete sentences. Simultaneously Mr. Nakanishi states he was unable to easily stand up because it felt as if his left side were weak. He was also having experiencing chest pain. Shortly after the onset of symptoms he called for EMS. The difficulty speaking or slurred speech resolved, chest pain has since resolved, but he still reports weakness in his left leg. He states he's had a cramping sensation all over his body throughout the day. He was not concerned as he has issues with chronic abdominal cramping and is also experienced leg cramping in the past.  He reports a previous history of a mild heart attack, type 2 diabetes, hypertension, and is currently on disability secondary to these multiple medical complaints. He has a positive family history of similar issues. He is known to [facility] cardiology service. He does not currently have a primary physician (he was a [facility] pt).  Patient is a 56 y.o. male presenting with chest pain. The history is provided by the patient. No language interpreter was used.  Chest Pain The chest pain began 1 - 2 hours ago. Duration of episode(s) is 60 minutes. Chest pain occurs constantly. The chest pain is resolved. At its most intense, the pain is at 3/10. The quality of the pain is described as dull and pressure-like. The pain does not radiate. Primary symptoms include fatigue and shortness  of breath. Pertinent negatives for primary symptoms include no fever, no syncope, no cough, no wheezing, no palpitations, no abdominal pain, no nausea, no vomiting, no dizziness and no altered mental status.  Associated symptoms include numbness and weakness.  Pertinent negatives for associated symptoms include no claudication, no diaphoresis, no lower extremity edema, no near-syncope, no orthopnea and no paroxysmal nocturnal dyspnea. He tried nothing for the symptoms. Risk factors include male gender, lack of exercise and obesity.  His past medical history is significant for CAD, diabetes, hyperlipidemia and hypertension.  His family medical history is significant for CAD in family, diabetes in family, heart disease in family and hypertension in family.     Past Medical History  Diagnosis Date  . HTN (hypertension)   . Diabetes mellitus   . GERD (gastroesophageal reflux disease)   . CAD (coronary artery disease)     Cardiac cath 2002 with 25% distal RCA stenosis.   . Hyperlipidemia   . Asthma   . MI (myocardial infarction) 2009    no stent placement    Past Surgical History  Procedure Date  . Vasectomy   . Nasal sinus surgery     Family History  Problem Relation Age of Onset  . Heart attack Mother     History  Substance Use Topics  . Smoking status: Former Smoker -- 1.0 packs/day for 10 years    Types: Cigarettes    Quit date: 11/06/1991  . Smokeless tobacco: Not on file   Comment: quit 1993  . Alcohol Use: No  Review of Systems  Constitutional: Positive for fatigue. Negative for fever and diaphoresis.  Respiratory: Positive for shortness of breath. Negative for cough and wheezing.   Cardiovascular: Positive for chest pain. Negative for palpitations, orthopnea, claudication, syncope and near-syncope.  Gastrointestinal: Negative for nausea, vomiting and abdominal pain.  Neurological: Positive for weakness and numbness. Negative for dizziness.    Psychiatric/Behavioral: Negative for altered mental status.  All other systems reviewed and are negative.    Allergies  Codeine  Home Medications   Current Outpatient Rx  Name Route Sig Dispense Refill  . ALBUTEROL SULFATE HFA 108 (90 BASE) MCG/ACT IN AERS Inhalation Inhale 2 puffs into the lungs every 6 (six) hours as needed.    Marland Kitchen AMLODIPINE BESYLATE 10 MG PO TABS Oral Take 5 mg by mouth daily. Pt takes half he says it causes fluttering in his heart    . ASPIRIN 81 MG PO TABS Oral Take 81 mg by mouth daily.    Marland Kitchen CETIRIZINE HCL 10 MG PO TABS Oral Take 10 mg by mouth daily.    . FUROSEMIDE 40 MG PO TABS Oral Take 1 tablet (40 mg total) by mouth daily. 30 tablet 6  . HYDRALAZINE HCL 25 MG PO TABS Oral Take 25 mg by mouth 2 (two) times daily.    Marland Kitchen LISINOPRIL 20 MG PO TABS Oral Take 20 mg by mouth daily.     Marland Kitchen METFORMIN HCL 500 MG PO TABS Oral Take 500 mg by mouth 2 (two) times daily with a meal.    . METOPROLOL TARTRATE 25 MG PO TABS Oral Take 25 mg by mouth 2 (two) times daily.    . MOMETASONE FUROATE 50 MCG/ACT NA SUSP Nasal Place 2 sprays into the nose daily.    Marland Kitchen NAPROXEN 500 MG PO TABS Oral Take 500 mg by mouth 2 (two) times daily with a meal.    . OMEPRAZOLE 20 MG PO CPDR Oral Take 20 mg by mouth daily. Two tabs daily    . POTASSIUM CHLORIDE CRYS ER 20 MEQ PO TBCR Oral Take 1 tablet (20 mEq total) by mouth daily. 30 tablet 11  . PRAVASTATIN SODIUM 20 MG PO TABS Oral Take 20 mg by mouth daily.      BP 143/91  Pulse 108  Temp 98.2 F (36.8 C) (Oral)  Resp 18  SpO2 95%  Physical Exam  Nursing note and vitals reviewed. Constitutional: He is oriented to person, place, and time. He appears well-developed and well-nourished. No distress.  HENT:  Head: Normocephalic and atraumatic.  Right Ear: External ear normal.  Left Ear: External ear normal.  Nose: Nose normal.  Mouth/Throat: Oropharynx is clear and moist. No oropharyngeal exudate.  Eyes: Conjunctivae normal and EOM are  normal. Pupils are equal, round, and reactive to light. Right eye exhibits no discharge. Left eye exhibits no discharge. No scleral icterus.  Neck: Normal range of motion. Neck supple. No JVD present. No tracheal deviation present.  Cardiovascular: Regular rhythm, S1 normal, S2 normal, normal heart sounds and intact distal pulses.   No extrasystoles are present. Tachycardia present.  PMI is not displaced.  Exam reveals no gallop, no distant heart sounds and no friction rub.   No murmur heard. Pulmonary/Chest: Effort normal and breath sounds normal. No stridor. No respiratory distress. He has no wheezes. He has no rales. He exhibits no tenderness.  Abdominal: Soft. Bowel sounds are normal. He exhibits no distension. There is no tenderness. There is no rebound and no guarding.  Musculoskeletal:  Normal range of motion. He exhibits no edema and no tenderness.  Lymphadenopathy:    He has no cervical adenopathy.  Neurological: He is alert and oriented to person, place, and time. He displays no atrophy and no tremor. No cranial nerve deficit or sensory deficit. He displays no seizure activity. Coordination normal. GCS eye subscore is 4. GCS verbal subscore is 5. GCS motor subscore is 6. He displays no Babinski's sign on the right side. He displays no Babinski's sign on the left side.       Note inability to raise left leg against gravity.  He dose have intact and symmetric strength with dorsi and plantar flexion.  Skin: Skin is warm, dry and intact. No rash noted. He is not diaphoretic. No cyanosis or erythema. No pallor. Nails show no clubbing.  Psychiatric: He has a normal mood and affect. His behavior is normal.    ED Course  Procedures (including critical care time)   Labs Reviewed  CBC  COMPREHENSIVE METABOLIC PANEL  PROTIME-INR  TROPONIN I   No results found.   No diagnosis found.   Date: 12/24/2011  Rate: 116 bpm  Rhythm: sinus tachycardia  QRS Axis: normal  Intervals: normal  ST/T  Wave abnormalities: normal  Conduction Disutrbances:none  Narrative Interpretation:   Old EKG Reviewed: unchanged      MDM  Pt presents for evaluation of acute onset chest discomfort and aphasia associated with left-sided weakness.  The aphasia and chest pressure has resolved but he reports weakness in his left leg.  Note elevated HR, NAD.  ECG is not consistent with a STEMI.  Plan basic labs, coags, CXR, CT head, trop.  Will consult neurology and seek admission for further evaluation as results become available.  He has multiple risk factors including obesity, htn, known CAD, DM, and a positive family hx of heart disease.  0105.  Pt is reported to have ambulated with a steady gait on arrival to the ER.  0210.  Pt stable, NAD.  Pt has been evaluated by Dr. Orlene Och (neurohospitalist).  He agrees that secondary to rapidly resolving symptoms and no deficit noted on exam, he is not a candidate for tPA.  Discussed his hx and evaluation with the on-call hospitalist (covering for unassigned patients).  He will be admitted for further evaluation and treatment.        Tobin Chad, MD 12/24/11 [phone]

## 2011-12-24 NOTE — ED Notes (Signed)
CBG 140, 324 asa, 2SL nitro-pain 2/10

## 2011-12-24 NOTE — Consult Note (Signed)
Reason for Consult:TIA Referring Physician: Dr Lorenso Courier (ER)  Alex [name] is an 56 y.o. male.  HPI: 56 yo male with history of DM, HTN, HLD, mild non-obstructive CAD by cath 2002, asthma, GERD who is seen for probable TIA.  Notes that about 1 hour prior to arrival he was at home watching TV, talking on the phone when he suddenly had trouble with slurred speech and trouble forming words, and that he was unable to get up due to diffuse weakness.  By time of EMS arrival had normal speech and was able to walk to stretcher, but there was some ? Left leg weakness.  Due to rapidly improved symptoms, he was not felt to be a tPA candidate.  Head CT was negative.  He notes diziness for the past week and some chest pressure on admission.  Denies any diplopia, vertigo, numbness, or prior similar episode.  He was seen as a new patient 11/06/11 in [facility] clinici and had been seen by Dr. Jens Som in December 2009, and then Dr Clifton James on 12/06/11. Has had past complaints of chest pain (negative workups). Noted cardiac cath in 2002 which showed 25% stenosis in the distal RCA. Last Echo was 11/08/11 which showed normal LV size and function with LVEF of 55-65% and moderate LVH. There were no significant valvular issues. Lexiscan myoview on 11/15/11 with no inducible ischemia.   Past Medical History  Diagnosis Date  . HTN (hypertension)   . Diabetes mellitus   . GERD (gastroesophageal reflux disease)   . CAD (coronary artery disease)     Cardiac cath 2002 with 25% distal RCA stenosis.   . Hyperlipidemia   . Asthma   . MI (myocardial infarction) 2009    no stent placement    Past Surgical History  Procedure Date  . Vasectomy   . Nasal sinus surgery     Family History  Problem Relation Age of Onset  . Heart attack Mother     Social History:  reports that he quit smoking about 20 years ago. His smoking use included Cigarettes. He has a 10 pack-year smoking history. He does not have any smokeless  tobacco history on file. He reports that he does not drink alcohol or use illicit drugs.  Allergies:  Allergies  Allergen Reactions  . Codeine     Medications: I have reviewed the patient's current medications.  Results for orders placed during the hospital encounter of 12/24/11 (from the past 48 hour(s))  CBC     Status: Normal   Collection Time   12/24/11 12:57 AM      Component Value Range Comment   WBC 8.7  4.0 - 10.5 K/uL    RBC 4.99  4.22 - 5.81 MIL/uL    Hemoglobin 13.5  13.0 - 17.0 g/dL    HCT 16.1  09.6 - 04.5 %    MCV 81.6  78.0 - 100.0 fL    MCH 27.1  26.0 - 34.0 pg    MCHC 33.2  30.0 - 36.0 g/dL    RDW 40.9  81.1 - 91.4 %    Platelets 256  150 - 400 K/uL     Dg Chest 2 View  12/24/2011  *RADIOLOGY REPORT*  Clinical Data: Chest pressure.  Weakness.  Difficulty speaking.  CHEST - 2 VIEW  Comparison: 08/29/2010  Findings: Normal heart size and pulmonary vascularity.  No focal airspace consolidation in the lungs.  No blunting of costophrenic angles.  No pneumothorax.  Mediastinal contours appear  intact. Tortuous aorta.  No significant change since previous study.  IMPRESSION: No evidence of active pulmonary disease.   Original Report Authenticated By: Marlon Pel, M.D.    Ct Head Wo Contrast  12/24/2011  *RADIOLOGY REPORT*  Clinical Data: Aphasia.  Left-sided weakness.  CT HEAD WITHOUT CONTRAST  Technique:  Contiguous axial images were obtained from the base of the skull through the vertex without contrast.  Comparison: 10/29/2006.  Findings: Bone windows demonstrate clear paranasal sinuses and mastoid air cells.  Soft tissue windows demonstrate age advanced vertebral atherosclerosis. No  mass lesion, hemorrhage, hydrocephalus, acute infarct, intra-axial, or extra-axial fluid collection.  IMPRESSION: No acute intracranial abnormality.  Age advanced vertebral atherosclerosis.   Original Report Authenticated By: Consuello Bossier, M.D.     Review of Systems: A  comprehensive review of systems was negative except for: Neurological: positive for dizziness and speech problems  Blood pressure 143/91, pulse 108, temperature 98.2 F (36.8 C), temperature source Oral, resp. rate 18, SpO2 95.00%.  Neurological Exam:  Patient is alert and oriented x 3. Obese.  No acute distress.  Fluent speech.  Able to name, repeat, and follow commands.  EOMI, VFFC, PERRL.  Face symmetric with normal sensation.  Tongue protrudes midline, otherwise CN's II-XII intact. Motor: Normal bulk and tone with 5/5 strength throughout.  Sensation:  Normal pinprick, vibration, and proprioception.  Cerebellar:  Normal finger to nose  No pronator drift.  DTR's 2+ throughout with toes downgoing  CV:  RRR  Lungs: CTAB  Abd: obese, NT   Skin: no cuts, abrasions, bruises  Assessment/Plan:  1) Possible TIA (? Left hemispheric)  Discussion:  56 YO BM with multiple vascular risk factors who presents with reported transient speech difficulties and diffuse weakness.  Essentially normal neuro exam by time of ED arrival.  Overall, will need to exclude cardiac basis for presentation given risk factors  Recc: 1) stroke workup:  Head MRI/MRA, carotids, Echo 2) vascular risk factor modification: will need outpt sleep study (obese, multiple risk factors, ? Witnessed apneas, TIA), check fasting lipid panel 3) permissive hypertension, Telemetry 4) ASA/ Statin 5) cardiac assessment (as planned) 6) PT, OT, ST evals.....doubt need for any long term therapy  Thank You,  Beryle Beams, M.D. Neurology Cell [phone]  Beryle Beams 12/24/2011, 1:34 AM

## 2011-12-25 DIAGNOSIS — I2510 Atherosclerotic heart disease of native coronary artery without angina pectoris: Secondary | ICD-10-CM

## 2011-12-25 DIAGNOSIS — R079 Chest pain, unspecified: Secondary | ICD-10-CM

## 2011-12-25 LAB — GLUCOSE, CAPILLARY
Glucose-Capillary: 115 mg/dL — ABNORMAL HIGH (ref 70–99)
Glucose-Capillary: 116 mg/dL — ABNORMAL HIGH (ref 70–99)

## 2011-12-25 MED ORDER — PRAVASTATIN SODIUM 40 MG PO TABS: 40.0000 mg | ORAL_TABLET | Freq: Every day | ORAL | Status: DC

## 2011-12-25 MED ORDER — ATORVASTATIN CALCIUM 40 MG PO TABS
40.0000 mg | ORAL_TABLET | Freq: Every day | ORAL | Status: DC
  Filled 2011-12-25: qty 1

## 2011-12-25 MED ORDER — CETIRIZINE HCL 10 MG PO TABS: 10.0000 mg | ORAL_TABLET | Freq: Every day | ORAL | Status: DC | PRN

## 2011-12-25 MED ORDER — OMEPRAZOLE 40 MG PO CPDR: 40.0000 mg | DELAYED_RELEASE_CAPSULE | Freq: Every day | ORAL | Status: DC

## 2011-12-25 MED ORDER — ASPIRIN 325 MG PO TABS: 325.0000 mg | ORAL_TABLET | Freq: Every day | ORAL | Status: DC

## 2011-12-25 MED ORDER — METOPROLOL TARTRATE 25 MG PO TABS: 25.0000 mg | ORAL_TABLET | Freq: Two times a day (BID) | ORAL | Status: DC

## 2011-12-25 MED ORDER — LISINOPRIL 20 MG PO TABS: 20.0000 mg | ORAL_TABLET | Freq: Every day | ORAL | Status: DC

## 2011-12-25 MED ORDER — METFORMIN HCL 1000 MG PO TABS: 500.0000 mg | ORAL_TABLET | Freq: Two times a day (BID) | ORAL | Status: DC

## 2011-12-25 MED ORDER — ALBUTEROL SULFATE HFA 108 (90 BASE) MCG/ACT IN AERS: 2.0000 | INHALATION_SPRAY | Freq: Four times a day (QID) | RESPIRATORY_TRACT | Status: DC | PRN

## 2011-12-25 MED ORDER — FENOFIBRATE 54 MG PO TABS: 54.0000 mg | ORAL_TABLET | Freq: Every day | ORAL | Status: DC

## 2011-12-25 NOTE — Progress Notes (Signed)
Stroke Team Progress Note  HISTORY 56 yo male with history of DM, HTN, HLD, mild non-obstructive CAD by cath 2002, asthma, GERD who is seen for probable TIA. Notes that about 1 hour prior to arrival he was at home watching TV, talking on the phone when he suddenly had trouble with slurred speech and trouble forming words, and that he was unable to get up due to diffuse weakness. By time of EMS arrival had normal speech and was able to walk to stretcher, but there was some ? Left leg weakness. Due to rapidly improved symptoms, he was not felt to be a tPA candidate. Head CT was negative. He notes diziness for the past week and some chest pressure on admission. Denies any diplopia, vertigo, numbness, or prior similar episode.   He was seen as a new patient 11/06/11 in [facility] clin[facility] and had been seen by Dr. Jens Som in December 2009, and then Dr Clifton James on 12/06/11. Has had past complaints of chest pain (negative workups). Noted cardiac cath in 2002 which showed 25% stenosis in the distal RCA. Last Echo was 11/08/11 which showed normal LV size and function with LVEF of 55-65% and moderate LVH. There were no significant valvular issues. Lexiscan myoview on 11/15/11 with no inducible ischemia.  SUBJECTIVE No family is at the bedside.  Overall he feels his condition is stable.   OBJECTIVE Most recent Vital Signs: Filed Vitals:   12/24/11 1637 12/24/11 2000 12/25/11 0000 12/25/11 0400  BP: 117/74 128/91 126/87 133/88  Pulse: 76 82 80 74  Temp: 97.5 F (36.4 C) 97.8 F (36.6 C) 97 F (36.1 C) 97.9 F (36.6 C)  TempSrc: Oral Oral Oral Oral  Resp: 18 20 20 18   Height:      Weight:      SpO2: 95% 96% 95% 94%   CBG (last 3)   Basename 12/25/11 0728 12/24/11 2053 12/24/11 1626  GLUCAP 115* 108* 132*    IV Fluid Intake:     . DISCONTD: sodium chloride 50 mL/hr at 12/24/11 0556    MEDICATIONS    . amLODipine  5 mg Oral Daily  . aspirin  325 mg Oral Daily  . atorvastatin  80 mg Oral  q1800  . fenofibrate  54 mg Oral Daily  . fluticasone  1 spray Each Nare Daily  . insulin aspart  0-9 Units Subcutaneous TID WC  . lisinopril  20 mg Oral Daily  . loratadine  10 mg Oral Daily  . metoprolol tartrate  25 mg Oral BID  . STUDY MEDICATION 600 mg  600 mg Oral Once  . STUDY MEDICATION 75 mg  75 mg Oral Daily  . DISCONTD: enoxaparin  40 mg Subcutaneous Q24H  . DISCONTD: hydrALAZINE  25 mg Oral BID  . DISCONTD: lisinopril  10 mg Oral Daily  . DISCONTD: pantoprazole  40 mg Oral Q1200  . DISCONTD: simvastatin  10 mg Oral q1800   PRN:  albuterol  Diet:  Carb Control thin liquids Activity:   Bathroom privileges with assistance DVT Prophylaxis:  SCDs   CLINICALLY SIGNIFICANT STUDIES Basic Metabolic Panel:  Lab 12/24/11 2130 12/24/11 0057  NA 135 137  K 4.6 4.1  CL 100 99  CO2 22 26  GLUCOSE 130* 170*  BUN 18 18  CREATININE 1.31 1.50*  CALCIUM 9.9 9.8  MG -- --  PHOS -- --   Liver Function Tests:  Lab 12/24/11 0402 12/24/11 0057  AST 40* 38*  ALT 35 31  ALKPHOS  72 68  BILITOT 0.4 0.5  PROT 8.0 7.7  ALBUMIN 4.0 3.7   CBC:  Lab 12/24/11 0402 12/24/11 0057  WBC 9.5 8.7  NEUTROABS 4.3 --  HGB 13.9 13.5  HCT 41.2 40.7  MCV 81.6 81.6  PLT 307 256   Coagulation:  Lab 12/24/11 0057  LABPROT 13.3  INR 1.02   Cardiac Enzymes:  Lab 12/24/11 1620 12/24/11 0932 12/24/11 0402  CKTOTAL -- -- --  CKMB -- -- --  CKMBINDEX -- -- --  TROPONINI <0.30 <0.30 <0.30   Urinalysis: No results found for this basename: COLORURINE:2,APPERANCEUR:2,LABSPEC:2,PHURINE:2,GLUCOSEU:2,HGBUR:2,BILIRUBINUR:2,KETONESUR:2,PROTEINUR:2,UROBILINOGEN:2,NITRITE:2,LEUKOCYTESUR:2 in the last 168 hours Lipid Panel    Component Value Date/Time   CHOL 229* 12/24/2011 0411   TRIG 481* 12/24/2011 0411   HDL 26* 12/24/2011 0411   CHOLHDL 8.8 12/24/2011 0411   VLDL UNABLE TO CALCULATE IF TRIGLYCERIDE OVER 400 mg/dL 96/06/5407 8119   LDLCALC UNABLE TO CALCULATE IF TRIGLYCERIDE OVER 400 mg/dL  14/78/2956 2130   QMVH8I  Lab Results  Component Value Date   HGBA1C 7.1* 12/24/2011    Urine Drug Screen:     Component Value Date/Time   LABOPIA NONE DETECTED 12/24/2011 0415   COCAINSCRNUR NONE DETECTED 12/24/2011 0415   LABBENZ NONE DETECTED 12/24/2011 0415   AMPHETMU NONE DETECTED 12/24/2011 0415   THCU NONE DETECTED 12/24/2011 0415   LABBARB NONE DETECTED 12/24/2011 0415    Alcohol Level: No results found for this basename: ETH:2 in the last 168 hours  CT of the brain  12/24/2011  No acute intracranial abnormality.  Age advanced vertebral atherosclerosis.    MRI of the brain  Too large  MRA of the brain  Too large  2D Echocardiogram  11/08/2011 EF 55-65% with no source of embolus. Moderate LVH. No SOE  Carotid Doppler  No evidence of hemodynamically significant internal carotid artery stenosis. Vertebral artery flow is antegrade.  CXR  12/24/2011   No evidence of active pulmonary disease.       EKG  sinus tachycardia.   Therapy Recommendations no needs  Physical Exam  obese young [place] American male currently not in distress .Awake alert. Afebrile. Head is nontraumatic. Neck is supple without bruit. Hearing is normal. Cardiac exam no murmur or gallop. Lungs are clear to auscultation. Distal pulses are well felt.  Neurological Exam : Awake  Alert oriented x 3. Normal speech and language.eye movements full without nystagmus.fundi not visualized. Vision acuity and fields appear normal. Face symmetric. Tongue midline. Normal strength, tone, reflexes and coordination. Normal sensation. Gait deferred.  ASSESSMENT Alex [name] is a 56 y.o. male presenting with expressive aphasia and diffuse weakness. Patient with left brain TIA. TIA felt to be thrombotic secondary to small vessel disease.  Work up completed. On aspirin 81 mg orally every day prior to admission. Now enrolled in POINT trial on aspirin 325 mg orally every day and Plavix/Placebo for secondary stroke  prevention.    Hypertension  Diabetes  GERD  CAD, MI Hyperlipidemia, LDL high (unable to calculate), on pravachol 20 PTA, now on lipitor 80 mg, goal LDL < 100 Patient  Enrolled in POINT .trial  Hospital day # 1  TREATMEN(T/PLAN  Continue aspirin 325 mg orally every day and POINT drug (Plavix vs Placebo) for secondary stroke prevention.  Decrease Lipitor to 40 mg daily  Cancel 2D with bubble. 2D completed in Aug without SOE  discharge home with POINT study medication from the pharmacy Stroke Service will sign off. Follow up with Dr. Pearlean Brownie, Stroke Clinic,  in 2 months.  Alex Main, MSN, RN, ANVP-BC, ANP-BC, [facility] Pager: [phone] 12/25/2011 8:26 AM  Scribe for Dr. Delia Heady, Stroke Center Medical Director, who has personally reviewed chart, pertinent data, examined the patient and developed the plan of care. Pager:  [phone]

## 2011-12-25 NOTE — Discharge Summary (Signed)
Physician Discharge Summary  Alex [name] EAV:[id number] DOB: 1955-08-05 DOA: 12/24/2011  PCP: Pcp Not In System  Admit date: 12/24/2011 Discharge date: 12/25/2011  Recommendations for Outpatient Follow-up:  1. Follow up with primary care doctor in 1 month for diabetes and blood pressure and cholesterol management. 2. Patient will be called by Neurology for appointment with Dr. Pearlean [name] in 2 months  3. He should continue his study drug for 3 months and continue to take a full dose aspirin 4. He is advised to eat a healthy diet, exercise regularly and lose weight.   5. He should follow up with Cardiology within 1 week to discuss his episode of chest pain and possibly arranging for outpatient telemetry.   6. Establish PCP for further management of diabetes, cholesterol, and blood pressure.    Discharge Diagnoses:  Principal Problem:  *Transient ischemic attack (TIA) Active Problems:  DIABETES MELLITUS  HYPERCHOLESTEROLEMIA  HYPERTENSION, BENIGN ESSENTIAL  Expressive aphasia  Chest pain   Discharge Condition: stable, improved  Diet recommendation: diabetic, healthy heart  Wt Readings from Last 3 Encounters:  12/24/11 143.11 kg (315 lb 8 oz)  12/06/11 145.151 kg (320 lb)  11/14/11 145.151 kg (320 lb)    History of present illness:   56 year-old male with known history of diabetes mellitus type 2, hypertension, hyperlipidemia and nonobstructive CAD was had a normal stress test last month presents with complaints of difficulty speaking. Patient was on the phone talking to his friend at around 52 PM started developing difficulty finding words. The same time if found generalized weakness difficulty moving more on the left side. He called the EMS. The whole episode lasted around 3-5 minutes. By the time he was brought to the ER patient was nonfocal. His speech returned to normal. CT head was negative. Neurologist on-call was consulted and this time admitted for possible TIA. Patient in  addition has been having chest pain off and on. Patient not able to correctly to characterize the chest pain. Presently chest pain-free. He says the chest pain last few seconds and as per the chest and has no relation to exertion. Denies any shortness of breath. Patient did not have any headache dizziness loss of consciousness.   Hospital Course:   TIA, concern for CVA.  Alex [name] has risk factors of diabetes, HTN, HLD, and CAD.  Neurology was consulted.  His head CT demonstrated vertebral atherosclerosis without acute intracranial abnormality.  He was unable to undergo MRI/MRA due to body habitus.  His carotid duplex was negative.  He had an ECHO on 10/2011 which demonstrated a preserved ejection fraction, grade 1 diastolic dysfunction, no valvular dysfunction, mildly elevated PA pressure of .  He was started on a full dose aspirin and enrolled in the POINT study.  He should continue full dose aspirin daily and take one study drug pill daily for 90s days.  He will follow up in the Neurology clinic in 2-3 months and will be called with the appointment time and date.  See below for other risk factor modification.    Chest pain/palpitations:  Per patient, his neurologic symptoms coincided with an episode of chest pain, racing heart, palpitations, now resolved.  His chest pain resolved on its own and did not recur, but he continued to have some palpitations on 10/14. Troponins were negative x 3 and his ECG demonstrated sinus tachycardia but his telemetry demonstrated sinus tachycardia on 10/14.  His hydralazine was discontinued and his heart rate trended down to the normal range.  His palpitations also resolved.  His TSH was normal.    HTN/HLD:  Blood pressure appears generally well controlled currently.  Lipid panel was not at goal, however.  He continued metoprolol 25mg  po BID and norvasc.  His lisinopril was increased slightly because his hydralazine was discontinued.  Although he was started on  atorvastatin 80mg  and fibrate, the patient does not have insurance and pays out of pocket for medications so he was converted to maximum dose pravastatin, which is on the $4 list  Lab Results   Component  Value  Date    CHOL  229*  12/24/2011    HDL  26*  12/24/2011    LDLCALC  UNABLE TO CALCULATE IF TRIGLYCERIDE OVER 400 mg/dL  40/98/1191    TRIG  478*  12/24/2011    CHOLHDL  8.8  12/24/2011    T2DM: Hemoglobin A1c 7.1.  His metformin was held in anticipation of inpatient imaging studies and he was given sliding scale insulin.  His fingersticks ranged in the low 100s.  He should resume a higher dose of metformin as outpatient.  He was counseled on weight loss, exercise, and healthy eating habits.  Lab Results   Component  Value  Date    HGBA1C  7.1*  12/24/2011    GERD, with some sore throat.  Increased omeprazole. Allergies:  May also be contributing to sore throat, but patient currently denies itchy eyes, nose, rhinorrhea, or sinus congestion.  He continued claritin and flonase.  Consultants:  Neurology Procedures:  Carotid duplex 10/14  CT brain 10/14  Antibiotics:  None  Discharge Exam: Filed Vitals:   12/25/11 0827  BP: 139/90  Pulse: 85  Temp: 97.8 F (36.6 C)  Resp: 20   Filed Vitals:   12/24/11 2000 12/25/11 0000 12/25/11 0400 12/25/11 0827  BP: 128/91 126/87 133/88 139/90  Pulse: 82 80 74 85  Temp: 97.8 F (36.6 C) 97 F (36.1 C) 97.9 F (36.6 C) 97.8 F (36.6 C)  TempSrc: Oral Oral Oral Oral  Resp: 20 20 18 20   Height:      Weight:      SpO2: 96% 95% 94% 96%   General: Obese AAM, no acute distress, sitting at edge of bed  Cardiovascular: RRR, no mrg  Respiratory: CTAB  Abdomen: Obese, NABS, soft, nontender  MSK; Normal tone and bulk  Neuro: III-XII intact except left lip does not curl up all the way, stable and lifelong, no other signs of facial droop, strength 5/5 throughout, sensation intact to light touch, no dysmetria.   Discharge  Instructions      Discharge Orders    Future Orders Please Complete By Expires   Diet - low sodium heart healthy      Increase activity slowly      Discharge instructions      Comments:   You were hospitalized with a transient ischemic attack or near stroke.  Your CT scan showed some atherosclerosis of the vertebral arteries of the brain but no other abnormalities.  Your neck arteries were not narrowed.  You could not have an MRI because you did not fit in the scanner at this time.  Your blood tests for heart attack were negative and your ECG did not show heart attack.  You were started on a full dose aspirin and a study medication.  Continue the aspirin indefinitely, but continue the study medication for 90 days.  You will follow up with Dr. Pearlean [name] to discuss this medication.  Your hydralazine  was discontinued because of the side effect of palpitations and fast heart rate.  Your lisinopril was increased slightly.  Your cholesterol was not controlled and you were started on fenofibrate for high triglycerides.  Your pravastatin was increased.  Your hemoglobin a1c was elevated slightly and you should increase your metformin.  Please exercise, lose at least 10 pounds, and eat a healthy diet.  Finally, you were dehydrated when you came to the hospital so your lasix and potassium were discontinued.  Please talk to your doctor about restarting them if you develop swelling of your legs.  Your omeprazole was increased to see if this will help your sore throat.   Call MD for:      Comments:   Call 911 if you have chest pain, shortness of breath, slurred speech, facial droop, numbness or weakness of an arm or leg.   Call MD for:  persistant nausea and vomiting      Call MD for:  temperature >100.4      Call MD for:  severe uncontrolled pain      Call MD for:  difficulty breathing, headache or visual disturbances      Call MD for:  hives      Call MD for:  persistant dizziness or light-headedness      Call  MD for:  extreme fatigue      (HEART FAILURE PATIENTS) Call MD:  Anytime you have any of the following symptoms: 1) 3 pound weight gain in 24 hours or 5 pounds in 1 week 2) shortness of breath, with or without a dry hacking cough 3) swelling in the hands, feet or stomach 4) if you have to sleep on extra pillows at night in order to breathe.          Medication List     As of 12/25/2011 11:40 AM    STOP taking these medications         furosemide 40 MG tablet   Commonly known as: LASIX      hydrALAZINE 25 MG tablet   Commonly known as: APRESOLINE      potassium chloride SA 20 MEQ tablet   Commonly known as: K-DUR,KLOR-CON      TAKE these medications         albuterol 108 (90 BASE) MCG/ACT inhaler   Commonly known as: PROVENTIL HFA;VENTOLIN HFA   Inhale 2 puffs into the lungs every 6 (six) hours as needed.      amLODipine 10 MG tablet   Commonly known as: NORVASC   Take 5 mg by mouth daily. Pt takes half he says it causes fluttering in his heart      aspirin 325 MG tablet   Take 1 tablet (325 mg total) by mouth daily.      cetirizine 10 MG tablet   Commonly known as: ZYRTEC   Take 1 tablet (10 mg total) by mouth daily as needed.      fenofibrate 54 MG tablet   Take 1 tablet (54 mg total) by mouth daily.      lisinopril 20 MG tablet   Commonly known as: PRINIVIL,ZESTRIL   Take 1 tablet (20 mg total) by mouth daily.      metFORMIN 1000 MG tablet   Commonly known as: GLUCOPHAGE   Take 0.5 tablets (500 mg total) by mouth 2 (two) times daily with a meal.      metoprolol tartrate 25 MG tablet   Commonly known as: LOPRESSOR   Take 1 tablet (  25 mg total) by mouth 2 (two) times daily.      mometasone 50 MCG/ACT nasal spray   Commonly known as: NASONEX   Place 2 sprays into the nose daily.      naproxen 500 MG tablet   Commonly known as: NAPROSYN   Take 500 mg by mouth 2 (two) times daily with a meal.      omeprazole 40 MG capsule   Commonly known as: PRILOSEC    Take 1 capsule (40 mg total) by mouth daily.      pravastatin 40 MG tablet   Commonly known as: PRAVACHOL   Take 1 tablet (40 mg total) by mouth daily.        Follow-up Information    Follow up with Please contact PCP and arrange follow up appointment within 1 week. Schedule an appointment as soon as possible for a visit in 1 month.      Follow up with [facility] cardiology. Schedule an appointment as soon as possible for a visit in 1 week.   Contact information:   [phone]       Follow up with Gates Rigg, MD. (You will be called for an appointment within 2-3 months)    Contact information:   [address], [address] [facility] [place] [address] [phone]           The results of significant diagnostics from this hospitalization (including imaging, microbiology, ancillary and laboratory) are listed below for reference.    Significant Diagnostic Studies: Dg Chest 2 View  12/24/2011  *RADIOLOGY REPORT*  Clinical Data: Chest pressure.  Weakness.  Difficulty speaking.  CHEST - 2 VIEW  Comparison: 08/29/2010  Findings: Normal heart size and pulmonary vascularity.  No focal airspace consolidation in the lungs.  No blunting of costophrenic angles.  No pneumothorax.  Mediastinal contours appear intact. Tortuous aorta.  No significant change since previous study.  IMPRESSION: No evidence of active pulmonary disease.   Original Report Authenticated By: Marlon Pel, M.D.    Ct Head Wo Contrast  12/24/2011  *RADIOLOGY REPORT*  Clinical Data: Aphasia.  Left-sided weakness.  CT HEAD WITHOUT CONTRAST  Technique:  Contiguous axial images were obtained from the base of the skull through the vertex without contrast.  Comparison: 10/29/2006.  Findings: Bone windows demonstrate clear paranasal sinuses and mastoid air cells.  Soft tissue windows demonstrate age advanced vertebral atherosclerosis. No  mass lesion, hemorrhage, hydrocephalus, acute infarct,  intra-axial, or extra-axial fluid collection.  IMPRESSION: No acute intracranial abnormality.  Age advanced vertebral atherosclerosis.   Original Report Authenticated By: Consuello Bossier, M.D.     Microbiology: No results found for this or any previous visit (from the past 240 hour(s)).   Labs: Basic Metabolic Panel:  Lab 12/24/11 3474 12/24/11 0057  NA 135 137  K 4.6 4.1  CL 100 99  CO2 22 26  GLUCOSE 130* 170*  BUN 18 18  CREATININE 1.31 1.50*  CALCIUM 9.9 9.8  MG -- --  PHOS -- --   Liver Function Tests:  Lab 12/24/11 0402 12/24/11 0057  AST 40* 38*  ALT 35 31  ALKPHOS 72 68  BILITOT 0.4 0.5  PROT 8.0 7.7  ALBUMIN 4.0 3.7   No results found for this basename: LIPASE:5,AMYLASE:5 in the last 168 hours No results found for this basename: AMMONIA:5 in the last 168 hours CBC:  Lab 12/24/11 0402 12/24/11 0057  WBC 9.5 8.7  NEUTROABS 4.3 --  HGB 13.9 13.5  HCT  41.2 40.7  MCV 81.6 81.6  PLT 307 256   Cardiac Enzymes:  Lab 12/24/11 1620 12/24/11 0932 12/24/11 0402 12/24/11 0058  CKTOTAL -- -- -- --  CKMB -- -- -- --  CKMBINDEX -- -- -- --  TROPONINI <0.30 <0.30 <0.30 <0.30   BNP: BNP (last 3 results) No results found for this basename: PROBNP:3 in the last 8760 hours CBG:  Lab 12/25/11 1121 12/25/11 0728 12/24/11 2053 12/24/11 1626 12/24/11 1133  GLUCAP 116* 115* 108* 132* 121*    Time coordinating discharge: 45 minutes  Signed:  Gaven Eugene  [facility] 12/25/2011, 11:40 AM

## 2011-12-25 NOTE — Progress Notes (Signed)
Pt. DCed home. Discharge Pt education and medication review completed. Pt. Stated understanding.

## 2012-01-30 ENCOUNTER — Encounter: Payer: Self-pay | Admitting: Family Medicine

## 2012-01-30 NOTE — Progress Notes (Signed)
This encounter was created in error - please disregard.

## 2012-02-22 ENCOUNTER — Ambulatory Visit (INDEPENDENT_AMBULATORY_CARE_PROVIDER_SITE_OTHER): Payer: Medicaid Other | Admitting: Family Medicine

## 2012-02-22 ENCOUNTER — Telehealth: Payer: Self-pay | Admitting: *Deleted

## 2012-02-22 ENCOUNTER — Encounter: Payer: Self-pay | Admitting: Family Medicine

## 2012-02-22 VITALS — BP 168/109 | HR 85 | Ht 67.0 in | Wt 308.0 lb

## 2012-02-22 DIAGNOSIS — I10 Essential (primary) hypertension: Secondary | ICD-10-CM

## 2012-02-22 DIAGNOSIS — Z87891 Personal history of nicotine dependence: Secondary | ICD-10-CM

## 2012-02-22 DIAGNOSIS — M1990 Unspecified osteoarthritis, unspecified site: Secondary | ICD-10-CM

## 2012-02-22 DIAGNOSIS — E119 Type 2 diabetes mellitus without complications: Secondary | ICD-10-CM

## 2012-02-22 DIAGNOSIS — Z8673 Personal history of transient ischemic attack (TIA), and cerebral infarction without residual deficits: Secondary | ICD-10-CM

## 2012-02-22 DIAGNOSIS — E785 Hyperlipidemia, unspecified: Secondary | ICD-10-CM

## 2012-02-22 DIAGNOSIS — E669 Obesity, unspecified: Secondary | ICD-10-CM

## 2012-02-22 LAB — LDL CHOLESTEROL, DIRECT: Direct LDL: 130 mg/dL — ABNORMAL HIGH

## 2012-02-22 MED ORDER — METFORMIN HCL 1000 MG PO TABS: 1000.0000 mg | ORAL_TABLET | Freq: Two times a day (BID) | ORAL | Status: DC

## 2012-02-22 NOTE — Assessment & Plan Note (Signed)
He is having some feel swelling and he has occasional stomach irritation due to Naproxen. We discussed changing Naproxen to Tylenol. Follow-up if pain is not well controlled.

## 2012-02-22 NOTE — Progress Notes (Signed)
  Subjective:    Patient ID: Alex [name], male    DOB: [birth year], 56 y.o.   MRN: [id number]  HPI New patient Previously followed at [facility]  # He had a stroke October 2013 He presented with difficulty with speaking, which has resolved.  He denies focalized weakness but does report feeling tired. He is taking full dose aspirin (he was on baby aspirin prior to stroke) and Plavix He has not heard from neurologist regarding follow-up yet   # Diabetes, not on insulin, fair control Morning sugars 110-140s Compliant with metformin. Tolerating without nausea or other side effects.  # Hypertension He did not take medications this morning  # Hyperlipidemia Last LDL 113 in October. He was started on pravastatin at that time. ROS: no RUQ pain, myalgias  # History of tobacco use Smoked 1 ppd for 5-10 years Stopped 15-20 years ago  # Arthritis of knees and shoulders Well controlled on bid naproxen  ROS: endorses feet swelling, rare stomach irritation after taking medication   Review of Systems  Allergies, medication, past medical history reviewed.      Objective:   Physical Exam GEN: NAD; obese CV: RRR, normal S1/S2, no murmurs; no carotid bruits; 2+ popliteal and pedal pulses  PULM: NI WOB; CTAB without rales ABD: soft, NT, obese EXT: non-pitting pedal edema NEURO: 5/5 strength extremities x4; normal speech; no focal deficits      Assessment & Plan:

## 2012-02-22 NOTE — Assessment & Plan Note (Signed)
Elevated today. He did not take medications today.

## 2012-02-22 NOTE — Telephone Encounter (Addendum)
PA required for Nasonex, Form placed in MD box.  Form given to MD earlier for fenofibrate PA.

## 2012-02-22 NOTE — Assessment & Plan Note (Signed)
See above A/P 

## 2012-02-22 NOTE — Assessment & Plan Note (Signed)
We will check LDL today. He was started on statin 2 months ago when LDL 110s.

## 2012-02-22 NOTE — Patient Instructions (Addendum)
Increase metformin to 1 tablet (1000 mg) twice a day  Call Dr. Marlis Edelson office and ask them when they would like you to follow-up for your hospitalization for a stroke  Stop Naproxen and try Tylenol 650 mg every 12 hours for your arthritis pain   If your lab results are normal, I will send you a letter with the results. If abnormal, someone at the clinic will get in touch with you.   Follow-up in 2 weeks if your arthritis pain is not controlled with the Tylenol. Otherwise follow-up in 4 weeks.

## 2012-02-22 NOTE — Assessment & Plan Note (Signed)
>>  ASSESSMENT AND PLAN FOR DIABETES TYPE 2, CONTROLLED WRITTEN ON 02/22/2012  9:16 AM BY OH PARK, ANGELA J, MD  Last HgbA1c 7.1 (12/2011). We will increase metformin to full dose from 500 to 1000 bid. We discussed losing weight. He has lost about 10 pounds over the past 2 months by eating healthier; we discussed incorporating some exercise; he says he will consider it.  He is on full dose aspirin and lisinopril.

## 2012-02-22 NOTE — Assessment & Plan Note (Signed)
He was advised to call and number given for Dr. Pearlean Brownie to arrange follow-up.

## 2012-02-22 NOTE — Assessment & Plan Note (Addendum)
Last HgbA1c 7.1 (12/2011). We will increase metformin to full dose from 500 to 1000 bid. We discussed losing weight. He has lost about 10 pounds over the past 2 months by eating healthier; we discussed incorporating some exercise; he says he will consider it.  He is on full dose aspirin and lisinopril.

## 2012-02-25 ENCOUNTER — Telehealth: Payer: Self-pay | Admitting: Family Medicine

## 2012-02-25 MED ORDER — ATORVASTATIN CALCIUM 40 MG PO TABS: 40.0000 mg | ORAL_TABLET | Freq: Every day | ORAL | Status: DC

## 2012-02-25 MED ORDER — FLUTICASONE PROPIONATE 50 MCG/ACT NA SUSP: 2.0000 | Freq: Every day | NASAL | Status: DC

## 2012-02-25 NOTE — Telephone Encounter (Signed)
Will change Nasonex to Flonase.   Discussed elevated LDL 130 with goal < 70. Will change pravastatin to atorvastatin. We discussed potential adverse effects. Patient amenable to changing medications.

## 2012-03-06 ENCOUNTER — Other Ambulatory Visit: Payer: Self-pay | Admitting: *Deleted

## 2012-03-06 MED ORDER — AMLODIPINE BESYLATE 5 MG PO TABS: 5.0000 mg | ORAL_TABLET | Freq: Every day | ORAL | Status: DC

## 2012-03-07 ENCOUNTER — Telehealth: Payer: Self-pay | Admitting: *Deleted

## 2012-03-07 ENCOUNTER — Telehealth: Payer: Self-pay | Admitting: Family Medicine

## 2012-03-07 MED ORDER — FENOFIBRATE 54 MG PO TABS: 54.0000 mg | ORAL_TABLET | Freq: Every day | ORAL | Status: DC

## 2012-03-07 NOTE — Telephone Encounter (Signed)
Patient is calling about a refill on Fenofibrate.  He is completely out of this med.  He uses [facility] and they were to send a refill request over.

## 2012-03-07 NOTE — Telephone Encounter (Signed)
I spoke with patient. He is not sure why he is taking it. I imagine it is because he had a stroke and lipid panel showed elevated TG along with elevated other labs.  We will just try atorvastatin for now and re-check cholesterol in 3 months.  He is amenable to plan.

## 2012-03-07 NOTE — Telephone Encounter (Signed)
Please notify patient Rx sent

## 2012-03-07 NOTE — Telephone Encounter (Signed)
Received a fax from [facility].  Refill on Fenofribrate requires a prior authorization.  Please cal [phone] for the PA.  Please notify pharmacy when PA received.

## 2012-03-14 ENCOUNTER — Telehealth: Payer: Self-pay | Admitting: Family Medicine

## 2012-03-14 NOTE — Telephone Encounter (Signed)
No answer at either number. If pt calls back please as if he has a certain question or if it is about all medications? Alex [name], Alex [name]

## 2012-03-14 NOTE — Telephone Encounter (Signed)
[phone] is an alternate number.  Patient needs clarification on his medications.

## 2012-03-24 ENCOUNTER — Ambulatory Visit (HOSPITAL_COMMUNITY)
Admission: RE | Admit: 2012-03-24 | Discharge: 2012-03-24 | Disposition: A | Discharge status: Home / Self Care | Payer: Medicaid Other | Source: Ambulatory Visit | Attending: Family Medicine | Admitting: Family Medicine

## 2012-03-24 ENCOUNTER — Encounter: Payer: Self-pay | Admitting: Family Medicine

## 2012-03-24 ENCOUNTER — Ambulatory Visit (INDEPENDENT_AMBULATORY_CARE_PROVIDER_SITE_OTHER): Payer: Medicare Other | Admitting: Family Medicine

## 2012-03-24 VITALS — BP 155/105 | HR 82 | Ht 67.0 in | Wt 311.0 lb

## 2012-03-24 DIAGNOSIS — I10 Essential (primary) hypertension: Secondary | ICD-10-CM

## 2012-03-24 DIAGNOSIS — R079 Chest pain, unspecified: Secondary | ICD-10-CM

## 2012-03-24 DIAGNOSIS — E785 Hyperlipidemia, unspecified: Secondary | ICD-10-CM

## 2012-03-24 MED ORDER — AMLODIPINE BESYLATE 5 MG PO TABS: 10.0000 mg | ORAL_TABLET | Freq: Every day | ORAL | Status: DC

## 2012-03-24 NOTE — Progress Notes (Signed)
  Subjective:    Patient ID: Alex [name], male    DOB: [birth year], 57 y.o.   MRN: [id number]  HPI: Pt is a 57yo male with PMH significant for hx of CVA, HTN, and DM who presents today with complaint of elevated BP. Pt is a generally poor historian and is vague/uncertain about many details of history (both chronic and HPI). Pt reports he was at [facility] today for f/u of a study medication ("blood thinner," ?Plavix, of which he finished his last dose this past Saturday); at that office, he was found to be severely hypertensive (163/101 at 1349, 167/109 at 1428, and 153/104 at 1449), and was referred here to his PCP's office. He reports compliance with all of his BP meds, Norvasc 5 daily, metoprolol 25 BID (has not taken PM dose), and lisinopril 20 daily. He denies current headache, though he has had some "off and on" for many weeks. He also denies current vision changes, N/V, new weakness/facial asymmetry/confusion. He does endorse "maybe some mild chest pain," off and on "maybe in the past week." He cannot quantify or more accurately describe his chest pain other than "maybe a little twinge for a couple of seconds, now and then, so mild I don't usually notice it or pay attention to it." He cannot identify specific triggers for his pain; it does not appear definitely exertional nor solely at rest. Pt does note that he feels more "run down and sleepier" that he attributes to stopping fenofibrate and/or changing from pravachol to atorvastatin; denies muscle aches or change in bowel/bladder habits.  Review of Systems: As above. Otherwise denies SOB other than "a little bit I wouldn't have noticed if you didn't ask," no fevers/chills, cough/congestion. No sick contacts.     Objective:   Physical Exam BP 155/105  Pulse 82  Ht 5\' 7"  (1.702 m)  Wt 311 lb (141.069 kg)  BMI 48.71 kg/m2 Gen: adult male in NAD, pleasant and cooperative; not diaphoretic, speaking in full sentences, in good  spirits HEENT: MMM, conjunctivae clear Pulm: CTAB, no wheezes Cardio/Chest: RRR, no murmur appreciated; no chest wall tenderness Abd: soft, nontender, nondistended Ext: moves all extremities equally/spontaneously, distal pulses intact/symmetric MSK/Neuro: normal gait, sits/stands/moves without assistance, no gross focal deficit; equal strength bilaterally, no facial asymmetry or slurred speech     Assessment & Plan:

## 2012-03-24 NOTE — Assessment & Plan Note (Signed)
A: Severely elevated, especially diastolic (rechecked manually by me, 155/105). Pt reports he took his normal morning medications and denies noncompliance of missing doses. Denies symptoms, though there is some possible anginal-type pain; EKG shows NSR with no ST changes. P: Advised pt to increase amlodipine from 5 mg to 10 mg daily and to check BP's at home over the next two days. Instructed pt to come in for RN BP check in two days, then to follow up with PCP within 1-2 weeks. Reviewed red flags for stroke or cardiac-type pain and when to call clinic or to report to the ED. If increasing amlodipine is not helpful, could consider adding HCTZ and/or increasing lisinopril or metoprolol.

## 2012-03-24 NOTE — Assessment & Plan Note (Signed)
A: Reports recent change from pravachol/fenofibrate to atorvastatin. Attributes some fatigue/sleepiness to atrovastatin. P: Advised pt on controlling BP (main reason for visit today; see problem list note for HTN) and instructed him to schedule follow-up with PCP Dr. Madolyn Frieze to address long-term medication changes, if any. Continue atorvastatin for now.

## 2012-03-24 NOTE — Assessment & Plan Note (Signed)
A: Pt is a generally poor historian, but pain seems to be anginal in nature, without definite triggers. EKG today however not concerning for ischemia. P: Advised changes to HTN meds (inc Norvasc, see problem list note for HTN). Given hx of stroke and very high HTN and pt's general state of health, could consider outpt cardiology referral.

## 2012-03-24 NOTE — Patient Instructions (Signed)
Thank you for coming in, today! It was nice to meet you. Your EKG looks okay, so I am comfortable with adjusting your medications and having you come back in 2-3 days. Take TWO of your amlodipine pills (10 mg total), once per day. We may need to adjust some of your other medications, if your blood pressure continues to be high. If you have any questions or concerns, feel free to call at any time. If you start to have worse, constant chest pain or if you start to feel weak (especially on one side more than the other), call the clinic or go to the emergency room. --Dr. Casper Harrison

## 2012-03-27 ENCOUNTER — Ambulatory Visit (INDEPENDENT_AMBULATORY_CARE_PROVIDER_SITE_OTHER): Payer: Medicaid Other | Admitting: *Deleted

## 2012-03-27 VITALS — BP 153/100

## 2012-03-27 DIAGNOSIS — I10 Essential (primary) hypertension: Secondary | ICD-10-CM

## 2012-03-28 MED ORDER — LISINOPRIL 20 MG PO TABS: 40.0000 mg | ORAL_TABLET | Freq: Every day | ORAL | Status: DC

## 2012-03-28 NOTE — Progress Notes (Signed)
Patient in for BP check as directed by MD.  BP checked with dynamap RA 15./100 and LA 155/93.  Pulse 92. Patient reports he is taking BP meds however 2 days ago decided to stop Lipitor because he thought it may contributing to his high BP.   States since he started taking Lipitor a month ago has experienced  Fatigue , sleepiness, cramping in stomach ,  legs and hands. He did not mention this to doctor at last visit. Consulted with Dr. Earnest Bailey  And she advises for patient to double up on lisinopril take 40 mg daily. Recommended to restart Lipitor and give another trial If symptoms return let us know.  Appointment scheduled with Dr. Madolyn Frieze for 01/22.  Advised of importance of coming to this appointment because bloodwork will need to be checked after increasing lisinopril.  He voices understanding.

## 2012-03-31 ENCOUNTER — Telehealth: Payer: Self-pay | Admitting: *Deleted

## 2012-03-31 NOTE — Telephone Encounter (Signed)
Received message on pharmacy/physician line from Wes at [facility].  He states Mr. Sheerin called their office today requesting a MRI of his brain.  They can not see where one has been ordered.  I returned call to [facility] and advised him that Mr. Gee has an appointment with Dr. Madolyn Frieze on 04/02/2012 and he can discuss this with her at that time.  Ileana Ladd

## 2012-04-01 ENCOUNTER — Other Ambulatory Visit: Payer: Self-pay | Admitting: Family Medicine

## 2012-04-02 ENCOUNTER — Encounter: Payer: Self-pay | Admitting: Family Medicine

## 2012-04-02 ENCOUNTER — Ambulatory Visit (INDEPENDENT_AMBULATORY_CARE_PROVIDER_SITE_OTHER): Payer: Medicaid Other | Admitting: Family Medicine

## 2012-04-02 VITALS — BP 149/105 | HR 75 | Temp 98.3°F | Ht 67.0 in | Wt 306.0 lb

## 2012-04-02 DIAGNOSIS — E785 Hyperlipidemia, unspecified: Secondary | ICD-10-CM

## 2012-04-02 DIAGNOSIS — I5030 Unspecified diastolic (congestive) heart failure: Secondary | ICD-10-CM

## 2012-04-02 DIAGNOSIS — E119 Type 2 diabetes mellitus without complications: Secondary | ICD-10-CM

## 2012-04-02 DIAGNOSIS — I10 Essential (primary) hypertension: Secondary | ICD-10-CM

## 2012-04-02 LAB — POCT GLYCOSYLATED HEMOGLOBIN (HGB A1C): Hemoglobin A1C: 6.2

## 2012-04-02 MED ORDER — AMLODIPINE BESYLATE 10 MG PO TABS: 10.0000 mg | ORAL_TABLET | Freq: Every day | ORAL | Status: DC

## 2012-04-02 MED ORDER — LISINOPRIL 40 MG PO TABS: 40.0000 mg | ORAL_TABLET | Freq: Every day | ORAL | Status: DC

## 2012-04-02 MED ORDER — PRAVASTATIN SODIUM 20 MG PO TABS: 20.0000 mg | ORAL_TABLET | Freq: Every day | ORAL | Status: DC

## 2012-04-02 MED ORDER — METOPROLOL TARTRATE 50 MG PO TABS: 50.0000 mg | ORAL_TABLET | Freq: Two times a day (BID) | ORAL | Status: DC

## 2012-04-02 NOTE — Patient Instructions (Signed)
CONTINUE lisinopril 40 mg daily CONTINUE amlodipine 10 mg daily INCREASE metoprolol to 50 mg twice a day  Call me in 1 week and let me know where your blood pressures have been (measure when you have been seated for 5 minutes and feel calm)  Follow-up in 2 weeks  We will discuss your diabetes at that time     For your cholesterol -Try new medication pravastatin

## 2012-04-02 NOTE — Progress Notes (Signed)
  Subjective:    Patient ID: Alex [name], male    DOB: November 01, 1955, 57 y.o.   MRN: [id number]  HPI # Hypertension He was seen recently here and his Norvasc was increased from 5 to 10 He has been measuring his blood pressures  130-160s/100-110s for the past 3 days ROS: denies chest pain, difficulty breathing; endorses ?leg swelling  # HLD He tried atorvastatin again but it made him feel tired and have muscle aches and the symptoms went away after he stopped  Review of Systems Per HPI  Allergies, medication, past medical history reviewed.  Smoking status noted. -history of stroke -grade 1 diastolic heart failure     Objective:   Physical Exam Gen: NAD; well-appearing; morbidly obese CV: RRR, normal S1/S2, no m/r/g PULM: NI WOB; CTAB without w/r/r EXT: trace pretibial edema    Assessment & Plan:

## 2012-04-02 NOTE — Assessment & Plan Note (Signed)
Atorvastatin seems to cause myalgias.  We will try pravastatin. If this causes significant side effects, re-start fenofibrate which he tolerated.

## 2012-04-02 NOTE — Assessment & Plan Note (Signed)
Remains elevated, especially diastolic pressures. Already on maximum doses of lisinopril and Norvasc. Increase metoprolol from 25 to 50 bid. Discussed potential side effects of fatigue, lightheadedness.  Call in 1 week and let me know what his blood pressures have been. Consider increasing metoprolol at that time. Then clinic follow-up in 2 weeks.  May consider adding diuretic. Diastolic heart failure seen on ECHO and presenting with trace pedal edema and his being African-American may benefit blood pressures.

## 2012-04-07 ENCOUNTER — Other Ambulatory Visit: Payer: Self-pay | Admitting: Family Medicine

## 2012-04-07 MED ORDER — METOPROLOL TARTRATE 50 MG PO TABS: 50.0000 mg | ORAL_TABLET | Freq: Two times a day (BID) | ORAL | Status: DC

## 2012-04-21 ENCOUNTER — Encounter: Payer: Self-pay | Admitting: Family Medicine

## 2012-04-21 ENCOUNTER — Ambulatory Visit (INDEPENDENT_AMBULATORY_CARE_PROVIDER_SITE_OTHER): Payer: Medicaid Other | Admitting: Family Medicine

## 2012-04-21 ENCOUNTER — Telehealth: Payer: Self-pay | Admitting: Family Medicine

## 2012-04-21 VITALS — BP 146/83 | HR 70 | Ht 67.0 in | Wt 310.0 lb

## 2012-04-21 DIAGNOSIS — I10 Essential (primary) hypertension: Secondary | ICD-10-CM

## 2012-04-21 DIAGNOSIS — E785 Hyperlipidemia, unspecified: Secondary | ICD-10-CM

## 2012-04-21 DIAGNOSIS — K921 Melena: Secondary | ICD-10-CM

## 2012-04-21 DIAGNOSIS — Z8673 Personal history of transient ischemic attack (TIA), and cerebral infarction without residual deficits: Secondary | ICD-10-CM

## 2012-04-21 NOTE — Assessment & Plan Note (Signed)
Fair control today. Home blood pressures seem more elevated, particularly diastolic. Follow-up in 1 week for nurse blood pressure check; he was advised to bring home cuff. Add HCTZ if remains persistently elevated; consider weaning off Norvasc if blood pressures improved with HCTZ.

## 2012-04-21 NOTE — Assessment & Plan Note (Signed)
He reports every 6 months notices blood in stool. He is due for a colonoscopy. We will refer.

## 2012-04-21 NOTE — Patient Instructions (Addendum)
Follow-up in 1 week for a nurse visit to re-check your blood pressure   Bring your home cuff with you so we can compare  Follow-up in May 2014. Make a morning appointment so we can re-check your cholesterol.   STOP aspirin 325 mg. START aspirin 81 mg.

## 2012-04-21 NOTE — Assessment & Plan Note (Signed)
Tolerating pravastatin. Re-check cholesterol panel around 07/2012.

## 2012-04-21 NOTE — Telephone Encounter (Signed)
Pt forgot to mention this morning that he has been having problems with his vision going out - been going on off and on for 2 years - wants to speak to Dr Madolyn Frieze to see what she would suggest.

## 2012-04-21 NOTE — Progress Notes (Signed)
  Subjective:    Patient ID: Alex [name], male    DOB: [birth year], 57 y.o.   MRN: [id number]  HPI # Hypertension follow-up Metoprolol was increased from 25 to 50 bid. He denies lightheadedness or fatigue. Home blood pressures: usually 130-150s/100s. ROS: denies chest pain, difficulty breathing, leg swelling  # History of TIA. Presented 12/2011 with aphasia for 3-5 minutes. MRI could not be performed due to body habitus. He was enrolled in POINT study. He has since stopped Plavix/placebo. He is taking full dose aspirin.  ROS: denies aphasia/weakness since hospitalization  # History of blood in stool. This happens every 6 months. He has Medicaid and has never had a colonoscopy. He is not sure if there is any family history of colon cancer.   # Hyperlipidemia He is taking pravastatin. ROS: denies abdominal pain, myalgias  Review of Systems Per HPI  Allergies, medication, past medical history reviewed.  Smoking status noted.     Objective:   Physical Exam GEN: NAD; obese CV: RRR, no murmurs PULM: NI WOB; CTAB without w/r/r EXT: no edema  ABD: NABS, soft, NT, ND NEURO: no focal deficits, moves all extremities well, no slurred speech    Assessment & Plan:  Follow-up in 1 week for a nurse visit to re-check your blood pressure   Bring your home cuff with you so we can compare  Follow-up in May 2014. Make a morning appointment so we can re-check your cholesterol.   STOP aspirin 325 mg. START aspirin 81 mg.

## 2012-04-21 NOTE — Telephone Encounter (Signed)
Please recommend that he come in and be seen regarding his blurred vision. We may consider referring him eye doctor versus examining his eyes with our eye machine in clinic.  Thank you.

## 2012-04-21 NOTE — Telephone Encounter (Signed)
Will forward to Dr. Oh Park   

## 2012-04-28 ENCOUNTER — Ambulatory Visit (INDEPENDENT_AMBULATORY_CARE_PROVIDER_SITE_OTHER): Payer: Medicaid Other | Admitting: *Deleted

## 2012-04-28 VITALS — Wt 306.5 lb

## 2012-04-28 DIAGNOSIS — I10 Essential (primary) hypertension: Secondary | ICD-10-CM

## 2012-04-28 NOTE — Progress Notes (Signed)
Patient in for BP check today. Brought his monitor in also to compare.    BP readings in office today  seem to be inconsistent.      With his monitor  LA 140/93.  Then checked manually  LA 136/106. With our dynamap 132/91.  Then 140/100 with his monitor.  This was over a 20 minute period of time.   RA 156/ 109 his monitor, 140/110 manually and 136/98 with dynamap.    Patient reports episodes of blurred vision for 2 years. Patient called back after last office visit to tell MD about this because he forgot to mention at visit . Dr. Madolyn Frieze had entered message on   2/10 for patient to make appointment to discuss this .  Weight 306.5 lb.  Consulted with Dr. Mahala Menghini and he advises for patient to return in one week to see Dr. Madolyn Frieze.  Patient denies any blurry vision , chest pain or headache today.   Reviewed all meds. Reports he is taking BP meds as directed.    Blood Sugars ranging between 100-130 fasting in AM . Has only been taking metformin 500 mg daily, not 1000 mg twice daily  as listed on med list.      Also takes 20 mg omeprazole daily instead of 40 mg. Will send this message to Dr. Madolyn Frieze.  Appointment scheduled 02/24 with Dr. Madolyn Frieze.

## 2012-04-29 ENCOUNTER — Encounter: Payer: Self-pay | Admitting: Internal Medicine

## 2012-04-29 ENCOUNTER — Telehealth: Payer: Self-pay | Admitting: Family Medicine

## 2012-04-29 DIAGNOSIS — I10 Essential (primary) hypertension: Secondary | ICD-10-CM

## 2012-04-29 DIAGNOSIS — H538 Other visual disturbances: Secondary | ICD-10-CM

## 2012-04-29 NOTE — Assessment & Plan Note (Signed)
Telephone. We discussed nurse visit blood pressures. Due to discrepancy between his blood pressure readings at home (they are lower) than at clinic and his home blood pressure cuff reading higher than manual, I am hesitant to go up on his medications at this time based on clinic readings. He will follow-up with Dr. Raymondo Band of ambulatory blood pressure monitoring.

## 2012-04-29 NOTE — Telephone Encounter (Signed)
See documentation under "hypertension" and "blurry vision".

## 2012-05-05 ENCOUNTER — Ambulatory Visit (INDEPENDENT_AMBULATORY_CARE_PROVIDER_SITE_OTHER): Payer: Medicaid Other | Admitting: Pharmacist

## 2012-05-05 ENCOUNTER — Ambulatory Visit: Payer: Medicaid Other | Admitting: Family Medicine

## 2012-05-05 ENCOUNTER — Encounter: Payer: Self-pay | Admitting: Pharmacist

## 2012-05-05 VITALS — BP 134/90 | HR 63 | Ht 67.5 in | Wt 307.9 lb

## 2012-05-05 DIAGNOSIS — I10 Essential (primary) hypertension: Secondary | ICD-10-CM

## 2012-05-05 NOTE — Patient Instructions (Addendum)
Change your Amlodipine and your lisinopril to night (bedtime) dosing.   Thanks for meeting with Dr. Ashley Royalty today.

## 2012-05-05 NOTE — Progress Notes (Signed)
Subjective:    Patient ID: Alex [name], male    DOB: 1955-09-01, 57 y.o.   MRN: [id number]  HPI  Alex [name] presented to the clinic for 24-hour ambulatory blood pressure monitoring after referral by Dr. Madolyn [name].  Medication compliance is reported to be excellent with use of a pill box.  Patient reports poor sleep hygiene since the loss of his wife 2 years prior.  He has NOT slept in his bed since her death.  He reports waking multiple times per night.   He sleeps in front of the television.   Discussed procedure for wearing the monitor and gave patient written instructions. Monitor was placed on non-dominant arm (Left) with instructions to return in the morning. Patient returned to the clinic 2/25 and reported no issues with blood pressure monitoring other than cuff sliding down arm at times.     However, patient noted having shortness of breath when he tried to lay down at 10 PM.   He noted he has been using 6 pillows for several months.    He continued with a complaint of chest pressure at 11 AM yesterday.   He notes the mid-sternal chest pain to be like a "toothache" and states this pain comes out 2-3 times per week and has been happening for the last 2-3 months.   Current BP Medications Lisinopril, amlodipine and metoprolol.  Other antihypertensives tried in the past: None  Allergies  Allergen Reactions  . Atorvastatin Other (See Comments)    Subjective myalgias. Gave 2 trials, developed myalgias which resolved after cessation of medication.  Muscle cramps were 9/10 discomfort  . Codeine Nausea Only           Review of Systems     Objective:   Physical Exam NO peripheral leg edema today in office.  States his edema is worse when he eats potato chips.    Last 3 Office BP readings: 146/83 mmHg 149/105 mmHg 153/100 mmHg  Today's Office BP reading: 134/90 mmHg (manual reading)  ABPM Study Data: Arm Placement left arm   Overall Mean 24hr BP:   137/89 mmHg HR:  69   Daytime Mean BP:  140/92 mmHg HR: 73   Nighttime Mean BP:  130/83 mmHg HR: 61   Dipping Pattern: no  Sys:   7.4%   Dia: 9.1   [normal dipping ~10-20%]   Non-hypertensive ABPM thresholds: daytime BP <135/85 mmHg, sleeptime BP <120/75 mmHg NICE Hypertension Guidelines ([place]) using ABPM: Stage I: >135/85 mmHg, Stage 2: >150/95 mmHg)   BMET    Component Value Date/Time   NA 135 12/24/2011 0402   K 4.6 12/24/2011 0402   CL 100 12/24/2011 0402   CO2 22 12/24/2011 0402   GLUCOSE 130* 12/24/2011 0402   BUN 18 12/24/2011 0402   CREATININE 1.31 12/24/2011 0402   CALCIUM 9.9 12/24/2011 0402   GFRNONAA 59* 12/24/2011 0402   GFRAA 69* 12/24/2011 0402          Assessment & Plan:   Alex [name] completed a 24-hour ambulatory blood pressure study with near goal control of hypertension (BP at goal) based on average blood pressure of 137/89 mmHg, with a nocturnal dipping pattern that is abnormal  slighly less than goal reductions. .  Changes to medications included  nocturnal dosing of lisinpril and amlodipine.    Deferred evaluation of chest pain/pressure to work in MD - Dr. Ashley [name] today 2/25 following discussion with Dr. Jennette [name].  Regular follow-up with PCP Dr. Bluford [name]  Park. Marland Kitchen

## 2012-05-06 ENCOUNTER — Ambulatory Visit (INDEPENDENT_AMBULATORY_CARE_PROVIDER_SITE_OTHER): Payer: Medicaid Other | Admitting: Family Medicine

## 2012-05-06 ENCOUNTER — Encounter: Payer: Self-pay | Admitting: Family Medicine

## 2012-05-06 ENCOUNTER — Ambulatory Visit (HOSPITAL_COMMUNITY)
Admission: RE | Admit: 2012-05-06 | Discharge: 2012-05-06 | Disposition: A | Discharge status: Home / Self Care | Payer: Medicaid Other | Source: Ambulatory Visit | Attending: Family Medicine | Admitting: Family Medicine

## 2012-05-06 VITALS — BP 135/95 | HR 95 | Ht 67.0 in | Wt 305.0 lb

## 2012-05-06 DIAGNOSIS — R0609 Other forms of dyspnea: Secondary | ICD-10-CM

## 2012-05-06 DIAGNOSIS — G479 Sleep disorder, unspecified: Secondary | ICD-10-CM

## 2012-05-06 DIAGNOSIS — R079 Chest pain, unspecified: Secondary | ICD-10-CM | POA: Insufficient documentation

## 2012-05-06 DIAGNOSIS — R0600 Dyspnea, unspecified: Secondary | ICD-10-CM

## 2012-05-06 DIAGNOSIS — E669 Obesity, unspecified: Secondary | ICD-10-CM

## 2012-05-06 DIAGNOSIS — R0989 Other specified symptoms and signs involving the circulatory and respiratory systems: Secondary | ICD-10-CM

## 2012-05-06 DIAGNOSIS — I10 Essential (primary) hypertension: Secondary | ICD-10-CM

## 2012-05-06 LAB — BASIC METABOLIC PANEL
BUN: 13 mg/dL (ref 6–23)
CO2: 25 mEq/L (ref 19–32)
Calcium: 9.8 mg/dL (ref 8.4–10.5)
Chloride: 104 mEq/L (ref 96–112)
Creat: 1.1 mg/dL (ref 0.50–1.35)
Glucose, Bld: 102 mg/dL — ABNORMAL HIGH (ref 70–99)
Potassium: 4.3 mEq/L (ref 3.5–5.3)
Sodium: 139 mEq/L (ref 135–145)

## 2012-05-06 LAB — PRO B NATRIURETIC PEPTIDE: Pro B Natriuretic peptide (BNP): 14.98 pg/mL (ref <126)

## 2012-05-06 NOTE — Patient Instructions (Addendum)
Thank you for coming in today, it was good to see you I have ordered some lab work today.  I am going to schedule you for an appointment at [facility] They will schedule you for a sleep study as well.

## 2012-05-06 NOTE — Progress Notes (Signed)
  Subjective:    Patient ID: Alex [name], male    DOB: 02-27-1956, 57 y.o.   MRN: [id number]  HPI  1. Chest pain:  Patient here with c/o of intermittent chest pain for the past three weeks.  He has hsitory of MI and TIA and is followed by [facility].  Pain is located substernally and described as sharp pain that lasts a few seconds.  Pain does not radiate and not associated with nausea, shortness of breath or diaphoresis.  He had a cath in 2002 that showed 25% RCA stenosis as well as ECHO in 10/2011 that showed diastolic failure.  He does endorse some occasional leg swelling and has had to sleep on 5-6 pillows each night for the past 6 months.  He does wake up frequently with a "drowning" feeling.  He does not feel sob when is awake during the day.  He does endorse frequent snoring.   Review of Systems Per HPI     Objective:   Physical Exam  Constitutional: He appears well-nourished. No distress.  HENT:  Head: Normocephalic and atraumatic.  Neck: Neck supple. No thyromegaly present.  Cardiovascular: Normal rate, regular rhythm and normal heart sounds.   Pulmonary/Chest: Effort normal and breath sounds normal. No respiratory distress. He has no wheezes. He has no rales.  Abdominal: Soft. Bowel sounds are normal. He exhibits no distension. There is no tenderness.  Musculoskeletal: He exhibits no edema.  Neurological: He is alert.          Assessment & Plan:

## 2012-05-06 NOTE — Assessment & Plan Note (Signed)
Alex [name] completed a 24-hour ambulatory blood pressure study with near goal control of hypertension (BP at goal) based on average blood pressure of 137/89 mmHg, with a nocturnal dipping pattern that is abnormal  slighly less than goal reductions. .  Changes to medications included  nocturnal dosing of lisinpril and amlodipine.    Deferred evaluation of chest pain/pressure to work in MD - Dr. Ashley Royalty today 2/25 following discussion with Dr. Jennette Kettle.  Regular follow-up with PCP Dr. Madolyn Frieze. Marland Kitchen

## 2012-05-06 NOTE — Assessment & Plan Note (Addendum)
Intermittent chest pain does not sound cardiac in nature however he does have a possible new inferior infarct on EKG with q waves in AVF.  I think he needs to follow up with his cardiologist and we will call to schedule him back with them in the near future.  His dyspnea while sleeping is concerning as well.  His ECHO in 2013 showed preserved EF with diastolic dysfunction.  I will check a BNP today.  He may also have a component of OSA contributing to some of his symptoms given his body habitus, frequent snoring and elevated PA pressures on last ECHO.   Will refer to sleep study as well.   Discussed with Dr. Jennette Kettle who agrees with plan.

## 2012-05-07 ENCOUNTER — Other Ambulatory Visit: Payer: Self-pay | Admitting: Family Medicine

## 2012-05-07 DIAGNOSIS — E785 Hyperlipidemia, unspecified: Secondary | ICD-10-CM

## 2012-05-08 MED ORDER — PRAVASTATIN SODIUM 40 MG PO TABS: 40.0000 mg | ORAL_TABLET | Freq: Every day | ORAL | Status: DC

## 2012-05-08 NOTE — Progress Notes (Signed)
Patient ID: Alex [name], male   DOB: 10/12/1955, 57 y.o.   MRN: [id number] Reviewed:  Agree with Dr. Koval's management and documentation. 

## 2012-05-08 NOTE — Assessment & Plan Note (Signed)
Telephone. Refill request for Pravachol. I thought we had discussed changing to atorvastatin at some point.  He is still on Pravachol. He reports myalgias x 2 on atorvastatin.  LDL on Pravachol 20 was 130 (02/2012). We will increase Pravachol from 20 to 40 mg. Then increase to 80 if tolerates 40 mg.  He will follow-up with me 03/04. He is amenable to this plan.

## 2012-05-08 NOTE — Progress Notes (Signed)
Patient ID: Alex [name], male   DOB: April 13, 1955, 57 y.o.   MRN: [id number] Reviewed:  Agree with Dr. Macky Lower management and documentation.

## 2012-05-08 NOTE — Telephone Encounter (Signed)
Pt is returning call to [facility]

## 2012-05-08 NOTE — Telephone Encounter (Signed)
Telephone. Refill request for Pravachol. I thought we had discussed changing to atorvastatin at some point.  He is still on Pravachol. He reports myalgias x 2 on atorvastatin.  LDL on Pravachol 20 was 130 (02/2012). We will increase Pravachol from 20 to 40 mg. Then increase to 80 if tolerates 40 mg.  He will follow-up with me 03/04. He is amenable to this plan.   

## 2012-05-09 NOTE — Telephone Encounter (Signed)
He was wondering about lab tests from prior office visit. Notified normal pro-BNP and BMET.   He also started doubling up on pravastatin and does not report myalgias at this time.

## 2012-05-13 ENCOUNTER — Ambulatory Visit: Payer: Medicaid Other | Admitting: Family Medicine

## 2012-05-14 ENCOUNTER — Ambulatory Visit (INDEPENDENT_AMBULATORY_CARE_PROVIDER_SITE_OTHER): Payer: Medicare Other | Admitting: Internal Medicine

## 2012-05-14 ENCOUNTER — Encounter: Payer: Self-pay | Admitting: Internal Medicine

## 2012-05-14 VITALS — BP 120/72 | HR 76 | Ht 67.5 in | Wt 311.0 lb

## 2012-05-14 DIAGNOSIS — I5030 Unspecified diastolic (congestive) heart failure: Secondary | ICD-10-CM

## 2012-05-14 DIAGNOSIS — R079 Chest pain, unspecified: Secondary | ICD-10-CM

## 2012-05-14 DIAGNOSIS — K921 Melena: Secondary | ICD-10-CM

## 2012-05-14 NOTE — Patient Instructions (Addendum)
  We have made you a follow up appointment with Dr. Verne Carrow  At [address] On April 9th at 4:30pm, arrive at 4:15pm to check in.  Thank you for choosing me and [facility].  Iva Boop, M.D., [facility]

## 2012-05-14 NOTE — Progress Notes (Signed)
Subjective:    Patient ID: Alex [name], male    DOB: 1955/11/13, 57 y.o.   MRN: [id number]  HPI Over the past 3 years intermittent small volume hematochezia - red and mixed in with stool. Has irregular bowel habits also. Having fleeting chest pains with activity or without - last only 15 seconds maximum. No dysphagia after 2010 esophageal dilation and chronic PPI  Allergies  Allergen Reactions  . Atorvastatin Other (See Comments)    Subjective myalgias. Gave 2 trials, developed myalgias which resolved after cessation of medication.  Muscle cramps were 9/10 discomfort  . Codeine Nausea Only   Outpatient Prescriptions Prior to Visit  Medication Sig Dispense Refill  . albuterol (PROVENTIL HFA;VENTOLIN HFA) 108 (90 BASE) MCG/ACT inhaler Inhale 2 puffs into the lungs every 6 (six) hours as needed.  1 each  1  . amLODipine (NORVASC) 10 MG tablet Take 1 tablet (10 mg total) by mouth at bedtime.      Marland Kitchen aspirin 81 MG chewable tablet Chew 81 mg by mouth daily.      . cetirizine (ZYRTEC) 10 MG tablet Take 1 tablet (10 mg total) by mouth daily as needed.  90 tablet  3  . fluticasone (FLONASE) 50 MCG/ACT nasal spray Place 2 sprays into the nose daily.  16 g  6  . lisinopril (PRINIVIL,ZESTRIL) 40 MG tablet Take 1 tablet (40 mg total) by mouth at bedtime.      . metFORMIN (GLUCOPHAGE) 1000 MG tablet Take 1 tablet (1,000 mg total) by mouth 2 (two) times daily with a meal.  180 tablet  3  . metoprolol (LOPRESSOR) 50 MG tablet Take 1 tablet (50 mg total) by mouth 2 (two) times daily.  180 tablet  3  . naproxen (NAPROSYN) 500 MG tablet Take 500 mg by mouth daily.      Marland Kitchen omeprazole (PRILOSEC) 40 MG capsule Take 1 capsule (40 mg total) by mouth daily.  90 capsule  3  . pravastatin (PRAVACHOL) 40 MG tablet Take 1 tablet (40 mg total) by mouth daily.  30 tablet  0   No facility-administered medications prior to visit.   Past Medical History  Diagnosis Date  . HTN (hypertension)   . Diabetes mellitus    . GERD (gastroesophageal reflux disease)   . CAD (coronary artery disease)     Cardiac cath 2002 with 25% distal RCA stenosis.   . Hyperlipidemia   . Asthma   . MI (myocardial infarction) 2009    no stent placement  . Gout   . Obesity   . TIA (transient ischemic attack) 2013  . Esophageal stricture   . OA (osteoarthritis)   . Insomnia   . DDD (degenerative disc disease), lumbar   . Gastritis 2010   Past Surgical History  Procedure Laterality Date  . Vasectomy    . Nasal sinus surgery    . Esophagogastroduodenoscopy      multiple   History   Social History  . Marital Status: Widowed    Spouse Name: N/A    Number of Children: 4  . Years of Education: N/A   Occupational History  . Disability    Social History Main Topics  . Smoking status: Former Smoker -- 1.50 packs/day for 10 years    Types: Cigarettes    Quit date: 11/06/1991  . Smokeless tobacco: Never Used     Comment: quit 1993  . Alcohol Use: No  . Drug Use: No    Social History Narrative  On disability   [employer]   Quit smoking 20 years ago (as of 01/2012)   Family History  Problem Relation Age of Onset  . Heart attack Mother       Review of Systems Not exercising "lazy" Not sleeping in bed since wife died gainng weight     Objective:   Physical Exam General:  Well-developed, well-nourished and in no acute distress - obese Eyes:  anicteric. ENT:   Mouth and posterior pharynx free of lesions.  Neck:   supple w/o thyromegaly or mass.  Lungs: Clear to auscultation bilaterally. Heart:  S1S2, no rubs, murmurs, gallops. Abdomen:  obese soft, non-tender, no hepatosplenomegaly, hernia, or mass and BS+.  Lymph:  no cervical or supraclavicular adenopathy. Extremities:   no edema Neuro:  A&O x 3.  Psych:  appropriate mood and  Affect.   Data Reviewed:  PCP notes Lab Results  Component Value Date   WBC 9.5 12/24/2011   HGB 13.9 12/24/2011   HCT 41.2 12/24/2011   MCV 81.6 12/24/2011   PLT  307 12/24/2011  '     Assessment & Plan:  Blood in stool  Chest pain  Diastolic heart failure  1. Cardiology follow-up - suspect chest pain is not cardiac but due for 6 month follow-up 2. If no cardiac diagnostics needed - likely then schedule colonoscopy to evaluate bleeding. The risks and benefits as well as alternatives of endoscopic procedure(s) have been discussed and reviewed. All questions answered. The patient agrees to proceed. 3. Encouraged some exercise and follow-through with sleep study - he could ask if any supervised PT/Rehab programs available to him, he is concerned because had TIA when using an exercise glider   I appreciate the opportunity to care for this patient.  Cc:OH PARK, Marylene Land, MD, Melene Muller, MD

## 2012-05-25 ENCOUNTER — Ambulatory Visit (HOSPITAL_BASED_OUTPATIENT_CLINIC_OR_DEPARTMENT_OTHER): Payer: Medicare Other | Attending: Family Medicine | Admitting: Radiology

## 2012-05-25 VITALS — Ht 67.0 in | Wt 310.0 lb

## 2012-05-25 DIAGNOSIS — I4949 Other premature depolarization: Secondary | ICD-10-CM | POA: Insufficient documentation

## 2012-05-25 DIAGNOSIS — G479 Sleep disorder, unspecified: Secondary | ICD-10-CM

## 2012-05-25 DIAGNOSIS — I491 Atrial premature depolarization: Secondary | ICD-10-CM | POA: Insufficient documentation

## 2012-05-25 DIAGNOSIS — G4733 Obstructive sleep apnea (adult) (pediatric): Secondary | ICD-10-CM | POA: Insufficient documentation

## 2012-05-25 DIAGNOSIS — E669 Obesity, unspecified: Secondary | ICD-10-CM

## 2012-05-25 DIAGNOSIS — Z9989 Dependence on other enabling machines and devices: Secondary | ICD-10-CM

## 2012-05-31 DIAGNOSIS — R0609 Other forms of dyspnea: Secondary | ICD-10-CM

## 2012-05-31 DIAGNOSIS — G4733 Obstructive sleep apnea (adult) (pediatric): Secondary | ICD-10-CM

## 2012-05-31 DIAGNOSIS — R0989 Other specified symptoms and signs involving the circulatory and respiratory systems: Secondary | ICD-10-CM

## 2012-05-31 NOTE — Procedures (Signed)
NAME:  Alex [name], Alex [name]                ACCOUNT NO.:  [id number]  MEDICAL RECORD NO.:  [id number]          PATIENT TYPE:  OUT  LOCATION:  SLEEP CENTER                 FACILITY[facility]  [facility]  PHYSICIAN:  Chaska Hagger D. Maple Hudson, MD, FCCP, FACPDATE OF BIRTH:  Dec 13, 1955  DATE OF STUDY:  05/25/2012                           NOCTURNAL POLYSOMNOGRAM  REFERRING PHYSICIAN:  Everrett Coombe, MD  REFERRING PHYSICIAN:  Everrett Coombe, MD  INDICATION FOR STUDY:  Hypersomnia with sleep apnea.  EPWORTH SLEEPINESS SCORE:  14/24.  BMI 48.6, weight 310 pounds, height 67 inches, neck 19 inches.  MEDICATIONS:  Home medications are charted and reviewed.  SLEEP ARCHITECTURE:  Split study protocol.  During the diagnostic phase, total sleep time 151.5 minutes with sleep efficiency 84.4%.  Stage I was 24.1%, stage II 59%, stage III absent, REM 6.9% of total sleep time. Sleep latency 4 minutes, REM latency 100.5 minutes, awake after sleep onset 42.5 minutes.  Arousal index 47.5.  Bedtime medication:  Zyrtec.  RESPIRATORY DATA:  Split study protocol.  Apnea/hypopnea index (AHI) 72.9 per hour.  A total of 184 events was scored including 73 obstructive apneas, 1 central apnea, 2 mixed apneas, 108 hypopneas. Events were not positional.  REM AHI 85.7 per hour.  CPAP was titrated to 17 CWP, AHI 0 per hour.  He wore a medium ResMed Mirage Quattro full face mask with heated humidifier and an EPR of 1.  OXYGEN DATA:  Before CPAP snoring was moderate to loud with oxygen desaturation to a nadir of 64% on room air.  With CPAP control, snoring was prevented and mean oxygen saturation held 92.9% on room air.  CARDIAC DATA:  Sinus rhythm with PACs and frequent PVCs.  MOVEMENT/PARASOMNIA:  No significant movement disturbance.  No bathroom trips.  IMPRESSION/RECOMMENDATION: 1. Severe obstructive sleep apnea/hypopnea syndrome, AHI 72.9 per hour     with non-positional events.  Moderate to loud snoring with oxygen     desaturation to  a nadir of 64% on room air. 2. Successful CPAP titration to 17 CWP, AHI 0 per hour.  He wore a     medium ResMed Mirage Quattro full-face mask with heated humidifier     and an EPR of 1.     Mihika Surrette D. Maple Hudson, MD, [facility], FACP Diplomate, American Board of Sleep Medicine    CDY/MEDQ  D:  05/31/2012 15:34:31  T:  05/31/2012 22:57:39  Job:  [id number]

## 2012-06-02 ENCOUNTER — Telehealth: Payer: Self-pay | Admitting: Family Medicine

## 2012-06-02 DIAGNOSIS — G4733 Obstructive sleep apnea (adult) (pediatric): Secondary | ICD-10-CM | POA: Insufficient documentation

## 2012-06-02 NOTE — Assessment & Plan Note (Addendum)
Sleep study results received. Rx for CPAP and settings sent to [facility]. Called and notified patient. He said he "slept like a baby" with the CPAP and mask.   

## 2012-06-02 NOTE — Telephone Encounter (Signed)
Sleep study results received. Rx for CPAP and settings sent to [facility]. Called and notified patient. He said he "slept like a baby" with the CPAP and mask.

## 2012-06-02 NOTE — Telephone Encounter (Signed)
Required materials faxed to [facility] [phone]). Alex [name], Alex [name]

## 2012-06-09 ENCOUNTER — Other Ambulatory Visit: Payer: Self-pay | Admitting: Family Medicine

## 2012-06-18 ENCOUNTER — Ambulatory Visit (INDEPENDENT_AMBULATORY_CARE_PROVIDER_SITE_OTHER): Payer: Medicare Other | Admitting: Cardiovascular Disease

## 2012-06-18 ENCOUNTER — Encounter: Payer: Self-pay | Admitting: Cardiovascular Disease

## 2012-06-18 VITALS — BP 139/89 | HR 106 | Ht 68.0 in | Wt 311.8 lb

## 2012-06-18 DIAGNOSIS — I10 Essential (primary) hypertension: Secondary | ICD-10-CM

## 2012-06-18 DIAGNOSIS — I2510 Atherosclerotic heart disease of native coronary artery without angina pectoris: Secondary | ICD-10-CM

## 2012-06-18 DIAGNOSIS — I5032 Chronic diastolic (congestive) heart failure: Secondary | ICD-10-CM

## 2012-06-18 MED ORDER — FUROSEMIDE 40 MG PO TABS: 40.0000 mg | ORAL_TABLET | Freq: Every day | ORAL | Status: DC

## 2012-06-18 NOTE — Progress Notes (Signed)
History of Present Illness: 57 yo male with history of DM, HTN, HLD, mild non-obstructive CAD by cath 2002, asthma, GERD, OSA on CPAP here today for cardiac follow up. He was seen as a new patient 11/06/11. He had been seen by Dr. Jens Som in December 2009 but has not followed up since then. He had a cardiac cath in 2002 which showed 25% stenosis in the distal RCA. He was admitted to [facility] December 2009 with chest pain and ruled out for an MI with serial cardiac enzymes. Echo suggested possible RA/RV mass. He was seen in f/u by Dr. Jens Som in December 2009 and February 2010 with a follow-up echocardiogram, both to requantify his LV function and to rule out the right atrial mass. This was performed on March 31, 2008. He was found to have distal septal hypokinesis, but his ejection fraction is 55%. There was mild-to-moderate LVH. There was no mass noted across the tricuspid valve. The left atrium is mildly dilated. The ascending aorta measured 43 mm. Stress myoview December 2009 with ejection fraction of 48%. There was mild apical thinning, but no ischemia or scar. There was left ventricular enlargement. Cardiac MRI had been planned but f/u echo was normal in regards to RA/RV thickening so was not pursued. At his first visit here on 11/06/11, he told me that he had been having pain in his right arm for 3 weeks as well as pains in his chest several times per week. There was associated SOB and fatigue. I arranged an echo on 11/08/11 which showed normal LV size and function with LVEF of 55-65% and moderate LVH. There were no significant valvular issues. Lexiscan myoview on 11/15/11 with no inducible ischemia. Admitted to [facility] 12/24/11 with TIA type symptoms. Lasix held at d/c from hospital. He has started CPAP and he feels great.   He is here for f/u. Feeling well. No chest pain or SOB. He is out of one medication but he is not sure which one. Occasional swelling in legs. Abdomen seems to be swollen. No dizziness,  near syncope or syncope.   Primary Care Physician: Hyacinth Meeker, CRNP   Last Lipid Profile: managed in primary care  Past Medical History  Diagnosis Date  . HTN (hypertension)   . Diabetes mellitus   . GERD (gastroesophageal reflux disease)   . CAD (coronary artery disease)     Cardiac cath 2002 with 25% distal RCA stenosis.   . Hyperlipidemia   . Asthma   . MI (myocardial infarction) 2009    no stent placement  . Gout   . Obesity   . TIA (transient ischemic attack) 2013  . Esophageal stricture   . OA (osteoarthritis)   . Insomnia   . DDD (degenerative disc disease), lumbar   . Gastritis 2010    Past Surgical History  Procedure Laterality Date  . Vasectomy    . Nasal sinus surgery    . Esophagogastroduodenoscopy      multiple    Current Outpatient Prescriptions  Medication Sig Dispense Refill  . albuterol (PROVENTIL HFA;VENTOLIN HFA) 108 (90 BASE) MCG/ACT inhaler Inhale 2 puffs into the lungs every 6 (six) hours as needed.  1 each  1  . amLODipine (NORVASC) 10 MG tablet Take 1 tablet (10 mg total) by mouth at bedtime.      Marland Kitchen aspirin 81 MG chewable tablet Chew 81 mg by mouth daily.      . cetirizine (ZYRTEC) 10 MG tablet Take 1 tablet (10 mg total) by mouth  daily as needed.  90 tablet  3  . fluticasone (FLONASE) 50 MCG/ACT nasal spray Place 2 sprays into the nose daily.  16 g  6  . lisinopril (PRINIVIL,ZESTRIL) 40 MG tablet Take 1 tablet (40 mg total) by mouth at bedtime.      . metFORMIN (GLUCOPHAGE) 1000 MG tablet Take 1 tablet (1,000 mg total) by mouth 2 (two) times daily with a meal.  180 tablet  3  . metoprolol (LOPRESSOR) 50 MG tablet Take 1 tablet (50 mg total) by mouth 2 (two) times daily.  180 tablet  3  . naproxen (NAPROSYN) 500 MG tablet Take 500 mg by mouth daily.      Marland Kitchen omeprazole (PRILOSEC) 40 MG capsule Take 1 capsule (40 mg total) by mouth daily.  90 capsule  3  . pravastatin (PRAVACHOL) 40 MG tablet TAKE ONE (1) TABLET EACH DAY  30 tablet  2   No  current facility-administered medications for this visit.    Allergies  Allergen Reactions  . Atorvastatin Other (See Comments)    Subjective myalgias. Gave 2 trials, developed myalgias which resolved after cessation of medication.  Muscle cramps were 9/10 discomfort  . Codeine Nausea Only    History   Social History  . Marital Status: Widowed    Spouse Name: N/A    Number of Children: 4  . Years of Education: N/A   Occupational History  . Disability    Social History Main Topics  . Smoking status: Former Smoker -- 1.50 packs/day for 10 years    Types: Cigarettes    Quit date: 11/06/1991  . Smokeless tobacco: Never Used     Comment: quit 1993  . Alcohol Use: No  . Drug Use: No  . Sexually Active: Not on file   Other Topics Concern  . Not on file   Social History Narrative   On disability   [employer]   Quit smoking 20 years ago (as of 01/2012)    Family History  Problem Relation Age of Onset  . Heart attack Mother     Review of Systems:  As stated in the HPI and otherwise negative.   BP 139/89  Pulse 106  Ht 5\' 8"  (1.727 m)  Wt 311 lb 12.8 oz (141.432 kg)  BMI 47.42 kg/m2  Physical Examination: General: Well developed, well nourished, NAD HEENT: OP clear, mucus membranes moist SKIN: warm, dry. No rashes. Neuro: No focal deficits Musculoskeletal: Muscle strength 5/5 all ext Psychiatric: Mood and affect normal Neck: No JVD, no carotid bruits, no thyromegaly, no lymphadenopathy. Lungs:Clear bilaterally, no wheezes, rhonci, crackles Cardiovascular: Regular rate and rhythm. No murmurs, gallops or rubs. Abdomen:Soft. Bowel sounds present. Non-tender.  Extremities: No lower extremity edema. Pulses are 2 + in the bilateral DP/PT.  Echo 11/08/11:  Left ventricle: The cavity size was normal. Wall thickness was increased in a pattern of moderate LVH. Systolic function was normal. The estimated ejection fraction was in the range of 55% to 65%. Wall motion was  normal; there were no regional wall motion abnormalities. Doppler parameters are consistent with abnormal left ventricular relaxation (grade 1 diastolic dysfunction). - Pulmonary arteries: Systolic pressure was mildly increased. PA peak pressure: 39mm Hg (S).  Lexiscan Stress Myoview 11/15/11:  Stress Procedure: The patient received IV Lexiscan 0.4 mg over 15-seconds. Technetium 29m Tetrofosmin injected at 30-seconds. There were no significant changes, sob, and abdominal pain with Lexiscan. Quantitative spect images were obtained after a 45 minute delay.  Stress ECG: No significant change  from baseline ECG  QPS  Raw Data Images: Normal; no motion artifact; normal heart/lung ratio.  Stress Images: Normal homogeneous uptake in all areas of the myocardium.  Rest Images: Normal homogeneous uptake in all areas of the myocardium.  Subtraction (SDS): No evidence of ischemia.  Transient Ischemic Dilatation (Normal <1.22): 1.10  Lung/Heart Ratio (Normal <0.45): 0.44  Quantitative Gated Spect Images  QGS EDV: 121 ml  QGS ESV: 55 ml  Impression  Exercise Capacity: Lexiscan with no exercise.  BP Response: Normal blood pressure response.  Clinical Symptoms: There is dyspnea.  ECG Impression: No significant ST segment change suggestive of ischemia.  Comparison with Prior Nuclear Study: No images to compare  Overall Impression: Normal stress nuclear study.   Assessment and Plan:   1. Chest pain: He is known to have mild CAD by cath in 2002. Stress test September 2013 without ischemia.  Echo September 2013 with moderate LVH. No further ischemic workup at this time.  2. Right atrial/RV mass: Pt evaluated by Dr. Jens Som for abnormality in the RA/RV in 2009. Not there on f/u echo 2010. No evidence of RA/RV mass on repeat echo   3. CAD: Stable, mild disease by cath 2002. No ischemia on stress test September 2013. Continue current meds. He will call back to let us know which medication he needs to have  refilled.   4. Chronic diastolic heart failure: Weight up Will restart Lasix 40 mg po QDaily.   5. HTN: BP controlled.

## 2012-06-18 NOTE — Patient Instructions (Signed)
Your physician wants you to follow-up in:6 months.  You will receive a reminder letter in the mail two months in advance. If you don't receive a letter, please call our office to schedule the follow-up appointment.   Your physician has recommended you make the following change in your medication:  Start furosemide 40 mg by mouth daily     

## 2012-06-19 ENCOUNTER — Telehealth: Payer: Self-pay | Admitting: Cardiovascular Disease

## 2012-06-19 MED ORDER — PRAVASTATIN SODIUM 40 MG PO TABS: 40.0000 mg | ORAL_TABLET | Freq: Every day | ORAL | Status: DC

## 2012-06-19 NOTE — Telephone Encounter (Signed)
New problem   Pt need new prescription for Pravastatin 40mg  or Fenofidrate 54mg  please call pt concerning his medications.

## 2012-06-19 NOTE — Telephone Encounter (Signed)
Fenofibrate was stopped by primary care on December 27,2013. I spoke with pt and confirmed he has been taking pravastatin 40 mg daily. Will send refills for pravastatin to [facility] on [facility] [address] [facility] [address] [facility]

## 2012-06-25 ENCOUNTER — Ambulatory Visit: Payer: Medicare Other | Admitting: Family Medicine

## 2012-07-10 ENCOUNTER — Ambulatory Visit: Payer: Medicare Other | Admitting: Family Medicine

## 2012-07-14 ENCOUNTER — Ambulatory Visit (INDEPENDENT_AMBULATORY_CARE_PROVIDER_SITE_OTHER): Payer: Medicare Other | Admitting: Family Medicine

## 2012-07-14 ENCOUNTER — Encounter: Payer: Self-pay | Admitting: Family Medicine

## 2012-07-14 VITALS — BP 158/95 | HR 66 | Temp 98.2°F | Ht 68.0 in | Wt 318.0 lb

## 2012-07-14 DIAGNOSIS — R079 Chest pain, unspecified: Secondary | ICD-10-CM

## 2012-07-14 DIAGNOSIS — R609 Edema, unspecified: Secondary | ICD-10-CM

## 2012-07-14 DIAGNOSIS — R600 Localized edema: Secondary | ICD-10-CM

## 2012-07-14 DIAGNOSIS — K921 Melena: Secondary | ICD-10-CM

## 2012-07-14 DIAGNOSIS — I10 Essential (primary) hypertension: Secondary | ICD-10-CM

## 2012-07-14 DIAGNOSIS — E119 Type 2 diabetes mellitus without complications: Secondary | ICD-10-CM

## 2012-07-14 DIAGNOSIS — I2510 Atherosclerotic heart disease of native coronary artery without angina pectoris: Secondary | ICD-10-CM

## 2012-07-14 DIAGNOSIS — R319 Hematuria, unspecified: Secondary | ICD-10-CM

## 2012-07-14 LAB — POCT URINALYSIS DIPSTICK
Bilirubin, UA: NEGATIVE
Glucose, UA: NEGATIVE
Ketones, UA: NEGATIVE
Leukocytes, UA: NEGATIVE
Nitrite, UA: NEGATIVE
Protein, UA: NEGATIVE
Spec Grav, UA: <=1.005
Urobilinogen, UA: 0.2
pH, UA: 5.5

## 2012-07-14 LAB — PRO B NATRIURETIC PEPTIDE: Pro B Natriuretic peptide (BNP): 18.97 pg/mL (ref <126)

## 2012-07-14 LAB — POCT UA - MICROSCOPIC ONLY

## 2012-07-14 LAB — BASIC METABOLIC PANEL
BUN: 20 mg/dL (ref 6–23)
CO2: 26 mEq/L (ref 19–32)
Calcium: 9.8 mg/dL (ref 8.4–10.5)
Chloride: 103 mEq/L (ref 96–112)
Creat: 1.28 mg/dL (ref 0.50–1.35)
Glucose, Bld: 98 mg/dL (ref 70–99)
Potassium: 4.3 mEq/L (ref 3.5–5.3)
Sodium: 137 mEq/L (ref 135–145)

## 2012-07-14 LAB — POCT GLYCOSYLATED HEMOGLOBIN (HGB A1C): Hemoglobin A1C: 6.1

## 2012-07-14 MED ORDER — POTASSIUM CHLORIDE ER 20 MEQ PO TBCR: EXTENDED_RELEASE_TABLET | ORAL | Status: DC

## 2012-07-14 NOTE — Assessment & Plan Note (Signed)
>>  ASSESSMENT AND PLAN FOR DIABETES TYPE 2, CONTROLLED WRITTEN ON 07/14/2012 11:32 AM BY OH PARK, ANGELA J, MD  Controlled with better diet and metfomrin.

## 2012-07-14 NOTE — Assessment & Plan Note (Signed)
Unchanged. Cardiologist is aware of chest pain and does not recommend further work-up at this time.\ He was given indications to go to ED

## 2012-07-14 NOTE — Patient Instructions (Signed)
We will give you a list of numbers. Please call and schedule colonoscopy.   Re-start Lasix. Take 1/2 tablet a day.  Every time you take Lasix, take a potassium tablet.   Follow-up in 2 weeks.

## 2012-07-14 NOTE — Assessment & Plan Note (Signed)
Of lower extremities and abdomen. He did not take Lasix prescribed by cardiologist because it made him feel bad. He only took 1 dose.  -Check proBNP today -Check K, Cr -Take lower dose of Lasix so 20 mg once daily and take potassium with it -Follow-up in 2 weeks; consider increasing Lasix and/or stopping naproxen if not improved

## 2012-07-14 NOTE — Assessment & Plan Note (Signed)
Given list of colonoscopy providers and advise to schedule

## 2012-07-14 NOTE — Assessment & Plan Note (Signed)
Controlled with better diet and metfomrin.

## 2012-07-14 NOTE — Addendum Note (Signed)
Addended by: [place], Amilyah Nack on: 07/14/2012 12:28 PM   Modules accepted: Orders

## 2012-07-14 NOTE — Assessment & Plan Note (Signed)
Elevated today. No changes due to addressing other issues and asymptomatic. Follow-up in 2 weeks.

## 2012-07-14 NOTE — Progress Notes (Signed)
  Subjective:    Patient ID: Alex [name], male    DOB: Jul 31, 1955, 57 y.o.   MRN: [id number]  HPI # Follow-up sleep apnea Sleep study end of April 2014 showed OSA and improvement with CPAP He is using CPAP machine every day. "That machine is the bomb".  His daytime sleepiness has resolved.   # Blood in stool He did not get colonoscopy.   # Chest pain He endorses occasional chest pain. It is improved from prior. Cardiologist is aware of chest pain and does not recommend further work-up at this time.  # T2 DM On metformin maximum A1c improvement secondary to metformin, watching what he eats  # HTN 139/89 at cardiologist's clinic 06/18/2012 Home meds: Norvasc 10, Lasix 40, lisinopril 40, metoprolol 50 bid  ROS: denies chest pain, dyspnea  # Diastolic heart failure, lower extremity edema 311 lbs 04/09, 318 today  He is not taking Lasix. He feels bad.  He has been eating more  Chronic knee pain He takes Naproxen 1-2 times a day   Review of Systems Per HPI Denies constipation Reports history of blood in urine 20 years ago; denies dysuria/urgency/frequency, chills, fevers  Allergies, medication, past medical history reviewed.  Smoking status noted.     Objective:   Physical Exam GEN: NAD; obese CV: RRR, no murmurs PULM: NI WOB; CTAB without w/r/r EXT: 1+ pitting pretibial edema  ABD: soft, NT, markedly distended     Assessment & Plan:

## 2012-07-15 ENCOUNTER — Encounter: Payer: Self-pay | Admitting: Internal Medicine

## 2012-07-30 ENCOUNTER — Ambulatory Visit (INDEPENDENT_AMBULATORY_CARE_PROVIDER_SITE_OTHER): Payer: Medicare Other | Admitting: Family Medicine

## 2012-07-30 VITALS — BP 132/86 | HR 72 | Temp 98.2°F | Ht 68.0 in | Wt 317.0 lb

## 2012-07-30 DIAGNOSIS — Z8673 Personal history of transient ischemic attack (TIA), and cerebral infarction without residual deficits: Secondary | ICD-10-CM

## 2012-07-30 DIAGNOSIS — R609 Edema, unspecified: Secondary | ICD-10-CM

## 2012-07-30 DIAGNOSIS — I10 Essential (primary) hypertension: Secondary | ICD-10-CM

## 2012-07-30 DIAGNOSIS — K921 Melena: Secondary | ICD-10-CM

## 2012-07-30 DIAGNOSIS — E119 Type 2 diabetes mellitus without complications: Secondary | ICD-10-CM

## 2012-07-30 LAB — POCT GLYCOSYLATED HEMOGLOBIN (HGB A1C): Hemoglobin A1C: 6.4

## 2012-07-30 MED ORDER — PRAVASTATIN SODIUM 40 MG PO TABS: 40.0000 mg | ORAL_TABLET | Freq: Every day | ORAL | Status: DC

## 2012-07-30 NOTE — Patient Instructions (Addendum)
Please make sure you are taking pravastatin (cholesterol medication)  Please take metformin 2 tablets twice a day (for diabetes) so 1000 mg twice a day  If swelling in your legs worsens and you have difficulty breathing, take a dose of Lasxi, then follow-up with [place] or your cardiologist.   Follow-up in 3 months (August).

## 2012-07-30 NOTE — Assessment & Plan Note (Addendum)
He has scheduled colonoscopy.

## 2012-07-30 NOTE — Assessment & Plan Note (Signed)
Unchanged. He does not tolerate Lasix (makes him feel "bad").  Hold for now. Not significant edema and no respiratory symptoms.  Follow-up as needed.

## 2012-07-30 NOTE — Progress Notes (Signed)
  Subjective:    Patient ID: Alex [name], male    DOB: 1955/07/03, 57 y.o.   MRN: [id number]  HPI # Preventative, blood in stool Scheduled colonoscopy? Yes, he has scheduled visit   # Hypertension Compliant with medications ROS: denies chest pain  # Lower extremity edema, history of MI, grade 1 diastolic dysfunction with EF 55-65 (10/2011) Started on 20 mg Lasix daily at last visit 05/05.  Taking? Inconsistently. He started feeling bad again like the time he had his mini-stroke.  Still taking naproxen? Yes; once a day for knee pain.   Review of Systems Per HPI Denies dyspnea, nausea, constipation  Allergies, medication, past medical history reviewed.  Smoking status noted.     Objective:   Physical Exam GEN: NAD; obese  CV: RRR, no murmurs  PULM: NI WOB; CTAB without w/r/r  EXT: 1+ pitting pretibial edema  ABD: soft, NT, obese    Assessment & Plan:

## 2012-07-30 NOTE — Assessment & Plan Note (Signed)
Controlled.  Continue current medications.

## 2012-08-06 ENCOUNTER — Telehealth: Payer: Self-pay | Admitting: Family Medicine

## 2012-08-06 NOTE — Telephone Encounter (Signed)
Pt is wanting to let Dr Madolyn Frieze know that he thinks he is taking too much metformin.  He wants to stay on 500mg  twice a day.  Keeps his sugars between 100 & 125.  pls advise

## 2012-08-06 NOTE — Telephone Encounter (Signed)
Will fwd to MD.  Corinna Burkman L, CMA  

## 2012-08-06 NOTE — Telephone Encounter (Signed)
LM for patient with instructions below.  Alex [name], Alex [name], CMA

## 2012-08-06 NOTE — Telephone Encounter (Signed)
Metformin will not drop his blood sugars 1000 bid is safe for him to take. Unless it causes nausea. Then he may drop back down to previous dose.   This is a very very good medicine for diabetes management.

## 2012-08-26 ENCOUNTER — Ambulatory Visit (AMBULATORY_SURGERY_CENTER): Payer: Medicare Other | Admitting: *Deleted

## 2012-08-26 VITALS — Ht 68.0 in | Wt 318.2 lb

## 2012-08-26 DIAGNOSIS — Z1211 Encounter for screening for malignant neoplasm of colon: Secondary | ICD-10-CM

## 2012-08-26 MED ORDER — NA SULFATE-K SULFATE-MG SULF 17.5-3.13-1.6 GM/177ML PO SOLN: 1.0000 | Freq: Once | ORAL | Status: DC

## 2012-08-26 NOTE — Progress Notes (Signed)
Denies allergies to eggs or soy products. Denies complications with sedation or anesthesia. 

## 2012-09-02 ENCOUNTER — Encounter: Payer: Self-pay | Admitting: Family Medicine

## 2012-09-02 ENCOUNTER — Telehealth: Payer: Self-pay | Admitting: Family Medicine

## 2012-09-02 ENCOUNTER — Ambulatory Visit (INDEPENDENT_AMBULATORY_CARE_PROVIDER_SITE_OTHER): Payer: Medicare Other | Admitting: Family Medicine

## 2012-09-02 VITALS — BP 149/87 | HR 69 | Temp 97.6°F | Wt 317.0 lb

## 2012-09-02 DIAGNOSIS — T148 Other injury of unspecified body region: Secondary | ICD-10-CM

## 2012-09-02 DIAGNOSIS — I10 Essential (primary) hypertension: Secondary | ICD-10-CM

## 2012-09-02 DIAGNOSIS — W57XXXA Bitten or stung by nonvenomous insect and other nonvenomous arthropods, initial encounter: Secondary | ICD-10-CM

## 2012-09-02 NOTE — Progress Notes (Signed)
Patient ID: Alex [name], male   DOB: 08/30/1955, 57 y.o.   MRN: [id number]  [name] Alex Fackrell M. Tamari Redwine, MD Phone: [phone]   Subjective: HPI: Patient is a 57 y.o. male presenting to clinic today for same day appointment for elevated blood pressure. States he checked it at home with arm cuff. This morning his BP was 159/100 but it has been elevated for last 2 days. Associated with dizziness and nausea, intermittent. States he has some very mild chest pain, which is not new for him. No severe pain or persistent pain. States he "just feels awful." Missed his medicine Thursday night so he took it Friday morning and got off schedule, but now back on schedule without missed doses. On Metoprolol 50mg  BID, Amlodipine 10mg  and Lisinopril 40mg  now. Of note, he does take Naprosyn daily for arthritis pain.  Also concerned about bites on lower extremities. Itching but not spreading or painful.   History Reviewed: Former smoker.  ROS: Please see HPI above.  Objective: Office vital signs reviewed. BP 149/87  Pulse 69  Temp(Src) 97.6 F (36.4 C) (Oral)  Wt 317 lb (143.79 kg)  BMI 48.21 kg/m2  Physical Examination:  General: Awake, alert. NAD. Obese. HEENT: Atraumatic, normocephalic. MMM Pulm: CTAB, no wheezes Cardio: RRR, no murmurs appreciated Abdomen: obese, soft, nontender, nondistended Extremities: Trace edema Skin: Multiple small erythematous papules on lower extremities Neuro: Grossly intact  Assessment: 57 y.o. male with elevated blood pressure  Plan: See Problem List and After Visit Summary

## 2012-09-02 NOTE — Patient Instructions (Signed)
It was good to see you today. I am sorry your blood pressure has been high. I think it would be a good idea to take a break from the Naproxen, for at least one week. You can take Tylenol as needed. If your blood pressure stays high, call us and I will send in a new prescription for HCTZ to take once daily.  Watch your salt intake and your diet.  We will see you back in August.  Mackey Varricchio M. Zakariye Nee, M.D.

## 2012-09-02 NOTE — Telephone Encounter (Signed)
LMOVM for pt to return call and schedule appt for this afternoon (attempted both number).  Advised to make appt this afternoon.  If he is having chest pain (pressure), SOB, Nausea and cant get in this afterrnoon may need to go to ED. Lutie Pickler, Maryjo Rochester

## 2012-09-02 NOTE — Telephone Encounter (Signed)
Pt states his blood pressure is up. Bottom number is around 100.  It has been up 2-3 days. He is also feeling nauseated Please advise.

## 2012-09-02 NOTE — Assessment & Plan Note (Signed)
BP mildly elevated but not severe range today. Continue current regimen for now, but can consider adding HCTZ if needed. Also, patient on daily NSAID will d/c those today for at least a week, but perhaps longer. Return in 1-2 weeks for recheck or sooner if needed.

## 2012-09-02 NOTE — Assessment & Plan Note (Signed)
Multiple bug bites on legs. Use OTC hydrocortisone cream on legs for itching

## 2012-09-09 ENCOUNTER — Ambulatory Visit (AMBULATORY_SURGERY_CENTER): Payer: Medicare Other | Admitting: Internal Medicine

## 2012-09-09 ENCOUNTER — Other Ambulatory Visit: Payer: Self-pay | Admitting: Family Medicine

## 2012-09-09 ENCOUNTER — Encounter: Payer: Self-pay | Admitting: Internal Medicine

## 2012-09-09 VITALS — BP 150/92 | HR 70 | Temp 97.8°F | Resp 16 | Ht 68.0 in | Wt 318.2 lb

## 2012-09-09 DIAGNOSIS — K648 Other hemorrhoids: Secondary | ICD-10-CM

## 2012-09-09 DIAGNOSIS — D126 Benign neoplasm of colon, unspecified: Secondary | ICD-10-CM

## 2012-09-09 DIAGNOSIS — K5730 Diverticulosis of large intestine without perforation or abscess without bleeding: Secondary | ICD-10-CM

## 2012-09-09 DIAGNOSIS — K921 Melena: Secondary | ICD-10-CM

## 2012-09-09 DIAGNOSIS — K644 Residual hemorrhoidal skin tags: Secondary | ICD-10-CM

## 2012-09-09 LAB — GLUCOSE, CAPILLARY
Glucose-Capillary: 98 mg/dL (ref 70–99)
Glucose-Capillary: 92 mg/dL (ref 70–99)

## 2012-09-09 MED ORDER — SODIUM CHLORIDE 0.9 % IV SOLN: 500.0000 mL | INTRAVENOUS | Status: DC

## 2012-09-09 NOTE — Progress Notes (Signed)
Patient did not experience any of the following events: a burn prior to discharge; a fall within the facility; wrong site/side/patient/procedure/implant event; or a hospital transfer or hospital admission upon discharge from the facility. (G8907)Patient did not have preoperative order for IV antibiotic SSI prophylaxis. (G8918) ewm 

## 2012-09-09 NOTE — Progress Notes (Signed)
Lidocaine-40mg IV prior to Propofol InductionPropofol given over incremental dosages 

## 2012-09-09 NOTE — Patient Instructions (Addendum)
The bleeding you had came from hemorrhoids. These are treatable with a painless office procedure - you can schedule a visit to find out more about that.  I did find and remove 2 small polyps - which look benign. You also have diverticulosis - a common condition that is not usually a problem.  I will let you know pathology results and when to have another routine colonoscopy by mail.  I appreciate the opportunity to care for you. Iva Boop, MD, FACG   YOU HAD AN ENDOSCOPIC PROCEDURE TODAY AT THE [facility]: Refer to the procedure report that was given to you for any specific questions about what was found during the examination.  If the procedure report does not answer your questions, please call your gastroenterologist to clarify.  If you requested that your care partner not be given the details of your procedure findings, then the procedure report has been included in a sealed envelope for you to review at your convenience later.  YOU SHOULD EXPECT: Some feelings of bloating in the abdomen. Passage of more gas than usual.  Walking can help get rid of the air that was put into your GI tract during the procedure and reduce the bloating. If you had a lower endoscopy (such as a colonoscopy or flexible sigmoidoscopy) you may notice spotting of blood in your stool or on the toilet paper. If you underwent a bowel prep for your procedure, then you may not have a normal bowel movement for a few days.  DIET: Your first meal following the procedure should be a light meal and then it is ok to progress to your normal diet.  A half-sandwich or bowl of soup is an example of a good first meal.  Heavy or fried foods are harder to digest and may make you feel nauseous or bloated.  Likewise meals heavy in dairy and vegetables can cause extra gas to form and this can also increase the bloating.  Drink plenty of fluids but you should avoid alcoholic beverages for 24 hours.  ACTIVITY: Your care  partner should take you home directly after the procedure.  You should plan to take it easy, moving slowly for the rest of the day.  You can resume normal activity the day after the procedure however you should NOT DRIVE or use heavy machinery for 24 hours (because of the sedation medicines used during the test).    SYMPTOMS TO REPORT IMMEDIATELY: A gastroenterologist can be reached at any hour.  During normal business hours, 8:30 AM to 5:00 PM Monday through Friday, call [phone].  After hours and on weekends, please call the GI answering service at [phone] emergency number who will take a message and have the physician on call contact you.   Following lower endoscopy (colonoscopy or flexible sigmoidoscopy):  Excessive amounts of blood in the stool  Significant tenderness or worsening of abdominal pains  Swelling of the abdomen that is new, acute  Fever of 100F or higher FOLLOW UP: If any biopsies were taken you will be contacted by phone or by letter within the next 1-3 weeks.  Call your gastroenterologist if you have not heard about the biopsies in 3 weeks.  Our staff will call the home number listed on your records the next business day following your procedure to check on you and address any questions or concerns that you may have at that time regarding the information given to you following your procedure. This is a courtesy call  and so if there is no answer at the home number and we have not heard from you through the emergency physician on call, we will assume that you have returned to your regular daily activities without incident.  SIGNATURES/CONFIDENTIALITY: You and/or your care partner have signed paperwork which will be entered into your electronic medical record.  These signatures attest to the fact that that the information above on your After Visit Summary has been reviewed and is understood.  Full responsibility of the confidentiality of this discharge information lies  with you and/or your care-partner.  Handouts on polyps diverticulosis hemorrhoids  Call dr Marvell Fuller office if you decide to have the hemorrhoids addressed in the office with Dr Leone Payor.

## 2012-09-09 NOTE — Op Note (Signed)
[facility] [address] [place], [address]   COLONOSCOPY PROCEDURE REPORT  PATIENT: Alex [name], Alex [name]  MR#: [id number] BIRTHDATE: [birth year] , 57  yrs. old GENDER: Male ENDOSCOPIST: Iva Boop, MD, [facility] PROCEDURE DATE:  09/09/2012 PROCEDURE:   Colonoscopy with biopsy ASA CLASS:   Class III INDICATIONS:blood in stool.   first colonoscopy MEDICATIONS: propofol (Diprivan) 150mg  IV, MAC sedation, administered by CRNA, and These medications were titrated to patient response per physician's verbal order  DESCRIPTION OF PROCEDURE:   After the risks benefits and alternatives of the procedure were thoroughly explained, informed consent was obtained.  A digital rectal exam revealed no rectal mass.   The LB [id number] and LB [id number]  endoscope was introduced through the anus and advanced to the cecum, which was identified by both the appendix and ileocecal valve. No adverse events experienced.   The quality of the prep was excellent using Suprep  The instrument was then slowly withdrawn as the colon was fully examined.   COLON FINDINGS: Two diminutive polyps were found in the distal descending colon and sigmoid colon.  A polypectomy was performed with cold forceps.  The resection was complete and the polyp tissue was completely retrieved.   Severe diverticulosis was noted in the descending colon and sigmoid colon.   The colon mucosa was otherwise normal.   A right colon retroflexion was performed. Retroflexed rectal views revealed internal/external hemorrhoids. The time to cecum=3 minutes 20 seconds.  Withdrawal time=10 minutes 38 seconds.  The scope was withdrawn and the procedure completed. COMPLICATIONS: There were no complications.  ENDOSCOPIC IMPRESSION: 1.   Two diminutive polyps were found in the distal descending colon and sigmoid colon; polypectomy was performed with cold forceps 2.   Severe diverticulosis was noted in the descending colon  and sigmoid colon 3.   The colon mucosa was otherwise normal - excellent prep 4.   Internal hemorrhoids 5.   External hemorrhoids  RECOMMENDATIONS: 1.  Timing of repeat colonoscopy will be determined by pathology findings. 2.   Call office to make appointment to discuss hemorrhoid treatment (consider ligation)   eSigned:  Iva Boop, MD, [facility] 09/09/2012 11:08 AM  cc: The Patient and Marikay Alar, MD

## 2012-09-09 NOTE — Progress Notes (Signed)
Called to room to assist during endoscopic procedure.  Patient ID and intended procedure confirmed with present staff. Received instructions for my participation in the procedure from the performing physician.  

## 2012-09-10 ENCOUNTER — Telehealth: Payer: Self-pay | Admitting: *Deleted

## 2012-09-10 NOTE — Telephone Encounter (Signed)
Patient should have refills already to get him through December of this year. Please advise patient. Thanks.

## 2012-09-10 NOTE — Telephone Encounter (Signed)
  Follow up Call-  Call back number 09/09/2012  Post procedure Call Back phone  # [phone]  Permission to leave phone message Yes     Patient questions:  Do you have a fever, pain , or abdominal swelling? no Pain Score  0 *  Have you tolerated food without any problems? yes  Have you been able to return to your normal activities? no  Do you have any questions about your discharge instructions: Diet   no Medications  no Follow up visit  no  Do you have questions or concerns about your Care? no  Actions: * If pain score is 4 or above: No action needed, pain <4.

## 2012-09-17 ENCOUNTER — Encounter: Payer: Self-pay | Admitting: Internal Medicine

## 2012-09-17 NOTE — Progress Notes (Signed)
Quick Note:  Left-side diminutive hyperplastic polyps = revert to routine 10 yr colonoscopy ______

## 2012-10-02 ENCOUNTER — Telehealth: Payer: Self-pay | Admitting: Family Medicine

## 2012-10-02 NOTE — Telephone Encounter (Signed)
Appt made for tomorrow. Alex [name], Alex [name]

## 2012-10-02 NOTE — Telephone Encounter (Signed)
Pt is requesting a medication called in to his pharmacy for poison oak. He has been using calamine lotion and it has not help. It is on his arms and now his stomach. JW

## 2012-10-03 ENCOUNTER — Encounter: Payer: Self-pay | Admitting: Family Medicine

## 2012-10-03 ENCOUNTER — Ambulatory Visit (INDEPENDENT_AMBULATORY_CARE_PROVIDER_SITE_OTHER): Payer: Medicare Other | Admitting: Family Medicine

## 2012-10-03 VITALS — BP 129/81 | HR 77 | Temp 98.0°F | Ht 68.0 in | Wt 307.0 lb

## 2012-10-03 DIAGNOSIS — L0291 Cutaneous abscess, unspecified: Secondary | ICD-10-CM

## 2012-10-03 DIAGNOSIS — L0390 Cellulitis, unspecified: Secondary | ICD-10-CM

## 2012-10-03 MED ORDER — CEPHALEXIN 500 MG PO CAPS: 500.0000 mg | ORAL_CAPSULE | Freq: Four times a day (QID) | ORAL | Status: DC

## 2012-10-03 NOTE — Assessment & Plan Note (Signed)
Apparent cellulitis by appearance and history. Notably patient is a diabetic though is well controlled. Plan: will treat with keflex 500 mg q6 hours. Lesion outlined. Patient to return early next week for f/u to ensure improvement. Given red flags for return outlined in the AVS.

## 2012-10-03 NOTE — Progress Notes (Signed)
  Subjective:    Patient ID: Alex [name], male    DOB: 06/14/1955, 57 y.o.   MRN: [id number]  HPI Patient is a 57 yo male who presents with rash in his right antecubital fossa.  States started about a week ago following a questionable spider vs bug bit vs scratch. Redness spread from initial site of injury. Denies pain in the area. Denies fever, chills, and vomiting. Endorses pruritis. Has used calamine lotion on it as he felt it was due to poison oak and this did not help. Also taken "allergy pills" without relief.    Review of Systems see HPI     Objective:   Physical Exam  Constitutional: He appears well-developed and well-nourished.  HENT:  Head: Normocephalic and atraumatic.  Skin:  Right antecubital fossa: 12x12 cm area of erythema with small area of excoriation at left lower edge, feels warm, mild induration in the lower portion of the rash, no apparent fluctuance  BP 129/81  Pulse 77  Temp(Src) 98 F (36.7 C) (Oral)  Ht 5\' 8"  (1.727 m)  Wt 307 lb (139.254 kg)  BMI 46.69 kg/m2    Assessment & Plan:

## 2012-10-03 NOTE — Patient Instructions (Signed)
Nice to meet you.  Given the injury or bite you had in the area of redness I think you may have a skin infection. Please take the antibiotics I have prescribed you. You need to take these every 6 hours. We will see you back early next week to see if this is improving.  Cellulitis Cellulitis is an infection of the skin and the tissue under the skin. The infected area is usually red and tender. This happens most often in the arms and lower legs. HOME CARE   Take your antibiotic medicine as told. Finish the medicine even if you start to feel better.  Keep the infected arm or leg raised (elevated).  Put a warm cloth on the area up to 4 times per day.  Only take medicines as told by your doctor.  Keep all doctor visits as told. GET HELP RIGHT AWAY IF:   You have a fever.  You feel very sleepy.  You throw up (vomit) or have watery poop (diarrhea).  You feel sick and have muscle aches and pains.  You see red streaks on the skin coming from the infected area.  Your red area gets bigger or turns a dark color.  Your bone or joint under the infected area is painful after the skin heals.  Your infection comes back in the same area or different area.  You have a puffy (swollen) bump in the infected area.  You have new symptoms. MAKE SURE YOU:   Understand these instructions.  Will watch your condition.  Will get help right away if you are not doing well or get worse. Document Released: 08/15/2007 Document Revised: 08/28/2011 Document Reviewed: 05/14/2011 [facility] Patient Information 2014 [place], [place].

## 2012-10-07 ENCOUNTER — Ambulatory Visit (INDEPENDENT_AMBULATORY_CARE_PROVIDER_SITE_OTHER): Payer: Medicare Other | Admitting: Family Medicine

## 2012-10-07 ENCOUNTER — Encounter: Payer: Self-pay | Admitting: Family Medicine

## 2012-10-07 VITALS — BP 131/88 | HR 67 | Temp 97.9°F | Ht 68.0 in | Wt 310.0 lb

## 2012-10-07 DIAGNOSIS — R21 Rash and other nonspecific skin eruption: Secondary | ICD-10-CM | POA: Insufficient documentation

## 2012-10-07 MED ORDER — TRIAMCINOLONE ACETONIDE 0.5 % EX OINT: TOPICAL_OINTMENT | Freq: Two times a day (BID) | CUTANEOUS | Status: DC

## 2012-10-07 NOTE — Assessment & Plan Note (Signed)
Initially felt this may be related to a cellulitis, though no decrease in size with keflex and no expansion of lesion for the last 1.5 weeks. Suspect this may be related to an allergic reaction vs eczematoid reaction. Plan: will try steroid ointment. Stop antibiotic for now. Patient given red flags for return in AVS. Will see in f/u in one week.

## 2012-10-07 NOTE — Patient Instructions (Addendum)
Nice to see you again. The rash does not appear to have responded to the antibiotics and does not appear to be worse, so I do not think it is related to an infection. You can stop the antibiotics at this time, but please hold on to them. Given the itchiness this may be related to an allergic reaction or eczema type reaction. We will try a steroid cream for this. If the redness of the rash starts to spread, you develop fevers, chills, nausea, vomiting, or feel sick please come back to see [place]. I will see you back in 1 week unless this gets better with the steroid cream.

## 2012-10-07 NOTE — Progress Notes (Signed)
  Subjective:    Patient ID: Alex [name], male    DOB: 1955/12/09, 57 y.o.   MRN: [id number]  HPI Patient presents for f/u of right antecubital fossa rash.  Has been about the same since last visit. Not getting bigger or smaller. Is more itchy now. Has been taking the antibiotics.   Rash started out following a small cut or bite, patient was unsure of which. Initially was localized to a small area on the medial portion of the arm just distal to the antecubital fossa. States that night he slept with his arm folded and the next morning noted that the same area but on the proximal aspect of arm had the rash. The area between the two spots of rash was normal skin. The area subsequently got bigger over the next day and has been the same size for the past week and a half.  Review of Systems Denies fevers, chills, nausea, and vomiting.     Objective:   Physical Exam  Constitutional: He appears well-developed and well-nourished.  HENT:  Head: Normocephalic and atraumatic.  Skin:  Right antecubital fossa: about 12x12 cm area of erythema with now apparent scaling on the medial aspect, appears dry, erythema located around the edges, no induration, tenderness, or fluctuance  BP 131/88  Pulse 67  Temp(Src) 97.9 F (36.6 C) (Oral)  Ht 5\' 8"  (1.727 m)  Wt 310 lb (140.615 kg)  BMI 47.15 kg/m2    Assessment & Plan:

## 2012-10-15 ENCOUNTER — Encounter: Payer: Self-pay | Admitting: Family Medicine

## 2012-10-15 ENCOUNTER — Ambulatory Visit (INDEPENDENT_AMBULATORY_CARE_PROVIDER_SITE_OTHER): Payer: Medicare Other | Admitting: Family Medicine

## 2012-10-15 VITALS — BP 148/90 | HR 71 | Temp 98.2°F | Wt 306.0 lb

## 2012-10-15 DIAGNOSIS — R21 Rash and other nonspecific skin eruption: Secondary | ICD-10-CM

## 2012-10-15 DIAGNOSIS — I10 Essential (primary) hypertension: Secondary | ICD-10-CM

## 2012-10-15 DIAGNOSIS — E119 Type 2 diabetes mellitus without complications: Secondary | ICD-10-CM

## 2012-10-15 MED ORDER — METFORMIN HCL 1000 MG PO TABS: 1000.0000 mg | ORAL_TABLET | Freq: Two times a day (BID) | ORAL | Status: DC

## 2012-10-15 NOTE — Patient Instructions (Signed)
Nice to see you again. I'm glad your arm has gotten better. I have refilled your metformin. We will have you come back in 1 month to follow-up on your high blood pressure.

## 2012-10-16 NOTE — Assessment & Plan Note (Signed)
Appears to be doing well. Due for A1c in about 3 weeks. Continue current regimen.

## 2012-10-16 NOTE — Progress Notes (Signed)
  Subjective:    Patient ID: Alex [name], male    DOB: 12-22-1955, 57 y.o.   MRN: [id number]  HPI Patient is a 57 yo male who presents in f/u of skin rash and diabetes.  DIABETES Disease Monitoring: Blood Sugar ranges-130 avg      Visual problems- no, saw optho 2-3 months ago Sensory issues: no Medications: metformin Compliance- taking  Walks every other day. Working on diet.  HYPERTENSION Disease Monitoring Home BP Monitoring no Chest pain- no    Dyspnea- no Medicationslisinopril, amlodipine, meotprolol Compliance-  taking. Edema- no  Rash improved. No erythema at this time. No more itching.  Review of Systems see HPI     Objective:   Physical Exam  Constitutional: He appears well-developed and well-nourished.  HENT:  Head: Normocephalic and atraumatic.  Cardiovascular: Normal rate, regular rhythm and normal heart sounds.   Pulmonary/Chest: Effort normal and breath sounds normal.  Neurological:  Sensation to light touch intact in feet  Skin:  No lesions on feet Rash on arm improved, no erythema or scaling, slightly hyperpigmented area  BP 148/90  Pulse 71  Temp(Src) 98.2 F (36.8 C) (Oral)  Wt 306 lb (138.801 kg)  BMI 46.54 kg/m2     Assessment & Plan:

## 2012-10-16 NOTE — Assessment & Plan Note (Signed)
Improved

## 2012-10-16 NOTE — Assessment & Plan Note (Signed)
>>  ASSESSMENT AND PLAN FOR DIABETES TYPE 2, CONTROLLED WRITTEN ON 10/16/2012 11:46 AM BY SONNENBERG, ERIC G, MD  Appears to be doing well. Due for A1c in about 3 weeks. Continue current regimen.

## 2012-10-16 NOTE — Assessment & Plan Note (Addendum)
Slightly elevated at this visit. Has been at goal previous 2 visits. Will continue current regimen. F/u in one month. If elevated at that visit will adjust medications.

## 2012-11-18 ENCOUNTER — Encounter: Payer: Self-pay | Admitting: Family Medicine

## 2012-11-18 ENCOUNTER — Ambulatory Visit (HOSPITAL_COMMUNITY)
Admission: RE | Admit: 2012-11-18 | Discharge: 2012-11-18 | Disposition: A | Discharge status: Home / Self Care | Payer: Medicare Other | Source: Ambulatory Visit | Attending: Family Medicine | Admitting: Family Medicine

## 2012-11-18 ENCOUNTER — Ambulatory Visit (INDEPENDENT_AMBULATORY_CARE_PROVIDER_SITE_OTHER): Payer: Medicare Other | Admitting: Family Medicine

## 2012-11-18 ENCOUNTER — Telehealth: Payer: Self-pay | Admitting: Family Medicine

## 2012-11-18 VITALS — BP 134/95 | HR 96 | Temp 98.2°F | Wt 308.0 lb

## 2012-11-18 DIAGNOSIS — I10 Essential (primary) hypertension: Secondary | ICD-10-CM

## 2012-11-18 DIAGNOSIS — R42 Dizziness and giddiness: Secondary | ICD-10-CM

## 2012-11-18 DIAGNOSIS — R079 Chest pain, unspecified: Secondary | ICD-10-CM | POA: Insufficient documentation

## 2012-11-18 DIAGNOSIS — E119 Type 2 diabetes mellitus without complications: Secondary | ICD-10-CM

## 2012-11-18 LAB — POCT GLYCOSYLATED HEMOGLOBIN (HGB A1C): Hemoglobin A1C: 6.1

## 2012-11-18 NOTE — Patient Instructions (Signed)
Nice to see you again. Based on the studies we have done today your light headedness is not emergent. Please try checking your blood sugar if you have another episode of this. If you continue to have these episodes please let us know. I believe the episodes of chest pain you are having is likely related to your muscles.   Chest Pain (Nonspecific) Chest pain has many causes. Your pain could be caused by something serious, such as a heart attack or a blood clot in the lungs. It could also be caused by something less serious, such as a chest bruise or a virus. Follow up with your doctor. More lab tests or other studies may be needed to find the cause of your pain. Most of the time, nonspecific chest pain will improve within 2 to 3 days of rest and mild pain medicine. HOME CARE  For chest bruises, you may put ice on the sore area for 15-20 minutes, 3-4 times a day. Do this only if it makes you or your child feel better.  Put ice in a plastic bag.  Place a towel between the skin and the bag.  Rest for the next 2 to 3 days.  Go back to work if the pain improves.  See your doctor if the pain lasts longer than 1 to 2 weeks.  Only take medicine as told by your doctor.  Quit smoking if you smoke. GET HELP RIGHT AWAY IF:   There is more pain or pain that spreads to the arm, neck, jaw, back, or belly (abdomen).  You or your child has shortness of breath.  You or your child coughs more than usual or coughs up blood.  You or your child has very bad back or belly pain, feels sick to his or her stomach (nauseous), or throws up (vomits).  You or your child has very bad weakness.  You or your child passes out (faints).  You or your child has a temperature by mouth above 102 F (38.9 C), not controlled by medicine. Any of these problems may be serious and may be an emergency. Do not wait to see if the problems will go away. Get medical help right away. Call your local emergency services 911 in  [place] Do not drive yourself to the hospital. MAKE SURE YOU:   Understand these instructions.  Will watch this condition.  Will get help right away if you or your child is not doing well or gets worse. Document Released: 08/15/2007 Document Revised: 05/21/2011 Document Reviewed: 08/15/2007 [facility] Patient Information 2014 [place], [place].

## 2012-11-18 NOTE — Telephone Encounter (Signed)
Will forward to Dr Birdie Sons

## 2012-11-18 NOTE — Telephone Encounter (Signed)
Pt saw dr Serena Croissant today. His pharmacy says he needs a new prescription for his test rsip. Please advise

## 2012-11-19 DIAGNOSIS — R42 Dizziness and giddiness: Secondary | ICD-10-CM | POA: Insufficient documentation

## 2012-11-19 NOTE — Assessment & Plan Note (Signed)
A1c 6.1. Will continue current medication regimen.

## 2012-11-19 NOTE — Assessment & Plan Note (Addendum)
Patient with several episodes of light headedness/presyncope recently. Must consider orthostatic hypotension, arrhythmia, PE, MI, TIA. PE seems unlikely as patient does not have current calf pain, tenderness, shortness of breath, recent long travel. MI seems unlikely given description of chest pain that sounds atypical andstress test last year without ischemia. TIA seems unlikely given normal neuro exam at this time. Intermittent arrhythmia can not be ruled out, but EKG in the office was NSR. Orthostatic were negative, though given that a couple of occasions the light headedness occurred upon standing I would not be surprised if there was some measure of orthostatic hypotension effecting this. Will continue to monitor.

## 2012-11-19 NOTE — Assessment & Plan Note (Signed)
>>  ASSESSMENT AND PLAN FOR DIABETES TYPE 2, CONTROLLED WRITTEN ON 11/19/2012  4:18 PM BY SONNENBERG, ERIC G, MD  A1c 6.1. Will continue current medication regimen.

## 2012-11-19 NOTE — Assessment & Plan Note (Signed)
Suspect current episodes may be related to MSK cause. Patient given red flags for return to care. May benefit from seeing his cardiologist if these continue.

## 2012-11-19 NOTE — Progress Notes (Signed)
Patient ID: Alex [name], male   DOB: [birth year], 57 y.o.   MRN: [id number] Alex [name] is a 57 y.o. male who presents today for for evaluation of light headedness and off and on chest pain.  Light headedness: has occurred 3-4x over the past couple of weeks. On a couple of the occasions felt as though vision was going black and was going to pass out upon standing up. On the other occasions this occurred while sitting down. States lasted for about one minute. The room was not spinning. Resolved on own, though noted drank some water. Never actually passed out. Notes occasional palpitation, though notes not associated with light headedness or chest pain. Does have some question of mild shortness of breath with palpitations. States normal vision and no ear fullness.  Chest pain: off and on for some time. Occurs over left lower ribs and in epigastric region. Lasts for about a minute. Can occur when sitting down or up walking around. Not brought on by physical activity. Describes as achey pain. No radiation, shortness of breath, or diaphoresis with this. I review of previous notes, has seen cardiologist in April and has history of mild CAD on cath in 2002. Stress test in 9/13 with no ischemia, and echo with moderate LVH. Per the cardiologist last note they are not planning further ischemic workup at this time.  DIABETES Disease Monitoring: checking, not every day Blood Sugar ranges-in the low 100's      Visual problems- no Medications: metformin Compliance- taking Hypoglycemic symptoms- no  HYPERTENSION Disease Monitoringnot checking Home BP Monitoring not checking Chest pain- see above    Dyspnea- no Medications: lisinopril, norvasc, metoprolol Compliance-  taking. Lightheadedness-  See above  Edema- no    Past Medical History  Diagnosis Date  . HTN (hypertension)   . Diabetes mellitus   . GERD (gastroesophageal reflux disease)   . CAD (coronary artery disease)     Cardiac cath 2002 with 25%  distal RCA stenosis.   . Hyperlipidemia   . Asthma   . MI (myocardial infarction) 2009    no stent placement  . Gout   . Obesity   . TIA (transient ischemic attack) 2013  . Esophageal stricture   . OA (osteoarthritis)   . Insomnia   . DDD (degenerative disc disease), lumbar   . Gastritis 2010  . Seasonal allergies   . OSA on CPAP     History  Smoking status  . Former Smoker -- 1.50 packs/day for 10 years  . Types: Cigarettes  . Quit date: 11/06/1991  Smokeless tobacco  . Never Used    Comment: quit 1993    Family History  Problem Relation Age of Onset  . Heart attack Mother   . Colon cancer Neg Hx   . Esophageal cancer Neg Hx   . Rectal cancer Neg Hx   . Stomach cancer Neg Hx     Current Outpatient Prescriptions on File Prior to Visit  Medication Sig Dispense Refill  . albuterol (PROVENTIL HFA;VENTOLIN HFA) 108 (90 BASE) MCG/ACT inhaler Inhale 2 puffs into the lungs every 6 (six) hours as needed.  1 each  1  . amLODipine (NORVASC) 10 MG tablet Take 1 tablet (10 mg total) by mouth at bedtime.      Marland Kitchen aspirin 81 MG chewable tablet Chew 81 mg by mouth daily.      . cetirizine (ZYRTEC) 10 MG tablet Take 1 tablet (10 mg total) by mouth daily as needed.  90  tablet  3  . lisinopril (PRINIVIL,ZESTRIL) 40 MG tablet Take 1 tablet (40 mg total) by mouth at bedtime.      . metFORMIN (GLUCOPHAGE) 1000 MG tablet Take 1 tablet (1,000 mg total) by mouth 2 (two) times daily with a meal.  180 tablet  3  . metoprolol (LOPRESSOR) 50 MG tablet Take 1 tablet (50 mg total) by mouth 2 (two) times daily.  180 tablet  3  . naproxen (NAPROSYN) 500 MG tablet Take 500 mg by mouth daily.      Marland Kitchen omeprazole (PRILOSEC) 40 MG capsule Take 1 capsule (40 mg total) by mouth daily.  90 capsule  3  . pravastatin (PRAVACHOL) 40 MG tablet Take 1 tablet (40 mg total) by mouth daily.  30 tablet  6  . triamcinolone ointment (KENALOG) 0.5 % Apply topically 2 (two) times daily. For the next week  30 g  0   No  current facility-administered medications on file prior to visit.    ROS: Per HPI   Physical Exam Filed Vitals:   11/18/12 1417  BP: 134/95  Pulse: 96  Temp:    Orthostatics: L 143/98 78 Sit 132/92 85 Sta 134/95 96  Physical Examination: General appearance - alert, well appearing, and in no distress and acyanotic, in no respiratory distress Eyes - sclera anicteric, PERRL Ears - bilateral TM's and external ear canals normal Mouth - mucous membranes moist, pharynx normal without lesions Neck - supple, no significant adenopathy, no carotid bruits noted Chest - clear to auscultation, no wheezes, rales or rhonchi, symmetric air entry, no chest wall tenderness noted Heart - normal rate, regular rhythm, normal S1, S2, no murmurs, rubs, clicks or gallops Neurological - alert, oriented, normal speech, no focal findings or movement disorder noted Extremities - peripheral pulses normal, no pedal edema, no clubbing or cyanosis, monofilament sensory exam is normal in both feet, no edema, redness or tenderness in the calves or thighs Skin - normal coloration and turgor, no rashes, no suspicious skin lesions noted  EKG: NSR, mild LVH criteria  Lab Results  Component Value Date   HGBA1C 6.1 11/18/2012    Assessment/Plan: Please see individual problem list.  I have spent >25 minutes in the care of this patient with >50% spent in counseling/coordination of care regarding light headedness, chest pain, diabetes, and hypertension.Marland Kitchen

## 2012-11-19 NOTE — Assessment & Plan Note (Signed)
BP not at goal today. If continues to be elevated at next appointment will need to alter medication regimen.

## 2012-11-20 ENCOUNTER — Telehealth: Payer: Self-pay | Admitting: *Deleted

## 2012-11-20 ENCOUNTER — Other Ambulatory Visit: Payer: Self-pay | Admitting: Family Medicine

## 2012-11-20 MED ORDER — BLOOD GLUCOSE TEST VI STRP: 1.0000 | ORAL_STRIP | Status: DC

## 2012-11-20 NOTE — Telephone Encounter (Signed)
Spoke with pt and informed of the A1C.   Pt mentions to me that his BP has been running high.  "Last night it was 160/135"  Pt not c/o dypnea, CP or dizzness.  Advised to check BP AM and PM, or anytime he feels like it is up.  He will make a log and come to Dr. Isabella Bowens 1st available on the 23.  He will also being machine so we can compare with our machine.  Pt aware of red flags to go to ED. Oswell Say, Maryjo Rochester

## 2012-11-20 NOTE — Telephone Encounter (Signed)
Refilled for patient.

## 2012-11-20 NOTE — Telephone Encounter (Signed)
Message copied by Osborne Oman on Thu Nov 20, 2012 12:47 PM ------      Message from: Birdie Sons, ERIC G      Created: Thu Nov 20, 2012 10:53 AM       Please let the patient know that his A1c was 6.1, which is great. Will see him at his next f/u appointment. ------

## 2012-11-21 ENCOUNTER — Telehealth: Payer: Self-pay | Admitting: Emergency Medicine

## 2012-11-21 NOTE — Telephone Encounter (Signed)
Emergency Line Call Patient calling emergency line for gout flare.  States it is in his left great toe, and is exactly the same as prior flares.  I cannot find a history of prior gout in the chart.  Discussed taking naprosyn tonight and tomorrow.  If pain continues in AM, should go to UC for evaluation.  If develops fevers, chills, spreading pain/redness, he should go to the ED.  He expressed understanding and agreement.

## 2012-11-27 ENCOUNTER — Encounter (HOSPITAL_COMMUNITY): Payer: Self-pay | Admitting: Emergency Medicine

## 2012-11-27 ENCOUNTER — Emergency Department (HOSPITAL_COMMUNITY)
Admission: EM | Admit: 2012-11-27 | Discharge: 2012-11-27 | Disposition: A | Discharge status: Home / Self Care | Payer: Medicare Other | Attending: Emergency Medicine | Admitting: Emergency Medicine

## 2012-11-27 DIAGNOSIS — N390 Urinary tract infection, site not specified: Secondary | ICD-10-CM | POA: Insufficient documentation

## 2012-11-27 DIAGNOSIS — H53489 Generalized contraction of visual field, unspecified eye: Secondary | ICD-10-CM | POA: Insufficient documentation

## 2012-11-27 DIAGNOSIS — K219 Gastro-esophageal reflux disease without esophagitis: Secondary | ICD-10-CM | POA: Insufficient documentation

## 2012-11-27 DIAGNOSIS — Z9989 Dependence on other enabling machines and devices: Secondary | ICD-10-CM | POA: Insufficient documentation

## 2012-11-27 DIAGNOSIS — Z79899 Other long term (current) drug therapy: Secondary | ICD-10-CM | POA: Insufficient documentation

## 2012-11-27 DIAGNOSIS — I252 Old myocardial infarction: Secondary | ICD-10-CM | POA: Insufficient documentation

## 2012-11-27 DIAGNOSIS — Z8709 Personal history of other diseases of the respiratory system: Secondary | ICD-10-CM | POA: Insufficient documentation

## 2012-11-27 DIAGNOSIS — J45909 Unspecified asthma, uncomplicated: Secondary | ICD-10-CM | POA: Insufficient documentation

## 2012-11-27 DIAGNOSIS — Z8639 Personal history of other endocrine, nutritional and metabolic disease: Secondary | ICD-10-CM | POA: Insufficient documentation

## 2012-11-27 DIAGNOSIS — Z8739 Personal history of other diseases of the musculoskeletal system and connective tissue: Secondary | ICD-10-CM | POA: Insufficient documentation

## 2012-11-27 DIAGNOSIS — Z8673 Personal history of transient ischemic attack (TIA), and cerebral infarction without residual deficits: Secondary | ICD-10-CM | POA: Insufficient documentation

## 2012-11-27 DIAGNOSIS — I2510 Atherosclerotic heart disease of native coronary artery without angina pectoris: Secondary | ICD-10-CM | POA: Insufficient documentation

## 2012-11-27 DIAGNOSIS — E669 Obesity, unspecified: Secondary | ICD-10-CM | POA: Insufficient documentation

## 2012-11-27 DIAGNOSIS — R519 Headache, unspecified: Secondary | ICD-10-CM

## 2012-11-27 DIAGNOSIS — Z862 Personal history of diseases of the blood and blood-forming organs and certain disorders involving the immune mechanism: Secondary | ICD-10-CM | POA: Insufficient documentation

## 2012-11-27 DIAGNOSIS — I10 Essential (primary) hypertension: Secondary | ICD-10-CM | POA: Insufficient documentation

## 2012-11-27 DIAGNOSIS — Z8719 Personal history of other diseases of the digestive system: Secondary | ICD-10-CM | POA: Insufficient documentation

## 2012-11-27 DIAGNOSIS — Z7982 Long term (current) use of aspirin: Secondary | ICD-10-CM | POA: Insufficient documentation

## 2012-11-27 DIAGNOSIS — R51 Headache: Secondary | ICD-10-CM | POA: Insufficient documentation

## 2012-11-27 DIAGNOSIS — E785 Hyperlipidemia, unspecified: Secondary | ICD-10-CM | POA: Insufficient documentation

## 2012-11-27 DIAGNOSIS — Z87891 Personal history of nicotine dependence: Secondary | ICD-10-CM | POA: Insufficient documentation

## 2012-11-27 DIAGNOSIS — E119 Type 2 diabetes mellitus without complications: Secondary | ICD-10-CM | POA: Insufficient documentation

## 2012-11-27 DIAGNOSIS — G4733 Obstructive sleep apnea (adult) (pediatric): Secondary | ICD-10-CM | POA: Insufficient documentation

## 2012-11-27 DIAGNOSIS — H53483 Generalized contraction of visual field, bilateral: Secondary | ICD-10-CM

## 2012-11-27 LAB — CBC WITH DIFFERENTIAL/PLATELET
Basophils Absolute: 0 10*3/uL (ref 0.0–0.1)
Basophils Relative: 1 % (ref 0–1)
Eosinophils Absolute: 0.3 10*3/uL (ref 0.0–0.7)
Eosinophils Relative: 4 % (ref 0–5)
HCT: 40.1 % (ref 39.0–52.0)
Hemoglobin: 13.1 g/dL (ref 13.0–17.0)
Lymphocytes Relative: 36 % (ref 12–46)
Lymphs Abs: 2.7 10*3/uL (ref 0.7–4.0)
MCH: 26.3 pg (ref 26.0–34.0)
MCHC: 32.7 g/dL (ref 30.0–36.0)
MCV: 80.5 fL (ref 78.0–100.0)
Monocytes Absolute: 0.4 10*3/uL (ref 0.1–1.0)
Monocytes Relative: 6 % (ref 3–12)
Neutro Abs: 4 10*3/uL (ref 1.7–7.7)
Neutrophils Relative %: 54 % (ref 43–77)
Platelets: 301 10*3/uL (ref 150–400)
RBC: 4.98 MIL/uL (ref 4.22–5.81)
RDW: 14.6 % (ref 11.5–15.5)
WBC: 7.5 10*3/uL (ref 4.0–10.5)

## 2012-11-27 LAB — URINE MICROSCOPIC-ADD ON

## 2012-11-27 LAB — COMPREHENSIVE METABOLIC PANEL
ALT: 25 U/L (ref 0–53)
AST: 19 U/L (ref 0–37)
Albumin: 3.9 g/dL (ref 3.5–5.2)
Alkaline Phosphatase: 75 U/L (ref 39–117)
BUN: 10 mg/dL (ref 6–23)
CO2: 27 mEq/L (ref 19–32)
Calcium: 9.8 mg/dL (ref 8.4–10.5)
Chloride: 102 mEq/L (ref 96–112)
Creatinine, Ser: 1.08 mg/dL (ref 0.50–1.35)
GFR calc Af Amer: 86 mL/min — ABNORMAL LOW (ref >90)
GFR calc non Af Amer: 74 mL/min — ABNORMAL LOW (ref >90)
Glucose, Bld: 96 mg/dL (ref 70–99)
Potassium: 4.2 mEq/L (ref 3.5–5.1)
Sodium: 138 mEq/L (ref 135–145)
Total Bilirubin: 0.5 mg/dL (ref 0.3–1.2)
Total Protein: 7.9 g/dL (ref 6.0–8.3)

## 2012-11-27 LAB — URINALYSIS, ROUTINE W REFLEX MICROSCOPIC
Bilirubin Urine: NEGATIVE
Glucose, UA: NEGATIVE mg/dL
Hgb urine dipstick: NEGATIVE
Ketones, ur: NEGATIVE mg/dL
Nitrite: NEGATIVE
Protein, ur: NEGATIVE mg/dL
Specific Gravity, Urine: 1.016 (ref 1.005–1.030)
Urobilinogen, UA: 1 mg/dL (ref 0.0–1.0)
pH: 5.5 (ref 5.0–8.0)

## 2012-11-27 LAB — GLUCOSE, CAPILLARY
Glucose-Capillary: 88 mg/dL (ref 70–99)
Glucose-Capillary: 99 mg/dL (ref 70–99)

## 2012-11-27 LAB — TROPONIN I: Troponin I: <0.3 ng/mL (ref <0.30)

## 2012-11-27 MED ORDER — CIPROFLOXACIN HCL 500 MG PO TABS: 500.0000 mg | ORAL_TABLET | Freq: Two times a day (BID) | ORAL | Status: AC

## 2012-11-27 NOTE — ED Provider Notes (Signed)
I discussed the urinalysis with patient.  With suggestion of urinary tract infection, history of diabetes, antibiotics will be started, pending urine culture.    Gerhard Munch, MD 11/27/12 1710

## 2012-11-27 NOTE — ED Provider Notes (Signed)
CSN: [id number]     Arrival date & time 11/27/12  1241 History   First MD Initiated Contact with Patient 11/27/12 1335     Chief Complaint  Patient presents with  . Blurred Vision   (Consider location/radiation/quality/duration/timing/severity/associated sxs/prior Treatment) HPI Patient states he's been getting "tunnel vision" episodically for the past couple of days. They checked his blood sugar is in the 90 range. Patient states he begins to have symptoms of hyperglycemia with a sugar drops below 100. Just complains of a mild gradual onset of frontal headache. He describes the pain as dull. he currently has no visual symptoms. Denies fevers, chills, nausea, vomiting, chest pain, shortness of breath, abdominal pain, dysuria or frequent urination. He has no focal weakness or numbness. Patient states he stopped taking his metformin several weeks ago. Patient has not eaten lunch today Past Medical History  Diagnosis Date  . HTN (hypertension)   . Diabetes mellitus   . GERD (gastroesophageal reflux disease)   . CAD (coronary artery disease)     Cardiac cath 2002 with 25% distal RCA stenosis.   . Hyperlipidemia   . Asthma   . MI (myocardial infarction) 2009    no stent placement  . Gout   . Obesity   . TIA (transient ischemic attack) 2013  . Esophageal stricture   . OA (osteoarthritis)   . Insomnia   . DDD (degenerative disc disease), lumbar   . Gastritis 2010  . Seasonal allergies   . OSA on CPAP    Past Surgical History  Procedure Laterality Date  . Vasectomy    . Nasal sinus surgery    . Esophagogastroduodenoscopy      multiple   Family History  Problem Relation Age of Onset  . Heart attack Mother   . Colon cancer Neg Hx   . Esophageal cancer Neg Hx   . Rectal cancer Neg Hx   . Stomach cancer Neg Hx    History  Substance Use Topics  . Smoking status: Former Smoker -- 1.50 packs/day for 10 years    Types: Cigarettes    Quit date: 11/06/1991  . Smokeless tobacco:  Never Used     Comment: quit 1993  . Alcohol Use: No    Review of Systems  Constitutional: Negative for fever and chills.  HENT: Negative for neck pain and neck stiffness.   Eyes: Positive for visual disturbance.  Respiratory: Negative for chest tightness and shortness of breath.   Cardiovascular: Negative for chest pain, palpitations and leg swelling.  Gastrointestinal: Negative for nausea, vomiting, abdominal pain, diarrhea and constipation.  Genitourinary: Negative for dysuria, hematuria and flank pain.  Musculoskeletal: Negative for myalgias and back pain.  Skin: Negative for pallor and rash.  Neurological: Positive for headaches. Negative for dizziness, weakness, light-headedness and numbness.  All other systems reviewed and are negative.    Allergies  Atorvastatin and Codeine  Home Medications   Current Outpatient Rx  Name  Route  Sig  Dispense  Refill  . albuterol (PROVENTIL HFA;VENTOLIN HFA) 108 (90 BASE) MCG/ACT inhaler   Inhalation   Inhale 2 puffs into the lungs every 6 (six) hours as needed.   1 each   1   . amLODipine (NORVASC) 10 MG tablet   Oral   Take 1 tablet (10 mg total) by mouth at bedtime.         Marland Kitchen aspirin 81 MG chewable tablet   Oral   Chew 81 mg by mouth daily.         Marland Kitchen  cetirizine (ZYRTEC) 10 MG tablet   Oral   Take 1 tablet (10 mg total) by mouth daily as needed.   90 tablet   3   . lisinopril (PRINIVIL,ZESTRIL) 40 MG tablet   Oral   Take 1 tablet (40 mg total) by mouth at bedtime.         . metFORMIN (GLUCOPHAGE) 1000 MG tablet   Oral   Take 1 tablet (1,000 mg total) by mouth 2 (two) times daily with a meal.   180 tablet   3   . metoprolol (LOPRESSOR) 50 MG tablet   Oral   Take 1 tablet (50 mg total) by mouth 2 (two) times daily.   180 tablet   3   . mometasone (NASONEX) 50 MCG/ACT nasal spray   Nasal   Place 2 sprays into the nose daily.         . naproxen (NAPROSYN) 500 MG tablet   Oral   Take 500 mg by mouth  daily.         Marland Kitchen omeprazole (PRILOSEC) 40 MG capsule   Oral   Take 1 capsule (40 mg total) by mouth daily.   90 capsule   3   . pravastatin (PRAVACHOL) 40 MG tablet   Oral   Take 1 tablet (40 mg total) by mouth daily.   30 tablet   6   . triamcinolone ointment (KENALOG) 0.5 %   Topical   Apply topically 2 (two) times daily. For the next week   30 g   0    BP 165/104  Pulse 79  Temp(Src) 97.8 F (36.6 C) (Oral)  Resp 20  SpO2 97% Physical Exam  Nursing note and vitals reviewed. Constitutional: He is oriented to person, place, and time. He appears well-developed and well-nourished. No distress.  HENT:  Head: Normocephalic and atraumatic.  Mouth/Throat: Oropharynx is clear and moist.  Eyes: EOM are normal. Pupils are equal, round, and reactive to light.  Neck: Normal range of motion. Neck supple.  Cardiovascular: Normal rate and regular rhythm.   Pulmonary/Chest: Effort normal and breath sounds normal. No respiratory distress. He has no wheezes. He has no rales.  Abdominal: Soft. Bowel sounds are normal. He exhibits no distension and no mass. There is no tenderness. There is no rebound and no guarding.  Musculoskeletal: Normal range of motion. He exhibits no edema and no tenderness.  Neurological: He is alert and oriented to person, place, and time.  Patient is alert and oriented x3 with clear, goal oriented speech. Patient has 5/5 motor in all extremities. Sensation is intact to light touch. Patient has a normal gait and walks without assistance.   Skin: Skin is warm and dry. No rash noted. No erythema.  Psychiatric: He has a normal mood and affect. His behavior is normal.    ED Course  Procedures (including critical care time) Labs Review Labs Reviewed  GLUCOSE, CAPILLARY  GLUCOSE, CAPILLARY  CBC WITH DIFFERENTIAL  COMPREHENSIVE METABOLIC PANEL  URINALYSIS, ROUTINE W REFLEX MICROSCOPIC  TROPONIN I   Imaging Review No results found.  Date: 11/27/2012  Rate:  62  Rhythm: normal sinus rhythm  QRS Axis: normal  Intervals: PR prolonged  ST/T Wave abnormalities: normal  Conduction Disutrbances:none  Narrative Interpretation:   Old EKG Reviewed: unchanged Patient has several PVCs  MDM  Patient's exam is benign. Labs within normal limits. UA is pending. Oncoming ED physician to check results and likely discharge home.   Loren Racer, MD 11/28/12 [phone]

## 2012-11-27 NOTE — ED Notes (Signed)
Pt states that vision gets a little blurry today and checked Blood glucose and it was 90. Also states he has a headache and a little weak.

## 2012-11-27 NOTE — ED Notes (Signed)
Pt unable to provide urine specimen at this time.  Urinal provided.

## 2012-12-02 ENCOUNTER — Ambulatory Visit (HOSPITAL_COMMUNITY)
Admission: RE | Admit: 2012-12-02 | Discharge: 2012-12-02 | Disposition: A | Discharge status: Home / Self Care | Payer: Medicare Other | Source: Ambulatory Visit | Attending: Family Medicine | Admitting: Family Medicine

## 2012-12-02 ENCOUNTER — Ambulatory Visit (INDEPENDENT_AMBULATORY_CARE_PROVIDER_SITE_OTHER): Payer: Medicare Other | Admitting: Family Medicine

## 2012-12-02 VITALS — BP 166/101 | HR 69 | Temp 98.3°F | Ht 68.0 in | Wt 303.0 lb

## 2012-12-02 DIAGNOSIS — R220 Localized swelling, mass and lump, head: Secondary | ICD-10-CM | POA: Insufficient documentation

## 2012-12-02 DIAGNOSIS — K0889 Other specified disorders of teeth and supporting structures: Secondary | ICD-10-CM

## 2012-12-02 DIAGNOSIS — K056 Periodontal disease, unspecified: Secondary | ICD-10-CM | POA: Insufficient documentation

## 2012-12-02 DIAGNOSIS — K069 Disorder of gingiva and edentulous alveolar ridge, unspecified: Secondary | ICD-10-CM | POA: Insufficient documentation

## 2012-12-02 DIAGNOSIS — R221 Localized swelling, mass and lump, neck: Secondary | ICD-10-CM | POA: Insufficient documentation

## 2012-12-02 DIAGNOSIS — I10 Essential (primary) hypertension: Secondary | ICD-10-CM

## 2012-12-02 DIAGNOSIS — G43909 Migraine, unspecified, not intractable, without status migrainosus: Secondary | ICD-10-CM

## 2012-12-02 DIAGNOSIS — K089 Disorder of teeth and supporting structures, unspecified: Secondary | ICD-10-CM

## 2012-12-02 MED ORDER — HYDROCHLOROTHIAZIDE 12.5 MG PO CAPS: 12.5000 mg | ORAL_CAPSULE | Freq: Every day | ORAL | Status: DC

## 2012-12-02 MED ORDER — CARVEDILOL 12.5 MG PO TABS: 12.5000 mg | ORAL_TABLET | Freq: Two times a day (BID) | ORAL | Status: DC

## 2012-12-02 NOTE — Patient Instructions (Addendum)
Nice to see you again. Your blood pressure remains elevated. We are going to start a medication called coreg in place of your metoprolol. Hopefully this will help with your blood pressure. For your headaches, we will observe these thing. They may improve as your blood pressure comes under better control. If they keep recurring we will start on an additional medication for treatment of these headaches.  Carvedilol tablets What is this medicine? CARVEDILOL (KAR ve dil ol) is a beta-blocker. Beta-blockers reduce the workload on the heart and help it to beat more regularly. This medicine is used to treat high blood pressure and heart failure. This medicine may be used for other purposes; ask your health care provider or pharmacist if you have questions. What should I tell my health care provider before I take this medicine? They need to know if you have any of these conditions: -circulation problems -diabetes -history of heart attack or heart disease -liver disease -lung or breathing disease, like asthma or emphysema -pheochromocytoma -slow or irregular heartbeat -thyroid disease -an unusual or allergic reaction to carvedilol, other beta-blockers, medicines, foods, dyes, or preservatives -pregnant or trying to get pregnant -breast-feeding How should I use this medicine? Take this medicine by mouth with a glass of water. Follow the directions on the prescription label. It is best to take the tablets with food. Take your doses at regular intervals. Do not take your medicine more often than directed. Do not stop taking except on the advice of your doctor or health care professional. Talk to your pediatrician regarding the use of this medicine in children. Special care may be needed. Overdosage: If you think you have taken too much of this medicine contact a poison control center or emergency room at once. NOTE: This medicine is only for you. Do not share this medicine with others. What if I miss a  dose? If you miss a dose, take it as soon as you can. If it is almost time for your next dose, take only that dose. Do not take double or extra doses. What may interact with this medicine? Do not take this medicine with any of the following medications: -sotalol This medicine may also interact with the following medications: -cimetidine -clonidine -cyclosporine -digoxin -MAOIs like Carbex, Eldepryl, Marplan, Nardil, and Parnate -medicines for blood pressure, heart disease, irregular heart beat -medicines for depression like fluoxetine and paroxetine -medicines for diabetes -medicines to control heart rhythm like propafenone and quinidine -reserpine -rifampin This list may not describe all possible interactions. Give your health care provider a list of all the medicines, herbs, non-prescription drugs, or dietary supplements you use. Also tell them if you smoke, drink alcohol, or use illegal drugs. Some items may interact with your medicine. What should I watch for while using this medicine? Check your heart rate and blood pressure regularly while you are taking this medicine. Ask your doctor or health care professional what your heart rate and blood pressure should be, and when you should contact him or her. Do not stop taking this medicine suddenly. This could lead to serious heart-related effects. Contact your doctor or health care professional if you have difficulty breathing while taking this drug. Check your weight daily. Ask your doctor or health care professional when you should notify him/her of any weight gain. You may get drowsy or dizzy. Do not drive, use machinery, or do anything that requires mental alertness until you know how this medicine affects you. To reduce the risk of dizzy or fainting spells,  do not sit or stand up quickly. Alcohol can make you more drowsy, and increase flushing and rapid heartbeats. Avoid alcoholic drinks. If you have diabetes, check your blood sugar as  directed. Tell your doctor if you have changes in your blood sugar while you are taking this medicine. If you are going to have surgery, tell your doctor or health care professional that you are taking this medicine. What side effects may I notice from receiving this medicine? Side effects that you should report to your doctor or health care professional as soon as possible: -allergic reactions like skin rash, itching or hives, swelling of the face, lips, or tongue -breathing problems -dark urine -irregular heartbeat -swollen legs or ankles -vomiting -yellowing of the eyes or skin Side effects that usually do not require medical attention (report to your doctor or health care professional if they continue or are bothersome): -change in sex drive or performance -diarrhea -dry eyes (especially if wearing contact lenses) -dry, itching skin -headache -nausea -unusually tired This list may not describe all possible side effects. Call your doctor for medical advice about side effects. You may report side effects to FDA at 1-800-FDA-1088. Where should I keep my medicine? Keep out of the reach of children. Store at room temperature below 30 degrees C (86 degrees F). Protect from moisture. Keep container tightly closed. Throw away any unused medicine after the expiration date. NOTE: This sheet is a summary. It may not cover all possible information. If you have questions about this medicine, talk to your doctor, pharmacist, or health care provider.  2013, [employer]. (05/22/2007 2:39:22 PM)

## 2012-12-03 ENCOUNTER — Encounter (HOSPITAL_COMMUNITY): Payer: Self-pay

## 2012-12-03 ENCOUNTER — Emergency Department (HOSPITAL_COMMUNITY)
Admission: EM | Admit: 2012-12-03 | Discharge: 2012-12-03 | Disposition: A | Discharge status: Home / Self Care | Payer: Medicare Other | Attending: Emergency Medicine | Admitting: Emergency Medicine

## 2012-12-03 DIAGNOSIS — IMO0002 Reserved for concepts with insufficient information to code with codable children: Secondary | ICD-10-CM | POA: Insufficient documentation

## 2012-12-03 DIAGNOSIS — E669 Obesity, unspecified: Secondary | ICD-10-CM | POA: Insufficient documentation

## 2012-12-03 DIAGNOSIS — G4733 Obstructive sleep apnea (adult) (pediatric): Secondary | ICD-10-CM | POA: Insufficient documentation

## 2012-12-03 DIAGNOSIS — Z87891 Personal history of nicotine dependence: Secondary | ICD-10-CM | POA: Insufficient documentation

## 2012-12-03 DIAGNOSIS — I252 Old myocardial infarction: Secondary | ICD-10-CM | POA: Insufficient documentation

## 2012-12-03 DIAGNOSIS — R519 Headache, unspecified: Secondary | ICD-10-CM | POA: Insufficient documentation

## 2012-12-03 DIAGNOSIS — K219 Gastro-esophageal reflux disease without esophagitis: Secondary | ICD-10-CM | POA: Insufficient documentation

## 2012-12-03 DIAGNOSIS — Z792 Long term (current) use of antibiotics: Secondary | ICD-10-CM | POA: Insufficient documentation

## 2012-12-03 DIAGNOSIS — E119 Type 2 diabetes mellitus without complications: Secondary | ICD-10-CM | POA: Insufficient documentation

## 2012-12-03 DIAGNOSIS — Z79899 Other long term (current) drug therapy: Secondary | ICD-10-CM | POA: Insufficient documentation

## 2012-12-03 DIAGNOSIS — I10 Essential (primary) hypertension: Secondary | ICD-10-CM

## 2012-12-03 DIAGNOSIS — Z8673 Personal history of transient ischemic attack (TIA), and cerebral infarction without residual deficits: Secondary | ICD-10-CM | POA: Insufficient documentation

## 2012-12-03 DIAGNOSIS — E785 Hyperlipidemia, unspecified: Secondary | ICD-10-CM | POA: Insufficient documentation

## 2012-12-03 DIAGNOSIS — J45909 Unspecified asthma, uncomplicated: Secondary | ICD-10-CM | POA: Insufficient documentation

## 2012-12-03 DIAGNOSIS — I2510 Atherosclerotic heart disease of native coronary artery without angina pectoris: Secondary | ICD-10-CM | POA: Insufficient documentation

## 2012-12-03 DIAGNOSIS — Z7982 Long term (current) use of aspirin: Secondary | ICD-10-CM | POA: Insufficient documentation

## 2012-12-03 DIAGNOSIS — M1990 Unspecified osteoarthritis, unspecified site: Secondary | ICD-10-CM | POA: Insufficient documentation

## 2012-12-03 NOTE — Assessment & Plan Note (Signed)
Patient with likely migraine with aura given headache and visual disturbances. Has not had HA or vision changes prior to a week ago. Patient is already on prophylactic medications (calcium channel blocker and beta bloacker). Will continue to monitor. If continues to have issues with headaches will consider starting additional prophylactic medications.

## 2012-12-03 NOTE — ED Provider Notes (Signed)
Medical screening examination/treatment/procedure(s) were performed by non-physician practitioner and as supervising physician I was immediately available for consultation/collaboration.    Gilda Crease, MD 12/03/12 [phone]

## 2012-12-03 NOTE — Progress Notes (Signed)
Patient ID: Alex [name], male   DOB: Dec 05, 1955, 57 y.o.   MRN: [id number] Alex KLUTTZ is a 57 y.o. male who presents today for f/u of BP, headaches, and teeth.  HYPERTENSION Disease Monitoring Home BP Monitoring checking and running in the 140's-150's/90's-100's Chest pain- complains of mild twinge/achey pain in left upper abdomen, once a week, unchanged from previously, lasts for about a minute, has seen a cardiologist in the past 6 months and there were no plans to do further ischemic work-up at this time. Stress test in 9/13 without ischemia. No associated diaphoresis or dyspnea with this twinge. Dyspnea- no Medications Compliance-  taking. Lightheadedness-  no  Edema- no  HA: recently saw optho for follow-up of recent episode of tunnel vision. Optho thought this was related to migraine HA as this vision change was followed by headache of several days duration. Patient does not have headache now. Vision is back to normal. Optho note states has been on propranolol previously, though patient states has not been on this.   Teeth: states has several teeth that have been causing an issue. Does not have dental insurance, so has not been able to get these issues taken care of. Several of his teeth are loose.  Past Medical History  Diagnosis Date  . HTN (hypertension)   . Diabetes mellitus   . GERD (gastroesophageal reflux disease)   . CAD (coronary artery disease)     Cardiac cath 2002 with 25% distal RCA stenosis.   . Hyperlipidemia   . Asthma   . MI (myocardial infarction) 2009    no stent placement  . Gout   . Obesity   . TIA (transient ischemic attack) 2013  . Esophageal stricture   . OA (osteoarthritis)   . Insomnia   . DDD (degenerative disc disease), lumbar   . Gastritis 2010  . Seasonal allergies   . OSA on CPAP     History  Smoking status  . Former Smoker -- 1.50 packs/day for 10 years  . Types: Cigarettes  . Quit date: 11/06/1991  Smokeless tobacco  . Never Used     Comment: quit 1993    Family History  Problem Relation Age of Onset  . Heart attack Mother   . Colon cancer Neg Hx   . Esophageal cancer Neg Hx   . Rectal cancer Neg Hx   . Stomach cancer Neg Hx     Current Outpatient Prescriptions on File Prior to Visit  Medication Sig Dispense Refill  . albuterol (PROVENTIL HFA;VENTOLIN HFA) 108 (90 BASE) MCG/ACT inhaler Inhale 2 puffs into the lungs every 6 (six) hours as needed.  1 each  1  . amLODipine (NORVASC) 10 MG tablet Take 1 tablet (10 mg total) by mouth at bedtime.      Marland Kitchen aspirin 81 MG chewable tablet Chew 81 mg by mouth daily.      . cetirizine (ZYRTEC) 10 MG tablet Take 1 tablet (10 mg total) by mouth daily as needed.  90 tablet  3  . ciprofloxacin (CIPRO) 500 MG tablet Take 1 tablet (500 mg total) by mouth 2 (two) times daily.  20 tablet  0  . lisinopril (PRINIVIL,ZESTRIL) 40 MG tablet Take 1 tablet (40 mg total) by mouth at bedtime.      . metFORMIN (GLUCOPHAGE) 1000 MG tablet Take 1 tablet (1,000 mg total) by mouth 2 (two) times daily with a meal.  180 tablet  3  . mometasone (NASONEX) 50 MCG/ACT nasal spray Place 2  sprays into the nose as needed (allergies).       . naproxen (NAPROSYN) 500 MG tablet Take 500 mg by mouth daily as needed (pain).       Marland Kitchen omeprazole (PRILOSEC) 40 MG capsule Take 1 capsule (40 mg total) by mouth daily.  90 capsule  3  . pravastatin (PRAVACHOL) 40 MG tablet Take 1 tablet (40 mg total) by mouth daily.  30 tablet  6  . triamcinolone ointment (KENALOG) 0.5 % Apply topically 2 (two) times daily. For the next week  30 g  0   No current facility-administered medications on file prior to visit.    ROS: Per HPI   Physical Exam Filed Vitals:   12/02/12 1415  BP: 166/101  Pulse: 69  Temp: 98.3 F (36.8 C)    Physical Examination: General appearance - alert, well appearing, and in no distress Mouth - mucous membranes moist, pharynx normal without lesions and poor dentition with left maxillary incisor  with looseness and swelling surrounding the tooth Chest - clear to auscultation, no wheezes, rales or rhonchi, symmetric air entry Heart - normal rate, regular rhythm, normal S1, S2, no murmurs, rubs, clicks or gallops Extremities - no pedal edema noted   Lab Results  Component Value Date   HGBA1C 6.1 11/18/2012    Assessment/Plan: Please see individual problem list.  I have spent >25 minutes in the care of this patient with >50% spent in counseling/coordination of care regarding HTN, HA, and teeth issues.

## 2012-12-03 NOTE — Assessment & Plan Note (Addendum)
Blood pressure continues to not be well controlled. Mostly in the 140's-150's/100's. Patient has history of recent gout so can't use HCTZ as additional treatment. Will transition to coreg for better control of BP than metoprolol. Continue lisinopril and norvasc at current doses. Will see back in 1 month to follow-up on this issue.

## 2012-12-03 NOTE — ED Notes (Signed)
Patient presents to ED via POV. Pt states that his PCP changed his blood pressure medication yesterday from metoprolol to coreg. Pt checked his blood pressure several times this am and it "continued to increase." Pt c/o of shivering this am but states that he was not cold. Pt denies any pain. Pt is A&Ox4 upon arrival to ED.

## 2012-12-03 NOTE — ED Provider Notes (Signed)
CSN: [id number]     Arrival date & time 12/03/12  4098 History   First MD Initiated Contact with Patient 12/03/12 [phone]     Chief Complaint  Patient presents with  . Hypertension   (Consider location/radiation/quality/duration/timing/severity/associated sxs/prior Treatment) The history is provided by the patient and medical records.   Pt presents to the ED for HTN.  States he had an appt yesterday with his PCP and he decided to change his BP meds from metoprolol to coreg.  Last night was his first dose.  States he woke up this morning and felt "off" so he decided to check his BP and it was 180's/120's.  States he checked his BP a few more times and it remained elevated.  Pt states he did ask his PCP to calibrate his BP machine but this was not done.  Denies any chest pain, SOB, palpitations, dizziness, weakness, headaches, visual disturbance, nausea, vomiting, or diaphoresis.  Pt hypertensive on arrival but only to 157/97.    PCP- Birdie Sons  Past Medical History  Diagnosis Date  . HTN (hypertension)   . Diabetes mellitus   . GERD (gastroesophageal reflux disease)   . CAD (coronary artery disease)     Cardiac cath 2002 with 25% distal RCA stenosis.   . Hyperlipidemia   . Asthma   . MI (myocardial infarction) 2009    no stent placement  . Gout   . Obesity   . TIA (transient ischemic attack) 2013  . Esophageal stricture   . OA (osteoarthritis)   . Insomnia   . DDD (degenerative disc disease), lumbar   . Gastritis 2010  . Seasonal allergies   . OSA on CPAP    Past Surgical History  Procedure Laterality Date  . Vasectomy    . Nasal sinus surgery    . Esophagogastroduodenoscopy      multiple   Family History  Problem Relation Age of Onset  . Heart attack Mother   . Colon cancer Neg Hx   . Esophageal cancer Neg Hx   . Rectal cancer Neg Hx   . Stomach cancer Neg Hx    History  Substance Use Topics  . Smoking status: Former Smoker -- 1.50 packs/day for 10 years    Types:  Cigarettes    Quit date: 11/06/1991  . Smokeless tobacco: Never Used     Comment: quit 1993  . Alcohol Use: No    Review of Systems  Constitutional:       HTN  All other systems reviewed and are negative.    Allergies  Atorvastatin and Codeine  Home Medications   Current Outpatient Rx  Name  Route  Sig  Dispense  Refill  . albuterol (PROVENTIL HFA;VENTOLIN HFA) 108 (90 BASE) MCG/ACT inhaler   Inhalation   Inhale 2 puffs into the lungs every 6 (six) hours as needed.   1 each   1   . amLODipine (NORVASC) 10 MG tablet   Oral   Take 1 tablet (10 mg total) by mouth at bedtime.         Marland Kitchen aspirin 81 MG chewable tablet   Oral   Chew 81 mg by mouth daily.         . carvedilol (COREG) 12.5 MG tablet   Oral   Take 1 tablet (12.5 mg total) by mouth 2 (two) times daily with a meal.   60 tablet   1   . cetirizine (ZYRTEC) 10 MG tablet   Oral   Take 1  tablet (10 mg total) by mouth daily as needed.   90 tablet   3   . ciprofloxacin (CIPRO) 500 MG tablet   Oral   Take 1 tablet (500 mg total) by mouth 2 (two) times daily.   20 tablet   0   . lisinopril (PRINIVIL,ZESTRIL) 40 MG tablet   Oral   Take 1 tablet (40 mg total) by mouth at bedtime.         . metFORMIN (GLUCOPHAGE) 1000 MG tablet   Oral   Take 1 tablet (1,000 mg total) by mouth 2 (two) times daily with a meal.   180 tablet   3   . mometasone (NASONEX) 50 MCG/ACT nasal spray   Nasal   Place 2 sprays into the nose daily.         . naproxen (NAPROSYN) 500 MG tablet   Oral   Take 500 mg by mouth daily.         Marland Kitchen omeprazole (PRILOSEC) 40 MG capsule   Oral   Take 1 capsule (40 mg total) by mouth daily.   90 capsule   3   . pravastatin (PRAVACHOL) 40 MG tablet   Oral   Take 1 tablet (40 mg total) by mouth daily.   30 tablet   6   . triamcinolone ointment (KENALOG) 0.5 %   Topical   Apply topically 2 (two) times daily. For the next week   30 g   0    BP 157/97  Pulse 86  Temp(Src)  98.1 F (36.7 C) (Oral)  Ht 5\' 8"  (1.727 m)  Wt 303 lb (137.44 kg)  BMI 46.08 kg/m2  SpO2 98%  Physical Exam  Nursing note and vitals reviewed. Constitutional: He is oriented to person, place, and time. He appears well-developed and well-nourished. No distress.  Resting comfortably in bed  HENT:  Head: Normocephalic and atraumatic.  Mouth/Throat: Oropharynx is clear and moist.  Eyes: Conjunctivae and EOM are normal. Pupils are equal, round, and reactive to light.  Neck: Normal range of motion. Neck supple.  Cardiovascular: Normal rate, regular rhythm and normal heart sounds.   Pulmonary/Chest: Effort normal and breath sounds normal. No respiratory distress. He has no wheezes.  Musculoskeletal: Normal range of motion. He exhibits no edema.  Neurological: He is alert and oriented to person, place, and time. He has normal strength. He displays no atrophy and no tremor. No cranial nerve deficit or sensory deficit. He exhibits normal muscle tone. He displays no seizure activity.  Skin: Skin is warm and dry. He is not diaphoretic.  Psychiatric: He has a normal mood and affect.    ED Course  Procedures (including critical care time) Labs Review Labs Reviewed - No data to display Imaging Review   MDM   1. HTN (hypertension)     6:37 AM Pt evaluated.  In NAD, BP improving from triage, now 140/96.  No hypertensive urgency or emergency at this time.  Instructed to continue taking Coreg as directed.  Advised to make sure he is checking BP at same time daily.  Will FU with PCP if problems occur.  Discussed plan with pt, he agreed.  Return precautions advised.  Garlon Hatchet, PA-C 12/03/12 [phone]

## 2012-12-03 NOTE — Assessment & Plan Note (Signed)
Orthopantogram ordered revealing peridontal disease in multiple areas. Needs to see dentist, though does not have insurance.

## 2012-12-11 ENCOUNTER — Ambulatory Visit: Payer: Medicare Other

## 2012-12-15 ENCOUNTER — Encounter (HOSPITAL_COMMUNITY): Payer: Self-pay | Admitting: Emergency Medicine

## 2012-12-15 ENCOUNTER — Emergency Department (HOSPITAL_COMMUNITY)
Admission: EM | Admit: 2012-12-15 | Discharge: 2012-12-15 | Disposition: A | Discharge status: Home / Self Care | Payer: No Typology Code available for payment source | Attending: Emergency Medicine | Admitting: Emergency Medicine

## 2012-12-15 ENCOUNTER — Ambulatory Visit: Payer: Medicare Other | Admitting: Family Medicine

## 2012-12-15 DIAGNOSIS — J45909 Unspecified asthma, uncomplicated: Secondary | ICD-10-CM | POA: Insufficient documentation

## 2012-12-15 DIAGNOSIS — S0993XA Unspecified injury of face, initial encounter: Secondary | ICD-10-CM | POA: Insufficient documentation

## 2012-12-15 DIAGNOSIS — Z7982 Long term (current) use of aspirin: Secondary | ICD-10-CM | POA: Diagnosis not present

## 2012-12-15 DIAGNOSIS — E785 Hyperlipidemia, unspecified: Secondary | ICD-10-CM | POA: Diagnosis not present

## 2012-12-15 DIAGNOSIS — I10 Essential (primary) hypertension: Secondary | ICD-10-CM | POA: Diagnosis not present

## 2012-12-15 DIAGNOSIS — K219 Gastro-esophageal reflux disease without esophagitis: Secondary | ICD-10-CM | POA: Diagnosis not present

## 2012-12-15 DIAGNOSIS — M1990 Unspecified osteoarthritis, unspecified site: Secondary | ICD-10-CM | POA: Insufficient documentation

## 2012-12-15 DIAGNOSIS — I252 Old myocardial infarction: Secondary | ICD-10-CM | POA: Insufficient documentation

## 2012-12-15 DIAGNOSIS — I2510 Atherosclerotic heart disease of native coronary artery without angina pectoris: Secondary | ICD-10-CM | POA: Diagnosis not present

## 2012-12-15 DIAGNOSIS — E119 Type 2 diabetes mellitus without complications: Secondary | ICD-10-CM | POA: Insufficient documentation

## 2012-12-15 DIAGNOSIS — Z8673 Personal history of transient ischemic attack (TIA), and cerebral infarction without residual deficits: Secondary | ICD-10-CM | POA: Diagnosis not present

## 2012-12-15 DIAGNOSIS — S199XXA Unspecified injury of neck, initial encounter: Secondary | ICD-10-CM | POA: Insufficient documentation

## 2012-12-15 DIAGNOSIS — M109 Gout, unspecified: Secondary | ICD-10-CM | POA: Diagnosis not present

## 2012-12-15 DIAGNOSIS — G4733 Obstructive sleep apnea (adult) (pediatric): Secondary | ICD-10-CM | POA: Insufficient documentation

## 2012-12-15 DIAGNOSIS — IMO0002 Reserved for concepts with insufficient information to code with codable children: Secondary | ICD-10-CM | POA: Diagnosis not present

## 2012-12-15 DIAGNOSIS — Z87891 Personal history of nicotine dependence: Secondary | ICD-10-CM | POA: Diagnosis not present

## 2012-12-15 DIAGNOSIS — Y92410 Unspecified street and highway as the place of occurrence of the external cause: Secondary | ICD-10-CM | POA: Diagnosis not present

## 2012-12-15 DIAGNOSIS — Y9389 Activity, other specified: Secondary | ICD-10-CM | POA: Diagnosis not present

## 2012-12-15 DIAGNOSIS — M542 Cervicalgia: Secondary | ICD-10-CM

## 2012-12-15 DIAGNOSIS — E669 Obesity, unspecified: Secondary | ICD-10-CM | POA: Diagnosis not present

## 2012-12-15 MED ORDER — HYDROCODONE-ACETAMINOPHEN 5-325 MG PO TABS: 1.0000 | ORAL_TABLET | Freq: Four times a day (QID) | ORAL | Status: DC | PRN
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
## 2012-12-19 NOTE — ED Provider Notes (Signed)
Medical screening examination/treatment/procedure(s) were performed by non-physician practitioner and as supervising physician I was immediately available for consultation/collaboration.  Ambert Virrueta R. Yvaine Jankowiak, MD 12/19/12 1022 

## 2013-01-01 ENCOUNTER — Encounter: Payer: Self-pay | Admitting: Family Medicine

## 2013-01-01 ENCOUNTER — Ambulatory Visit (INDEPENDENT_AMBULATORY_CARE_PROVIDER_SITE_OTHER): Payer: Medicare Other | Admitting: Family Medicine

## 2013-01-01 VITALS — BP 150/108 | HR 83 | Temp 97.6°F | Wt 303.0 lb

## 2013-01-01 DIAGNOSIS — I10 Essential (primary) hypertension: Secondary | ICD-10-CM

## 2013-01-01 DIAGNOSIS — Z23 Encounter for immunization: Secondary | ICD-10-CM

## 2013-01-01 DIAGNOSIS — E119 Type 2 diabetes mellitus without complications: Secondary | ICD-10-CM

## 2013-01-01 MED ORDER — GLUCOSE BLOOD VI STRP: ORAL_STRIP | Status: DC

## 2013-01-01 MED ORDER — CARVEDILOL 25 MG PO TABS: 25.0000 mg | ORAL_TABLET | Freq: Two times a day (BID) | ORAL | Status: DC

## 2013-01-01 NOTE — Patient Instructions (Signed)
Nice to see you again. Your blood pressure continues to be elevated above goal and was quite high initially today. It came down to an acceptable level. We will increase your coreg to 25 mg twice a day. Continue to take your amlodipine and lisinopril. Please check your blood pressure twice a day for the next 2 weeks. Record this and bring it to your next appointment. I will see you back in 2 weeks.  Hypertension Hypertension is another name for high blood pressure. High blood pressure may mean that your heart needs to work harder to pump blood. Blood pressure consists of two numbers, which includes a higher number over a lower number (example: 110/72). HOME CARE   Make lifestyle changes as told by your doctor. This may include weight loss and exercise.  Take your blood pressure medicine every day.  Limit how much salt you use.  Stop smoking if you smoke.  Do not use drugs.  Talk to your doctor if you are using decongestants or birth control pills. These medicines might make blood pressure higher.  Females should not drink more than 1 alcoholic drink per day. Males should not drink more than 2 alcoholic drinks per day.  See your doctor as told. GET HELP RIGHT AWAY IF:   You have a blood pressure reading with a top number of 180 or higher.  You get a very bad headache.  You get blurred or changing vision.  You feel confused.  You feel weak, numb, or faint.  You get chest or belly (abdominal) pain.  You throw up (vomit).  You cannot breathe very well. MAKE SURE YOU:   Understand these instructions.  Will watch your condition.  Will get help right away if you are not doing well or get worse. Document Released: 08/15/2007 Document Revised: 05/21/2011 Document Reviewed: 08/15/2007 [facility] Patient Information 2014 [place], [place].

## 2013-01-04 NOTE — Progress Notes (Signed)
Patient ID: Alex [name], male   DOB: [birth year], 57 y.o.   MRN: [id number] Alex [name] is a 57 y.o. male who presents today for f/u of HTN.  HYPERTENSION Disease Monitoring Home BP Monitoring not checking Chest pain- no    Dyspnea- no Medications Compliance-  taking. Lightheadedness-  Occasional Edema- no  Denies alcohol and illicit drug use. Quit smoking >20 years ago.  Past Medical History  Diagnosis Date  . HTN (hypertension)   . Diabetes mellitus   . GERD (gastroesophageal reflux disease)   . CAD (coronary artery disease)     Cardiac cath 2002 with 25% distal RCA stenosis.   . Hyperlipidemia   . Asthma   . MI (myocardial infarction) 2009    no stent placement  . Gout   . Obesity   . TIA (transient ischemic attack) 2013  . Esophageal stricture   . OA (osteoarthritis)   . Insomnia   . DDD (degenerative disc disease), lumbar   . Gastritis 2010  . Seasonal allergies   . OSA on CPAP     History  Smoking status  . Former Smoker -- 1.50 packs/day for 10 years  . Types: Cigarettes  . Quit date: 11/06/1991  Smokeless tobacco  . Never Used    Comment: quit 1993    Family History  Problem Relation Age of Onset  . Heart attack Mother   . Colon cancer Neg Hx   . Esophageal cancer Neg Hx   . Rectal cancer Neg Hx   . Stomach cancer Neg Hx     Current Outpatient Prescriptions on File Prior to Visit  Medication Sig Dispense Refill  . albuterol (PROVENTIL HFA;VENTOLIN HFA) 108 (90 BASE) MCG/ACT inhaler Inhale 2 puffs into the lungs every 6 (six) hours as needed.  1 each  1  . amLODipine (NORVASC) 10 MG tablet Take 1 tablet (10 mg total) by mouth at bedtime.      Marland Kitchen aspirin 81 MG chewable tablet Chew 81 mg by mouth daily.      . cetirizine (ZYRTEC) 10 MG tablet Take 1 tablet (10 mg total) by mouth daily as needed.  90 tablet  3  . HYDROcodone-acetaminophen (NORCO/VICODIN) 5-325 MG per tablet Take 1 tablet by mouth every 6 (six) hours as needed for pain.  10 tablet  0   . lisinopril (PRINIVIL,ZESTRIL) 40 MG tablet Take 1 tablet (40 mg total) by mouth at bedtime.      . metFORMIN (GLUCOPHAGE) 1000 MG tablet Take 1 tablet (1,000 mg total) by mouth 2 (two) times daily with a meal.  180 tablet  3  . mometasone (NASONEX) 50 MCG/ACT nasal spray Place 2 sprays into the nose as needed (allergies).       . naproxen (NAPROSYN) 500 MG tablet Take 500 mg by mouth daily as needed (pain).       Marland Kitchen omeprazole (PRILOSEC) 40 MG capsule Take 1 capsule (40 mg total) by mouth daily.  90 capsule  3  . pravastatin (PRAVACHOL) 40 MG tablet Take 40 mg by mouth at bedtime.      . triamcinolone ointment (KENALOG) 0.5 % Apply topically 2 (two) times daily. For the next week  30 g  0   No current facility-administered medications on file prior to visit.    ROS: Per HPI   Physical Exam Filed Vitals:   01/01/13 1352  BP: 150/108  Pulse: 83  Temp: 97.6 F (36.4 C)    Physical Examination: General appearance - alert,  well appearing, and in no distress Eyes - pupils equal and reactive, extraocular eye movements intact, fundoscopic exam without apparent abnormalities Chest - clear to auscultation, no wheezes, rales or rhonchi, symmetric air entry Heart - normal rate, regular rhythm, normal S1, S2, no murmurs, rubs, clicks or gallops Neurological - alert, oriented, normal speech, no focal findings or movement disorder noted, cranial nerves II through XII intact, 5/5 strength in bilateral biceps, triceps, grip, hip flexors, quads, hamstrings, plantar and dorsiflexion, sensation to light touch intact throughout bilateral upper and lower extremities, 2+ patellar reflexes bilaterally Extremities - no pedal edema noted  Lab Results  Component Value Date   TSH 1.524 12/24/2011   Lab Results  Component Value Date   HGBA1C 6.1 11/18/2012    Assessment/Plan: Please see individual problem list.

## 2013-01-04 NOTE — Assessment & Plan Note (Signed)
On presentation patient with blood pressure of 195/119, improved to 170/110 with manual cuff. Patient denies any current symptoms related to elevated blood pressure. Following full interview patient with blood pressure of 150/108. No signs of end organ damage at this time with his elevated blood pressure. Patient has had recent difficult to control blood pressure. Recently made the change from metoprolol to coreg and patient continues to have elevated blood pressures. Will increase coreg to 25 mg BID given he had an adequate pulse to tolerate this increase. Advised to check blood pressure at home BID for the next 2 weeks and record this. He is to follow-up in 2 weeks and bring this log in to the appointment. If he continues to be difficult to control he may benefit from a referral to pharmacy clinic. Given red flags for return to care.

## 2013-01-09 ENCOUNTER — Telehealth: Payer: Self-pay | Admitting: Family Medicine

## 2013-01-09 DIAGNOSIS — K089 Disorder of teeth and supporting structures, unspecified: Secondary | ICD-10-CM

## 2013-01-09 NOTE — Telephone Encounter (Signed)
Patient needs a referral to the dental clinic.  He now has the orange card.  He thinks he may have an infection.

## 2013-01-09 NOTE — Telephone Encounter (Signed)
Will forward to MD. Larron Armor,CMA  

## 2013-01-09 NOTE — Telephone Encounter (Signed)
Referral placed.

## 2013-01-09 NOTE — Telephone Encounter (Signed)
Hey Dr. Birdie Sons,  Dental clinic is only accepting urgent referrals at this time, which means pt would need to be on pain meds AND abx.  Do you want him to make an appt with you?Marland Kitchen Emmajo Bennette, Maryjo Rochester

## 2013-01-11 NOTE — Telephone Encounter (Signed)
Yes, Have him make an appointment.

## 2013-01-12 NOTE — Telephone Encounter (Signed)
Appt with you on 01/21/13. Alex [name], Alex [name]

## 2013-01-21 ENCOUNTER — Encounter: Payer: Self-pay | Admitting: Family Medicine

## 2013-01-21 ENCOUNTER — Ambulatory Visit (INDEPENDENT_AMBULATORY_CARE_PROVIDER_SITE_OTHER): Payer: Medicare Other | Admitting: Family Medicine

## 2013-01-21 VITALS — BP 148/92 | HR 64 | Temp 97.9°F | Wt 302.0 lb

## 2013-01-21 DIAGNOSIS — R079 Chest pain, unspecified: Secondary | ICD-10-CM

## 2013-01-21 DIAGNOSIS — I10 Essential (primary) hypertension: Secondary | ICD-10-CM

## 2013-01-21 DIAGNOSIS — E119 Type 2 diabetes mellitus without complications: Secondary | ICD-10-CM

## 2013-01-21 MED ORDER — GLUCOSE BLOOD VI STRP: ORAL_STRIP | Status: DC

## 2013-01-21 NOTE — Progress Notes (Signed)
Patient ID: NIKKI RUSNAK, male   DOB: 05/17/1955, 57 y.o.   MRN: [id number] POWELL HALBERT is a 57 y.o. male who presents today for follow-up of blood pressure and diabetes.  HYPERTENSION Disease Monitoring Home BP Monitoring checking Chest pain- yes, has intermittent chest pain that has been followed by [facility] cardiology. Notes left lower sternal border pain described as mild and achey that comes on for <1 minute. Is not associated with diaphoresis or radiation. Not worsened by exertion. Stops on its own. Per prior cardiology note they planned no further ischemic work-up, though patient was to see them last month and has not had that follow-up appointment.    Dyspnea- no Medications Compliance-  taking.  Edema- no Notes he is eating less salty foods. Has been eating healthier foods. Just got an elliptical and plans on starting to exercise.  DIABETES Disease Monitoring: Blood Sugar ranges-not checking Notes stopped taking metformin 2-3 months ago related to upset stomach.   Past Medical History  Diagnosis Date  . HTN (hypertension)   . Diabetes mellitus   . GERD (gastroesophageal reflux disease)   . CAD (coronary artery disease)     Cardiac cath 2002 with 25% distal RCA stenosis.   . Hyperlipidemia   . Asthma   . MI (myocardial infarction) 2009    no stent placement  . Gout   . Obesity   . TIA (transient ischemic attack) 2013  . Esophageal stricture   . OA (osteoarthritis)   . Insomnia   . DDD (degenerative disc disease), lumbar   . Gastritis 2010  . Seasonal allergies   . OSA on CPAP     History  Smoking status  . Former Smoker -- 1.50 packs/day for 10 years  . Types: Cigarettes  . Quit date: 11/06/1991  Smokeless tobacco  . Never Used    Comment: quit 1993    Family History  Problem Relation Age of Onset  . Heart attack Mother   . Colon cancer Neg Hx   . Esophageal cancer Neg Hx   . Rectal cancer Neg Hx   . Stomach cancer Neg Hx     Current Outpatient  Prescriptions on File Prior to Visit  Medication Sig Dispense Refill  . albuterol (PROVENTIL HFA;VENTOLIN HFA) 108 (90 BASE) MCG/ACT inhaler Inhale 2 puffs into the lungs every 6 (six) hours as needed.  1 each  1  . amLODipine (NORVASC) 10 MG tablet Take 1 tablet (10 mg total) by mouth at bedtime.      Marland Kitchen aspirin 81 MG chewable tablet Chew 81 mg by mouth daily.      . carvedilol (COREG) 25 MG tablet Take 1 tablet (25 mg total) by mouth 2 (two) times daily with a meal.  60 tablet  3  . cetirizine (ZYRTEC) 10 MG tablet Take 1 tablet (10 mg total) by mouth daily as needed.  90 tablet  3  . HYDROcodone-acetaminophen (NORCO/VICODIN) 5-325 MG per tablet Take 1 tablet by mouth every 6 (six) hours as needed for pain.  10 tablet  0  . lisinopril (PRINIVIL,ZESTRIL) 40 MG tablet Take 1 tablet (40 mg total) by mouth at bedtime.      . metFORMIN (GLUCOPHAGE) 1000 MG tablet Take 1 tablet (1,000 mg total) by mouth 2 (two) times daily with a meal.  180 tablet  3  . mometasone (NASONEX) 50 MCG/ACT nasal spray Place 2 sprays into the nose as needed (allergies).       . naproxen (NAPROSYN) 500  MG tablet Take 500 mg by mouth daily as needed (pain).       Marland Kitchen omeprazole (PRILOSEC) 40 MG capsule Take 1 capsule (40 mg total) by mouth daily.  90 capsule  3  . pravastatin (PRAVACHOL) 40 MG tablet Take 40 mg by mouth at bedtime.      . triamcinolone ointment (KENALOG) 0.5 % Apply topically 2 (two) times daily. For the next week  30 g  0   No current facility-administered medications on file prior to visit.    ROS: Per HPI   Physical Exam Filed Vitals:   01/21/13 1030  BP: 148/92  Pulse: 64  Temp: 97.9 F (36.6 C)    Physical Examination: General appearance - alert, well appearing, and in no distress and overweight   Assessment/Plan: Please see individual problem list.

## 2013-01-21 NOTE — Assessment & Plan Note (Signed)
Continues to intermittent episodes of chest pain. They are atypical in nature. He had a stress test without ischemia and an echo with moderate LVH September 2013. At his last cardiology appointment they stated no further ischemic work-up. Per their note he was to follow-up in October of this year, though he did not realize this. An appointment was scheduled for the patient for later this month for f/u of his chest pain, HTN, and CHF. He was given red flags for return to care in his AVs.

## 2013-01-21 NOTE — Assessment & Plan Note (Signed)
Patient continues to be above goal. He is to follow-up with his cardiologist regarding his CHF, HTN, and CAD later this month. The appointment was scheduled for the patient while he was in clinic today. Asked that he follow-up in our pharmacy clinic following his appointment with the cardiologist to have further help in managing his blood pressure. He is currently on coreg, amlodipine, and lisinopril. He can not take HCTZ relating to his history of gout. Given red flags for return in AVS.

## 2013-01-21 NOTE — Assessment & Plan Note (Signed)
Not currently taking metformin. Last A1c was at goal and was done about 1-2 months following stopping his metformin. Will check A1c at visit in one month. Will make a determination on medication management of this issue at that time.

## 2013-01-21 NOTE — Patient Instructions (Addendum)
Nice to see you. Please follow-up with your cardiologist. I would like for you to be seen in our pharmacy clinic for help in managing your blood pressure. It has remained elevated despite the medication changes we've made.  SEEK IMMEDIATE MEDICAL CARE IF:   You develop nausea, vomiting, or shortness of breath.  You feel faint, lightheaded, or pass out.  Your chest discomfort gets worse.  You are sweating or experience sudden profound fatigue.  Your discomfort lasts longer than 15 minutes.

## 2013-01-21 NOTE — Assessment & Plan Note (Signed)
>>  ASSESSMENT AND PLAN FOR DIABETES TYPE 2, CONTROLLED WRITTEN ON 01/21/2013 12:41 PM BY SONNENBERG, ERIC G, MD  Not currently taking metformin. Last A1c was at goal and was done about 1-2 months following stopping his metformin. Will check A1c at visit in one month. Will make a determination on medication management of this issue at that time.

## 2013-01-23 MED ORDER — GLUCOSE BLOOD VI STRP: ORAL_STRIP | Status: DC

## 2013-01-23 NOTE — Addendum Note (Signed)
Addended by: Birdie Sons, Alexee Delsanto G on: 01/23/2013 12:03 PM   Modules accepted: Orders

## 2013-01-26 ENCOUNTER — Other Ambulatory Visit: Payer: Self-pay | Admitting: Family Medicine

## 2013-01-26 DIAGNOSIS — E119 Type 2 diabetes mellitus without complications: Secondary | ICD-10-CM

## 2013-01-26 MED ORDER — GLUCOSE BLOOD VI STRP: ORAL_STRIP | Status: DC

## 2013-01-30 ENCOUNTER — Encounter: Payer: Self-pay | Admitting: Physician Assistant

## 2013-01-30 ENCOUNTER — Ambulatory Visit (INDEPENDENT_AMBULATORY_CARE_PROVIDER_SITE_OTHER): Payer: Medicare Other | Admitting: Physician Assistant

## 2013-01-30 VITALS — BP 114/84 | HR 79 | Ht 68.5 in | Wt 300.4 lb

## 2013-01-30 DIAGNOSIS — I2510 Atherosclerotic heart disease of native coronary artery without angina pectoris: Secondary | ICD-10-CM

## 2013-01-30 DIAGNOSIS — G4733 Obstructive sleep apnea (adult) (pediatric): Secondary | ICD-10-CM

## 2013-01-30 DIAGNOSIS — I10 Essential (primary) hypertension: Secondary | ICD-10-CM

## 2013-01-30 DIAGNOSIS — I5030 Unspecified diastolic (congestive) heart failure: Secondary | ICD-10-CM

## 2013-01-30 DIAGNOSIS — E785 Hyperlipidemia, unspecified: Secondary | ICD-10-CM

## 2013-01-30 NOTE — Progress Notes (Signed)
[address] [place], [place]  [address] Phone: [phone] Fax:  [phone]  Date:  01/30/2013   ID:  Alex [name], DOB [birth year], MRN [id number]  PCP:  Alex Alar, MD  Cardiologist:  Dr. Verne [name]     History of Present Illness: Alex [name] is a 57 y.o. male the history of minimal plaque by cardiac catheterization in 2002, T2DM, HTN, HL, GERD, asthma, sleep apnea. He was admitted to the hospital 02/2008 with chest pain. Cardiac enzymes were normal. Echo suggested possible RA/RV mass. Follow up echocardiogram 03/2008 demonstrated normal LV function and no mass across the tricuspid valve. Myoview in 02/2008 was negative for ischemia or scar. There was consideration for cardiac MRI at one point but this was canceled given normal echocardiogram in regards to RA/RV thickening.  LHC (06/2000): RCA 25%, EF 55%.  Echocardiogram (10/2011): Moderate LVH, EF 55-65%, normal wall motion, grade 1 diastolic dysfunction, PASP 39.  Lexiscan Myoview (11/2011):  No ischemia, EF 55%, normal study.  He was admitted in 12/2011 with TIA type symptoms.  Carotid US (12/2011): No significant stenosis.  Last seen by Dr. Verne [name] in 06/2012.  Lasix was restarted at that time.  He recently saw his PCP and was noted to need cardiac follow up and was scheduled for an appointment today.  He is doing well.  The patient denies chest pain, shortness of breath, syncope, orthopnea, PND or significant pedal edema.   Recent Labs: 02/22/2012: Direct LDL 130*  07/14/2012: Pro B Natriuretic peptide (BNP) 18.97  11/27/2012: ALT 25; Creatinine 1.08; Hemoglobin 13.1; Potassium 4.2   Wt Readings from Last 3 Encounters:  01/30/13 300 lb 6.4 oz (136.261 kg)  01/21/13 302 lb (136.986 kg)  01/01/13 303 lb (137.44 kg)     Past Medical History  Diagnosis Date  . HTN (hypertension)   . Diabetes mellitus   . GERD (gastroesophageal reflux disease)   . CAD (coronary artery disease)    Cardiac cath 2002 with 25% distal RCA stenosis.   . Hyperlipidemia   . Asthma   . MI (myocardial infarction) 2009    no stent placement  . Gout   . Obesity   . TIA (transient ischemic attack) 2013  . Esophageal stricture   . OA (osteoarthritis)   . Insomnia   . DDD (degenerative disc disease), lumbar   . Gastritis 2010  . Seasonal allergies   . OSA on CPAP     Current Outpatient Prescriptions  Medication Sig Dispense Refill  . albuterol (PROVENTIL HFA;VENTOLIN HFA) 108 (90 BASE) MCG/ACT inhaler Inhale 2 puffs into the lungs every 6 (six) hours as needed.  1 each  1  . amLODipine (NORVASC) 10 MG tablet Take 1 tablet (10 mg total) by mouth at bedtime.      Marland Kitchen aspirin 81 MG chewable tablet Chew 81 mg by mouth daily.      . carvedilol (COREG) 25 MG tablet Take 1 tablet (25 mg total) by mouth 2 (two) times daily with a meal.  60 tablet  3  . cetirizine (ZYRTEC) 10 MG tablet Take 1 tablet (10 mg total) by mouth daily as needed.  90 tablet  3  . glucose blood (ONE TOUCH ULTRA TEST) test strip Use one strip daily prior to your first meal of the day.  100 each  12  . lisinopril (PRINIVIL,ZESTRIL) 40 MG tablet Take 1 tablet (40 mg total) by mouth at bedtime.      . mometasone (NASONEX)  50 MCG/ACT nasal spray Place 2 sprays into the nose as needed (allergies).       . naproxen (NAPROSYN) 500 MG tablet Take 500 mg by mouth daily as needed (pain).       Marland Kitchen omeprazole (PRILOSEC) 40 MG capsule Take 1 capsule (40 mg total) by mouth daily.  90 capsule  3  . pravastatin (PRAVACHOL) 40 MG tablet Take 40 mg by mouth at bedtime.      . triamcinolone ointment (KENALOG) 0.5 % Apply topically 2 (two) times daily. For the next week  30 g  0   No current facility-administered medications for this visit.    Allergies:   Atorvastatin and Codeine   Social History:  The patient  reports that he quit smoking about 21 years ago. His smoking use included Cigarettes. He has a 15 pack-year smoking history. He has  never used smokeless tobacco. He reports that he does not drink alcohol or use illicit drugs.   Family History:  The patient's family history includes Heart attack in his mother. There is no history of Colon cancer, Esophageal cancer, Rectal cancer, or Stomach cancer.   ROS:  Please see the history of present illness.      All other systems reviewed and negative.   PHYSICAL EXAM: VS:  BP 114/84  Pulse 79  Ht 5' 8.5" (1.74 m)  Wt 300 lb 6.4 oz (136.261 kg)  BMI 45.01 kg/m2 Well nourished, well developed, in no acute distress HEENT: normal Neck: no JVD Cardiac:  normal S1, S2; RRR; no murmur Lungs:  clear to auscultation bilaterally, no wheezing, rhonchi or rales Abd: soft, nontender, no hepatomegaly Ext: no edema Skin: warm and dry Neuro:  CNs 2-12 intact, no focal abnormalities noted  EKG:  NSR, HR 79, normal axis, NSSTTW changes, no change from prior     ASSESSMENT AND PLAN:  1. CAD:  No angina.  Continue ASA and statin.  2. Chronic Diastolic CHF:  Volume stable.  Continue current Rx. 3. Hypertension:  Controlled.  4. Hyperlipidemia:  Labs followed by primary care.  Continue statin.  5. Diabetes Mellitus:  F/u with primary care.  6. Sleep Apnea:  He is compliant sometimes with CPAP.  He knows he needs to wear this more consistently.  7. Disposition:  F/u with Dr. Verne [name] in 6 mos.   Signed, Tereso Newcomer, PA-C  01/30/2013 11:15 AM

## 2013-01-30 NOTE — Patient Instructions (Signed)
Your physician assistant recommends that you schedule a follow-up appointment in: 6 mos with Dr. Verne Carrow.

## 2013-02-02 ENCOUNTER — Ambulatory Visit (INDEPENDENT_AMBULATORY_CARE_PROVIDER_SITE_OTHER): Payer: Medicare Other | Admitting: Pharmacist

## 2013-02-02 ENCOUNTER — Encounter: Payer: Self-pay | Admitting: Pharmacist

## 2013-02-02 VITALS — BP 146/97 | HR 56 | Ht 68.5 in | Wt 301.7 lb

## 2013-02-02 DIAGNOSIS — E785 Hyperlipidemia, unspecified: Secondary | ICD-10-CM

## 2013-02-02 DIAGNOSIS — M109 Gout, unspecified: Secondary | ICD-10-CM

## 2013-02-02 DIAGNOSIS — I10 Essential (primary) hypertension: Secondary | ICD-10-CM

## 2013-02-02 LAB — BASIC METABOLIC PANEL
BUN: 16 mg/dL (ref 6–23)
CO2: 28 mEq/L (ref 19–32)
Calcium: 9.7 mg/dL (ref 8.4–10.5)
Chloride: 104 mEq/L (ref 96–112)
Creat: 1.08 mg/dL (ref 0.50–1.35)
Glucose, Bld: 95 mg/dL (ref 70–99)
Potassium: 4.2 mEq/L (ref 3.5–5.3)
Sodium: 137 mEq/L (ref 135–145)

## 2013-02-02 LAB — URIC ACID: Uric Acid, Serum: 9 mg/dL — ABNORMAL HIGH (ref 4.0–7.8)

## 2013-02-02 LAB — LIPID PANEL
Cholesterol: 150 mg/dL (ref 0–200)
HDL: 29 mg/dL — ABNORMAL LOW (ref >39)
LDL Cholesterol: 88 mg/dL (ref 0–99)
Total CHOL/HDL Ratio: 5.2 Ratio
Triglycerides: 165 mg/dL — ABNORMAL HIGH (ref <150)
VLDL: 33 mg/dL (ref 0–40)

## 2013-02-02 MED ORDER — OMEPRAZOLE 40 MG PO CPDR: 40.0000 mg | DELAYED_RELEASE_CAPSULE | Freq: Every day | ORAL | Status: DC

## 2013-02-02 NOTE — Progress Notes (Signed)
S:    Patient arrives alone and in good spirits. He presents to the clinic for blood pressure follow-up. Patient reports Exercising on a glider for 10 minutes daily and making changes to his diet over the last couple months. He reports eating oatmeal or eggs and bacon for breakfast, sandwiches for lunch or skipping lunch and having an early dinner of spaghetti or chicken and canned greens. Dinner is his big meal of the day. He reports snacking daily on Doritos or popcorn. He reports a goal of losing 5 more pounds before his next clinic visit.   Medication compliance is reported to be good. He is currently taking all blood pressure meds in the morning.   Current BP Medications include:  Amlodipine 10mg  daily, Lisinopril 40 mg daily, carvedilol 25 mg daily.   O:  Last 3 Office BP readings: 114/84 mmHg 148/92 mmHg 150/108 mmHg  Today's Office BP reading:  146/97 initially and repeat 146/102 mmHg (manual reading)  BMET    Component Value Date/Time   NA 138 11/27/2012 1423   K 4.2 11/27/2012 1423   CL 102 11/27/2012 1423   CO2 27 11/27/2012 1423   GLUCOSE 96 11/27/2012 1423   BUN 10 11/27/2012 1423   CREATININE 1.08 11/27/2012 1423   CREATININE 1.28 07/14/2012 1133   CALCIUM 9.8 11/27/2012 1423   GFRNONAA 74* 11/27/2012 1423   GFRAA 86* 11/27/2012 1423    A/P: History of hypertension since 2009. Blood pressure is currently not at goal on 3 drug regimen of Amlodipine, Carvedilol, and Lisinopril all at max doses. Goal BP <140/90. Educated patient on better diet choices and the importance of lowering sodium intake. Encouraged patient to increase amount of exercise to 15-20 minutes daily and to eat 3 small meals per day and minimize snacking. Educated patient on healthy snack choices.  F/U Rx Clinic visit the first week of January.Total time in face-to-face counseling 30 minutes.  Patient seen with Leafy Kindle, PharmD Candidate, and Anthony Sar, PharmD Resident.

## 2013-02-02 NOTE — Patient Instructions (Signed)
Thank you so much for coming in today.   Continue to exercise 15-20 minutes per day.   Continue to watch your diet. Eat less canned and processed food and try more fresh or frozen.   Keep working toward you weight loss goals.   Follow-up in Pharmacy Clinic the first week of January.

## 2013-02-03 NOTE — Assessment & Plan Note (Signed)
History of hypertension since 2009. Blood pressure is currently not at goal on 3 drug regimen of Amlodipine, Carvedilol, and Lisinopril all at max doses. Goal BP <140/90. Educated patient on better diet choices and the importance of lowering sodium intake. Encouraged patient to increase amount of exercise to 15-20 minutes daily and to eat 3 small meals per day and minimize snacking. Educated patient on healthy snack choices.  F/U Rx Clinic visit the first week of January.Total time in face-to-face counseling 30 minutes.  Patient seen with Leafy Kindle, PharmD Candidate, and Anthony Sar, PharmD Resident.

## 2013-02-03 NOTE — Progress Notes (Signed)
Patient ID: Alex [name], male   DOB: Apr 11, 1955, 57 y.o.   MRN: [id number] Reviewed: Agree with Dr Macky Lower documentation and management.

## 2013-02-08 ENCOUNTER — Other Ambulatory Visit: Payer: Self-pay | Admitting: Family Medicine

## 2013-02-08 NOTE — Telephone Encounter (Signed)
Patient is no longer on metoprolol. He should have refills of his coreg, which is the beta-blocker he is currently on. Please advise patient of this.

## 2013-02-27 ENCOUNTER — Telehealth: Payer: Self-pay | Admitting: Family Medicine

## 2013-02-27 MED ORDER — PRAVASTATIN SODIUM 40 MG PO TABS: 40.0000 mg | ORAL_TABLET | Freq: Every day | ORAL | Status: DC

## 2013-02-27 NOTE — Telephone Encounter (Signed)
Pt called and needs a refill on Pravastatin. jw

## 2013-03-03 ENCOUNTER — Telehealth: Payer: Self-pay | Admitting: Family Medicine

## 2013-03-03 NOTE — Telephone Encounter (Signed)
Refill request for Naproxen 

## 2013-03-04 MED ORDER — NAPROXEN 500 MG PO TABS: 500.0000 mg | ORAL_TABLET | Freq: Every day | ORAL | Status: DC | PRN

## 2013-04-16 ENCOUNTER — Other Ambulatory Visit: Payer: Self-pay | Admitting: Family Medicine

## 2013-04-17 NOTE — Telephone Encounter (Signed)
Patient needs to return for follow up. One month refill sent in.

## 2013-04-20 NOTE — Telephone Encounter (Signed)
Appt made.  Saanvika Vazques,CMA  

## 2013-04-30 ENCOUNTER — Other Ambulatory Visit: Payer: Self-pay | Admitting: Family Medicine

## 2013-05-02 NOTE — Telephone Encounter (Signed)
One month of refills given. Patient should be advised to f/u for his HTN within the next month.

## 2013-05-04 NOTE — Telephone Encounter (Signed)
appt 05/07/13. Eren Ryser, [name]

## 2013-05-07 ENCOUNTER — Ambulatory Visit: Payer: Medicare Other | Admitting: Family Medicine

## 2013-05-08 ENCOUNTER — Other Ambulatory Visit: Payer: Self-pay | Admitting: Family Medicine

## 2013-05-08 NOTE — Telephone Encounter (Signed)
Pt is aware and he already has an appt to see you on Thursday 05-14-13.  Harlis Champoux,CMA

## 2013-05-08 NOTE — Telephone Encounter (Signed)
Please call the patient and advise him to come in for a follow-up appointment for his HTN. I will give him a one month supply for now.

## 2013-05-14 ENCOUNTER — Ambulatory Visit (INDEPENDENT_AMBULATORY_CARE_PROVIDER_SITE_OTHER): Payer: Medicare Other | Admitting: Family Medicine

## 2013-05-14 ENCOUNTER — Encounter: Payer: Self-pay | Admitting: Family Medicine

## 2013-05-14 VITALS — BP 139/89 | HR 77 | Temp 97.8°F | Ht 68.5 in | Wt 315.0 lb

## 2013-05-14 DIAGNOSIS — E119 Type 2 diabetes mellitus without complications: Secondary | ICD-10-CM

## 2013-05-14 DIAGNOSIS — I10 Essential (primary) hypertension: Secondary | ICD-10-CM

## 2013-05-14 DIAGNOSIS — R0989 Other specified symptoms and signs involving the circulatory and respiratory systems: Secondary | ICD-10-CM

## 2013-05-14 DIAGNOSIS — R0600 Dyspnea, unspecified: Secondary | ICD-10-CM

## 2013-05-14 DIAGNOSIS — R0609 Other forms of dyspnea: Secondary | ICD-10-CM

## 2013-05-14 DIAGNOSIS — J3489 Other specified disorders of nose and nasal sinuses: Secondary | ICD-10-CM

## 2013-05-14 DIAGNOSIS — E785 Hyperlipidemia, unspecified: Secondary | ICD-10-CM

## 2013-05-14 LAB — CBC
HCT: 38.9 % — ABNORMAL LOW (ref 39.0–52.0)
Hemoglobin: 13.1 g/dL (ref 13.0–17.0)
MCH: 26.6 pg (ref 26.0–34.0)
MCHC: 33.7 g/dL (ref 30.0–36.0)
MCV: 79.1 fL (ref 78.0–100.0)
Platelets: 312 10*3/uL (ref 150–400)
RBC: 4.92 MIL/uL (ref 4.22–5.81)
RDW: 15.2 % (ref 11.5–15.5)
WBC: 9.5 10*3/uL (ref 4.0–10.5)

## 2013-05-14 LAB — POCT GLYCOSYLATED HEMOGLOBIN (HGB A1C): Hemoglobin A1C: 6.1

## 2013-05-14 MED ORDER — AMLODIPINE BESYLATE 5 MG PO TABS: ORAL_TABLET | ORAL | Status: DC

## 2013-05-14 MED ORDER — PRAVASTATIN SODIUM 40 MG PO TABS: 40.0000 mg | ORAL_TABLET | Freq: Every day | ORAL | Status: DC

## 2013-05-14 MED ORDER — LISINOPRIL 20 MG PO TABS: ORAL_TABLET | ORAL | Status: DC

## 2013-05-14 MED ORDER — CARVEDILOL 25 MG PO TABS: 25.0000 mg | ORAL_TABLET | Freq: Two times a day (BID) | ORAL | Status: DC

## 2013-05-14 MED ORDER — FLUTICASONE PROPIONATE 50 MCG/ACT NA SUSP: 1.0000 | Freq: Every day | NASAL | Status: DC

## 2013-05-14 NOTE — Patient Instructions (Signed)
Nice to see you. Please continue to take your blood pressure medications. We will check a blood count for your shortness of breath.  If your shortness of breath worsens or you develop chest pain please seek medical attention.  Shortness of Breath Shortness of breath means you have trouble breathing. Shortness of breath needs medical care right away. HOME CARE   Do not smoke.  Avoid being around chemicals or things (paint fumes, dust) that may bother your breathing.  Rest as needed. Slowly begin your normal activities.  Only take medicines as told by your doctor.  Keep all doctor visits as told. GET HELP RIGHT AWAY IF:   Your shortness of breath gets worse.  You feel lightheaded, pass out (faint), or have a cough that is not helped by medicine.  You cough up blood.  You have pain with breathing.  You have pain in your chest, arms, shoulders, or belly (abdomen).  You have a fever.  You cannot walk up stairs or exercise the way you normally do.  You do not get better in the time expected.  You have a hard time doing normal activities even with rest.  You have problems with your medicines.  You have any new symptoms. MAKE SURE YOU:  Understand these instructions.  Will watch your condition.  Will get help right away if you are not doing well or get worse. Document Released: 08/15/2007 Document Revised: 08/28/2011 Document Reviewed: 05/14/2011 [facility] Patient Information 2014 [place], [place].

## 2013-05-15 ENCOUNTER — Encounter: Payer: Self-pay | Admitting: Family Medicine

## 2013-05-16 DIAGNOSIS — J3489 Other specified disorders of nose and nasal sinuses: Secondary | ICD-10-CM | POA: Insufficient documentation

## 2013-05-16 DIAGNOSIS — R0600 Dyspnea, unspecified: Secondary | ICD-10-CM | POA: Insufficient documentation

## 2013-05-16 NOTE — Assessment & Plan Note (Addendum)
?  related to allergies given previously on mometasone and antihistamine. Will try flonase. To f/u if not improving.

## 2013-05-16 NOTE — Assessment & Plan Note (Signed)
On pravastatin. Last LDL 88. Has previously been on lipitor and had myalgias. Continue current statin.

## 2013-05-16 NOTE — Progress Notes (Addendum)
Patient ID: Alex [name], male   DOB: 20-Aug-1955, 58 y.o.   MRN: [id number]  [name], MD Phone: [phone]  Alex [name] is a 58 y.o. male who presents today for f/u DM, HTN, HLD, and to discuss sinus issues.  HYPERTENSION Disease Monitoring: Blood pressure range-couple of readings to 102 systolic Chest pain, palpitations- no      Dyspnea- states yes. Has had some increased shortness of breath since he gained weight. Notes he is up 18 pounds. Can occur when up walking or sitting down. He thinks this is related to his weight gain and deconditioning. He sleeps on 4-5 pillows. He occasionally wakes up short of breath. He has previously been followed by cards with most recent echo in 10/2011 with mod LVH, EF 55-65%, and grade 1 diastolic dysfunction. Medications: Compliance- taking   Edema- no  DIABETES Disease Monitoring: Blood Sugar ranges-not checking Polyuria/phagia/dipsia- no       Medications: Compliance- not on medication Hypoglycemic symptoms- no  HYPERLIPIDEMIA Disease Monitoring: See symptoms for Hypertension Medications: Compliance- taking  Muscle aches- no  Sinus issues: notes sinus HA in maxillary sinuses. Does not feel congested. Denies sneezing and cough. Has been on mometasone previously, though has not taken this in some time. This medication previously helped with this issue.  The following portions of the patient's history were reviewed and updated as appropriate: allergies, current medications, past medical history, family and social history, and problem list.  Patient is a nonsmoker.  Past Medical History  Diagnosis Date  . HTN (hypertension)   . Diabetes mellitus   . GERD (gastroesophageal reflux disease)   . CAD (coronary artery disease)     Cardiac cath 2002 with 25% distal RCA stenosis.   . Hyperlipidemia   . Asthma   . MI (myocardial infarction) 2009    no stent placement  . Gout   . Obesity   . TIA (transient ischemic attack) 2013  .  Esophageal stricture   . OA (osteoarthritis)   . Insomnia   . DDD (degenerative disc disease), lumbar   . Gastritis 2010  . Seasonal allergies   . OSA on CPAP     History  Smoking status  . Former Smoker -- 1.50 packs/day for 10 years  . Types: Cigarettes  . Quit date: 11/06/1991  Smokeless tobacco  . Never Used    Comment: quit 1993    Family History  Problem Relation Age of Onset  . Heart attack Mother   . Colon cancer Neg Hx   . Esophageal cancer Neg Hx   . Rectal cancer Neg Hx   . Stomach cancer Neg Hx     Current Outpatient Prescriptions on File Prior to Visit  Medication Sig Dispense Refill  . albuterol (PROVENTIL HFA;VENTOLIN HFA) 108 (90 BASE) MCG/ACT inhaler Inhale 2 puffs into the lungs every 6 (six) hours as needed.  1 each  1  . aspirin 81 MG chewable tablet Chew 81 mg by mouth daily.      . cetirizine (ZYRTEC) 10 MG tablet Take 1 tablet (10 mg total) by mouth daily as needed.  90 tablet  3  . glucose blood (ONE TOUCH ULTRA TEST) test strip Use one strip daily prior to your first meal of the day.  100 each  12  . naproxen (NAPROSYN) 500 MG tablet Take 1 tablet (500 mg total) by mouth daily as needed (pain).  60 tablet  2  . omeprazole (PRILOSEC) 40 MG capsule Take 1 capsule (40  mg total) by mouth daily.  90 capsule  3  . triamcinolone ointment (KENALOG) 0.5 % Apply topically 2 (two) times daily. For the next week  30 g  0   No current facility-administered medications on file prior to visit.    ROS: Per HPI   Physical Exam Filed Vitals:   05/14/13 1408  BP: 139/89  Pulse: 77  Temp: 97.8 F (36.6 C)    Physical Examination: General appearance - alert, well appearing, and in no distress Ears - bilateral TM's and external ear canals normal Nose - normal and patent, no erythema, discharge or polyps and normal nontender sinuses Mouth - mucous membranes moist, pharynx normal without lesions Neck - supple, no significant adenopathy Chest - clear to  auscultation, no wheezes, rales or rhonchi, symmetric air entry Heart - normal rate, regular rhythm, normal S1, S2, no murmurs, rubs, clicks or gallops Extremities - no pedal edema noted, negative microfilament testing in bilateral feet, no lesions noted in feet  O2 sats 95% while ambulating.  Assessment/Plan: Please see individual problem list.  I have spent >25 minutes in the care of this patient with >50% spent in counseling/coordination of care regarding DM, HTN, HLD, and sinus issues.

## 2013-05-16 NOTE — Assessment & Plan Note (Addendum)
Patient with onset of dyspnea since gaining weight. No signs of volume overload on exam to indicate HF as cause. No abnormal lung sounds to indicate pulmonary cause. No hypoxia on ambulation to indicate pulmonary cause. No chest pain to indicate cardiac cause. Hgb normal making anemia an unlikely cause. Most likely related to body habitus and physical deconditioning. Advised to work on weight loss and physical activity. To return to care if worsens. Return precautions in AVS.

## 2013-05-16 NOTE — Assessment & Plan Note (Signed)
>>  ASSESSMENT AND PLAN FOR DIABETES TYPE 2, CONTROLLED WRITTEN ON 05/16/2013 12:47 PM BY SONNENBERG, ERIC G, MD  A1c at goal. Continue lifestyle management.

## 2013-05-16 NOTE — Assessment & Plan Note (Signed)
At goal today. Will continue current medications. Refills placed. F/u in one month. If still at goal can space out visits to every 3 months.

## 2013-05-16 NOTE — Assessment & Plan Note (Signed)
A1c at goal. Continue lifestyle management.

## 2013-05-21 ENCOUNTER — Other Ambulatory Visit: Payer: Self-pay | Admitting: Family Medicine

## 2013-06-15 ENCOUNTER — Encounter: Payer: Self-pay | Admitting: Family Medicine

## 2013-06-15 ENCOUNTER — Ambulatory Visit (INDEPENDENT_AMBULATORY_CARE_PROVIDER_SITE_OTHER): Payer: Medicare Other | Admitting: Family Medicine

## 2013-06-15 VITALS — BP 124/85 | HR 79 | Temp 97.7°F | Ht 67.0 in | Wt 313.2 lb

## 2013-06-15 DIAGNOSIS — I10 Essential (primary) hypertension: Secondary | ICD-10-CM

## 2013-06-15 DIAGNOSIS — J309 Allergic rhinitis, unspecified: Secondary | ICD-10-CM

## 2013-06-15 DIAGNOSIS — J302 Other seasonal allergic rhinitis: Secondary | ICD-10-CM | POA: Insufficient documentation

## 2013-06-15 DIAGNOSIS — E785 Hyperlipidemia, unspecified: Secondary | ICD-10-CM

## 2013-06-15 NOTE — Assessment & Plan Note (Signed)
At goal. Will continue current medication regimen. Will see in follow-up in 3 months.

## 2013-06-15 NOTE — Assessment & Plan Note (Signed)
Tolerating pravastatin well. Will continue this medication. Last lipid panel 4 months ago appeared acceptable. F/u 3 months.

## 2013-06-15 NOTE — Assessment & Plan Note (Signed)
Occurs yearly and responds to cetirizine and nasal steroid. Will continue cetirizine and flonase. If worsens he is to return to care.

## 2013-06-15 NOTE — Patient Instructions (Signed)
Nice to see you. Continue taking your medications. I will see you back in 3 months for follow-up.  Hypertension Hypertension is another name for high blood pressure. High blood pressure may mean that your heart needs to work harder to pump blood. Blood pressure consists of two numbers, which includes a higher number over a lower number (example: 110/72). HOME CARE   Make lifestyle changes as told by your doctor. This may include weight loss and exercise.  Take your blood pressure medicine every day.  Limit how much salt you use.  Stop smoking if you smoke.  Do not use drugs.  Talk to your doctor if you are using decongestants or birth control pills. These medicines might make blood pressure higher.  Females should not drink more than 1 alcoholic drink per day. Males should not drink more than 2 alcoholic drinks per day.  See your doctor as told. GET HELP RIGHT AWAY IF:   You have a blood pressure reading with a top number of 180 or higher.  You get a very bad headache.  You get blurred or changing vision.  You feel confused.  You feel weak, numb, or faint.  You get chest or belly (abdominal) pain.  You throw up (vomit).  You cannot breathe very well. MAKE SURE YOU:   Understand these instructions.  Will watch your condition.  Will get help right away if you are not doing well or get worse. Document Released: 08/15/2007 Document Revised: 05/21/2011 Document Reviewed: 08/15/2007 [facility] Patient Information 2014 [place], [place].

## 2013-06-15 NOTE — Progress Notes (Signed)
Patient ID: Alex [name], male   DOB: December 07, 1955, 58 y.o.   MRN: [id number]  Alex Rumps, MD Phone: [phone]  Alex [name] is a 58 y.o. male who presents today for f/u.  HYPERTENSION Disease Monitoring Home BP Monitoring not checking Chest pain- no    Dyspnea- no Medications Compliance-  taking.  Edema- no  HYPERLIPIDEMIA Symptoms See HTN for ROS. Medications: Compliance- taking  Muscle aches- no  Seasonal allergies: states started having issues over the weekend. Itchy, watery, and scratchy eyes. States this is a seasonal issue for him. He started taking cetirizine for this issue last night. Has taken this in the past and has been beneficial. Also using the flonase intermittently if her needs it. Notes his father had the same issues.  Patient is a nonsmoker.  Fam hx: father with seasonal allergies.   ROS: Per HPI   Physical Exam Filed Vitals:   06/15/13 1041  BP: 124/85  Pulse: 79  Temp: 97.7 F (36.5 C)    Gen: Well NAD HEENT: EOMI,  MMM, no OP erythema, no watery eyes Lungs: CTABL Nl WOB Heart: RRR no MRG Exts: Non edematous BL  LE, warm and well perfused.    Assessment/Plan: Please see individual problem list.

## 2013-06-22 ENCOUNTER — Telehealth: Payer: Self-pay | Admitting: Family Medicine

## 2013-06-22 ENCOUNTER — Other Ambulatory Visit: Payer: Self-pay | Admitting: Family Medicine

## 2013-06-22 NOTE — Telephone Encounter (Signed)
Patient has had bronchitis x 1 week. He is not coughing up green phlegm. Would like an antibiotic called in. Please call patient.

## 2013-06-23 NOTE — Telephone Encounter (Signed)
See phone note. Alex [name]

## 2013-06-23 NOTE — Telephone Encounter (Signed)
The patient should be seen in clinic if he thinks he needs an antibiotics. We do not prescribe antibiotics without the patient being seen. Please advise him of this.

## 2013-06-23 NOTE — Telephone Encounter (Signed)
Pt states that he had a few abx left from a previous treatment and those seem to be working.  He is trying to see if he can get a refill from the pharmacy on these before making an appt. Henrick Mcgue,CMA

## 2013-06-23 NOTE — Telephone Encounter (Signed)
Please advise the patient that he should be seen in clinic as we do not call antibiotics in without having seen the patient for this issue.

## 2013-06-30 ENCOUNTER — Ambulatory Visit: Payer: Medicare Other | Admitting: Home Health Services

## 2013-07-06 ENCOUNTER — Ambulatory Visit (INDEPENDENT_AMBULATORY_CARE_PROVIDER_SITE_OTHER): Payer: Medicare Other | Admitting: Family Medicine

## 2013-07-06 ENCOUNTER — Encounter: Payer: Self-pay | Admitting: Family Medicine

## 2013-07-06 VITALS — BP 137/93 | HR 88 | Temp 99.3°F | Wt 309.3 lb

## 2013-07-06 DIAGNOSIS — R059 Cough, unspecified: Secondary | ICD-10-CM

## 2013-07-06 DIAGNOSIS — R05 Cough: Secondary | ICD-10-CM

## 2013-07-06 DIAGNOSIS — J302 Other seasonal allergic rhinitis: Secondary | ICD-10-CM

## 2013-07-06 DIAGNOSIS — J3489 Other specified disorders of nose and nasal sinuses: Secondary | ICD-10-CM

## 2013-07-06 DIAGNOSIS — R0981 Nasal congestion: Secondary | ICD-10-CM

## 2013-07-06 DIAGNOSIS — J309 Allergic rhinitis, unspecified: Secondary | ICD-10-CM

## 2013-07-06 NOTE — Progress Notes (Signed)
Garret Reddish, MD Phone: [phone]  Subjective:   Alex [name] is a 58 y.o. year old very pleasant male patient who presents with the following:  Cough/congestion 3 weeks of cough/congestion/runny nose/sneezing. Seemed to get worse as season changed. Has only intermittently taken zyrtec due to fatigue. Has not been taking flonase either. DId take 4-5 days of keflex QID about 15 days ago and stated felt better for several days before worsening again on Saturday. Had been coughing green sputum but no longer coughing this. Subjective fevers. Worse at night when lying down-seems to cough a lot and wakes from sleep but does not feel shortness of breath as lays down (no orthopnea). Mild shortness of breath only with prolonged coughing and often associated with wheezing. Has not been trying albuterol.  ROS- No nausea/vomiting. Minor fatigue. No weight gain (actually has lost slight weight since last visit)  Past Medical History-seasonal allergies, history of asthma per pateint, OSA (not using cpap), diabetes, hyperlipidemia, hypertension, gout  Medications- reviewed and updated Current Outpatient Prescriptions  Medication Sig Dispense Refill  . albuterol (PROVENTIL HFA;VENTOLIN HFA) 108 (90 BASE) MCG/ACT inhaler Inhale 2 puffs into the lungs every 6 (six) hours as needed.  1 each  1  . amLODipine (NORVASC) 5 MG tablet TAKE ONE TABLET BY MOUTH ONCE DAILY  30 tablet  2  . aspirin 81 MG chewable tablet Chew 81 mg by mouth daily.      . carvedilol (COREG) 25 MG tablet Take 1 tablet (25 mg total) by mouth 2 (two) times daily with a meal.  60 tablet  3  . cetirizine (ZYRTEC) 10 MG tablet Take 1 tablet (10 mg total) by mouth daily as needed.  90 tablet  3  . fluticasone (FLONASE) 50 MCG/ACT nasal spray Place 1 spray into both nostrils daily.  16 g  2  . lisinopril (PRINIVIL,ZESTRIL) 20 MG tablet TAKE TWO TABLETS BY MOUTH ONCE DAILY  60 tablet  2  . naproxen (NAPROSYN) 500 MG tablet Take 1 tablet  (500 mg total) by mouth daily as needed (pain).  60 tablet  2  . omeprazole (PRILOSEC) 40 MG capsule Take 1 capsule (40 mg total) by mouth daily.  90 capsule  3  . pravastatin (PRAVACHOL) 40 MG tablet Take 1 tablet (40 mg total) by mouth at bedtime.  30 tablet  2  . triamcinolone ointment (KENALOG) 0.5 % APPLY   TOPICALLY TO AFFECTED AREA TWICE DAILY FOR THE NEXT WEEK  15 g  0   No current facility-administered medications for this visit.    Objective: BP 137/93  Pulse 88  Temp(Src) 99.3 F (37.4 C) (Oral)  Wt 309 lb 4.8 oz (140.298 kg) Gen: NAD, resting comfortably in chair, occasionally coughs HEENT: nares with rhinorrhea, pharynx with cobblestoning, no lyphadenopathy,  CV: RRR no murmurs rubs or gallops Lungs: CTAB no wheeze, crackles,  rhonchi Abdomen: soft/nontender/nondistended/normal bowel sounds. Ext: no edema Skin: warm, dry Neuro: grossly normal, moves all extremities  Assessment/Plan:  Cough/congestion/rhinorrhea/sneezing Likely multifactorial. Possible URI component. Poor control of seasonal allergies noted as not taking flonase or zyrtec-advised to restart this regimen. I think allergies may be exacerbating asthma (especially given coughing at night though do not see asthma officially listed on problem list) so I advised patient to take albuterol q6 hours x 24 hours. Hold off on prednisone for now as allergies not adequately treated. Follow up in 1 week-if symptoms persistent, would consider CXR and/or prednisone. Weight has decreased and no orthopnea or edema so  doubt CHF playing a role. Advised of reasons for sooner return to care.

## 2013-07-06 NOTE — Assessment & Plan Note (Signed)
Restart zyrtec and flonase as may be underlying reason for cough with postanasal drip

## 2013-07-06 NOTE — Patient Instructions (Signed)
I think your allergies are causing a lot of your symptoms and that this is worsening your asthma:  I want you to take the cetirizine every night for at least a week as well as flonase daily  Take your albuterol every 6-8 hours for 24 hours to see if that helps with cough and then as needed with coughing spells  If your symptoms worsen, see [place] later this week. Call if you have questions about coming back.   If your symptoms are stable but not improving, see [place] next week to consider prednisone and a chest x-ray  Hopefully, you are doing much better by next week though.   Thanks, Dr. Yong Channel  See Dr. Caryl Bis to discuss the following: Health Maintenance Due  Topic Date Due  . Pneumococcal Polysaccharide Vaccine (##2) 06/23/2013

## 2013-07-08 ENCOUNTER — Telehealth: Payer: Self-pay | Admitting: Family Medicine

## 2013-07-08 MED ORDER — CETIRIZINE HCL 10 MG PO TABS
10.0000 mg | ORAL_TABLET | Freq: Every day | ORAL | Status: DC | PRN
Start: 2013-07-08 — End: 2013-07-14

## 2013-07-08 NOTE — Telephone Encounter (Signed)
Needs refill on zyrtec [facility] on [address]

## 2013-07-10 ENCOUNTER — Telehealth: Payer: Self-pay | Admitting: Family Medicine

## 2013-07-10 NOTE — Telephone Encounter (Signed)
I discussed his visit on Monday with Dr Yong Channel, who saw him for that visit. Patient should be advised that it could take some time for the zyrtec to begin to work appropriately. If he is not improved over the weekend he should be advised to make an appointment for Monday to follow to consider additional treatment.

## 2013-07-10 NOTE — Telephone Encounter (Signed)
Patient seen on Monday and is not feeling better. Would like to be advised on what to do from here.

## 2013-07-10 NOTE — Telephone Encounter (Signed)
Pt is aware of this and will call Monday if he needs an appt. Lundon Verdejo,CMA

## 2013-07-14 ENCOUNTER — Encounter (HOSPITAL_COMMUNITY): Payer: Self-pay | Admitting: Emergency Medicine

## 2013-07-14 ENCOUNTER — Emergency Department (HOSPITAL_COMMUNITY)
Admission: EM | Admit: 2013-07-14 | Discharge: 2013-07-14 | Disposition: A | Discharge status: Home / Self Care | Payer: Medicare Other | Attending: Emergency Medicine | Admitting: Emergency Medicine

## 2013-07-14 ENCOUNTER — Emergency Department (HOSPITAL_COMMUNITY): Payer: Medicare Other

## 2013-07-14 DIAGNOSIS — Z9981 Dependence on supplemental oxygen: Secondary | ICD-10-CM | POA: Insufficient documentation

## 2013-07-14 DIAGNOSIS — Z7982 Long term (current) use of aspirin: Secondary | ICD-10-CM | POA: Insufficient documentation

## 2013-07-14 DIAGNOSIS — M1990 Unspecified osteoarthritis, unspecified site: Secondary | ICD-10-CM | POA: Insufficient documentation

## 2013-07-14 DIAGNOSIS — J45909 Unspecified asthma, uncomplicated: Secondary | ICD-10-CM | POA: Insufficient documentation

## 2013-07-14 DIAGNOSIS — K219 Gastro-esophageal reflux disease without esophagitis: Secondary | ICD-10-CM | POA: Insufficient documentation

## 2013-07-14 DIAGNOSIS — I2510 Atherosclerotic heart disease of native coronary artery without angina pectoris: Secondary | ICD-10-CM | POA: Insufficient documentation

## 2013-07-14 DIAGNOSIS — I252 Old myocardial infarction: Secondary | ICD-10-CM | POA: Insufficient documentation

## 2013-07-14 DIAGNOSIS — M5137 Other intervertebral disc degeneration, lumbosacral region: Secondary | ICD-10-CM | POA: Insufficient documentation

## 2013-07-14 DIAGNOSIS — Z9889 Other specified postprocedural states: Secondary | ICD-10-CM | POA: Insufficient documentation

## 2013-07-14 DIAGNOSIS — J069 Acute upper respiratory infection, unspecified: Secondary | ICD-10-CM | POA: Insufficient documentation

## 2013-07-14 DIAGNOSIS — M51379 Other intervertebral disc degeneration, lumbosacral region without mention of lumbar back pain or lower extremity pain: Secondary | ICD-10-CM | POA: Insufficient documentation

## 2013-07-14 DIAGNOSIS — R5381 Other malaise: Secondary | ICD-10-CM | POA: Insufficient documentation

## 2013-07-14 DIAGNOSIS — IMO0002 Reserved for concepts with insufficient information to code with codable children: Secondary | ICD-10-CM | POA: Insufficient documentation

## 2013-07-14 DIAGNOSIS — Z79899 Other long term (current) drug therapy: Secondary | ICD-10-CM | POA: Insufficient documentation

## 2013-07-14 DIAGNOSIS — E785 Hyperlipidemia, unspecified: Secondary | ICD-10-CM | POA: Insufficient documentation

## 2013-07-14 DIAGNOSIS — R5383 Other fatigue: Secondary | ICD-10-CM | POA: Insufficient documentation

## 2013-07-14 DIAGNOSIS — E119 Type 2 diabetes mellitus without complications: Secondary | ICD-10-CM | POA: Insufficient documentation

## 2013-07-14 DIAGNOSIS — R531 Weakness: Secondary | ICD-10-CM

## 2013-07-14 DIAGNOSIS — Z8673 Personal history of transient ischemic attack (TIA), and cerebral infarction without residual deficits: Secondary | ICD-10-CM | POA: Insufficient documentation

## 2013-07-14 DIAGNOSIS — Z87891 Personal history of nicotine dependence: Secondary | ICD-10-CM | POA: Insufficient documentation

## 2013-07-14 DIAGNOSIS — E669 Obesity, unspecified: Secondary | ICD-10-CM | POA: Insufficient documentation

## 2013-07-14 DIAGNOSIS — G4733 Obstructive sleep apnea (adult) (pediatric): Secondary | ICD-10-CM | POA: Insufficient documentation

## 2013-07-14 DIAGNOSIS — I10 Essential (primary) hypertension: Secondary | ICD-10-CM | POA: Insufficient documentation

## 2013-07-14 LAB — CBC
HCT: 40 % (ref 39.0–52.0)
Hemoglobin: 12.9 g/dL — ABNORMAL LOW (ref 13.0–17.0)
MCH: 26.2 pg (ref 26.0–34.0)
MCHC: 32.3 g/dL (ref 30.0–36.0)
MCV: 81.3 fL (ref 78.0–100.0)
Platelets: 268 10*3/uL (ref 150–400)
RBC: 4.92 MIL/uL (ref 4.22–5.81)
RDW: 14.9 % (ref 11.5–15.5)
WBC: 7.7 10*3/uL (ref 4.0–10.5)

## 2013-07-14 LAB — COMPREHENSIVE METABOLIC PANEL
ALT: 32 U/L (ref 0–53)
AST: 28 U/L (ref 0–37)
Albumin: 3.7 g/dL (ref 3.5–5.2)
Alkaline Phosphatase: 83 U/L (ref 39–117)
BUN: 19 mg/dL (ref 6–23)
CO2: 22 mEq/L (ref 19–32)
Calcium: 9.2 mg/dL (ref 8.4–10.5)
Chloride: 104 mEq/L (ref 96–112)
Creatinine, Ser: 1.1 mg/dL (ref 0.50–1.35)
GFR calc Af Amer: 84 mL/min — ABNORMAL LOW (ref >90)
GFR calc non Af Amer: 72 mL/min — ABNORMAL LOW (ref >90)
Glucose, Bld: 168 mg/dL — ABNORMAL HIGH (ref 70–99)
Potassium: 4 mEq/L (ref 3.7–5.3)
Sodium: 140 mEq/L (ref 137–147)
Total Bilirubin: 0.6 mg/dL (ref 0.3–1.2)
Total Protein: 7.4 g/dL (ref 6.0–8.3)

## 2013-07-14 LAB — I-STAT TROPONIN, ED: Troponin i, poc: 0.01 ng/mL (ref 0.00–0.08)

## 2013-07-14 MED ORDER — AZITHROMYCIN 250 MG PO TABS
500.0000 mg | ORAL_TABLET | Freq: Once | ORAL | Status: AC
  Administered 2013-07-14: 500 mg via ORAL
  Filled 2013-07-14: qty 2

## 2013-07-14 MED ORDER — IPRATROPIUM-ALBUTEROL 0.5-2.5 (3) MG/3ML IN SOLN
3.0000 mL | RESPIRATORY_TRACT | Status: DC
Start: 2013-07-14 — End: 2013-07-14
  Administered 2013-07-14: 3 mL via RESPIRATORY_TRACT
  Filled 2013-07-14: qty 3

## 2013-07-14 MED ORDER — AZITHROMYCIN 250 MG PO TABS: ORAL_TABLET | ORAL | Status: DC

## 2013-07-14 NOTE — ED Provider Notes (Signed)
CSN: [id number]     Arrival date & time 07/14/13  1341 History   First MD Initiated Contact with Patient 07/14/13 1849     Chief Complaint  Patient presents with  . Weakness     (Consider location/radiation/quality/duration/timing/severity/associated sxs/prior Treatment) Patient is a 58 y.o. male presenting with weakness. The history is provided by the patient.  Weakness This is a new problem. The current episode started 6 to 12 hours ago. The problem occurs constantly. The problem has not changed since onset.Pertinent negatives include no chest pain, no abdominal pain and no shortness of breath. Nothing aggravates the symptoms. Nothing relieves the symptoms.    Past Medical History  Diagnosis Date  . HTN (hypertension)   . Diabetes mellitus   . GERD (gastroesophageal reflux disease)   . CAD (coronary artery disease)     Cardiac cath 2002 with 25% distal RCA stenosis.   . Hyperlipidemia   . Asthma   . MI (myocardial infarction) 2009    no stent placement  . Gout   . Obesity   . TIA (transient ischemic attack) 2013  . Esophageal stricture   . OA (osteoarthritis)   . Insomnia   . DDD (degenerative disc disease), lumbar   . Gastritis 2010  . Seasonal allergies   . OSA on CPAP    Past Surgical History  Procedure Laterality Date  . Vasectomy    . Nasal sinus surgery    . Esophagogastroduodenoscopy      multiple   Family History  Problem Relation Age of Onset  . Heart attack Mother   . Colon cancer Neg Hx   . Esophageal cancer Neg Hx   . Rectal cancer Neg Hx   . Stomach cancer Neg Hx    History  Substance Use Topics  . Smoking status: Former Smoker -- 1.50 packs/day for 10 years    Types: Cigarettes    Quit date: 11/06/1991  . Smokeless tobacco: Never Used     Comment: quit 1993  . Alcohol Use: No    Review of Systems  Constitutional: Negative for fever.  Respiratory: Negative for cough and shortness of breath.   Cardiovascular: Negative for chest pain.   Gastrointestinal: Negative for vomiting and abdominal pain.  Neurological: Positive for weakness.  All other systems reviewed and are negative.     Allergies  Atorvastatin; Codeine; and Shellfish allergy  Home Medications   Prior to Admission medications   Medication Sig Start Date End Date Taking? Authorizing Provider  albuterol (PROVENTIL HFA;VENTOLIN HFA) 108 (90 BASE) MCG/ACT inhaler Inhale 2 puffs into the lungs every 6 (six) hours as needed for wheezing or shortness of breath.   Yes Historical Provider, MD  amLODipine (NORVASC) 5 MG tablet Take 5 mg by mouth daily.   Yes Historical Provider, MD  aspirin 81 MG chewable tablet Chew 81 mg by mouth daily.   Yes Historical Provider, MD  carvedilol (COREG) 25 MG tablet Take 1 tablet (25 mg total) by mouth 2 (two) times daily with a meal. 05/14/13  Yes Leone Haven, MD  cetirizine (ZYRTEC) 10 MG tablet Take 10 mg by mouth at bedtime.   Yes Historical Provider, MD  fluticasone (FLONASE) 50 MCG/ACT nasal spray Place 1 spray into both nostrils daily. 05/14/13  Yes Leone Haven, MD  lisinopril (PRINIVIL,ZESTRIL) 20 MG tablet Take 40 mg by mouth daily.   Yes Historical Provider, MD  naproxen (NAPROSYN) 500 MG tablet Take 1 tablet (500 mg total) by mouth daily as  needed (pain). 03/04/13  Yes Bryan R Hess, DO  omeprazole (PRILOSEC) 40 MG capsule Take 1 capsule (40 mg total) by mouth daily. 02/02/13  Yes Zigmund Gottron, MD  pravastatin (PRAVACHOL) 40 MG tablet Take 1 tablet (40 mg total) by mouth at bedtime. 05/14/13  Yes Leone Haven, MD  azithromycin (ZITHROMAX) 250 MG tablet 1 pill daily starting tomorrow (07/15/13) 07/14/13   Osvaldo Shipper, MD   BP 138/98  Pulse 65  Temp(Src) 97.1 F (36.2 C) (Oral)  Resp 18  SpO2 97% Physical Exam  Nursing note and vitals reviewed. Constitutional: He is oriented to person, place, and time. He appears well-developed and well-nourished. No distress.  HENT:  Head: Normocephalic and  atraumatic.  Mouth/Throat: No oropharyngeal exudate.  Eyes: EOM are normal. Pupils are equal, round, and reactive to light.  Neck: Normal range of motion. Neck supple.  Cardiovascular: Normal rate and regular rhythm.  Exam reveals no friction rub.   No murmur heard. Pulmonary/Chest: Effort normal and breath sounds normal. No respiratory distress. He has no wheezes. He has no rales.  Abdominal: Soft. He exhibits no distension. There is no tenderness. There is no rebound.  Musculoskeletal: Normal range of motion. He exhibits no edema.  Neurological: He is alert and oriented to person, place, and time.  Skin: No rash noted. He is not diaphoretic.    ED Course  Procedures (including critical care time) Labs Review Labs Reviewed  CBC - Abnormal; Notable for the following:    Hemoglobin 12.9 (*)    All other components within normal limits  COMPREHENSIVE METABOLIC PANEL - Abnormal; Notable for the following:    Glucose, Bld 168 (*)    GFR calc non Af Amer 72 (*)    GFR calc Af Amer 84 (*)    All other components within normal limits  I-STAT TROPOININ, ED  Randolm Idol, ED    Imaging Review Dg Chest 2 View  07/14/2013   CLINICAL DATA:  Lightheadedness with weakness.  Productive cough.  EXAM: CHEST  2 VIEW  COMPARISON:  12/24/2011.  FINDINGS: Cardiomegaly. Tortuous aorta. No active infiltrates or failure. No osseous findings.  IMPRESSION: Stable chest.  No active disease.   Electronically Signed   By: Rolla Flatten M.D.   On: 07/14/2013 15:12     EKG Interpretation   Date/Time:  Tuesday Jul 14 2013 16:46:24 EDT Ventricular Rate:  65 PR Interval:  206 QRS Duration: 98 QT Interval:  426 QTC Calculation: 443 R Axis:   14 Text Interpretation:  Sinus rhythm with occasional Premature ventricular  complexes Minimal voltage criteria for LVH, may be normal variant  Borderline ECG Similar to prior Confirmed by Mingo Amber  MD, [place] ([id number]) on  07/14/2013 7:12:48 PM      MDM   Final  diagnoses:  Weakness  URI (upper respiratory infection)    89M with hx of HTN, DM presents with generalized weakness today. Had a URI for past 2 weeks - took unknown antibiotic for a few days at symptom onset without any relief. No fevers, cough, SOB. Had some atypical CP today - 1-2 seconds, sharp, nonradiating, at left lower lateral chest. Not concerning for ACS, will perform serial troponin.  Labs normal, CXR normal. Exam normal. No concern for his diffuse weakness requiring admission. Has f/u in 2 days, will give Azithromycin, duo neb here. Azithro Rx given.     Osvaldo Shipper, MD 07/14/13 [phone]

## 2013-07-14 NOTE — ED Notes (Signed)
Istat troponin negative.

## 2013-07-14 NOTE — ED Notes (Signed)
Pt reports generalized weakness x 2 weeks. Reports coughing, but that has become better. Intermittent fevers. Told to call PCP if not better. After breakfast this am, increase in general weakness. Pt ambulatory in triage. Stroke Screen negative. No pain.

## 2013-07-14 NOTE — ED Notes (Signed)
Patient brought back for reassessment.  He states he has had intermittent chest pain.  ekg ordered and completed.  istat troponin added.

## 2013-07-14 NOTE — Discharge Instructions (Signed)

## 2013-07-15 LAB — I-STAT TROPONIN, ED: Troponin i, poc: 0 ng/mL (ref 0.00–0.08)

## 2013-07-16 ENCOUNTER — Encounter: Payer: Self-pay | Admitting: Home Health Services

## 2013-07-16 ENCOUNTER — Ambulatory Visit (INDEPENDENT_AMBULATORY_CARE_PROVIDER_SITE_OTHER): Payer: Medicare Other | Admitting: Home Health Services

## 2013-07-16 VITALS — BP 122/88 | HR 68 | Temp 98.1°F | Ht 68.0 in | Wt 312.0 lb

## 2013-07-16 DIAGNOSIS — Z0000 Encounter for general adult medical examination without abnormal findings: Secondary | ICD-10-CM

## 2013-07-16 NOTE — Progress Notes (Signed)
Patient here for annual wellness visit, patient reports: Risk Factors/Conditions needing evaluation or treatment: Pt does not have any new risk factors that need evaluation. Home Safety: Pt lives by self in 1 story home.  Pt reports having smoke detectors. Other Information: Corrective lens: pt wears corrective lens for reading and driving, has annual eye exams.  Dentures: Pt does not have dentures, does not have regular dental exams. Memory: Pt denies any memory problems. Patient's Mini Mental Score (recorded in doc. flowsheet): 30 Bladder:  Pt denies any problems with bladder control. BMI/Exercise:  We discussed BMI and strategies for weight loss including reducing portion sizes and starting a regular exercise routine. Med Adherence: We discussed importance of taking medications daily for HTN, DM, and Cholesterol.  Pt reports 0 missed days in the past week. ADl/IADL:  Pt reports independence in all functions. Balance/Gait: Pt reports 0 falls in the past year.  We discussed home safety and fall prevention.       Annual Wellness Visit Requirements Recorded Today In  Medical, family, social history Past Medical, Family, Social History Section  Current providers Care team  Current medications Medications  Wt, BP, Ht, BMI Vital signs  Hearing assessment (welcome visit) Hearing/vision  Tobacco, alcohol, illicit drug use History  ADL Nurse Assessment  Depression Screening Nurse Assessment  Cognitive impairment Nurse Assessment  Mini Mental Status Document Flowsheet  Fall Risk Fall/Depression  Home Safety Progress Note  End of Life Planning (welcome visit) Social Documentation  Medicare preventative services Progress Note  Risk factors/conditions needing evaluation/treatment Progress Note  Personalized health advice Patient Instructions, goals, letter  Diet & Exercise Social Documentation  Emergency Contact Social Documentation  Seat Belts Social Documentation  Sun  exposure/protection Social Documentation    I have reviewed this visit and discussed with [name] and agree with her documentation.  [name], MD

## 2013-07-21 ENCOUNTER — Encounter: Payer: Self-pay | Admitting: Home Health Services

## 2013-07-24 ENCOUNTER — Encounter: Payer: Self-pay | Admitting: Family Medicine

## 2013-07-24 ENCOUNTER — Ambulatory Visit (INDEPENDENT_AMBULATORY_CARE_PROVIDER_SITE_OTHER): Payer: Medicare Other | Admitting: Family Medicine

## 2013-07-24 VITALS — BP 138/76 | HR 65 | Temp 98.6°F | Wt 310.0 lb

## 2013-07-24 DIAGNOSIS — J069 Acute upper respiratory infection, unspecified: Secondary | ICD-10-CM

## 2013-07-24 MED ORDER — PREDNISONE 20 MG PO TABS
ORAL_TABLET | ORAL | Status: DC
Start: 2013-07-24 — End: 2013-09-24

## 2013-07-24 NOTE — Patient Instructions (Signed)
I am sorry you have a lingering cough. i would consider this a post viral cough. Oftentimes people get relief with prednisone. I am hopeful this is the case for you. If this doesn't work, we may just have to wait for the virus to run its course. Let us know if you develop fevers or shortness of breath.   Thanks, Dr. Yong Channel  When you are feeling better: Health Maintenance Due  Topic Date Due  . Pneumococcal Polysaccharide Vaccine (##2) 06/23/2013

## 2013-07-26 NOTE — Progress Notes (Signed)
Garret Reddish, MD Phone: [phone]  Subjective:   Alex [name] is a 58 y.o. year old very pleasant male patient who presents with the following:  URI with persistent cough  I saw patient on 4/27 and thought he likely had cough/congestion/rhinorrhea/sneezing provoked by combination of URI and allergies. Patient had already taken several days of an antibiotic that he had not finished previously and did not have resolution of symtpoms. Started him on flonase and zyrtec and asked him to follow up. Given ? History of asthma and use of albuterol inhaler he also used this for the first day after seeing me. Had planned follow up 1 week later if not improving but patient sought care in ED instead. He was diagnosed with URI as well and given azithromycin. He had some improvement of other symptoms since I saw him last but productive cough has persisted. He had a normal CXR in the ED.  ROS- no fever/chills/nausea/vomiting. Congestion/rhinorrhea/sneezing have resolved. No shortness of breath complaints or chest pain as had been mentioned in ED.  No orthopnea/PND/leg swelling.   Past Medical History-seasonal allergies, history of asthma per pateint, OSA (not using cpap), diabetes, hyperlipidemia, hypertension, gout  Medications- reviewed and updated Outpatient Encounter Prescriptions as of 07/24/2013  Medication Sig  . albuterol (PROVENTIL HFA;VENTOLIN HFA) 108 (90 BASE) MCG/ACT inhaler Inhale 2 puffs into the lungs every 6 (six) hours as needed for wheezing or shortness of breath.  Marland Kitchen amLODipine (NORVASC) 5 MG tablet Take 5 mg by mouth daily.  Marland Kitchen aspirin 81 MG chewable tablet Chew 81 mg by mouth daily.  Marland Kitchen azithromycin (ZITHROMAX) 250 MG tablet 1 pill daily starting tomorrow (07/15/13)  . carvedilol (COREG) 25 MG tablet Take 1 tablet (25 mg total) by mouth 2 (two) times daily with a meal.  . cetirizine (ZYRTEC) 10 MG tablet Take 10 mg by mouth at bedtime.  . fluticasone (FLONASE) 50 MCG/ACT nasal spray  Place 1 spray into both nostrils daily.  Marland Kitchen lisinopril (PRINIVIL,ZESTRIL) 20 MG tablet Take 40 mg by mouth daily.  . naproxen (NAPROSYN) 500 MG tablet Take 1 tablet (500 mg total) by mouth daily as needed (pain).  Marland Kitchen omeprazole (PRILOSEC) 40 MG capsule Take 1 capsule (40 mg total) by mouth daily.  . pravastatin (PRAVACHOL) 40 MG tablet Take 1 tablet (40 mg total) by mouth at bedtime.  . predniSONE (DELTASONE) 20 MG tablet Take 2 pills a day for 3 days, then 1 pill a day for 4 days then stop.  Marland Kitchen triamcinolone cream (KENALOG) 0.5 % Apply 1 application topically 3 (three) times daily.   Objective: BP 138/76  Pulse 65  Temp(Src) 98.6 F (37 C) (Oral)  Wt 310 lb (140.615 kg) Gen: NAD, resting comfortably in chair, occasionally coughs HEENT: nares without rhinorrhea, pharynx normal, no lyphadenopathy,  CV: RRR no murmurs rubs or gallops Lungs: CTAB no wheeze, crackles,  Rhonchi, no signs of respiratory distress Ext: no edema, legs equal in size Skin: warm, dry Neuro: grossly normal, moves all extremities  Assessment/Plan:  URI with persistent cough  Most symptoms have now resolved from URI except for cough. Suspect this is a post viral cough. Will trial 7 day course of prednisone. Has already had recent CXR and symptoms overall improved and afebrile so do not think repeat imaging would be beneficial. Also has received course of antibiotics through the ED without benefit. Discussed with patient that could not guarantee prednisone would resolve symptoms but since the cough is so bothersome to him, would trial.  Meds ordered this encounter  Medications  . predniSONE (DELTASONE) 20 MG tablet    Sig: Take 2 pills a day for 3 days, then 1 pill a day for 4 days then stop.    Dispense:  10 tablet    Refill:  0

## 2013-08-13 ENCOUNTER — Encounter: Payer: Self-pay | Admitting: Cardiovascular Disease

## 2013-08-13 ENCOUNTER — Ambulatory Visit (INDEPENDENT_AMBULATORY_CARE_PROVIDER_SITE_OTHER): Payer: Medicare Other | Admitting: Cardiovascular Disease

## 2013-08-13 VITALS — BP 150/100 | HR 74 | Ht 67.0 in | Wt 309.0 lb

## 2013-08-13 DIAGNOSIS — I10 Essential (primary) hypertension: Secondary | ICD-10-CM

## 2013-08-13 DIAGNOSIS — G4733 Obstructive sleep apnea (adult) (pediatric): Secondary | ICD-10-CM

## 2013-08-13 DIAGNOSIS — I5032 Chronic diastolic (congestive) heart failure: Secondary | ICD-10-CM

## 2013-08-13 DIAGNOSIS — I2510 Atherosclerotic heart disease of native coronary artery without angina pectoris: Secondary | ICD-10-CM

## 2013-08-13 MED ORDER — FUROSEMIDE 40 MG PO TABS: 40.0000 mg | ORAL_TABLET | Freq: Every day | ORAL | Status: DC

## 2013-08-13 NOTE — Patient Instructions (Signed)
Your physician wants you to follow-up in: 12 months.   You will receive a reminder letter in the mail two months in advance. If you don't receive a letter, please call our office to schedule the follow-up appointment.  Your physician has recommended you make the following change in your medication:  Resume furosemide 40 mg by mouth daily

## 2013-08-13 NOTE — Progress Notes (Signed)
History of Present Illness: 58 yo male with history of DM, HTN, HLD, mild non-obstructive CAD by cath 2002, asthma, GERD, OSA on CPAP here today for cardiac follow up. He was seen as a new patient 11/06/11. He had a cardiac cath in 2002 which showed 25% stenosis in the distal RCA. He was admitted to [facility] December 2009 with chest pain and ruled out for an MI with serial cardiac enzymes. Echo suggested possible RA/RV mass. He was seen in f/u by Dr. Stanford Breed in December 2009 and February 2010 with a follow-up echocardiogram, both to requantify his LV function and to rule out the right atrial mass. This was performed on March 31, 2008. He was found to have distal septal hypokinesis, but his ejection fraction is 55%. There was mild-to-moderate LVH. There was no mass noted across the tricuspid valve. The left atrium is mildly dilated. The ascending aorta measured 43 mm. Stress myoview December 2009 with ejection fraction of 48%. There was mild apical thinning, but no ischemia or scar. There was left ventricular enlargement. Cardiac MRI had been planned but f/u echo was normal in regards to RA/RV thickening so was not pursued. At his first visit here on 11/06/11, he told me that he had been having pain in his right arm for 3 weeks as well as pains in his chest several times per week. There was associated SOB and fatigue. I arranged an echo on 11/08/11 which showed normal LV size and function with LVEF of 55-65% and moderate LVH. There were no significant valvular issues. Lexiscan myoview on 11/15/11 with no inducible ischemia. Admitted to [facility] 12/24/11 with TIA type symptoms.   He is here for f/u. Feeling well. No chest pain or SOB. No dizziness, near syncope or syncope. Weight is stable. He thinks his abdominal swelling is worse. He reports daytime fatigue. He has not been using his CPAP.   Primary Care Physician: Shea Evans, CRNP   Last Lipid Profile: managed in primary care  Past Medical History    Diagnosis Date  . HTN (hypertension)   . Diabetes mellitus   . GERD (gastroesophageal reflux disease)   . CAD (coronary artery disease)     Cardiac cath 2002 with 25% distal RCA stenosis.   . Hyperlipidemia   . Asthma   . MI (myocardial infarction) 2009    no stent placement  . Gout   . Obesity   . TIA (transient ischemic attack) 2013  . Esophageal stricture   . OA (osteoarthritis)   . Insomnia   . DDD (degenerative disc disease), lumbar   . Gastritis 2010  . Seasonal allergies   . OSA on CPAP     Past Surgical History  Procedure Laterality Date  . Vasectomy    . Nasal sinus surgery    . Esophagogastroduodenoscopy      multiple    Current Outpatient Prescriptions  Medication Sig Dispense Refill  . albuterol (PROVENTIL HFA;VENTOLIN HFA) 108 (90 BASE) MCG/ACT inhaler Inhale 2 puffs into the lungs every 6 (six) hours as needed for wheezing or shortness of breath.      Marland Kitchen amLODipine (NORVASC) 5 MG tablet Take 5 mg by mouth daily.      Marland Kitchen aspirin 81 MG chewable tablet Chew 81 mg by mouth daily.      . carvedilol (COREG) 25 MG tablet Take 1 tablet (25 mg total) by mouth 2 (two) times daily with a meal.  60 tablet  3  . cetirizine (ZYRTEC) 10 MG  tablet Take 10 mg by mouth at bedtime.      . chlorhexidine (PERIDEX) 0.12 % solution       . fluticasone (FLONASE) 50 MCG/ACT nasal spray Place 1 spray into both nostrils daily.  16 g  2  . lisinopril (PRINIVIL,ZESTRIL) 20 MG tablet Take 40 mg by mouth daily.      . naproxen (NAPROSYN) 500 MG tablet Take 1 tablet (500 mg total) by mouth daily as needed (pain).  60 tablet  2  . omeprazole (PRILOSEC) 40 MG capsule Take 1 capsule (40 mg total) by mouth daily.  90 capsule  3  . pravastatin (PRAVACHOL) 40 MG tablet Take 1 tablet (40 mg total) by mouth at bedtime.  30 tablet  2  . predniSONE (DELTASONE) 20 MG tablet Take 2 pills a day for 3 days, then 1 pill a day for 4 days then stop.  10 tablet  0  . triamcinolone cream (KENALOG) 0.5 % Apply  1 application topically 3 (three) times daily.       No current facility-administered medications for this visit.    Allergies  Allergen Reactions  . Atorvastatin Other (See Comments)    Subjective myalgias. Gave 2 trials, developed myalgias which resolved after cessation of medication.  Muscle cramps were 9/10 discomfort  . Codeine Nausea Only  . Shellfish Allergy Other (See Comments)    Activates asthma    History   Social History  . Marital Status: Widowed    Spouse Name: N/A    Number of Children: 4  . Years of Education: 12   Occupational History  . Disability-truck driver    Social History Main Topics  . Smoking status: Former Smoker -- 1.50 packs/day for 10 years    Types: Cigarettes    Quit date: 11/06/1991  . Smokeless tobacco: Never Used     Comment: quit 1993  . Alcohol Use: No  . Drug Use: No  . Sexual Activity: Not on file   Other Topics Concern  . Not on file   Social History Narrative   On disability   [employer]   Quit smoking 20 years ago (as of 01/2012)      Health Care POA:    Emergency Contact: brother, Mitzi Hansen [phone]   End of Life Plan: gave Ad pamphlet 5/15   Who lives with you: self   Any pets: none   Diet: pt has a varied diet of protein, starch and vegetables.   Exercise: pt does not have regular exercise routine.   Seatbelts: Pt reports wearing seatbelt when in vehicles.    Hobbies: plays bass and keyboard in church music group          Family History  Problem Relation Age of Onset  . Heart attack Mother   . Colon cancer Neg Hx   . Esophageal cancer Neg Hx   . Rectal cancer Neg Hx   . Stomach cancer Neg Hx     Review of Systems:  As stated in the HPI and otherwise negative.   BP 150/100  Pulse 74  Ht 5\' 7"  (1.702 m)  Wt 309 lb (140.161 kg)  BMI 48.38 kg/m2  Physical Examination: General: Well developed, well nourished, NAD HEENT: OP clear, mucus membranes moist SKIN: warm, dry. No rashes. Neuro: No focal  deficits Musculoskeletal: Muscle strength 5/5 all ext Psychiatric: Mood and affect normal Neck: No JVD, no carotid bruits, no thyromegaly, no lymphadenopathy. Lungs:Clear bilaterally, no wheezes, rhonci, crackles Cardiovascular: Regular rate and rhythm.  No murmurs, gallops or rubs. Abdomen:Soft. Bowel sounds present. Non-tender.  Extremities: No lower extremity edema. Pulses are 2 + in the bilateral DP/PT.  Echo 11/08/11:  Left ventricle: The cavity size was normal. Wall thickness was increased in a pattern of moderate LVH. Systolic function was normal. The estimated ejection fraction was in the range of 55% to 65%. Wall motion was normal; there were no regional wall motion abnormalities. Doppler parameters are consistent with abnormal left ventricular relaxation (grade 1 diastolic dysfunction). - Pulmonary arteries: Systolic pressure was mildly increased. PA peak pressure: 65mm Hg (S).  Lexiscan Stress Myoview 11/15/11:  Stress Procedure: The patient received IV Lexiscan 0.4 mg over 15-seconds. Technetium 51m Tetrofosmin injected at 30-seconds. There were no significant changes, sob, and abdominal pain with Lexiscan. Quantitative spect images were obtained after a 45 minute delay.  Stress ECG: No significant change from baseline ECG  QPS  Raw Data Images: Normal; no motion artifact; normal heart/lung ratio.  Stress Images: Normal homogeneous uptake in all areas of the myocardium.  Rest Images: Normal homogeneous uptake in all areas of the myocardium.  Subtraction (SDS): No evidence of ischemia.  Transient Ischemic Dilatation (Normal <1.22): 1.10  Lung/Heart Ratio (Normal <0.45): 0.44  Quantitative Gated Spect Images  QGS EDV: 121 ml  QGS ESV: 55 ml  Impression  Exercise Capacity: Lexiscan with no exercise.  BP Response: Normal blood pressure response.  Clinical Symptoms: There is dyspnea.  ECG Impression: No significant ST segment change suggestive of ischemia.  Comparison with  Prior Nuclear Study: No images to compare  Overall Impression: Normal stress nuclear study.   Assessment and Plan:   1. Right atrial/RV mass: Pt evaluated by Dr. Stanford Breed for abnormality in the RA/RV in 2009. Not there on f/u echo 2010. No evidence of RA/RV mass on repeat echo   2. CAD: Stable, mild disease by cath 2002. No ischemia on stress test September 2013. Continue current meds.   3. Chronic diastolic heart failure: Will restart Lasix 40 mg po QDaily.   4. HTN: BP elevated but controlled at home. Addition of Lasix may help.   5. OSA: He will restart his CPAP.

## 2013-09-14 ENCOUNTER — Telehealth: Payer: Self-pay | Admitting: Family Medicine

## 2013-09-14 ENCOUNTER — Other Ambulatory Visit: Payer: Self-pay | Admitting: *Deleted

## 2013-09-14 DIAGNOSIS — I10 Essential (primary) hypertension: Secondary | ICD-10-CM

## 2013-09-14 MED ORDER — CARVEDILOL 25 MG PO TABS: 25.0000 mg | ORAL_TABLET | Freq: Two times a day (BID) | ORAL | Status: DC

## 2013-09-14 MED ORDER — AMLODIPINE BESYLATE 5 MG PO TABS
5.0000 mg | ORAL_TABLET | Freq: Every day | ORAL | Status: DC
Start: 2013-09-14 — End: 2013-09-24

## 2013-09-14 MED ORDER — LISINOPRIL 20 MG PO TABS: 40.0000 mg | ORAL_TABLET | Freq: Every day | ORAL | Status: DC

## 2013-09-14 MED ORDER — OMEPRAZOLE 40 MG PO CPDR: 40.0000 mg | DELAYED_RELEASE_CAPSULE | Freq: Every day | ORAL | Status: DC

## 2013-09-14 MED ORDER — PRAVASTATIN SODIUM 40 MG PO TABS: 40.0000 mg | ORAL_TABLET | Freq: Every day | ORAL | Status: DC

## 2013-09-14 NOTE — Telephone Encounter (Signed)
Refills given for one month. Patient will need to be seen in the next month as blood pressure appears to have been elevated at his last visit. Please inform the patient of this. Thanks.

## 2013-09-14 NOTE — Telephone Encounter (Signed)
Refill request for amlodipine, lisinopril, pravastatin, carvedilol, & Prilosec. Please advise.

## 2013-09-15 NOTE — Telephone Encounter (Signed)
Pt informed and appt scheduled for 09/24/13. Alex [name], [name]

## 2013-09-24 ENCOUNTER — Ambulatory Visit (INDEPENDENT_AMBULATORY_CARE_PROVIDER_SITE_OTHER): Payer: Medicare Other | Admitting: Family Medicine

## 2013-09-24 ENCOUNTER — Encounter: Payer: Self-pay | Admitting: Family Medicine

## 2013-09-24 VITALS — BP 130/79 | HR 62 | Temp 97.6°F | Ht 67.0 in | Wt 303.0 lb

## 2013-09-24 DIAGNOSIS — G4733 Obstructive sleep apnea (adult) (pediatric): Secondary | ICD-10-CM

## 2013-09-24 DIAGNOSIS — E119 Type 2 diabetes mellitus without complications: Secondary | ICD-10-CM

## 2013-09-24 DIAGNOSIS — I10 Essential (primary) hypertension: Secondary | ICD-10-CM

## 2013-09-24 LAB — POCT GLYCOSYLATED HEMOGLOBIN (HGB A1C): Hemoglobin A1C: 6.7

## 2013-09-24 MED ORDER — METFORMIN HCL 500 MG PO TABS: 500.0000 mg | ORAL_TABLET | Freq: Two times a day (BID) | ORAL | Status: DC

## 2013-09-24 MED ORDER — AMLODIPINE BESYLATE 5 MG PO TABS: 5.0000 mg | ORAL_TABLET | Freq: Every day | ORAL | Status: DC

## 2013-09-24 MED ORDER — CETIRIZINE HCL 10 MG PO TABS: 10.0000 mg | ORAL_TABLET | Freq: Every day | ORAL | Status: DC

## 2013-09-24 MED ORDER — ASPIRIN 81 MG PO CHEW: 81.0000 mg | CHEWABLE_TABLET | Freq: Every day | ORAL | Status: DC

## 2013-09-24 MED ORDER — LISINOPRIL 20 MG PO TABS: 40.0000 mg | ORAL_TABLET | Freq: Every day | ORAL | Status: DC

## 2013-09-24 MED ORDER — PRAVASTATIN SODIUM 40 MG PO TABS: 40.0000 mg | ORAL_TABLET | Freq: Every day | ORAL | Status: DC

## 2013-09-24 MED ORDER — CARVEDILOL 25 MG PO TABS: 25.0000 mg | ORAL_TABLET | Freq: Two times a day (BID) | ORAL | Status: DC

## 2013-09-24 NOTE — Assessment & Plan Note (Signed)
Not compliant. Discussed importance of compliance with CPAP to help with cardiovascular health and for sleep hygiene. Patient voiced understanding and notes he will use the CPAP. F/u 3 months.

## 2013-09-24 NOTE — Progress Notes (Signed)
Patient ID: Alex [name], male   DOB: June 21, 1955, 58 y.o.   MRN: [id number]  [name], MD Phone: [phone]  Alex [name] is a 58 y.o. male who presents today for f/u.  HYPERTENSION Disease Monitoring Home BP Monitoring not checking lately Chest pain- no    Dyspnea- no Medications Compliance-  taking.  Edema- no  DIABETES Disease Monitoring: Blood Sugar ranges-not checking Polyuria/phagia/dipsia- notes polydipsia      Visual problems- no, has not been to optho recently Medications: Compliance- not currently taking medications, states he took himself off metformin a year ago Hypoglycemic symptoms- no  OSA: patient reports he has not been compliant with his CPAP. He notes he has restless nights of sleep. He does not feel well rested in the morning. He notes taking a nap each day. He reports he know he should use the CPAP more consistently. He is tolerating the mask well when he does use the machine.   Patient is a former smoker.   ROS: Per HPI   Physical Exam Filed Vitals:   09/24/13 1021  BP: 130/79  Pulse: 62  Temp: 97.6 F (36.4 C)    Gen: Well NAD HEENT: PERRL,  MMM Lungs: CTABL Nl WOB Heart: RRR no MRG Exts: Non edematous BL  LE, warm and well perfused.    Assessment/Plan: Please see individual problem list.  # Healthcare maintenance: up to date

## 2013-09-24 NOTE — Assessment & Plan Note (Signed)
A1c increased to 6.7. Has been off of metformin for a year and trying to manage with diet and exercise changes. Will restart this today. Patient to f/u in 1 month.

## 2013-09-24 NOTE — Assessment & Plan Note (Signed)
>>  ASSESSMENT AND PLAN FOR DIABETES TYPE 2, CONTROLLED WRITTEN ON 09/24/2013  2:57 PM BY SONNENBERG, ERIC G, MD  A1c increased to 6.7. Has been off of metformin for a year and trying to manage with diet and exercise changes. Will restart this today. Patient to f/u in 1 month.

## 2013-09-24 NOTE — Patient Instructions (Signed)
Nice to see you. I have refilled your medications. You A1c is above normal. We will start you back on metformin for this. Please use your CPAP every night.

## 2013-09-24 NOTE — Assessment & Plan Note (Signed)
At goal today. Continue current medication regimen. Refills given on amlodipine, coreg, and lisinopril. F/u in 3 months.

## 2013-10-26 ENCOUNTER — Ambulatory Visit (INDEPENDENT_AMBULATORY_CARE_PROVIDER_SITE_OTHER): Payer: Medicare Other | Admitting: Family Medicine

## 2013-10-26 ENCOUNTER — Encounter: Payer: Self-pay | Admitting: Family Medicine

## 2013-10-26 VITALS — BP 128/90 | HR 62 | Temp 97.9°F | Wt 303.0 lb

## 2013-10-26 DIAGNOSIS — I10 Essential (primary) hypertension: Secondary | ICD-10-CM

## 2013-10-26 DIAGNOSIS — G4733 Obstructive sleep apnea (adult) (pediatric): Secondary | ICD-10-CM

## 2013-10-26 DIAGNOSIS — E785 Hyperlipidemia, unspecified: Secondary | ICD-10-CM

## 2013-10-26 DIAGNOSIS — E119 Type 2 diabetes mellitus without complications: Secondary | ICD-10-CM

## 2013-10-26 MED ORDER — AMLODIPINE BESYLATE 5 MG PO TABS: 5.0000 mg | ORAL_TABLET | Freq: Every day | ORAL | Status: DC

## 2013-10-26 MED ORDER — LISINOPRIL 40 MG PO TABS: 40.0000 mg | ORAL_TABLET | Freq: Every day | ORAL | Status: DC

## 2013-10-26 MED ORDER — PRAVASTATIN SODIUM 80 MG PO TABS: 80.0000 mg | ORAL_TABLET | Freq: Every day | ORAL | Status: DC

## 2013-10-26 MED ORDER — CARVEDILOL 25 MG PO TABS: 25.0000 mg | ORAL_TABLET | Freq: Two times a day (BID) | ORAL | Status: DC

## 2013-10-26 NOTE — Assessment & Plan Note (Signed)
Again discussed the importance of using his CPAP machine to help him sleep better and to take stress off of his heart and lungs. He voiced understanding and stated he would start to use it. Will follow-up on this at the patients next visit in one month.

## 2013-10-26 NOTE — Progress Notes (Signed)
Patient ID: [name], male   DOB: [birth year], 58 y.o.   MRN: [id number]  Alex Rumps, MD Phone: [phone]  Alex [name] is a 58 y.o. male who presents today for f/u.  DIABETES Disease Monitoring: Blood Sugar ranges-does not check Polyuria/phagia/dipsia- no      Visual problems- no Medications: Compliance- states he felt "funny" after taking his metformin after his last visit, so he did not take this again until this past week. He notes he has taken it over the past week with no issues, though has only been taking one tablet a day Hypoglycemic symptoms- no  OSA: patient continues to not use his CPAP. He states he falls asleep easily. He wakes up not well rested in the morning. He notes waking up at night as well. He states he does not have his CPAP in his bedroom. He is unaware of the benefits of using his CPAP.  HYPERLIPIDEMIA Symptoms Chest pain on exertion:  no   Leg claudication:   no Medications: Compliance- taking Right upper quadrant pain- no  Muscle aches- no   Patient is a former smoker.   ROS: Per HPI   Physical Exam Filed Vitals:   10/26/13 0958  BP: 128/90  Pulse: 62  Temp: 97.9 F (36.6 C)    Gen: Well NAD HEENT: PERRL,  MMM Lungs: CTABL Nl WOB Heart: RRR no MRG Exts: Non edematous BL  LE, warm and well perfused.    Assessment/Plan: Please see individual problem list.  # Healthcare maintenance: needs flu vaccine when available  Alex Rumps, MD [employer] PGY-3

## 2013-10-26 NOTE — Assessment & Plan Note (Signed)
>>  ASSESSMENT AND PLAN FOR DIABETES TYPE 2, CONTROLLED WRITTEN ON 10/26/2013  6:00 PM BY SONNENBERG, ERIC G, MD  Discussed importance of taking metformin and taking this appropriately. He is to start metformin 500 mg BID. Patient voiced understanding. Will f/u in one month.

## 2013-10-26 NOTE — Patient Instructions (Signed)
Nice to see you. Please start taking the metformin twice a day. I have increased the dose of your cholesterol medication, pravastatin, to help with your cholesterol. I also changed the pill for your lisinopril to 40 mg. So you will take one dose daily. START USING YOUR CPAP. This will help with your sleepiness.

## 2013-10-26 NOTE — Assessment & Plan Note (Signed)
Patient tolerating current medication. Has intolerance to high intensity statin. Will increase dosing of pravastatin to 80 mg daily. F/u in one month for repeat LDL.

## 2013-10-26 NOTE — Assessment & Plan Note (Signed)
Is at goal at this time. Will continue lisinopril, coreg, and amlodipine at current dosing.

## 2013-10-26 NOTE — Assessment & Plan Note (Signed)
Discussed importance of taking metformin and taking this appropriately. He is to start metformin 500 mg BID. Patient voiced understanding. Will f/u in one month.

## 2013-11-12 ENCOUNTER — Other Ambulatory Visit: Payer: Self-pay | Admitting: *Deleted

## 2013-11-12 MED ORDER — LISINOPRIL 40 MG PO TABS: 40.0000 mg | ORAL_TABLET | Freq: Every day | ORAL | Status: DC

## 2013-11-27 ENCOUNTER — Other Ambulatory Visit: Payer: Self-pay | Admitting: *Deleted

## 2013-11-27 MED ORDER — FREESTYLE LITE DEVI: Status: DC

## 2013-11-27 MED ORDER — GLUCOSE BLOOD VI STRP: ORAL_STRIP | Status: DC

## 2013-11-27 MED ORDER — FREESTYLE LANCETS MISC: Status: DC

## 2013-12-01 ENCOUNTER — Other Ambulatory Visit: Payer: Self-pay | Admitting: Family Medicine

## 2013-12-01 ENCOUNTER — Encounter: Payer: Self-pay | Admitting: *Deleted

## 2013-12-01 MED ORDER — ACCU-CHEK AVIVA CONNECT W/DEVICE KIT: 1.0000 | PACK | Freq: Every day | Status: DC

## 2013-12-01 NOTE — Progress Notes (Signed)
Patient ID: Alex [name], male   DOB: [birth year], 58 y.o.   MRN: [id number] Changed to accu-chek.

## 2013-12-01 NOTE — Progress Notes (Signed)
Prior Authorization received from [employer] for [employer]. Preferred Accu-Check and One Touch products.  PA form placed in provider box for completion. Derl Barrow, RN

## 2013-12-17 ENCOUNTER — Ambulatory Visit (INDEPENDENT_AMBULATORY_CARE_PROVIDER_SITE_OTHER): Payer: Medicare Other | Admitting: Family Medicine

## 2013-12-17 ENCOUNTER — Encounter: Payer: Self-pay | Admitting: Family Medicine

## 2013-12-17 VITALS — BP 147/94 | HR 61 | Temp 97.6°F | Ht 67.0 in | Wt 302.9 lb

## 2013-12-17 DIAGNOSIS — K219 Gastro-esophageal reflux disease without esophagitis: Secondary | ICD-10-CM

## 2013-12-17 DIAGNOSIS — G44219 Episodic tension-type headache, not intractable: Secondary | ICD-10-CM

## 2013-12-17 DIAGNOSIS — R079 Chest pain, unspecified: Secondary | ICD-10-CM

## 2013-12-17 NOTE — Assessment & Plan Note (Addendum)
Patient with tension type headache and has prior history of migraine headaches. No neurological deficits at this time. Advised to take tylenol for discomfort. Given return precautions.

## 2013-12-17 NOTE — Progress Notes (Signed)
Patient ID: Alex [name], male   DOB: November 30, 1955, 58 y.o.   MRN: [id number]  [name], MD Phone: [phone]  Alex [name] is a 58 y.o. male who presents today for f/u.  Nausea: patient reports onset of nausea this morning. States he woke up with this. He notes a sour taste in the back of his mouth. No burning in his chest. No vomiting or diarrhea. No abdominal pain. He notes burping helps relieve the nausea. He is taking his reflux medication. Notes this occurred after eating a bag of fritos last night. His nausea is improving at this time.   Chest pain: occurred this morning lasting 1 second. In the area of the lower left sternum. Was a pinching sensation. No dyspnea, diaphoresis, or radiation. Has not had any additional pain.  Headache: notes headache in bilateral temples. Does not radiate. Is a mild achey sensation. Has been intermittent and gradual over the past few days. Notes it is similar to his previous headaches. He denies photophobia and phonophobia. He has not tried any medications for this.   Patient is a former smoker.   ROS: Per HPI   Physical Exam Filed Vitals:   12/17/13 1014  BP: 147/94  Pulse: 61  Temp: 97.6 F (36.4 C)    Gen: Well NAD HEENT: PERRL,  MMM Lungs: CTABL Nl WOB Heart: RRR no MRG Abd: soft, NT, ND MSK: no chest wall tenderness Neuro: CN 2-12 intact, 5/5 strength in bilateral biceps, triceps, grip, quads, hamstrings, plantar and dorsiflexion, sensation to light touch intact in bilateral UE and LE, normal gait, 2+ patellar reflexes Exts: Non edematous BL  LE, warm and well perfused.   Assessment/Plan: Please see individual problem list.  # Healthcare maintenance: needs flu shot at next visit  [name], MD [facility] PGY-3

## 2013-12-17 NOTE — Patient Instructions (Addendum)
Nice to see you. Please avoid triggers for your reflux, such as spicey or acidic foods and corn chips. Continue to take your prilosec. If your nausea worsens, you develop vomiting, diarrhea, or fever please let us know.  Take tylenol for your headache. If you develop worsening headache, weakness or numbness in one part of your body, or slurred speech please seek medical attention. If you develop chest pain that does not go away within a few seconds, moves to your neck or arms, or you sweat with the chest pain please seek medical attention.  I will see you back in one month.

## 2013-12-17 NOTE — Assessment & Plan Note (Signed)
Atypical chest pain lasting <1 second. Discussed that this is likely not cardiac and most likely MSK in origin. Given return precautions.

## 2013-12-17 NOTE — Assessment & Plan Note (Addendum)
Patient with nausea and sour taste in the back of his mouth likely stemming for dietary exacerbation of his reflux. Discussed avoidance of dietary triggers and continued use of PPI. If continues to have an issue with this could consider changing PPI. Given return precautions.

## 2014-01-18 ENCOUNTER — Ambulatory Visit: Payer: Medicare Other | Admitting: Family Medicine

## 2014-01-26 ENCOUNTER — Other Ambulatory Visit: Payer: Self-pay | Admitting: *Deleted

## 2014-01-26 MED ORDER — ACCU-CHEK FASTCLIX LANCETS MISC: 1.0000 | Freq: Every day | Status: DC

## 2014-01-26 MED ORDER — GLUCOSE BLOOD VI STRP: 1.0000 | ORAL_STRIP | Freq: Every day | Status: AC

## 2014-01-26 NOTE — Telephone Encounter (Signed)
pharmacist with [employer] came in requesting patient's diabetic supplies

## 2014-01-27 NOTE — Telephone Encounter (Signed)
Spoke with patient and informed him that rx has been sent in

## 2014-02-03 ENCOUNTER — Encounter: Payer: Self-pay | Admitting: Family Medicine

## 2014-02-03 ENCOUNTER — Ambulatory Visit (INDEPENDENT_AMBULATORY_CARE_PROVIDER_SITE_OTHER): Payer: Medicare Other | Admitting: Family Medicine

## 2014-02-03 VITALS — BP 140/98 | HR 75 | Temp 98.2°F | Ht 67.0 in | Wt 302.0 lb

## 2014-02-03 DIAGNOSIS — M25522 Pain in left elbow: Secondary | ICD-10-CM

## 2014-02-03 DIAGNOSIS — M25529 Pain in unspecified elbow: Secondary | ICD-10-CM | POA: Insufficient documentation

## 2014-02-03 MED ORDER — PREDNISONE 50 MG PO TABS: ORAL_TABLET | ORAL | Status: DC

## 2014-02-03 MED ORDER — COLCHICINE 0.6 MG PO TABS: ORAL_TABLET | ORAL | Status: DC

## 2014-02-03 NOTE — Progress Notes (Signed)
   Subjective:    Patient ID: RAMIE ERMAN, male    DOB: [birth year], 58 y.o.   MRN: [id number]  HPI 58 year old male with a history of gout and DM-2 presents for same day appointment with complaints of left elbow pain.  1) Left Elbow pain  Pain started 4 days ago.   Pain is located at the olecranon process.  Pain is moderate to severe.  Pain has been up to 10/10 in severity.    No recent fall, trauma, injury.  Patient does not recall any inciting event.   He's been taking frequent naproxen with no improvement.  He does report that this is exacerbated with physical activity/movement.   Pain is currently improving as of today.   No other joints affected. No recent fevers, chills, weight loss, rash.  Review of Systems Per HPI    Objective:   Physical Exam Filed Vitals:   02/03/14 1423  BP: 140/98  Pulse: 75  Temp: 98.2 F (36.8 C)   General: Well-appearing obese male in no acute distress. Elbow: Left Unremarkable to inspection. Range of motion - lacks approximately 5 of full extension.  Flexion limited secondary to pain.  Supination and pronation.  Stable to varus, valgus stress. Palpation - tender over the olecranon process, which is boggy upon palpation. Warmth noted upon palpation.      Assessment & Plan:  See Problem List

## 2014-02-03 NOTE — Patient Instructions (Signed)
It was nice to see you today.  Take the medications as prescribed.  Follow up with your PCP in 2-4 weeks.  If no improvement in the next few days please follow up.  Take care  Dr. Lacinda Axon

## 2014-02-03 NOTE — Assessment & Plan Note (Signed)
Bursitis versus gout. This would be a rather unusual location for gout but this is certainly a possibility given his past medical history of gout. This definitely appears inflammatory in nature. No obvious infectious source. Will treat with prednisone and colchicine and have patient follow with PCP.

## 2014-02-18 ENCOUNTER — Ambulatory Visit: Payer: Medicare Other | Admitting: Family Medicine

## 2014-05-04 ENCOUNTER — Other Ambulatory Visit: Payer: Self-pay | Admitting: Family Medicine

## 2014-05-13 ENCOUNTER — Ambulatory Visit (INDEPENDENT_AMBULATORY_CARE_PROVIDER_SITE_OTHER): Payer: Medicare Other | Admitting: Family Medicine

## 2014-05-13 ENCOUNTER — Encounter: Payer: Self-pay | Admitting: Family Medicine

## 2014-05-13 VITALS — BP 152/94 | HR 61 | Temp 97.9°F | Ht 67.0 in | Wt 303.0 lb

## 2014-05-13 DIAGNOSIS — E119 Type 2 diabetes mellitus without complications: Secondary | ICD-10-CM | POA: Diagnosis not present

## 2014-05-13 DIAGNOSIS — S96911A Strain of unspecified muscle and tendon at ankle and foot level, right foot, initial encounter: Secondary | ICD-10-CM

## 2014-05-13 DIAGNOSIS — I10 Essential (primary) hypertension: Secondary | ICD-10-CM | POA: Diagnosis not present

## 2014-05-13 LAB — LIPID PANEL
Cholesterol: 135 mg/dL (ref 0–200)
HDL: 28 mg/dL — ABNORMAL LOW (ref >=40)
LDL Cholesterol: 79 mg/dL (ref 0–99)
Total CHOL/HDL Ratio: 4.8 Ratio
Triglycerides: 141 mg/dL (ref <150)
VLDL: 28 mg/dL (ref 0–40)

## 2014-05-13 LAB — BASIC METABOLIC PANEL
BUN: 17 mg/dL (ref 6–23)
CO2: 23 mEq/L (ref 19–32)
Calcium: 9.3 mg/dL (ref 8.4–10.5)
Chloride: 103 mEq/L (ref 96–112)
Creat: 1.02 mg/dL (ref 0.50–1.35)
Glucose, Bld: 98 mg/dL (ref 70–99)
Potassium: 4.4 mEq/L (ref 3.5–5.3)
Sodium: 139 mEq/L (ref 135–145)

## 2014-05-13 LAB — POCT GLYCOSYLATED HEMOGLOBIN (HGB A1C): Hemoglobin A1C: 5.9

## 2014-05-13 MED ORDER — AMLODIPINE BESYLATE 10 MG PO TABS: 10.0000 mg | ORAL_TABLET | Freq: Every day | ORAL | Status: DC

## 2014-05-13 NOTE — Patient Instructions (Signed)
Nice to see you. Please complete the provided exercises twice a day. You can use ice on the area of your foot that is painful. You can also use naproxen as needed, though if you are taking this every day we need to know.  We are going to go up on your amlodipine to 10 mg daily. I sent this to your pharmacy.  I would like for you to come back in one month for a follow-up.

## 2014-05-13 NOTE — Progress Notes (Signed)
Patient ID: Alex [name], male   DOB: 01/07/1956, 59 y.o.   MRN: [id number]  [name], MD Phone: [phone]  Alex [name] is a 59 y.o. male who presents today for f/u.  HYPERTENSION Disease Monitoring Home BP Monitoring not checking Chest pain- no    Dyspnea- no Medications Compliance-  Taking coreg, lisinopril, amlodipine. Not taking lasix.   Edema- no  DIABETES Disease Monitoring: Blood Sugar ranges-not checking frequently, last was a month ago and was 111 Polyuria/phagia/dipsia- no   Medications: Compliance- diet controlled Hypoglycemic symptoms- not since stopping metformin  Right foot pain: for past week. Top of foot. Hurts when dorsiflexes foot. No erythema. No swelling. Has been walking more for exercise. No specific injury. Naproxen helps some.   Patient is a former smoker.   ROS: Per HPI   Physical Exam Filed Vitals:   05/13/14 0927  BP: 152/94  Pulse:   Temp:     Gen: Well NAD HEENT: PERRL,  MMM Lungs: CTABL Nl WOB Heart: RRR  MSK: right foot with minimal TTP over the insertion site of the anterior tibialis, no swelling, no erythema, no bony tenderness, normal left foot exam, normal sensation to light touch and strength in bilateral feet Exts: Non edematous BL  LE, warm and well perfused.    Assessment/Plan: Please see individual problem list.  [name], MD [place] PGY-3

## 2014-05-14 DIAGNOSIS — S96911A Strain of unspecified muscle and tendon at ankle and foot level, right foot, initial encounter: Secondary | ICD-10-CM | POA: Insufficient documentation

## 2014-05-14 NOTE — Assessment & Plan Note (Signed)
Not at goal. Will increase amlodipine to 10 mg daily. Continue other medications. F/u in one month for this issue. If still elevated may need to consider work-up for secondary HTN and addition of 4th agent.

## 2014-05-14 NOTE — Assessment & Plan Note (Signed)
A1c much improved from previously. Continue to work on exercise and diet.

## 2014-05-14 NOTE — Assessment & Plan Note (Signed)
>>  ASSESSMENT AND PLAN FOR DIABETES TYPE 2, CONTROLLED WRITTEN ON 05/14/2014  6:41 PM BY SONNENBERG, ERIC G, MD  A1c much improved from previously. Continue to work on exercise and diet.

## 2014-05-14 NOTE — Assessment & Plan Note (Addendum)
Patient with likely foot strain related to increasing level of activity. Unlikely to be gout given location, lack of erythema, and ability to touch with out significant discomfort. Advised on naproxen prn and to use exercises provided. Theraband provided for exercises. Given return precautions.

## 2014-05-18 ENCOUNTER — Encounter: Payer: Self-pay | Admitting: Family Medicine

## 2014-06-02 ENCOUNTER — Other Ambulatory Visit: Payer: Self-pay | Admitting: Family Medicine

## 2014-06-15 ENCOUNTER — Ambulatory Visit: Payer: Medicare Other | Admitting: Family Medicine

## 2014-07-21 ENCOUNTER — Encounter: Payer: Self-pay | Admitting: Family Medicine

## 2014-07-21 ENCOUNTER — Ambulatory Visit (INDEPENDENT_AMBULATORY_CARE_PROVIDER_SITE_OTHER): Payer: Medicare Other | Admitting: Family Medicine

## 2014-07-21 VITALS — BP 138/80 | HR 72 | Temp 98.1°F | Ht 67.0 in | Wt 311.0 lb

## 2014-07-21 DIAGNOSIS — M10029 Idiopathic gout, unspecified elbow: Secondary | ICD-10-CM

## 2014-07-21 DIAGNOSIS — R079 Chest pain, unspecified: Secondary | ICD-10-CM

## 2014-07-21 DIAGNOSIS — H539 Unspecified visual disturbance: Secondary | ICD-10-CM

## 2014-07-21 DIAGNOSIS — E119 Type 2 diabetes mellitus without complications: Secondary | ICD-10-CM

## 2014-07-21 NOTE — Patient Instructions (Addendum)
Nice to see you. Please keep your cardiology appointment.  If you develop any chest pain, shortness of breath, sweating, or persistent chest pain please seek medical attention.  We will refer you to an eye doctor for a check up.  Please call us once your gout flare has subsided and we will check labs and get you started on allopurinol.

## 2014-07-23 DIAGNOSIS — H539 Unspecified visual disturbance: Secondary | ICD-10-CM | POA: Insufficient documentation

## 2014-07-23 NOTE — Assessment & Plan Note (Signed)
Bilateral elbow discomfort likely related to gout. No acute injury to indicate fracture or need for injury. Non-tender at this time. Advised to continue prn colchicine until completely over acute flare and then call so we can send obtain uric acid lab and start on allopurinol. He voiced understanding. Given return precautions.

## 2014-07-23 NOTE — Assessment & Plan Note (Signed)
Atypical chest pain likely MSK in origin. With history of mild non-obstructive CAD in the past I discussed and advised we obtain EKG to evaluate this, though patient declined. Discussed having him see cardiology sooner as well, though he stated he would be ok to keep his appointment in June. Unlikely to be PE with no tachycardia or dyspnea, no no recurrence of chest pain. I advised the patient of return precautions. Also advised that he needs to keep his upcoming cardiology appointment. Per patient choice we will monitor this issue at this time

## 2014-07-23 NOTE — Progress Notes (Signed)
Patient ID: Alex [name], male   DOB: [birth year], 59 y.o.   MRN: [id number]  Alex Rumps, MD Phone: [phone]  Alex [name] is a 59 y.o. male who presents today for f/u.  Bilateral elbow pain: notes pain was acute onset several days ago. Was sore to light touch. Minimal swelling in the right olecranon bursa area. No warmth or erythema. Took colchicine 2 pills on day one then one pill for a couple of days. Had improvement with this. No acute injury. No recent use of HCTZ. Has not been on allopurinol. Pain is improved significantly now.   Chest pain: had one episode of left sided pain that ran along the inferior border of his pectoralis. It was sharp and achey. Lasted 5 seconds. Occurred when walking, though went away without rest. No dyspnea or diaphoresis. No episodes since then. Not worse with deep breath. No history of blood clots, long trips, surgery, cough, or hemoptysis. No chest pain at this time. Notes he has a cardiology appointment next month.  Vision: notes his up close vision has gotten worse recently. No issues with distance. Has to hold things further from his face to see them. Would like a optho referral.   PMH: nonsmoker.   ROS: Per HPI   Physical Exam Filed Vitals:   07/21/14 0928  BP: 138/80  Pulse: 72  Temp: 98.1 F (36.7 C)    Gen: Well NAD HEENT: PERRL,  MMM Lungs: CTABL Nl WOB Heart: RRR, no murmur appreciated MSK: no chest wall tenderness and no rib tenderness Bilateral elbows with no swelling or erythema, no tenderness to palpation, full ROM, 5/5 strength in biceps, triceps, and grip, sensation to light touch intact UE bilaterally Exts: Non edematous BL  LE, warm and well perfused.    Assessment/Plan: Please see individual problem list.  Alex Rumps, MD [place] PGY-3

## 2014-07-23 NOTE — Assessment & Plan Note (Signed)
Notes becoming more far sighted. Distance vision with correction 20/30. Will refer to optho for consideration of bifocals.

## 2014-07-28 ENCOUNTER — Ambulatory Visit (INDEPENDENT_AMBULATORY_CARE_PROVIDER_SITE_OTHER): Payer: Medicare Other | Admitting: Family Medicine

## 2014-07-28 ENCOUNTER — Encounter: Payer: Self-pay | Admitting: Family Medicine

## 2014-07-28 VITALS — BP 124/77 | HR 73 | Temp 98.4°F | Ht 67.0 in | Wt 304.2 lb

## 2014-07-28 DIAGNOSIS — J111 Influenza due to unidentified influenza virus with other respiratory manifestations: Secondary | ICD-10-CM | POA: Diagnosis not present

## 2014-07-28 MED ORDER — OSELTAMIVIR PHOSPHATE 75 MG PO CAPS: 75.0000 mg | ORAL_CAPSULE | Freq: Two times a day (BID) | ORAL | Status: DC

## 2014-07-28 NOTE — Progress Notes (Addendum)
Patient ID: Alex [name], male   DOB: June 29, 1955, 59 y.o.   MRN: [id number]   HPI  Patient presents today for flulike illness  Patient had rapid onset of symptoms including chills, malaise, emesis and diarrhea. Symptoms started around 11:00 on Monday. Had 5-6 episodes of emesis yesterday and has improved today. He cannot tolerate food or fluids yesterday but today has tolerated fluids without any nausea. He's also had muscle aches and pains.   He denies cough, dyspnea, chest pain.  Notably he also had tick exposure 8 days ago. He found 2 ticks crawling around on him but believes they did not bite him. He's not had any rashes and had no symptoms to 3 days after that time.  Smoking status noted-former smoker ROS: Per HPI  Objective: BP 124/77 mmHg  Pulse 73  Temp(Src) 98.4 F (36.9 C) (Oral)  Ht 5\' 7"  (1.702 m)  Wt 304 lb 3.2 oz (137.984 kg)  BMI 47.63 kg/m2 Gen: NAD, alert, cooperative with exam HEENT: NCAT, MMM, PERRLA CV: RRR, good S1/S2, no murmur Resp: CTABL, no wheezes, non-labored Ext: No edema, warm Neuro: Alert and oriented, No gross deficits  Assessment and plan:  Influenza Flu- like illness within  48 hours of onset Treat with tamiflu No NSAIDs with CAD Tylenol Fluids, and rest, red flags reviewed in detail     Meds ordered this encounter  Medications  . oseltamivir (TAMIFLU) 75 MG capsule    Sig: Take 1 capsule (75 mg total) by mouth 2 (two) times daily.    Dispense:  10 capsule    Refill:  0

## 2014-07-28 NOTE — Assessment & Plan Note (Signed)
Flu- like illness within  48 hours of onset Treat with tamiflu No NSAIDs with CAD Tylenol Fluids, and rest, red flags reviewed in detail

## 2014-07-28 NOTE — Patient Instructions (Signed)
Great to meet you!  Take tylenol for your aches and pains, get plenty of fluids and rest  Influenza Influenza ("the flu") is a viral infection of the respiratory tract. It occurs more often in winter months because people spend more time in close contact with one another. Influenza can make you feel very sick. Influenza easily spreads from person to person (contagious). CAUSES  Influenza is caused by a virus that infects the respiratory tract. You can catch the virus by breathing in droplets from an infected person's cough or sneeze. You can also catch the virus by touching something that was recently contaminated with the virus and then touching your mouth, nose, or eyes. RISKS AND COMPLICATIONS You may be at risk for a more severe case of influenza if you smoke cigarettes, have diabetes, have chronic heart disease (such as heart failure) or lung disease (such as asthma), or if you have a weakened immune system. Elderly people and pregnant women are also at risk for more serious infections. The most common problem of influenza is a lung infection (pneumonia). Sometimes, this problem can require emergency medical care and may be life threatening. SIGNS AND SYMPTOMS  Symptoms typically last 4 to 10 days and may include:  Fever.  Chills.  Headache, body aches, and muscle aches.  Sore throat.  Chest discomfort and cough.  Poor appetite.  Weakness or feeling tired.  Dizziness.  Nausea or vomiting. DIAGNOSIS  Diagnosis of influenza is often made based on your history and a physical exam. A nose or throat swab test can be done to confirm the diagnosis. TREATMENT  In mild cases, influenza goes away on its own. Treatment is directed at relieving symptoms. For more severe cases, your health care provider may prescribe antiviral medicines to shorten the sickness. Antibiotic medicines are not effective because the infection is caused by a virus, not by bacteria. HOME CARE INSTRUCTIONS  Take  medicines only as directed by your health care provider.  Use a cool mist humidifier to make breathing easier.  Get plenty of rest until your temperature returns to normal. This usually takes 3 to 4 days.  Drink enough fluid to keep your urine clear or pale yellow.  Cover yourmouth and nosewhen coughing or sneezing,and wash your handswellto prevent thevirusfrom spreading.  Stay homefromwork orschool untilthe fever is gonefor at least 78full day. PREVENTION  An annual influenza vaccination (flu shot) is the best way to avoid getting influenza. An annual flu shot is now routinely recommended for all adults in the [place] IF:  You experiencechest pain, yourcough worsens,or you producemore mucus.  Youhave nausea,vomiting, ordiarrhea.  Your fever returns or gets worse. SEEK IMMEDIATE MEDICAL CARE IF:  You havetrouble breathing, you become short of breath,or your skin ornails becomebluish.  You have severe painor stiffnessin the neck.  You develop a sudden headache, or pain in the face or ear.  You have nausea or vomiting that you cannot control. MAKE SURE YOU:   Understand these instructions.  Will watch your condition.  Will get help right away if you are not doing well or get worse. Document Released: 02/24/2000 Document Revised: 07/13/2013 Document Reviewed: 05/28/2011 [facility] Patient Information 2015 [place], [place]. This information is not intended to replace advice given to you by your health care provider. Make sure you discuss any questions you have with your health care provider.

## 2014-07-30 ENCOUNTER — Telehealth: Payer: Self-pay | Admitting: Family Medicine

## 2014-07-30 NOTE — Telephone Encounter (Signed)
Alex [name] was unable to afford rx that was sent in to pharmacy.  Still not feeling better.  Need to speak with provider for advice.

## 2014-07-30 NOTE — Telephone Encounter (Signed)
Spoke with patient regarding Tamiflu.  Pt has not picked up medication due to cost.  Pt is still feeling bad and having diarrhea.  Pt is taking OTC cold and Flu relief.  Pt advised to keep hydrated, eat small frequent meals.  Pt denies any fever today.  Pt advised he will need to schedule an appt or go to urgent care if feeling really bad, but will forward to PCP for further advise.  Derl Barrow, RN

## 2014-07-30 NOTE — Telephone Encounter (Signed)
Pt advised to follow up with [facility] is still feeling and to go urgent care or ED is SOB/chest pain develops.  Pt stated if does not get better by Monday he will call for an appt.  Derl Barrow, RN

## 2014-07-30 NOTE — Telephone Encounter (Signed)
Agree with nursing advice. Most important is to stay hydrated. If continuing to feel poorly he should be seen in follow-up. If short of breath or with chest pain he should be seen immediately.

## 2014-08-06 ENCOUNTER — Telehealth: Payer: Self-pay | Admitting: Family Medicine

## 2014-08-06 ENCOUNTER — Other Ambulatory Visit: Payer: Medicare Other

## 2014-08-06 DIAGNOSIS — M10029 Idiopathic gout, unspecified elbow: Secondary | ICD-10-CM

## 2014-08-06 LAB — URIC ACID: Uric Acid, Serum: 8.6 mg/dL — ABNORMAL HIGH (ref 4.0–7.8)

## 2014-08-06 NOTE — Telephone Encounter (Signed)
Pt is aware of this. Alex [name],CMA  

## 2014-08-06 NOTE — Progress Notes (Signed)
Uric acid done today Norvin Ohlin

## 2014-08-06 NOTE — Telephone Encounter (Signed)
It appears the patient already had blood work done today. Will see what his uric acid is and then likely start him on allopurinol. If he is continuing to have issues he will need to follow-up for this issue.

## 2014-08-06 NOTE — Telephone Encounter (Signed)
Pt called and wanted some advice because he has a lab appointment today but was told to come in when his gout flare up was gone. The issue is that his gout is still a issue and the medication he is on does not help. He wanted to know should he still come in to do his blood work or should he reschedule. Please let patient know jw

## 2014-08-06 NOTE — Telephone Encounter (Signed)
Will forward to MD to advise. Jenaveve Fenstermaker,CMA  

## 2014-08-10 ENCOUNTER — Telehealth: Payer: Self-pay | Admitting: Family Medicine

## 2014-08-10 NOTE — Telephone Encounter (Signed)
Alex [name] had a bad bout of with his gout this wkend and wanted to have the medication he was told he could get to sent to [name] on [address].

## 2014-08-10 NOTE — Telephone Encounter (Signed)
Will forward to PCP for review. Patient had bloodwork done on 08/06/14. Daniela Siebers, CMA.

## 2014-08-11 MED ORDER — ALLOPURINOL 100 MG PO TABS: 100.0000 mg | ORAL_TABLET | Freq: Every day | ORAL | Status: DC

## 2014-08-11 MED ORDER — COLCHICINE 0.6 MG PO TABS: ORAL_TABLET | ORAL | Status: DC

## 2014-08-11 NOTE — Telephone Encounter (Signed)
Mr. Vest is calling to check on the status of this request, he kindly explained that he has been in pain for about a week now. Please advise at the earliest convenience. Thank you, Fonda Kinder, ASA

## 2014-08-11 NOTE — Telephone Encounter (Signed)
Called and spoke to the patient with regards to his gout. Has had recurrence of gout flare in his big toe. Notes it is painful and he could hardly walk on it. Advised to start taking colchicine again, though he does not have any and a refill was sent in for him. Advised to take colchicine. I sent in allopurinol for the patient and advised him to start this after he is over this acute flare. If not better in the next 1-2 days of colchicine he will follow-up in the office.

## 2014-08-19 ENCOUNTER — Encounter: Payer: Self-pay | Admitting: Cardiovascular Disease

## 2014-08-19 ENCOUNTER — Ambulatory Visit (INDEPENDENT_AMBULATORY_CARE_PROVIDER_SITE_OTHER): Payer: Medicare Other | Admitting: Cardiovascular Disease

## 2014-08-19 VITALS — BP 118/80 | HR 71 | Ht 67.0 in | Wt 304.1 lb

## 2014-08-19 DIAGNOSIS — G4733 Obstructive sleep apnea (adult) (pediatric): Secondary | ICD-10-CM | POA: Diagnosis not present

## 2014-08-19 DIAGNOSIS — I10 Essential (primary) hypertension: Secondary | ICD-10-CM

## 2014-08-19 DIAGNOSIS — I5032 Chronic diastolic (congestive) heart failure: Secondary | ICD-10-CM | POA: Diagnosis not present

## 2014-08-19 DIAGNOSIS — I2510 Atherosclerotic heart disease of native coronary artery without angina pectoris: Secondary | ICD-10-CM

## 2014-08-19 MED ORDER — FUROSEMIDE 40 MG PO TABS: 40.0000 mg | ORAL_TABLET | Freq: Every day | ORAL | Status: DC

## 2014-08-19 NOTE — Patient Instructions (Signed)
Medication Instructions:  Your physician recommends that you continue on your current medications as directed. Please refer to the Current Medication list given to you today.   Labwork: none  Testing/Procedures: none  Follow-Up: Your physician wants you to follow-up in:  12 months.  You will receive a reminder letter in the mail two months in advance. If you don't receive a letter, please call our office to schedule the follow-up appointment.        

## 2014-08-19 NOTE — Addendum Note (Signed)
Addended by: Thompson Grayer on: 08/19/2014 01:44 PM   Modules accepted: Orders

## 2014-08-19 NOTE — Progress Notes (Signed)
Chief Complaint  Patient presents with  . Leg Swelling     History of Present Illness: 59 yo male with history of DM, HTN, HLD, mild non-obstructive CAD by cath 2002, asthma, GERD, OSA on CPAP here today for cardiac follow up. He was seen as a new patient 11/06/11. He had a cardiac cath in 2002 which showed 25% stenosis in the distal RCA. He was admitted to [facility] December 2009 with chest pain and ruled out for an MI with serial cardiac enzymes. Echo suggested possible RA/RV mass. He was seen in f/u by Dr. Stanford Breed in December 2009 and February 2010 with a follow-up echocardiogram, both to requantify his LV function and to rule out the right atrial mass. This was performed on March 31, 2008. He was found to have distal septal hypokinesis, but his ejection fraction is 55%. There was mild-to-moderate LVH. There was no mass noted across the tricuspid valve. The left atrium is mildly dilated. The ascending aorta measured 43 mm. Stress myoview December 2009 with ejection fraction of 48%. There was mild apical thinning, but no ischemia or scar. There was left ventricular enlargement. Cardiac MRI had been planned but f/u echo was normal in regards to RA/RV thickening so was not pursued. At his first visit here on 11/06/11, he told me that he had been having pain in his right arm for 3 weeks as well as pains in his chest several times per week. There was associated SOB and fatigue. I arranged an echo on 11/08/11 which showed normal LV size and function with LVEF of 55-65% and moderate LVH. There were no significant valvular issues. Lexiscan myoview on 11/15/11 with no inducible ischemia. Admitted to [facility] 12/24/11 with TIA type symptoms.   He is here for f/u. Feeling well. No chest pain or SOB. No dizziness, near syncope or syncope. Weight is stable. He reports daytime fatigue. He has not been using his CPAP.   Primary Care Physician: Caryl Bis   Past Medical History  Diagnosis Date  . HTN (hypertension)   .  Diabetes mellitus   . GERD (gastroesophageal reflux disease)   . CAD (coronary artery disease)     Cardiac cath 2002 with 25% distal RCA stenosis.   . Hyperlipidemia   . Asthma   . MI (myocardial infarction) 2009    no stent placement  . Gout   . Obesity   . TIA (transient ischemic attack) 2013  . Esophageal stricture   . OA (osteoarthritis)   . Insomnia   . DDD (degenerative disc disease), lumbar   . Gastritis 2010  . Seasonal allergies   . OSA on CPAP     Past Surgical History  Procedure Laterality Date  . Vasectomy    . Nasal sinus surgery    . Esophagogastroduodenoscopy      multiple    Current Outpatient Prescriptions  Medication Sig Dispense Refill  . ACCU-CHEK FASTCLIX LANCETS MISC 1 Device by Does not apply route daily. 102 each 3  . albuterol (PROVENTIL HFA;VENTOLIN HFA) 108 (90 BASE) MCG/ACT inhaler Inhale 2 puffs into the lungs every 6 (six) hours as needed for wheezing or shortness of breath.    . allopurinol (ZYLOPRIM) 100 MG tablet Take 1 tablet (100 mg total) by mouth daily. 30 tablet 2  . amLODipine (NORVASC) 10 MG tablet Take 1 tablet (10 mg total) by mouth daily. 90 tablet 2  . aspirin 81 MG chewable tablet Chew 1 tablet (81 mg total) by mouth daily. 90 tablet 3  .  Blood Glucose Monitoring Suppl (ACCU-CHEK AVIVA CONNECT) W/DEVICE KIT 1 Device by Does not apply route daily. 1 kit 0  . carvedilol (COREG) 25 MG tablet Take 1 tablet (25 mg total) by mouth 2 (two) times daily with a meal. 180 tablet 3  . cetirizine (ZYRTEC) 10 MG tablet Take 1 tablet (10 mg total) by mouth at bedtime. 90 tablet 3  . colchicine 0.6 MG tablet TAKE TWO TABLETS BY MOUTH TODAY THEN TAKE ONE TABLET BY MOUTH DAILY. 31 tablet 1  . fluticasone (FLONASE) 50 MCG/ACT nasal spray Place 1 spray into both nostrils daily. 16 g 2  . furosemide (LASIX) 40 MG tablet Take 1 tablet (40 mg total) by mouth daily. 30 tablet 11  . glucose blood test strip 1 each by Other route daily. Use as instructed  100 each 12  . Lancets (FREESTYLE) lancets Use as instructed 100 each 12  . lisinopril (PRINIVIL,ZESTRIL) 40 MG tablet Take 1 tablet (40 mg total) by mouth daily. 90 tablet 3  . metFORMIN (GLUCOPHAGE) 500 MG tablet Take 1 tablet (500 mg total) by mouth 2 (two) times daily with a meal. 180 tablet 3  . naproxen (NAPROSYN) 500 MG tablet TAKE ONE TABLET BY MOUTH ONCE DAILY AS NEEDED 60 tablet 0  . omeprazole (PRILOSEC) 40 MG capsule Take 1 capsule (40 mg total) by mouth daily. 90 capsule 3  . oseltamivir (TAMIFLU) 75 MG capsule Take 1 capsule (75 mg total) by mouth 2 (two) times daily. 10 capsule 0  . pravastatin (PRAVACHOL) 80 MG tablet Take 1 tablet (80 mg total) by mouth at bedtime. 90 tablet 3  . triamcinolone cream (KENALOG) 0.5 % Apply 1 application topically 3 (three) times daily.     No current facility-administered medications for this visit.    Allergies  Allergen Reactions  . Atorvastatin Other (See Comments)    Subjective myalgias. Gave 2 trials, developed myalgias which resolved after cessation of medication.  Muscle cramps were 9/10 discomfort  . Codeine Nausea Only  . Shellfish Allergy Other (See Comments)    Activates asthma    History   Social History  . Marital Status: Widowed    Spouse Name: N/A  . Number of Children: 4  . Years of Education: 12   Occupational History  . Disability-truck driver    Social History Main Topics  . Smoking status: Former Smoker -- 1.50 packs/day for 10 years    Types: Cigarettes    Quit date: 11/06/1991  . Smokeless tobacco: Never Used     Comment: quit 1993  . Alcohol Use: No  . Drug Use: No  . Sexual Activity: Not on file   Other Topics Concern  . Not on file   Social History Narrative   On disability   [employer]   Quit smoking 20 years ago (as of 01/2012)      Health Care POA:    Emergency Contact: brother, Mitzi Hansen [phone]   End of Life Plan: gave Ad pamphlet 5/15   Who lives with you: self   Any pets: none   Diet:  pt has a varied diet of protein, starch and vegetables.   Exercise: pt does not have regular exercise routine.   Seatbelts: Pt reports wearing seatbelt when in vehicles.    Hobbies: plays bass and keyboard in church music group          Family History  Problem Relation Age of Onset  . Heart attack Mother   . Heart failure Mother   .  Colon cancer Neg Hx   . Esophageal cancer Neg Hx   . Rectal cancer Neg Hx   . Stomach cancer Neg Hx     Review of Systems:  As stated in the HPI and otherwise negative.   BP 118/80 mmHg  Pulse 71  Ht '5\' 7"'  (1.702 m)  Wt 304 lb 1.9 oz (137.948 kg)  BMI 47.62 kg/m2  Physical Examination: General: Well developed, well nourished, NAD HEENT: OP clear, mucus membranes moist SKIN: warm, dry. No rashes. Neuro: No focal deficits Musculoskeletal: Muscle strength 5/5 all ext Psychiatric: Mood and affect normal Neck: No JVD, no carotid bruits, no thyromegaly, no lymphadenopathy. Lungs:Clear bilaterally, no wheezes, rhonci, crackles Cardiovascular: Regular rate and rhythm. No murmurs, gallops or rubs. Abdomen:Soft. Bowel sounds present. Non-tender.  Extremities: No lower extremity edema. Pulses are 2 + in the bilateral DP/PT.  Echo 11/08/11:  Left ventricle: The cavity size was normal. Wall thickness was increased in a pattern of moderate LVH. Systolic function was normal. The estimated ejection fraction was in the range of 55% to 65%. Wall motion was normal; there were no regional wall motion abnormalities. Doppler parameters are consistent with abnormal left ventricular relaxation (grade 1 diastolic dysfunction). - Pulmonary arteries: Systolic pressure was mildly increased. PA peak pressure: 1m Hg (S).  Lexiscan Stress Myoview 11/15/11:  Stress Procedure: The patient received IV Lexiscan 0.4 mg over 15-seconds. Technetium 952metrofosmin injected at 30-seconds. There were no significant changes, sob, and abdominal pain with Lexiscan. Quantitative  spect images were obtained after a 45 minute delay.  Stress ECG: No significant change from baseline ECG  QPS  Raw Data Images: Normal; no motion artifact; normal heart/lung ratio.  Stress Images: Normal homogeneous uptake in all areas of the myocardium.  Rest Images: Normal homogeneous uptake in all areas of the myocardium.  Subtraction (SDS): No evidence of ischemia.  Transient Ischemic Dilatation (Normal <1.22): 1.10  Lung/Heart Ratio (Normal <0.45): 0.44  Quantitative Gated Spect Images  QGS EDV: 121 ml  QGS ESV: 55 ml  Impression  Exercise Capacity: Lexiscan with no exercise.  BP Response: Normal blood pressure response.  Clinical Symptoms: There is dyspnea.  ECG Impression: No significant ST segment change suggestive of ischemia.  Comparison with Prior Nuclear Study: No images to compare  Overall Impression: Normal stress nuclear study.   EKG:  EKG is ordered today. The ekg ordered today demonstrates NSR, rate 71 bpm.   Recent Labs: 05/13/2014: BUN 17; Creat 1.02; Potassium 4.4; Sodium 139   Lipid Panel    Component Value Date/Time   CHOL 135 05/13/2014 0937   TRIG 141 05/13/2014 0937   HDL 28* 05/13/2014 0937   CHOLHDL 4.8 05/13/2014 0937   VLDL 28 05/13/2014 0937   LDLCALC 79 05/13/2014 0937   LDLDIRECT 130* 02/22/2012 0910     Wt Readings from Last 3 Encounters:  08/19/14 304 lb 1.9 oz (137.948 kg)  07/28/14 304 lb 3.2 oz (137.984 kg)  07/21/14 311 lb (141.069 kg)     Other studies Reviewed: Additional studies/ records that were reviewed today include:  Review of the above records demonstrates:    Assessment and Plan:   1. Right atrial/RV mass: Pt evaluated by Dr. CrStanford Breedor abnormality in the RA/RV in 2009. Not there on f/u echo 2010. No evidence of RA/RV mass on repeat echo   2. CAD: Stable, mild disease by cath 2002. No ischemia on stress test September 2013. Continue current meds.   3. Chronic diastolic heart failure: Weight  stable. He has not been  using Lasix as advised. Will restart Lasix 40 mg daily.  4. HTN: BP controlled.    5. OSA: He has not been using his CPAP. He will restart his CPAP.   Current medicines are reviewed at length with the patient today.  The patient does not have concerns regarding medicines.  The following changes have been made:  no change  Labs/ tests ordered today include:  No orders of the defined types were placed in this encounter.    Disposition:   FU with me in 12  months  Signed, Lauree Chandler, MD 08/19/2014 1:03 PM    [facility] [address], [place], [place]  [address] Phone: [phone]; Fax: [phone]

## 2014-08-23 ENCOUNTER — Encounter: Payer: Self-pay | Admitting: Family Medicine

## 2014-08-23 ENCOUNTER — Ambulatory Visit (INDEPENDENT_AMBULATORY_CARE_PROVIDER_SITE_OTHER): Payer: Medicare Other | Admitting: Family Medicine

## 2014-08-23 VITALS — BP 112/72 | HR 88 | Temp 97.3°F | Ht 67.0 in | Wt 302.0 lb

## 2014-08-23 DIAGNOSIS — E119 Type 2 diabetes mellitus without complications: Secondary | ICD-10-CM

## 2014-08-23 DIAGNOSIS — I10 Essential (primary) hypertension: Secondary | ICD-10-CM | POA: Diagnosis not present

## 2014-08-23 DIAGNOSIS — M109 Gout, unspecified: Secondary | ICD-10-CM

## 2014-08-23 DIAGNOSIS — M10071 Idiopathic gout, right ankle and foot: Secondary | ICD-10-CM

## 2014-08-23 LAB — COMPREHENSIVE METABOLIC PANEL
ALT: 23 U/L (ref 0–53)
AST: 23 U/L (ref 0–37)
Albumin: 4 g/dL (ref 3.5–5.2)
Alkaline Phosphatase: 63 U/L (ref 39–117)
BUN: 22 mg/dL (ref 6–23)
CO2: 23 mEq/L (ref 19–32)
Calcium: 9.5 mg/dL (ref 8.4–10.5)
Chloride: 106 mEq/L (ref 96–112)
Creat: 1.15 mg/dL (ref 0.50–1.35)
Glucose, Bld: 92 mg/dL (ref 70–99)
Potassium: 4.2 mEq/L (ref 3.5–5.3)
Sodium: 140 mEq/L (ref 135–145)
Total Bilirubin: 0.6 mg/dL (ref 0.2–1.2)
Total Protein: 7.2 g/dL (ref 6.0–8.3)

## 2014-08-23 LAB — CBC
HCT: 37.8 % — ABNORMAL LOW (ref 39.0–52.0)
Hemoglobin: 12.3 g/dL — ABNORMAL LOW (ref 13.0–17.0)
MCH: 25.6 pg — ABNORMAL LOW (ref 26.0–34.0)
MCHC: 32.5 g/dL (ref 30.0–36.0)
MCV: 78.6 fL (ref 78.0–100.0)
MPV: 10.5 fL (ref 8.6–12.4)
Platelets: 324 10*3/uL (ref 150–400)
RBC: 4.81 MIL/uL (ref 4.22–5.81)
RDW: 15.7 % — ABNORMAL HIGH (ref 11.5–15.5)
WBC: 8.8 10*3/uL (ref 4.0–10.5)

## 2014-08-23 LAB — POCT GLYCOSYLATED HEMOGLOBIN (HGB A1C): Hemoglobin A1C: 6.2

## 2014-08-23 NOTE — Assessment & Plan Note (Signed)
>>  ASSESSMENT AND PLAN FOR DIABETES TYPE 2, CONTROLLED WRITTEN ON 08/23/2014  3:18 PM BY SONNENBERG, ERIC G, MD  A1c pending at time patient left office. Will await results and call patient to discuss. Has been diet controlled.

## 2014-08-23 NOTE — Patient Instructions (Addendum)
Nice to see you. Please continue the allopurinol.  We will check some blood work today and will call with results.  If your foot pain returns, you develop fever, or redness please seek medical attention.

## 2014-08-23 NOTE — Assessment & Plan Note (Signed)
Improved on allopurinol. Will continue this. Check CBC and CMET for drug monitoring. Will have patient return in one month for repeat uric acid. Given return precautions.

## 2014-08-23 NOTE — Assessment & Plan Note (Signed)
At goal on recheck and at goal at cardiology appointment recently. Continue current medication regimen.

## 2014-08-23 NOTE — Progress Notes (Signed)
Patient ID: Alex [name], male   DOB: 02-Apr-1955, 59 y.o.   MRN: [id number]  [name], MD Phone: [phone]  Alex [name] is a 59 y.o. male who presents today for f/u.  Gout: is improved since starting allopurinol. Started this on Friday. Had pain in right 1st MTP, though this is now gone. Was sudden onset. Could not put sock on with out pain. No redness or swelling. No injury. Was unable to afford colchicine.   DIABETES Disease Monitoring: Blood Sugar ranges-not checking, is diet controlled Polyuria/phagia/dipsia- no      Optho- referred at last visit, though did not hear anything regarding referral   HYPERTENSION Disease Monitoring Home BP Monitoring not checking Chest pain- no    Dyspnea- no Medications Compliance-  Taking lisinopril and coreg. Edema- no   PMH: former smoker.   ROS: Per HPI   Physical Exam Filed Vitals:   08/23/14 1405  BP: 112/72  Pulse:   Temp:     Gen: Well NAD HEENT: PERRL,  MMM Lungs: CTABL Nl WOB Heart: RRR, no murmur appreciated  Feet: no swelling, erythema, or tenderness, full ROM right first MTP joint, sensation to light touch intact, normal monofilament testing, no sores or lesions noted Exts: Non edematous BL  LE, warm and well perfused.    Assessment/Plan: Please see individual problem list.  [name], MD [place] PGY-3

## 2014-08-23 NOTE — Assessment & Plan Note (Signed)
A1c pending at time patient left office. Will await results and call patient to discuss. Has been diet controlled.

## 2014-08-25 DIAGNOSIS — E119 Type 2 diabetes mellitus without complications: Secondary | ICD-10-CM | POA: Diagnosis not present

## 2014-08-25 DIAGNOSIS — H5310 Unspecified subjective visual disturbances: Secondary | ICD-10-CM | POA: Diagnosis not present

## 2014-08-25 LAB — HM DIABETES EYE EXAM

## 2014-09-01 ENCOUNTER — Telehealth: Payer: Self-pay | Admitting: Family Medicine

## 2014-09-01 DIAGNOSIS — D649 Anemia, unspecified: Secondary | ICD-10-CM

## 2014-09-01 NOTE — Telephone Encounter (Signed)
Called patient to discuss lab results. Informed of anemia. Patient notes that he is not bleeding from rectum and notes no gross hematuria. Though he does state he has been told for over 20 years that he has blood in his urine. He states he maybe had been referred to see some one regarding this though has not gone to do this. He had a recent colonoscopy with 2 polyps, diverticulosis, and hemorrhoids noted. He denies dysuria and abdominal pain. Will have patient come in for UA, iron studies, and stool cards tomorrow.

## 2014-09-02 ENCOUNTER — Other Ambulatory Visit (INDEPENDENT_AMBULATORY_CARE_PROVIDER_SITE_OTHER): Payer: Medicare Other

## 2014-09-02 DIAGNOSIS — D649 Anemia, unspecified: Secondary | ICD-10-CM

## 2014-09-02 LAB — POCT URINALYSIS DIPSTICK
Bilirubin, UA: NEGATIVE
Glucose, UA: NEGATIVE
Ketones, UA: NEGATIVE
Leukocytes, UA: NEGATIVE
Nitrite, UA: NEGATIVE
Protein, UA: NEGATIVE
Spec Grav, UA: >=1.03
Urobilinogen, UA: 0.2
pH, UA: 5.5

## 2014-09-02 LAB — POCT UA - MICROSCOPIC ONLY

## 2014-09-02 LAB — IRON AND TIBC
%SAT: 20 % (ref 20–55)
Iron: 74 ug/dL (ref 42–165)
TIBC: 374 ug/dL (ref 215–435)
UIBC: 300 ug/dL (ref 125–400)

## 2014-09-02 LAB — FERRITIN: Ferritin: 123 ng/mL (ref 22–322)

## 2014-09-02 NOTE — Progress Notes (Unsigned)
UA,FERRITIN AND TIBC DONE TODAY Alex [name]

## 2014-09-08 ENCOUNTER — Telehealth (INDEPENDENT_AMBULATORY_CARE_PROVIDER_SITE_OTHER): Payer: Medicare Other | Admitting: Family Medicine

## 2014-09-08 DIAGNOSIS — D649 Anemia, unspecified: Secondary | ICD-10-CM

## 2014-09-08 DIAGNOSIS — R3129 Other microscopic hematuria: Secondary | ICD-10-CM

## 2014-09-08 NOTE — Telephone Encounter (Signed)
Called patient to inform of lab results. Advised of occassional RBC on urine micro. Given history of this in the past and that patiens Hgb is now anemic will refer to urology for further evaluation. Also advised that we would need to obtain stool cards to ensure that there is no blood in his stool. He reports not receiving these when he came in for labs. Will ask that nursing mail these to the patient.

## 2014-09-09 NOTE — Telephone Encounter (Signed)
Cards mailed to patient. Jansel Vonstein,CMA

## 2014-09-24 LAB — POC HEMOCCULT BLD/STL (HOME/3-CARD/SCREEN)
Card #2 Fecal Occult Blod, POC: NEGATIVE
Card #3 Fecal Occult Blood, POC: NEGATIVE
Fecal Occult Blood, POC: NEGATIVE

## 2014-09-24 NOTE — Addendum Note (Signed)
Addended by: [place] Pink on: 09/24/2014 02:11 PM   Modules accepted: Orders

## 2014-09-27 DIAGNOSIS — H5310 Unspecified subjective visual disturbances: Secondary | ICD-10-CM | POA: Diagnosis not present

## 2014-10-01 ENCOUNTER — Telehealth: Payer: Self-pay | Admitting: *Deleted

## 2014-10-01 NOTE — Telephone Encounter (Signed)
Dr. Valetta Close called stating he recently completed some lab work for patient; Hgb 12.7 and Hct 38.8.  Pt complaint of visual changes for the past five years.  Most recently some temple pain.  He will be faxing over the results for the PCP to review.  Please give him a call with questions once results are reviewed.  Derl Barrow, RN

## 2014-10-21 ENCOUNTER — Other Ambulatory Visit: Payer: Self-pay | Admitting: Family Medicine

## 2014-10-22 NOTE — Telephone Encounter (Signed)
Ok to fill 

## 2014-11-05 ENCOUNTER — Encounter: Payer: Self-pay | Admitting: Family Medicine

## 2014-11-05 ENCOUNTER — Ambulatory Visit (HOSPITAL_COMMUNITY)
Admission: RE | Admit: 2014-11-05 | Discharge: 2014-11-05 | Disposition: A | Discharge status: Home / Self Care | Payer: Medicare Other | Source: Ambulatory Visit | Attending: Family Medicine | Admitting: Family Medicine

## 2014-11-05 ENCOUNTER — Ambulatory Visit (INDEPENDENT_AMBULATORY_CARE_PROVIDER_SITE_OTHER): Payer: Medicare Other | Admitting: Family Medicine

## 2014-11-05 VITALS — BP 160/103 | HR 67 | Temp 97.9°F | Ht 67.0 in | Wt 310.0 lb

## 2014-11-05 DIAGNOSIS — R5383 Other fatigue: Secondary | ICD-10-CM

## 2014-11-05 DIAGNOSIS — R0600 Dyspnea, unspecified: Secondary | ICD-10-CM | POA: Diagnosis not present

## 2014-11-05 DIAGNOSIS — R079 Chest pain, unspecified: Secondary | ICD-10-CM

## 2014-11-05 LAB — COMPREHENSIVE METABOLIC PANEL
ALT: 32 U/L (ref 9–46)
AST: 24 U/L (ref 10–35)
Albumin: 4.1 g/dL (ref 3.6–5.1)
Alkaline Phosphatase: 66 U/L (ref 40–115)
BUN: 18 mg/dL (ref 7–25)
CO2: 23 mmol/L (ref 20–31)
Calcium: 9.1 mg/dL (ref 8.6–10.3)
Chloride: 107 mmol/L (ref 98–110)
Creat: 1.1 mg/dL (ref 0.70–1.33)
Glucose, Bld: 93 mg/dL (ref 65–99)
Potassium: 4.4 mmol/L (ref 3.5–5.3)
Sodium: 140 mmol/L (ref 135–146)
Total Bilirubin: 0.7 mg/dL (ref 0.2–1.2)
Total Protein: 7.1 g/dL (ref 6.1–8.1)

## 2014-11-05 LAB — TSH: TSH: 0.892 u[IU]/mL (ref 0.350–4.500)

## 2014-11-05 NOTE — Progress Notes (Signed)
Patient ID: Alex [name], male   DOB: 1955/12/02, 59 y.o.   MRN: [id number]  HPI:  Pt presents for a same day appointment to discuss weakness and chest pain.  Pt reports feeling weak this week. Yesterday took his BP meds and felt dizzy, like a spinning sensation. Has a hx of spinning sensation in the past. Not worse with position changes. Reports he has been tired and falling asleep quickly also for the last week. Endorses soreness in his chest in the middle, also some soreness in his back. Describes the soreness as being muscular in nature. It is reproduced by palpation of his chest and back. Denies any sharp, stabbing pain. Soreness brought on by turning certain directions. Not made worse by exertion. Denies fevers. Has had mild lower extremity swelling. Has felt somewhat short of breath. Noticed himself wheezing earlier this week. No cough, vomiting, diarrhea, nausea, abdominal pain. Nonsmoker. Hx of OSA on CPAP but reports he hasn't used CPAP in 1 month due to needing new parts for the machine. Has lasix at home but hasn't been taking regularly. No vision changes.  ROS: See HPI  [place]: hx T2DM, gout, obesity, HTN, claudication, lumbar DDD, TIA, diastolic CHF, OSA, CAD, HLD, GERD  PHYSICAL EXAM: BP 160/103 mmHg  Pulse 67  Temp(Src) 97.9 F (36.6 C) (Oral)  Ht 5\' 7"  (1.702 m)  Wt 310 lb (140.615 kg)  BMI 48.54 kg/m2 Gen: NAD, pleasant, cooperative HEENT: NCAT. PERRL. EOMI without nystagmus Heart: RRR no murmur Lungs: CTAB, NWOB, speaks in full sentences and ambulates without distress Abdomen: soft NTTP Neuro: cranial nerves II-XII tested and intact. Speech normal. Full strength bilat upper and lower ext. Normal FNF. Sensation intact throughout. Ext: 1+ pitting edema bilat lower extremities  ASSESSMENT/PLAN:  59 yo M with extensive PMhx presenting with fatigue, dizziness, chest discomfort, shortness of breath: all relatively vague in description. Etiology by history and exam unclear.    Patient ambulated through clinic today without hypoxia. EKG performed and is unchanged from prior EKGs and is without ischemic changes. Initially pt had elevated BP with difference between the two arms (initially 160/103 R and 135/97 on L arm) but on recheck the difference was only 12 systolic points. Patient does not describe pain that is consistent with aortic dissection. Pain is very clearly reproducible on exam with palpation of anterior chest and back, and with stretching torso in certain directions (like axial rotation to the R).   Pt does have known hx of CAD (with normal myoview in 2013), but strongly doubt ACS at this time given stability of EKG and clearly musculoskeletal nature of chest pain. Last echo in August 2013 with grade 1 diastolic dysfunction. Doubt PE given absence of tachycardia, hypoxia, or respiratory distress. May represent worsening of systolic or diastolic function.  Regarding dizziness/weakness, neuro exam normal today. No signs of central vertigo.  Plan:  - update echo, get CXR - check labs: CMET, CBC, vitamin D, TSH, BNP - encouraged use of CPAP - use lasix 40mg  daily - f/u with PCP in 1 week to review results & decide on further evaluation - discussed return precautions with pt at length  FOLLOW UP: F/u in 1 week for above issues  [place] J. Ardelia Mems, [place]

## 2014-11-05 NOTE — Patient Instructions (Addendum)
EKG looks fine today. Your oxygen levels are good.  Checking tests: - ultrasound of heart, chest xray - labs: liver, kidneys, electrolytes, blood counts, vitamin D, thyroid  Please get your CPAP supplies refilled and use CPAP machine. Follow up with Dr. Jerline Pain in 1 week to review these results and decide on further plan  If any worsening chest pain, shortness of breath, or any other symptoms please go to ER immediately  Be well, Dr. Ardelia Mems

## 2014-11-06 LAB — CBC
HCT: 39.6 % (ref 39.0–52.0)
Hemoglobin: 12.6 g/dL — ABNORMAL LOW (ref 13.0–17.0)
MCH: 25.5 pg — ABNORMAL LOW (ref 26.0–34.0)
MCHC: 31.8 g/dL (ref 30.0–36.0)
MCV: 80.2 fL (ref 78.0–100.0)
MPV: 10.9 fL (ref 8.6–12.4)
Platelets: 290 10*3/uL (ref 150–400)
RBC: 4.94 MIL/uL (ref 4.22–5.81)
RDW: 15.8 % — ABNORMAL HIGH (ref 11.5–15.5)
WBC: 7.8 10*3/uL (ref 4.0–10.5)

## 2014-11-06 LAB — VITAMIN D 25 HYDROXY (VIT D DEFICIENCY, FRACTURES): Vit D, 25-Hydroxy: 21 ng/mL — ABNORMAL LOW (ref 30–100)

## 2014-11-07 LAB — BRAIN NATRIURETIC PEPTIDE: Brain Natriuretic Peptide: 17.9 pg/mL (ref 0.0–100.0)

## 2014-11-08 ENCOUNTER — Other Ambulatory Visit: Payer: Self-pay

## 2014-11-08 ENCOUNTER — Ambulatory Visit (HOSPITAL_COMMUNITY): Payer: Medicare Other | Attending: Family Medicine

## 2014-11-08 DIAGNOSIS — Z87891 Personal history of nicotine dependence: Secondary | ICD-10-CM | POA: Diagnosis not present

## 2014-11-08 DIAGNOSIS — I10 Essential (primary) hypertension: Secondary | ICD-10-CM | POA: Diagnosis not present

## 2014-11-08 DIAGNOSIS — E785 Hyperlipidemia, unspecified: Secondary | ICD-10-CM | POA: Diagnosis not present

## 2014-11-08 DIAGNOSIS — I77819 Aortic ectasia, unspecified site: Secondary | ICD-10-CM | POA: Diagnosis not present

## 2014-11-08 DIAGNOSIS — R0600 Dyspnea, unspecified: Secondary | ICD-10-CM | POA: Insufficient documentation

## 2014-11-08 DIAGNOSIS — I517 Cardiomegaly: Secondary | ICD-10-CM | POA: Insufficient documentation

## 2014-11-08 DIAGNOSIS — E119 Type 2 diabetes mellitus without complications: Secondary | ICD-10-CM | POA: Insufficient documentation

## 2014-11-10 ENCOUNTER — Encounter: Payer: Self-pay | Admitting: Family Medicine

## 2014-11-10 ENCOUNTER — Telehealth: Payer: Self-pay | Admitting: Family Medicine

## 2014-11-10 MED ORDER — CHOLECALCIFEROL 1.25 MG (50000 UT) PO TABS: 1.0000 | ORAL_TABLET | ORAL | Status: DC

## 2014-11-10 NOTE — Telephone Encounter (Signed)
Called pt to discuss results from last week's visit. -echo unchanged -vit D low - needs weekly repletion -rest of labs look good.  Will rx vitamin D repletion. Will also send copy of results to patient. Pt appreciative.  Leeanne Rio, MD

## 2014-11-11 ENCOUNTER — Other Ambulatory Visit: Payer: Self-pay | Admitting: Family Medicine

## 2014-11-12 ENCOUNTER — Ambulatory Visit (INDEPENDENT_AMBULATORY_CARE_PROVIDER_SITE_OTHER): Payer: Medicare Other | Admitting: Family Medicine

## 2014-11-12 ENCOUNTER — Encounter: Payer: Self-pay | Admitting: Family Medicine

## 2014-11-12 VITALS — BP 136/82 | HR 65 | Temp 98.0°F | Ht 67.0 in | Wt 313.0 lb

## 2014-11-12 DIAGNOSIS — I10 Essential (primary) hypertension: Secondary | ICD-10-CM | POA: Diagnosis not present

## 2014-11-12 DIAGNOSIS — R5382 Chronic fatigue, unspecified: Secondary | ICD-10-CM

## 2014-11-12 DIAGNOSIS — R5383 Other fatigue: Secondary | ICD-10-CM | POA: Insufficient documentation

## 2014-11-12 MED ORDER — OMEPRAZOLE 40 MG PO CPDR: 40.0000 mg | DELAYED_RELEASE_CAPSULE | Freq: Every day | ORAL | Status: DC

## 2014-11-12 MED ORDER — LISINOPRIL 40 MG PO TABS: 40.0000 mg | ORAL_TABLET | Freq: Every day | ORAL | Status: DC

## 2014-11-12 NOTE — Progress Notes (Addendum)
    Subjective:  Alex [name] is a 59 y.o. male who presents to the [facility] today with a chief complaint of fatigue follow up.  HPI:  Fatigue.  Patient reports at least 2 week history of low energy and fatigue. Patient was seen in this clinic last week for the same problem. Blood work at that time revealed low vitamin D levels, otherwise were normal. Patient states that he has not yet started his vitamin D replacement.  Additionally, patient endorses difficulty sleeping. States that he only sleeps for about 3 hours per night. Goes to sleep around 10-11pm and wakes up around 1-3am. States that he usually falls asleep watching TV. Has been diagnosed with OSA in the past, but has not been wearing his CPAP machine lately. Denies any caffeine intake in the afternoon.   Hypertension BP Readings from Last 3 Encounters:  11/12/14 136/82  11/05/14 160/103  08/23/14 112/72   Home BP monitoring-No Compliant with medications-yes without side effects ROS-Denies any CP, HA, SOB, blurry vision, LE edema, transient weakness, orthopnea, PND.   ROS: No fevers or chills, otherwise all systems reviewed and are negative   Objective:  Physical Exam: BP 136/82 mmHg  Pulse 65  Temp(Src) 98 F (36.7 C) (Oral)  Ht 5\' 7"  (1.702 m)  Wt 313 lb (141.976 kg)  BMI 49.01 kg/m2  Gen: NAD, resting comfortably CV: RRR with no murmurs appreciated Lungs: NWOB, CTAB with no crackles, wheezes, or rhonchi GI: Obese,Normal bowel sounds present. Soft, Nontender, Nondistended. MSK: No cyanosis, or clubbing noted Skin: warm, dry Neuro: grossly normal, moves all extremities Psych: Normal affect and thought content   Assessment/Plan:  Fatigue Likely mutlifactorial in setting of insomnia, OSA poorly compliant with CPAP, and low vitamin D levels. Instructed patient to start vitamin D supplementation as previously planned. Also instructed patient to use CPAP machine - patient was agreeable to this. Discussed sleep hygiene  with patient.   Will follow up in 3 months. May consider starting melatonin at that time if patient continues to have insomnia despite good sleep hygiene.   HYPERTENSION, BENIGN ESSENTIAL Well controlled today. Will continue amlodipine and lisinopril.     [name]. Jerline Pain, [place] Resident PGY-2 11/12/2014 4:17 PM

## 2014-11-12 NOTE — Assessment & Plan Note (Signed)
Well controlled today. Will continue amlodipine and lisinopril.

## 2014-11-12 NOTE — Assessment & Plan Note (Signed)
Likely mutlifactorial in setting of insomnia, OSA poorly compliant with CPAP, and low vitamin D levels. Instructed patient to start vitamin D supplementation as previously planned. Also instructed patient to use CPAP machine - patient was agreeable to this. Discussed sleep hygiene with patient.   Will follow up in 3 months. May consider starting melatonin at that time if patient continues to have insomnia despite good sleep hygiene.

## 2014-11-12 NOTE — Patient Instructions (Signed)
Thank you for coming to the clinic today. It was nice seeing you.  For your fatigue, I think you have several things going on that are making you tired. Please start taking the Vitamin D as prescribed by Dr Ardelia Mems. This should help you gain more energy. It also sounds like you are not getting enough sleep at night. Please start wearing your CPAP machine again. It is also important that you maintain good sleep hygiene. Please limit the amount of TV you watch prior to going to sleep. Ideally, you should not watch TV for 1 hour prior to going to sleep.  Please come back in 3 months for follow up, or sooner if need anything.  Take care,  Dr Jerline Pain  Insomnia Insomnia is frequent trouble falling and/or staying asleep. Insomnia can be a long term problem or a short term problem. Both are common. Insomnia can be a short term problem when the wakefulness is related to a certain stress or worry. Long term insomnia is often related to ongoing stress during waking hours and/or poor sleeping habits. Overtime, sleep deprivation itself can make the problem worse. Every little thing feels more severe because you are overtired and your ability to cope is decreased. CAUSES   Stress, anxiety, and depression.  Poor sleeping habits.  Distractions such as TV in the bedroom.  Naps close to bedtime.  Engaging in emotionally charged conversations before bed.  Technical reading before sleep.  Alcohol and other sedatives. They may make the problem worse. They can hurt normal sleep patterns and normal dream activity.  Stimulants such as caffeine for several hours prior to bedtime.  Pain syndromes and shortness of breath can cause insomnia.  Exercise late at night.  Changing time zones may cause sleeping problems (jet lag). It is sometimes helpful to have someone observe your sleeping patterns. They should look for periods of not breathing during the night (sleep apnea). They should also look to see how long  those periods last. If you live alone or observers are uncertain, you can also be observed at a sleep clinic where your sleep patterns will be professionally monitored. Sleep apnea requires a checkup and treatment. Give your caregivers your medical history. Give your caregivers observations your family has made about your sleep.  SYMPTOMS   Not feeling rested in the morning.  Anxiety and restlessness at bedtime.  Difficulty falling and staying asleep. TREATMENT   Your caregiver may prescribe treatment for an underlying medical disorders. Your caregiver can give advice or help if you are using alcohol or other drugs for self-medication. Treatment of underlying problems will usually eliminate insomnia problems.  Medications can be prescribed for short time use. They are generally not recommended for lengthy use.  Over-the-counter sleep medicines are not recommended for lengthy use. They can be habit forming.  You can promote easier sleeping by making lifestyle changes such as:  Using relaxation techniques that help with breathing and reduce muscle tension.  Exercising earlier in the day.  Changing your diet and the time of your last meal. No night time snacks.  Establish a regular time to go to bed.  Counseling can help with stressful problems and worry.  Soothing music and white noise may be helpful if there are background noises you cannot remove.  Stop tedious detailed work at least one hour before bedtime. HOME CARE INSTRUCTIONS   Keep a diary. Inform your caregiver about your progress. This includes any medication side effects. See your caregiver regularly. Take note of:  Times when you are asleep.  Times when you are awake during the night.  The quality of your sleep.  How you feel the next day. This information will help your caregiver care for you.  Get out of bed if you are still awake after 15 minutes. Read or do some quiet activity. Keep the lights down. Wait  until you feel sleepy and go back to bed.  Keep regular sleeping and waking hours. Avoid naps.  Exercise regularly.  Avoid distractions at bedtime. Distractions include watching television or engaging in any intense or detailed activity like attempting to balance the household checkbook.  Develop a bedtime ritual. Keep a familiar routine of bathing, brushing your teeth, climbing into bed at the same time each night, listening to soothing music. Routines increase the success of falling to sleep faster.  Use relaxation techniques. This can be using breathing and muscle tension release routines. It can also include visualizing peaceful scenes. You can also help control troubling or intruding thoughts by keeping your mind occupied with boring or repetitive thoughts like the old concept of counting sheep. You can make it more creative like imagining planting one beautiful flower after another in your backyard garden.  During your day, work to eliminate stress. When this is not possible use some of the previous suggestions to help reduce the anxiety that accompanies stressful situations. MAKE SURE YOU:   Understand these instructions.  Will watch your condition.  Will get help right away if you are not doing well or get worse. Document Released: 02/24/2000 Document Revised: 05/21/2011 Document Reviewed: 03/26/2007 [facility] Patient Information 2015 [place], [place]. This information is not intended to replace advice given to you by your health care provider. Make sure you discuss any questions you have with your health care provider.

## 2014-11-19 ENCOUNTER — Other Ambulatory Visit: Payer: Self-pay | Admitting: Family Medicine

## 2014-11-22 ENCOUNTER — Telehealth: Payer: Self-pay | Admitting: Family Medicine

## 2014-11-22 DIAGNOSIS — E559 Vitamin D deficiency, unspecified: Secondary | ICD-10-CM

## 2014-11-22 NOTE — Telephone Encounter (Signed)
Need refill Cholecalciferol 50000 units tabs.  Patient is starting to experience the weakness he had before starting medication

## 2014-11-22 NOTE — Telephone Encounter (Signed)
Patient was prescribed 8 tablets to be taken once weekly on 11/10/2014. He should have several doses remaining.  [name]. Jerline Pain, [place] [facility] Resident PGY-2 11/22/2014 5:08 PM

## 2014-11-22 NOTE — Telephone Encounter (Signed)
Will forward to PCP for review. Corayma Cashatt, CMA. 

## 2014-11-23 NOTE — Telephone Encounter (Signed)
Advised pt as directed below and stated that he was taking med once a day instead of once a week and did not realize it said once a week. He stated that is the reason why he needed a refill. He stated that weakness started yesterday but that med seems to give him energy. Please advise. Keats Kingry, CMA.

## 2014-11-24 NOTE — Telephone Encounter (Signed)
The patient may be experiencing vitamin D toxicity. He should come in to the clinic in 1 week to have his level rechecked, and then we can restart his vitamin D supplementation.  [name]. Jerline Pain, [place] Resident PGY-2 11/24/2014 1:18 PM

## 2014-11-26 NOTE — Telephone Encounter (Signed)
Spoke with patient and scheduled him for 12-02-14 for labs.

## 2014-12-02 ENCOUNTER — Other Ambulatory Visit: Payer: Medicare Other

## 2014-12-02 DIAGNOSIS — E559 Vitamin D deficiency, unspecified: Secondary | ICD-10-CM | POA: Diagnosis not present

## 2014-12-02 NOTE — Progress Notes (Signed)
Vit d done today Warden/ranger

## 2014-12-03 ENCOUNTER — Encounter: Payer: Self-pay | Admitting: Family Medicine

## 2014-12-03 LAB — VITAMIN D 25 HYDROXY (VIT D DEFICIENCY, FRACTURES): Vit D, 25-Hydroxy: 38 ng/mL (ref 30–100)

## 2014-12-03 MED ORDER — VITAMIN D 50 MCG (2000 UT) PO TABS: 2000.0000 [IU] | ORAL_TABLET | Freq: Every day | ORAL | Status: DC

## 2014-12-03 NOTE — Telephone Encounter (Signed)
Patient's vitamin D level at normal level. Will start maintenance therapy with 2000 IU daily, per Endocrine Society guidelines.  [name]. Jerline Pain, [place] [facility] Resident PGY-2 12/03/2014 8:45 AM

## 2014-12-03 NOTE — Telephone Encounter (Signed)
Patient was informed of results and script.  Voiced understanding on instructions. Ranata Laughery,CMA

## 2015-01-03 DIAGNOSIS — R3129 Other microscopic hematuria: Secondary | ICD-10-CM | POA: Diagnosis not present

## 2015-01-03 DIAGNOSIS — R3121 Asymptomatic microscopic hematuria: Secondary | ICD-10-CM | POA: Diagnosis not present

## 2015-01-19 DIAGNOSIS — R3121 Asymptomatic microscopic hematuria: Secondary | ICD-10-CM | POA: Diagnosis not present

## 2015-01-19 DIAGNOSIS — R3129 Other microscopic hematuria: Secondary | ICD-10-CM | POA: Diagnosis not present

## 2015-01-25 ENCOUNTER — Other Ambulatory Visit: Payer: Self-pay | Admitting: Family Medicine

## 2015-01-25 NOTE — Telephone Encounter (Signed)
Needs refill on naproxen  [facility] on [address]

## 2015-01-25 NOTE — Telephone Encounter (Signed)
Rx filled.  [name]. Jerline Pain, [place] Resident PGY-2 01/25/2015 11:57 AM

## 2015-02-18 ENCOUNTER — Other Ambulatory Visit: Payer: Self-pay | Admitting: Family Medicine

## 2015-02-21 NOTE — Telephone Encounter (Signed)
Rx filled.  [name]. Jerline Pain, [place] Resident PGY-2 02/21/2015 1:34 PM

## 2015-03-22 ENCOUNTER — Encounter: Payer: Self-pay | Admitting: Internal Medicine

## 2015-03-22 ENCOUNTER — Ambulatory Visit (INDEPENDENT_AMBULATORY_CARE_PROVIDER_SITE_OTHER): Payer: Medicare Other | Admitting: Internal Medicine

## 2015-03-22 ENCOUNTER — Telehealth: Payer: Self-pay | Admitting: Family Medicine

## 2015-03-22 VITALS — BP 130/89 | HR 84 | Temp 97.7°F | Wt 322.8 lb

## 2015-03-22 DIAGNOSIS — I5032 Chronic diastolic (congestive) heart failure: Secondary | ICD-10-CM

## 2015-03-22 DIAGNOSIS — E118 Type 2 diabetes mellitus with unspecified complications: Secondary | ICD-10-CM

## 2015-03-22 DIAGNOSIS — R252 Cramp and spasm: Secondary | ICD-10-CM

## 2015-03-22 DIAGNOSIS — I10 Essential (primary) hypertension: Secondary | ICD-10-CM

## 2015-03-22 DIAGNOSIS — E119 Type 2 diabetes mellitus without complications: Secondary | ICD-10-CM | POA: Diagnosis not present

## 2015-03-22 LAB — POCT HEMOGLOBIN: Hemoglobin: 13.9 g/dL — AB (ref 14.1–18.1)

## 2015-03-22 NOTE — Assessment & Plan Note (Addendum)
-   Advised patient to continue taking lasix 40 mg daily, as he had crackles on lung exam - Ordered CK, given complaint of muscles cramps

## 2015-03-22 NOTE — Assessment & Plan Note (Addendum)
-   Counseled patient on low but increased risk of angioedema in African Americans, as well as renal protective effects. - Considering the above information, patient does not want to switch from lisinopril at this time.  - Advised patient to take full 40 mg of lisinopril rather than splitting the pills

## 2015-03-22 NOTE — Telephone Encounter (Signed)
Pt called and would like the doctor to change his medication. He is on Lisinopril and he was reading and he found out that you can die from this medication. He would like the doctor to change this to something else. jw

## 2015-03-22 NOTE — Assessment & Plan Note (Signed)
>>  ASSESSMENT AND PLAN FOR DIABETES TYPE 2, CONTROLLED WRITTEN ON 03/22/2015  9:50 PM BY FITZGERALD, HILLARY MOEN, MD  - Recommend Hgb A1c and follow-up visit within a month - Counseled patient that significant weight loss may reduce the number of medications he would have to be on for his health and that he may want to consider this as a motivator for weight loss - Patient states he will look into getting a gym membership through the [facility].

## 2015-03-22 NOTE — Telephone Encounter (Signed)
Clld pt - Pt advsd his main concerns regarding taking Lisinopril 40 mg is swelling and he knows someone that has died from taking the medication. Pt advsd he has not been taking his Lasix 40 mg as prescribed due to experiencing some cramping. Advsd pt to schedule a same day appt today to discuss his concerns. Pt stated he would.

## 2015-03-22 NOTE — Assessment & Plan Note (Signed)
-   Recommend Hgb A1c and follow-up visit within a month - Counseled patient that significant weight loss may reduce the number of medications he would have to be on for his health and that he may want to consider this as a motivator for weight loss - Patient states he will look into getting a gym membership through the [facility].

## 2015-03-22 NOTE — Patient Instructions (Signed)
Alex [name],  I could hear fluid on your lungs today, so please restart lasix to help your breathing and your heart pump better.  As for blood pressure, we reviewed the very low risk of taking lisinopril. Your blood pressure was mildly elevated, so please take 40 mg lisinopril and do not cut the pill in half. Below are symptoms of angioedema. If you ever feel like your throat is swelling up, you should seek emergency care.  Please follow up within the month to keep discussing diabetes management and check your fluid status.  Thank you, Dr. Ola Spurr  Angioedema Angioedema is sudden puffiness (swelling), often of the skin. It can happen:  On your face or privates (genitals).  In your belly (abdomen) or other body parts. It usually happens quickly and gets better in 1 or 2 days. It often starts at night and is found when you wake up. You may get red, itchy patches of skin (hives). Attacks can be dangerous if your breathing passages get puffy. The condition may happen only once, or it can come back at random times. It may happen for several years before it goes away for good. HOME CARE  Only take medicines as told by your doctor.  Always carry your emergency allergy medicines with you.  Wear a medical bracelet as told by your doctor.  Avoid things that you know will cause attacks (triggers). GET HELP IF:  You have another attack.  Your attacks happen more often or get worse.  The condition was passed to you by your parents and you want to have children. GET HELP RIGHT AWAY IF:   Your mouth, tongue, or lips are very puffy.  You have trouble breathing.  You have trouble swallowing.  You pass out (faint). MAKE SURE YOU:   Understand these instructions.  Will watch your condition.  Will get help right away if you are not doing well or get worse.   This information is not intended to replace advice given to you by your health care provider. Make sure you discuss any  questions you have with your health care provider.   Document Released: 02/14/2009 Document Revised: 12/17/2012 Document Reviewed: 10/20/2012 Elsevier Interactive Patient Education [employer].

## 2015-03-22 NOTE — Progress Notes (Signed)
Subjective: Alex [name] is a 60 y.o. male patient of Alex Chyle, MD, presenting for concern about his medications.  PMH: HTN, diastolic heart failure, HLD, history TIA  HTN and HFpEF: - Prescribed carvedilol 25 mg, lisinopril 40 mg, amlodipine 10 mg, lasix 40 mg - Concerned about lisinopril because a friend told him about someone he knew who died of angioedema on an ACEi - Started cutting his lisinopril in half the past 2 days because of concerns for side effects - Also has not been taking his lasix regularly for over 6 months; last took 1 dose last week. Says it makes him feel cramping in his legs and abdomen.  - Has also not been taking his statin for a year, despite history of TIA - Says weight has been fluctuating but is at a high now. Has not been below 300 for years.  - Felt some SOB about a month ago but is feeling better now  T2DM: - Has not been taking metformin for over 6 months. States he "feels better when off" the medication. Could not describe specific symptoms that he felt while on metformin; denied any GI complaints.  - Last hgb A1c was 6.2 in 08/23/14 - Has a glider at home he could use for exercise but gets bored by the workout - He used to walk around his neighborhood, but the weather is too cold now - Thinks he could get a gym membership through [employer] at the [employer]: no fevers/chills, no n/v/d - Former smoker  Objective: BP 130/89 mmHg  Pulse 84  Temp(Src) 97.7 F (36.5 C) (Oral)  Wt 322 lb 12.8 oz (146.421 kg) Gen: Obese 60 y.o. male in no distress HEENT: No erythema of oropharynx, mild erythema of nasal turbinates, no lymphadenopathy Cardiac: RRR, S1, S2, no m/r/g Pulm: Bibasilar crackles, no wheezes Abdomen: +BS, no TTP, soft, obese Extremities: No LE edema  Assessment/Plan: Alex [name] is a 60 y.o. male here for discussion about medications. Provided reassurance about his blood pressure medications.  Recommended follow-up within the  month to discuss management of his diabetes and medication non-adherence further.    HYPERTENSION, BENIGN ESSENTIAL - Counseled patient on low but increased risk of angioedema in African Americans, as well as renal protective effects. - Patient does not want to change his medications at this time.  - Advised patient to take full 40 mg of lisinopril  Diastolic heart failure - Advised patient to continue taking lasix 40 mg daily, as he had crackles on lung exam - Ordered CK, given complaint of muscles cramps  Diabetes type 2, controlled - Recommend Hgb A1c and follow-up visit within a month - Counseled patient that significant weight loss may reduce the number of medications he would have to be on for his health and that he may want to consider this as a motivator for weight loss - Patient states he will look into getting a gym membership through the [facility].    Olene Floss, MD [facility], PGY-1

## 2015-03-28 ENCOUNTER — Other Ambulatory Visit: Payer: Self-pay | Admitting: Urology

## 2015-03-28 DIAGNOSIS — Z0000 Encounter for general adult medical examination without abnormal findings: Secondary | ICD-10-CM | POA: Diagnosis not present

## 2015-03-28 DIAGNOSIS — N281 Cyst of kidney, acquired: Secondary | ICD-10-CM | POA: Diagnosis not present

## 2015-03-28 DIAGNOSIS — E279 Disorder of adrenal gland, unspecified: Secondary | ICD-10-CM | POA: Diagnosis not present

## 2015-03-28 DIAGNOSIS — R3121 Asymptomatic microscopic hematuria: Secondary | ICD-10-CM | POA: Diagnosis not present

## 2015-03-29 ENCOUNTER — Telehealth: Payer: Self-pay | Admitting: *Deleted

## 2015-03-29 NOTE — Telephone Encounter (Signed)
Called patient to offer flu vaccine. Scheduled patient to receive flu vaccine on 04/01/2015 at 10 AM. Adessa Primiano, Orvis Brill, RN

## 2015-04-01 ENCOUNTER — Ambulatory Visit (INDEPENDENT_AMBULATORY_CARE_PROVIDER_SITE_OTHER): Payer: Medicare Other | Admitting: *Deleted

## 2015-04-01 DIAGNOSIS — Z23 Encounter for immunization: Secondary | ICD-10-CM | POA: Diagnosis not present

## 2015-04-05 ENCOUNTER — Encounter: Payer: Self-pay | Admitting: Family Medicine

## 2015-04-05 ENCOUNTER — Ambulatory Visit (INDEPENDENT_AMBULATORY_CARE_PROVIDER_SITE_OTHER): Payer: Medicare Other | Admitting: Family Medicine

## 2015-04-05 VITALS — BP 128/82 | HR 65 | Temp 97.8°F | Wt 322.4 lb

## 2015-04-05 DIAGNOSIS — E118 Type 2 diabetes mellitus with unspecified complications: Secondary | ICD-10-CM

## 2015-04-05 DIAGNOSIS — E785 Hyperlipidemia, unspecified: Secondary | ICD-10-CM

## 2015-04-05 DIAGNOSIS — I10 Essential (primary) hypertension: Secondary | ICD-10-CM

## 2015-04-05 DIAGNOSIS — E119 Type 2 diabetes mellitus without complications: Secondary | ICD-10-CM

## 2015-04-05 LAB — POCT GLYCOSYLATED HEMOGLOBIN (HGB A1C): Hemoglobin A1C: 6.6

## 2015-04-05 NOTE — Assessment & Plan Note (Signed)
>>  ASSESSMENT AND PLAN FOR DIABETES TYPE 2, CONTROLLED WRITTEN ON 04/05/2015  2:02 PM BY PARKER, CALEB M, MD  A1c slightly elevated to 6.6 today. Discussed lifestyle modifications with patient as he would wish to avoid additional medications at this time. Encouraged patient to exercise when possible and to also avoid a high-carb diet. Patient said that he would try to start walking more. Will recheck in 3 months, will re-address starting metformin.

## 2015-04-05 NOTE — Assessment & Plan Note (Signed)
Well controlled today. Continue amlodipine, lisinopril, and coreg.

## 2015-04-05 NOTE — Progress Notes (Signed)
Subjective:  Alex [name] is a 60 y.o. male who presents to the [facility] today with a chief complaint of DM follow up   HPI:  Diabetes Patient currently diet-controlled. Increased urination due to lasix. No polydipsia. No chest pain or shortness of breath. Has been eating more unhealthy lately due to holidays. Not currently exercising.   Hypertension BP Readings from Last 3 Encounters:  04/05/15 128/82  03/22/15 130/89  11/12/14 136/82   Home BP monitoring-Yes Compliant with medications-yes, without side effects ROS-Denies any CP, HA, SOB, blurry vision, LE edema, transient weakness, orthopnea, PND.   Hyperlipidemia. Not currently on any medications. Was on atorvastatin in the past but did not tolerate due to myalgias.   ROS: Per HPI  PMH:  The following were reviewed and entered/updated in epic: Past Medical History  Diagnosis Date  . HTN (hypertension)   . Diabetes mellitus ([place])   . GERD (gastroesophageal reflux disease)   . CAD (coronary artery disease)     Cardiac cath 2002 with 25% distal RCA stenosis.   . Hyperlipidemia   . Asthma   . MI (myocardial infarction) ([place]) 2009    no stent placement  . Gout   . Obesity   . TIA (transient ischemic attack) 2013  . Esophageal stricture   . OA (osteoarthritis)   . Insomnia   . DDD (degenerative disc disease), lumbar   . Gastritis 2010  . Seasonal allergies   . OSA on CPAP    Patient Active Problem List   Diagnosis Date Noted  . Fatigue 11/12/2014  . Vision changes 07/23/2014  . Right foot strain 05/14/2014  . Elbow pain 02/03/2014  . GERD (gastroesophageal reflux disease) 12/17/2013  . Seasonal allergies 06/15/2013  . Dyspnea 05/16/2013  . Headache 12/03/2012  . Internal hemorrhoids with other complication [id number]  . Coronary artery disease 07/14/2012  . OSA (obstructive sleep apnea) 06/02/2012  . Chest pain 05/06/2012  . Diastolic heart failure ([place]) 04/02/2012  . History of tobacco use 02/22/2012    . Osteoarthritis 02/22/2012  . History of TIA (transient ischemic attack) 02/22/2012  . OBESITY 07/27/2009  . INTERMITTENT CLAUDICATION 02/09/2009  . [place] DISEASE, LUMBAR SPINE 08/04/2008  . ESOPHAGEAL STRICTURE 05/20/2008  . Diabetes type 2, controlled ([place]) 02/23/2008  . HLD (hyperlipidemia) 02/23/2008  . Gout 02/23/2008  . HYPERTENSION, BENIGN ESSENTIAL 02/23/2008   Past Surgical History  Procedure Laterality Date  . Vasectomy    . Nasal sinus surgery    . Esophagogastroduodenoscopy      multiple     Objective:  Physical Exam: BP 128/82 mmHg  Pulse 65  Temp(Src) 97.8 F (36.6 C) (Oral)  Wt 322 lb 6.4 oz (146.24 kg)  Gen: NAD, resting comfortably CV: RRR with no murmurs appreciated Pulm: NWOB, CTAB with no crackles, wheezes, or rhonchi GI: Normal bowel sounds present. Soft, Nontender, Nondistended. MSK: no edema, cyanosis, or clubbing noted Skin: warm, dry Neuro: grossly normal, moves all extremities Psych: Normal affect and thought content  Results for orders placed or performed in visit on 04/05/15 (from the past 72 hour(s))  POCT glycosylated hemoglobin (Hb A1C)     Status: None   Collection Time: 04/05/15  1:38 PM  Result Value Ref Range   Hemoglobin A1C 6.6      Assessment/Plan:  Diabetes type 2, controlled A1c slightly elevated to 6.6 today. Discussed lifestyle modifications with patient as he would wish to avoid additional medications at this time. Encouraged patient to exercise when possible  and to also avoid a high-carb diet. Patient said that he would try to start walking more. Will recheck in 3 months, will re-address starting metformin.   HYPERTENSION, BENIGN ESSENTIAL Well controlled today. Continue amlodipine, lisinopril, and coreg.  HLD (hyperlipidemia) Patient not currently on a statin due to previous intertolerance. LDL in 05/2014 fairly well controlled. Given patient's history of DM and CAD he would benefit from a statin. Discussed  this with patient but he would wish to avoid additional medications at the current time. Will recheck lipid panel at follow up in 3 months. Will discuss restarting statin therapy at that time - likely a moderate intensity statin such as pravastatin or simvastatin to avoid side effects.     [name]. Jerline Pain, [facility] Resident PGY-2 04/05/2015 2:06 PM

## 2015-04-05 NOTE — Patient Instructions (Signed)
Your blood sugar was a little above what it was last time, but still within range. Continue to work on [employer] and exercise.  Your blood pressure looked good today. Continue your current medications.  I am glad that everything is going fairly well.  Please come back in 3 months for a recheck on your blood sugar and blood tests. We will check your cholesterol at that time also and discuss possibly starting back a cholesterol medication.  Take care,  Dr Jerline Pain

## 2015-04-05 NOTE — Assessment & Plan Note (Signed)
Patient not currently on a statin due to previous intertolerance. LDL in 05/2014 fairly well controlled. Given patient's history of DM and CAD he would benefit from a statin. Discussed this with patient but he would wish to avoid additional medications at the current time. Will recheck lipid panel at follow up in 3 months. Will discuss restarting statin therapy at that time - likely a moderate intensity statin such as pravastatin or simvastatin to avoid side effects.

## 2015-04-05 NOTE — Assessment & Plan Note (Signed)
A1c slightly elevated to 6.6 today. Discussed lifestyle modifications with patient as he would wish to avoid additional medications at this time. Encouraged patient to exercise when possible and to also avoid a high-carb diet. Patient said that he would try to start walking more. Will recheck in 3 months, will re-address starting metformin.

## 2015-04-11 ENCOUNTER — Ambulatory Visit (HOSPITAL_COMMUNITY)
Admission: RE | Admit: 2015-04-11 | Discharge: 2015-04-11 | Disposition: A | Discharge status: Home / Self Care | Payer: Medicare Other | Source: Ambulatory Visit | Attending: Urology | Admitting: Urology

## 2015-04-11 ENCOUNTER — Inpatient Hospital Stay (HOSPITAL_COMMUNITY)
Admission: RE | Admit: 2015-04-11 | Discharge: 2015-04-11 | Disposition: A | Discharge status: Home / Self Care | Payer: Medicare Other | Source: Ambulatory Visit | Attending: Urology | Admitting: Urology

## 2015-04-11 ENCOUNTER — Other Ambulatory Visit: Payer: Self-pay | Admitting: Urology

## 2015-04-11 DIAGNOSIS — N281 Cyst of kidney, acquired: Secondary | ICD-10-CM

## 2015-04-18 ENCOUNTER — Other Ambulatory Visit: Payer: Medicare Other

## 2015-05-23 ENCOUNTER — Other Ambulatory Visit: Payer: Self-pay | Admitting: *Deleted

## 2015-05-23 MED ORDER — AMLODIPINE BESYLATE 10 MG PO TABS: 10.0000 mg | ORAL_TABLET | Freq: Every day | ORAL | Status: DC

## 2015-05-23 MED ORDER — CARVEDILOL 25 MG PO TABS: 25.0000 mg | ORAL_TABLET | Freq: Two times a day (BID) | ORAL | Status: DC

## 2015-05-23 NOTE — Telephone Encounter (Signed)
Rx filled.  [name]. Jerline Pain, [facility] [place] [facility] Resident PGY-2 05/23/2015 2:43 PM

## 2015-06-21 ENCOUNTER — Telehealth: Payer: Self-pay | Admitting: Family Medicine

## 2015-06-21 DIAGNOSIS — G4733 Obstructive sleep apnea (adult) (pediatric): Secondary | ICD-10-CM

## 2015-06-21 NOTE — Telephone Encounter (Signed)
Pt called and said that he needs lifetime orders for his mask, tubing, filters, and chamber for his sleep apnea machine. He uses [facility]. jw

## 2015-06-21 NOTE — Telephone Encounter (Signed)
Will forward to MD. Jazziel Fitzsimmons,CMA  

## 2015-06-22 NOTE — Telephone Encounter (Signed)
Message sent to Darlina Guys with [facility] to see if office note from January will work for this order or if patient will need to come in for a more recent follow up.  Syesha Thaw,CMA

## 2015-06-22 NOTE — Telephone Encounter (Signed)
Orders placed.  [name]. Jerline Pain, [place] [facility] Resident PGY-2 06/22/2015 3:01 PM

## 2015-06-27 ENCOUNTER — Telehealth: Payer: Self-pay | Admitting: Family Medicine

## 2015-06-27 NOTE — Telephone Encounter (Signed)
Pt has called [facility] to find out the status of his Cpap supplies.  He was told they [facility]) had not received anything about getting supplies.  Please advise

## 2015-06-28 NOTE — Telephone Encounter (Signed)
Spoke with patient and he states that he spoke with [facility] again regarding these supplies and was able to order them after all.  Patient states that he has a lifetime script for the supplies.  He did make an appt for a diabetes follow up. Ionia Schey,CMA

## 2015-07-12 ENCOUNTER — Ambulatory Visit (INDEPENDENT_AMBULATORY_CARE_PROVIDER_SITE_OTHER): Payer: PPO | Admitting: Family Medicine

## 2015-07-12 ENCOUNTER — Encounter: Payer: Self-pay | Admitting: Family Medicine

## 2015-07-12 VITALS — BP 134/80 | HR 85 | Temp 97.9°F | Ht 67.0 in | Wt 329.0 lb

## 2015-07-12 DIAGNOSIS — E785 Hyperlipidemia, unspecified: Secondary | ICD-10-CM | POA: Diagnosis not present

## 2015-07-12 DIAGNOSIS — E119 Type 2 diabetes mellitus without complications: Secondary | ICD-10-CM | POA: Diagnosis not present

## 2015-07-12 DIAGNOSIS — M1990 Unspecified osteoarthritis, unspecified site: Secondary | ICD-10-CM

## 2015-07-12 DIAGNOSIS — I10 Essential (primary) hypertension: Secondary | ICD-10-CM

## 2015-07-12 LAB — POCT GLYCOSYLATED HEMOGLOBIN (HGB A1C): Hemoglobin A1C: 6.5

## 2015-07-12 MED ORDER — SIMVASTATIN 20 MG PO TABS: 20.0000 mg | ORAL_TABLET | Freq: Every day | ORAL | Status: DC

## 2015-07-12 NOTE — Assessment & Plan Note (Signed)
Will start simvastatin today as patient has been intolerant of atorvastatin in the past. Will follow up at next visit. If tolerating well, will increase dose. Needs repeat lipid panel.

## 2015-07-12 NOTE — Assessment & Plan Note (Signed)
A1c 6.5 today. Discussed metformin today, though patient again deferred. Discussed lifestyle modifications. Patient said he would try exercising more often. Will follow up A1c in 3-6 months. If increasing, will discuss restarting metformin.

## 2015-07-12 NOTE — Progress Notes (Signed)
    Subjective:  Alex [name] is a 60 y.o. male who presents to the [facility] today with a chief complaint of T2DM.   HPI:  T2DM Diet controlled. No polydipsia. Increased frequency due to lasix. No chest pain or shortness of breath. Has been trying to exercise more and eat more healthy.   Hypertension BP Readings from Last 3 Encounters:  07/12/15 134/80  04/05/15 128/82  03/22/15 130/89   Home BP monitoring-Yes Compliant with medications-yes, without side effects ROS-Denies any CP, HA, SOB, blurry vision, LE edema, transient weakness, orthopnea, PND.   HLD Not currently on any medications.   Osteoarthritis Located in knees and shoulders. Overall stable. Using naproxen as needed which helps with the pain.   ROS: Per HPI  PMH: Smoking history reviewed.    Objective:  Physical Exam: BP 134/80 mmHg  Pulse 85  Temp(Src) 97.9 F (36.6 C) (Oral)  Ht 5\' 7"  (1.702 m)  Wt 329 lb (149.233 kg)  BMI 51.52 kg/m2  Gen: NAD, resting comfortably CV: RRR with no murmurs appreciated Pulm: NWOB, CTAB with no crackles, wheezes, or rhonchi GI: Normal bowel sounds present. Soft, Nontender, Nondistended. MSK: no edema, cyanosis, or clubbing noted Skin: warm, dry Neuro: grossly normal, moves all extremities Psych: Normal affect and thought content  Results for orders placed or performed in visit on 07/12/15 (from the past 72 hour(s))  HgB A1c     Status: None   Collection Time: 07/12/15  3:59 PM  Result Value Ref Range   Hemoglobin A1C 6.5      Assessment/Plan:  Diabetes type 2, controlled A1c 6.5 today. Discussed metformin today, though patient again deferred. Discussed lifestyle modifications. Patient said he would try exercising more often. Will follow up A1c in 3-6 months. If increasing, will discuss restarting metformin.   HYPERTENSION, BENIGN ESSENTIAL Well controlled today. Continue amlodipine, lisinopril, and coreg.  HLD (hyperlipidemia) Will start simvastatin today as  patient has been intolerant of atorvastatin in the past. Will follow up at next visit. If tolerating well, will increase dose. Needs repeat lipid panel.   Osteoarthritis Refilled naproxen today. Patient is only using very occasionally. Consider scheduled tylenol if symptoms worsen. Can discuss using only tylenol prn at next office visit.   [name]. Jerline Pain, [place] Resident PGY-2 07/12/2015 4:42 PM

## 2015-07-12 NOTE — Assessment & Plan Note (Signed)
>>  ASSESSMENT AND PLAN FOR DIABETES TYPE 2, CONTROLLED WRITTEN ON 07/12/2015  4:39 PM BY PARKER, 43 M, MD  A1c 6.5 today. Discussed metformin today, though patient again deferred. Discussed lifestyle modifications. Patient said he would try exercising more often. Will follow up A1c in 3-6 months. If increasing, will discuss restarting metformin.

## 2015-07-12 NOTE — Assessment & Plan Note (Signed)
Refilled naproxen today. Patient is only using very occasionally. Consider scheduled tylenol if symptoms worsen. Can discuss using only tylenol prn at next office visit.

## 2015-07-12 NOTE — Assessment & Plan Note (Signed)
Well controlled today. Continue amlodipine, lisinopril, and coreg.

## 2015-07-12 NOTE — Patient Instructions (Signed)
We will start a cholesterol medication today. Please let us know if you have any side effects.  If you change your mind about metformin, please let us know.  Please come back in 3-6 months, or sooner if you need anything.  Take care,  Dr Jerline Pain

## 2015-07-22 ENCOUNTER — Other Ambulatory Visit: Payer: PPO

## 2015-07-29 ENCOUNTER — Inpatient Hospital Stay: Admission: RE | Admit: 2015-07-29 | Payer: PPO | Source: Ambulatory Visit

## 2015-08-01 ENCOUNTER — Telehealth: Payer: Self-pay | Admitting: Family Medicine

## 2015-08-01 NOTE — Telephone Encounter (Signed)
Pt called because he said that he is on 3 blood pressure medication Lisinopril, Carvedilol, and Amlodipine. He said that he is having heart flutters and wanted to know if this is a side effects or something else going on. Please call to discuss. jw

## 2015-08-01 NOTE — Telephone Encounter (Signed)
Will route to MD

## 2015-08-01 NOTE — Telephone Encounter (Signed)
Palpitations are not a common a side effect from those medications. If he is still having them, he needs to make an appointment to see [place] or schedule an appointment with his cardiologist.  [name]. Jerline Pain, [place] [facility] Resident PGY-2 08/01/2015 4:01 PM

## 2015-08-02 NOTE — Telephone Encounter (Signed)
Informed pt heart flutters are not side effects of his current blood pressure medications, I advised pt to make an apt with [place] or his cardiologist. Pt voiced understanding. Page, cma.

## 2015-09-29 ENCOUNTER — Other Ambulatory Visit: Payer: Self-pay | Admitting: Family Medicine

## 2015-09-29 ENCOUNTER — Ambulatory Visit (INDEPENDENT_AMBULATORY_CARE_PROVIDER_SITE_OTHER): Payer: PPO | Admitting: Family Medicine

## 2015-09-29 VITALS — BP 135/87 | HR 66 | Temp 98.3°F | Ht 67.0 in | Wt 329.0 lb

## 2015-09-29 DIAGNOSIS — M1990 Unspecified osteoarthritis, unspecified site: Secondary | ICD-10-CM

## 2015-09-29 DIAGNOSIS — E785 Hyperlipidemia, unspecified: Secondary | ICD-10-CM | POA: Diagnosis not present

## 2015-09-29 DIAGNOSIS — E559 Vitamin D deficiency, unspecified: Secondary | ICD-10-CM

## 2015-09-29 DIAGNOSIS — Z1159 Encounter for screening for other viral diseases: Secondary | ICD-10-CM | POA: Diagnosis not present

## 2015-09-29 DIAGNOSIS — R5383 Other fatigue: Secondary | ICD-10-CM | POA: Diagnosis not present

## 2015-09-29 NOTE — Assessment & Plan Note (Signed)
Check lipid panel today. Discussed importance of statin with patient today, however he deferred. Will readdress at next office visit. Consider starting pravastatin at that time (has tolerated well in the past).

## 2015-09-29 NOTE — Assessment & Plan Note (Signed)
Chronic problem for patient. Likely multifactorial in setting of OSA and insomnia. Will check CBC and TSH today.

## 2015-09-29 NOTE — Assessment & Plan Note (Addendum)
Neck pain likely secondary to OA. Continue naproxen and tylenol as needed. Will check ANA today to rule out rheumatologic causes.

## 2015-09-29 NOTE — Patient Instructions (Addendum)
I think you may have some arthritis in your neck causing your symptoms.  We will check blood work today for other causes of your fatigue and pain  Please come back soon for your regular visit.  Take care,  Dr Jerline Pain

## 2015-09-29 NOTE — Progress Notes (Signed)
    Subjective:  Alex [name] is a 60 y.o. male who presents to the [facility] today with a chief complaint of neck pain and fatigue.   HPI:  Neck Pain Patient with neck pain for the past few months. Pain does not radiate. Worse with movement. No fevers or chills. No weakness or numbness. Has tried naproxen which helps with the pain. Patient is concerned that he may have lupus. No rashes.   Fatigue Chronic problem. Has been a little worse recently. Endorses good compliance with CPAP. No daytime somnolence. Mild constipation, no diarrhea.   HLD Recently stopped simvastatin due to muscle cramps. Does not currently want to try a different statin medication.   ROS: Per HPI  PMH: Smoking history reviewed.    Objective:  Physical Exam: BP 135/87 mmHg  Pulse 66  Temp(Src) 98.3 F (36.8 C) (Oral)  Ht 5\' 7"  (1.702 m)  Wt 329 lb (149.233 kg)  BMI 51.52 kg/m2  Gen: NAD, resting comfortably Neck: FROM in all directions. Nontender to palpation. No bony tenderness. CV: RRR with no murmurs appreciated Pulm: NWOB, CTAB with no crackles, wheezes, or rhonchi GI: Normal bowel sounds present. Soft, Nontender, Nondistended. Skin: warm, dry Neuro: grossly normal, moves all extremities Psych: Normal affect and thought content  Assessment/Plan:  Osteoarthritis Neck pain likely secondary to OA. Continue naproxen and tylenol as needed. Will check ANA today to rule out rheumatologic causes.   Fatigue Chronic problem for patient. Likely multifactorial in setting of OSA and insomnia. Will check CBC and TSH today.   HLD (hyperlipidemia) Check lipid panel today. Discussed importance of statin with patient today, however he deferred. Will readdress at next office visit. Consider starting pravastatin at that time (has tolerated well in the past).   [name]. Jerline Pain, [place] [facility] Resident PGY-3 09/29/2015 5:31 PM

## 2015-09-30 LAB — BASIC METABOLIC PANEL
BUN: 18 mg/dL (ref 7–25)
CO2: 21 mmol/L (ref 20–31)
Calcium: 9.2 mg/dL (ref 8.6–10.3)
Chloride: 105 mmol/L (ref 98–110)
Creat: 1.11 mg/dL (ref 0.70–1.25)
Glucose, Bld: 98 mg/dL (ref 65–99)
Potassium: 4 mmol/L (ref 3.5–5.3)
Sodium: 139 mmol/L (ref 135–146)

## 2015-09-30 LAB — TSH: TSH: 1 mIU/L (ref 0.40–4.50)

## 2015-09-30 LAB — CBC
HCT: 37.7 % — ABNORMAL LOW (ref 38.5–50.0)
Hemoglobin: 12.4 g/dL — ABNORMAL LOW (ref 13.2–17.1)
MCH: 25.5 pg — ABNORMAL LOW (ref 27.0–33.0)
MCHC: 32.9 g/dL (ref 32.0–36.0)
MCV: 77.4 fL — ABNORMAL LOW (ref 80.0–100.0)
MPV: 10.2 fL (ref 7.5–12.5)
Platelets: 288 10*3/uL (ref 140–400)
RBC: 4.87 MIL/uL (ref 4.20–5.80)
RDW: 16 % — ABNORMAL HIGH (ref 11.0–15.0)
WBC: 7.9 10*3/uL (ref 3.8–10.8)

## 2015-09-30 LAB — LIPID PANEL
Cholesterol: 204 mg/dL — ABNORMAL HIGH (ref 125–200)
HDL: 25 mg/dL — ABNORMAL LOW (ref >=40)
LDL Cholesterol: 118 mg/dL (ref <130)
Total CHOL/HDL Ratio: 8.2 Ratio — ABNORMAL HIGH (ref <=5.0)
Triglycerides: 303 mg/dL — ABNORMAL HIGH (ref <150)
VLDL: 61 mg/dL — ABNORMAL HIGH (ref <30)

## 2015-09-30 LAB — HEPATITIS C ANTIBODY: HCV Ab: NEGATIVE

## 2015-09-30 LAB — VITAMIN D 25 HYDROXY (VIT D DEFICIENCY, FRACTURES): Vit D, 25-Hydroxy: 19 ng/mL — ABNORMAL LOW (ref 30–100)

## 2015-10-03 LAB — ANA: Anti Nuclear Antibody(ANA): NEGATIVE

## 2015-10-04 ENCOUNTER — Telehealth: Payer: Self-pay | Admitting: Family Medicine

## 2015-10-04 MED ORDER — CHOLECALCIFEROL 25 MCG (1000 UT) PO CAPS: 1000.0000 [IU] | ORAL_CAPSULE | Freq: Every day | ORAL | 2 refills | Status: DC

## 2015-10-04 NOTE — Telephone Encounter (Signed)
Called patient to discuss results. Remarkable for low vitamin D and elevated lipids. Will start Vitamin D supplementation. Patient deferred statin.   [name]. Jerline Pain, [place] Resident PGY-3 10/04/2015 9:47 AM

## 2015-10-04 NOTE — Telephone Encounter (Signed)
Entered in error

## 2015-11-08 ENCOUNTER — Other Ambulatory Visit: Payer: Self-pay | Admitting: Family Medicine

## 2015-11-08 ENCOUNTER — Other Ambulatory Visit: Payer: Self-pay | Admitting: *Deleted

## 2015-11-08 MED ORDER — NAPROXEN 500 MG PO TABS: ORAL_TABLET | ORAL | 0 refills | Status: DC

## 2015-12-08 ENCOUNTER — Telehealth: Payer: Self-pay | Admitting: Family Medicine

## 2015-12-08 NOTE — Telephone Encounter (Signed)
Pt informed and voiced understanding

## 2015-12-08 NOTE — Telephone Encounter (Signed)
He can take anti-inflammatories such as ibuprofen or naproxen as needed for the pain. Typically colds last for 1-2 weeks at the most. If he is still having significant symptoms, he needs to come in for evaluation.  Alex [name]. Jerline Pain, [place] Resident PGY-3 12/08/2015 11:44 AM

## 2015-12-08 NOTE — Telephone Encounter (Signed)
Pt states had a cold about 3 weeks ago, pt is still experiencing a sore throat and would like to know what to take for it. Pt can be reached [phone] or [phone]. Please advise. Thanks! ep

## 2016-01-13 ENCOUNTER — Telehealth: Payer: Self-pay | Admitting: Family Medicine

## 2016-01-13 ENCOUNTER — Encounter: Payer: Self-pay | Admitting: Family Medicine

## 2016-01-13 ENCOUNTER — Ambulatory Visit (INDEPENDENT_AMBULATORY_CARE_PROVIDER_SITE_OTHER): Payer: PPO | Admitting: Family Medicine

## 2016-01-13 ENCOUNTER — Ambulatory Visit (HOSPITAL_COMMUNITY)
Admission: RE | Admit: 2016-01-13 | Discharge: 2016-01-13 | Disposition: A | Discharge status: Home / Self Care | Payer: PPO | Source: Ambulatory Visit | Attending: Family Medicine | Admitting: Family Medicine

## 2016-01-13 VITALS — BP 143/95 | HR 84 | Temp 98.0°F | Wt 323.0 lb

## 2016-01-13 DIAGNOSIS — R059 Cough, unspecified: Secondary | ICD-10-CM

## 2016-01-13 DIAGNOSIS — R0989 Other specified symptoms and signs involving the circulatory and respiratory systems: Secondary | ICD-10-CM | POA: Diagnosis not present

## 2016-01-13 DIAGNOSIS — R0600 Dyspnea, unspecified: Secondary | ICD-10-CM | POA: Diagnosis not present

## 2016-01-13 DIAGNOSIS — R05 Cough: Secondary | ICD-10-CM | POA: Insufficient documentation

## 2016-01-13 DIAGNOSIS — J984 Other disorders of lung: Secondary | ICD-10-CM | POA: Diagnosis not present

## 2016-01-13 DIAGNOSIS — J069 Acute upper respiratory infection, unspecified: Secondary | ICD-10-CM | POA: Insufficient documentation

## 2016-01-13 MED ORDER — DM-GUAIFENESIN ER 30-600 MG PO TB12: 1.0000 | ORAL_TABLET | Freq: Two times a day (BID) | ORAL | 0 refills | Status: DC

## 2016-01-13 NOTE — Telephone Encounter (Signed)
Called Mr. Petricca and let him know that he does not have any infection.

## 2016-01-13 NOTE — Assessment & Plan Note (Signed)
One week of productive cough, dry and injected eyes, sore throat and generally feeling lousy.  No fevers or myalgias. No signs or symptoms of more serious pathology like pulmonary embolism.  Exam significant only for bibasilar crackles not noticed on previous exam on August 2017. Patient does have a history of grade 1 diastolic dysfunction and is supposed to be on 40 mg Lasix daily.  This could explain the crackles, however he does not have any fluid overload in his legs.  Other possible causes of crackles could be bibasilar pneumonia, however patient would appear much sicker. Could also be a viral bronchitis. - Prescribed Mucinex DM  - Ordered two-view chest x-ray and recommended that he get this today and I will review it and prescribe him antibiotics if appropriate

## 2016-01-13 NOTE — Progress Notes (Signed)
    Subjective:  Alex [name] is a 60 y.o. male who presents to the [facility] today with a chief complaint of one week of cough, sore throat and shortness of breath  HPI:  Upper respiratory symptoms: One week of productive cough with yellow sputum without sore throat, fevers/chills or myalgias.   Shortness of breath: She notes occasional shortness of breath for the last week. He does have history of heart failure, grade 1 diastolic dysfunction with an EF of 55-60% last echo was in August 2017. He denies any swelling in his lower extremities and he was instructed to take Lasix 40 mg daily which she has not been taking. Denies any chest pain, or discomfort with deep breaths and denies any palpitations.  He does have productive sputum as stated above as well as upper respiratory symptoms.  He has not had a flu shot yet this season  PMH: Diastolic heart failure, type 2 diabetes, GERD ROS: See history of present illness  Social history: Former smoker quit in 1993  Objective:  Physical Exam: BP (!) 143/95 (BP Location: Left Arm, Patient Position: Sitting, Cuff Size: Normal)   Pulse 84   Temp 98 F (36.7 C) (Oral)   Wt (!) 323 lb (146.5 kg)   BMI 50.59 kg/m   Gen: 60 year old male NAD, resting comfortably HEENT: No sinus tenderness, eyes injected bilaterally without drainage, nasal turbinates without edema, oropharynx clear CV: RRR with no murmurs appreciated Pulm: NWOB, faint bibasilar crackles, no wheezing wheezes, or rhonchi Ext: no clubbing, cyanosis or edema Skin: warm, dry  No results found for this or any previous visit (from the past 72 hour(s)).   Assessment/Plan:  Upper respiratory symptom One week of productive cough, dry and injected eyes, sore throat and generally feeling lousy.  No fevers or myalgias. No signs or symptoms of more serious pathology like pulmonary embolism.  Exam significant only for bibasilar crackles not noticed on previous exam on August 2017. Patient does  have a history of grade 1 diastolic dysfunction and is supposed to be on 40 mg Lasix daily.  This could explain the crackles, however he does not have any fluid overload in his legs.  Other possible causes of crackles could be bibasilar pneumonia, however patient would appear much sicker. Could also be a viral bronchitis. - Prescribed Mucinex DM  - Ordered two-view chest x-ray and recommended that he get this today and I will review it and prescribe him antibiotics if appropriate   Dyspnea Shortness of breath for the past week, associated with upper respiratory symptoms as described above. Does have a history of grade 1 diastolic dysfunction with recent EF of 55-60%.  Bibasilar crackles on exam. Unclear if this is due to his heart failure, or if he has a viral pneumonia, or bronchitis or possible he has a bibasilar bacterial pneumonia.  - Follow-up 2 view chest x-ray, pending on results may prescribe five-day course of azithromycin.  Ambulatory pulse ox was normal. - If chest x-ray negative recommended continuing 40 mg Lasix and Mucinex DM daily

## 2016-01-13 NOTE — Patient Instructions (Addendum)
Thatcher, you was seen today for 1 week of productive cough, runny nose and some shortness of breath.  On exam I heard some crackles in the bases of both of your lungs.  It's unclear to me right now if necrotic ulcer because of your heart failure and that you have not been taking your Lasix, or if you have a bronchitis or a possible pneumonia.  I have ordered a chest x-ray for you to get.  I will review these results, and if you have an infection I will prescribe an antibiotic and give you a call.  She did not have an infection, I would recommend continuing with Mucinex DM, taking your Lasix and following up next week if your symptoms are not resolved.   Nice seeing you, Cherly Anderson. Rosalyn Gess, [place] Resident PGY-1 01/13/2016 11:17 AM

## 2016-01-13 NOTE — Assessment & Plan Note (Addendum)
Shortness of breath for the past week, associated with upper respiratory symptoms as described above. Does have a history of grade 1 diastolic dysfunction with recent EF of 55-60%.  Bibasilar crackles on exam. Unclear if this is due to his heart failure, or if he has a viral pneumonia, or bronchitis or possible he has a bibasilar bacterial pneumonia.  - Follow-up 2 view chest x-ray, pending on results may prescribe five-day course of azithromycin.  Ambulatory pulse ox was normal. - If chest x-ray negative recommended continuing 40 mg Lasix and Mucinex DM daily

## 2016-01-20 ENCOUNTER — Ambulatory Visit (INDEPENDENT_AMBULATORY_CARE_PROVIDER_SITE_OTHER): Payer: PPO | Admitting: Family Medicine

## 2016-01-20 ENCOUNTER — Encounter: Payer: Self-pay | Admitting: Family Medicine

## 2016-01-20 DIAGNOSIS — B9789 Other viral agents as the cause of diseases classified elsewhere: Secondary | ICD-10-CM

## 2016-01-20 DIAGNOSIS — Z23 Encounter for immunization: Secondary | ICD-10-CM | POA: Diagnosis not present

## 2016-01-20 DIAGNOSIS — J069 Acute upper respiratory infection, unspecified: Secondary | ICD-10-CM

## 2016-01-20 NOTE — Patient Instructions (Addendum)
You were seen today for a cold follow up and you look much better today.  Your cough could continue for another 4 weeks or so, so don't be alarmed if this persists.  If you develop fevers or worsening symptoms I would come back to the office to be evaluated.  I'm glad you received your flu-shot today!  Take care, Olia Hinderliter L. Rosalyn Gess, [facility] Resident PGY-1 01/20/2016 3:22 PM

## 2016-01-20 NOTE — Assessment & Plan Note (Signed)
Patient with URI improving since last visit.  Does have lingering post-viral dry cough without fevers, LE swelling or pain and stable VS. - should be self-limiting  - return precautions discussed

## 2016-01-20 NOTE — Progress Notes (Signed)
    Subjective:  Alex [name] is a 60 y.o. male who presents to the [facility] today with a chief complaint of cough   HPI:  Upper respiratory illness: Seen last week by me for evaluation of cough, sore throat and an overall picture of upper respiratory viral infection. At that time had faint bibasilar crackles on exam without LE swelling. Given his history of G2DD and poor compliance with lasix, felt to be related to heart failure.  Had CXR to r/o PNA, which was negative.  Was asked to continue daily lasix and start mucinex DM.   Asked patient to follow up in one week if symptoms persisted or he felt worse.  Continues with dry cough without fevers, LE swelling, chest pain, SOB and stable VS.  Duration about 2 weeks now, in the setting of a recent URI.  Feels improved overall.   [place]: HTN, GERD, OSA, remote smoking history  ROS: see HPI  Objective:  Physical Exam: BP 131/87   Pulse 86   Temp 97.7 F (36.5 C) (Oral)   Ht 5\' 7"  (1.702 m)   Wt (!) 317 lb (143.8 kg)   SpO2 97%   BMI 49.65 kg/m   Gen: 60yo M in NAD, resting comfortably CV: RRR with no murmurs appreciated Pulm: NWOB, CTAB with bibasilar crackles and no wheezes, or rhonchi GI: Normal bowel sounds present. Soft, Nontender, Nondistended. MSK: no edema, cyanosis, or clubbing noted Skin: warm, dry Neuro: grossly normal, moves all extremities Psych: Normal affect and thought content  No results found for this or any previous visit (from the past 72 hour(s)).   Assessment/Plan:  Viral upper respiratory infection Patient with URI improving since last visit.  Does have lingering post-viral dry cough without fevers, LE swelling or pain and stable VS. - should be self-limiting  - return precautions discussed

## 2016-02-07 ENCOUNTER — Other Ambulatory Visit: Payer: Self-pay | Admitting: Family Medicine

## 2016-02-07 NOTE — Telephone Encounter (Signed)
No longer a [facility] patient  

## 2016-04-10 ENCOUNTER — Telehealth: Payer: Self-pay | Admitting: Family Medicine

## 2016-04-10 NOTE — Telephone Encounter (Signed)
Let me know if you would rather pt come in for a same day visit to be evaluated.

## 2016-04-10 NOTE — Telephone Encounter (Signed)
Spoke with pt- he is denying an office visit at this time. Pt states he is self medicating with OTC. Pt states his temp has improved with tylenol. Pt advised to come in for a visit if sxs do not improve.

## 2016-04-10 NOTE — Telephone Encounter (Signed)
Pt is calling because he has a cold with a low grade fever. He was wondering if the doctor can call in some medication for him. jw

## 2016-04-10 NOTE — Telephone Encounter (Signed)
Patient needs office visit.  [name]. Jerline Pain, [place] Resident PGY-3 04/10/2016 11:27 AM

## 2016-05-07 ENCOUNTER — Other Ambulatory Visit: Payer: Self-pay | Admitting: Family Medicine

## 2016-05-07 NOTE — Telephone Encounter (Signed)
Patient is aware of this. Alex [name],CMA  

## 2016-05-07 NOTE — Telephone Encounter (Signed)
Rx filled. Patient needs office appointment.   Alex [name]. Jerline Pain, [facility] Resident PGY-3 05/07/2016 10:08 AM

## 2016-05-16 ENCOUNTER — Other Ambulatory Visit: Payer: Self-pay | Admitting: *Deleted

## 2016-05-17 MED ORDER — ALLOPURINOL 100 MG PO TABS: 100.0000 mg | ORAL_TABLET | Freq: Every day | ORAL | 2 refills | Status: DC

## 2016-05-17 NOTE — Telephone Encounter (Signed)
Rx filled. Patient needs office visit.  Alex [name]. Jerline Pain, [place] Resident PGY-3 05/17/2016 9:19 AM

## 2016-05-18 NOTE — Telephone Encounter (Signed)
Pt has an apt March 12th.

## 2016-05-21 ENCOUNTER — Ambulatory Visit: Payer: PPO | Admitting: Family Medicine

## 2016-05-22 ENCOUNTER — Ambulatory Visit (INDEPENDENT_AMBULATORY_CARE_PROVIDER_SITE_OTHER): Payer: PPO | Admitting: Obstetrics and Gynecology

## 2016-05-22 VITALS — BP 128/78 | HR 80 | Temp 97.5°F | Wt 326.8 lb

## 2016-05-22 DIAGNOSIS — I10 Essential (primary) hypertension: Secondary | ICD-10-CM

## 2016-05-22 DIAGNOSIS — IMO0001 Reserved for inherently not codable concepts without codable children: Secondary | ICD-10-CM

## 2016-05-22 DIAGNOSIS — B351 Tinea unguium: Secondary | ICD-10-CM | POA: Diagnosis not present

## 2016-05-22 DIAGNOSIS — E119 Type 2 diabetes mellitus without complications: Secondary | ICD-10-CM

## 2016-05-22 DIAGNOSIS — Z6841 Body Mass Index (BMI) 40.0 and over, adult: Secondary | ICD-10-CM

## 2016-05-22 DIAGNOSIS — E6609 Other obesity due to excess calories: Secondary | ICD-10-CM

## 2016-05-22 DIAGNOSIS — L600 Ingrowing nail: Secondary | ICD-10-CM | POA: Diagnosis not present

## 2016-05-22 LAB — POCT GLYCOSYLATED HEMOGLOBIN (HGB A1C): Hemoglobin A1C: 6.3

## 2016-05-22 NOTE — Assessment & Plan Note (Signed)
Well-controlled. Continue therapies. No refills needed at this time. Follow-up with PCP.

## 2016-05-22 NOTE — Patient Instructions (Signed)
Referral for podiatry made  Ingrown Toenail An ingrown toenail occurs when the corner or sides of your toenail grow into the surrounding skin. The big toe is most commonly affected, but it can happen to any of your toes. If your ingrown toenail is not treated, you will be at risk for infection. What are the causes? This condition may be caused by:  Wearing shoes that are too small or tight.  Injury or trauma, such as stubbing your toe or having your toe stepped on.  Improper cutting or care of your toenails.  Being born with (congenital) nail or foot abnormalities, such as having a nail that is too big for your toe. What increases the risk? Risk factors for an ingrown toenail include:  Age. Your nails tend to thicken as you get older, so ingrown nails are more common in older people.  Diabetes.  Cutting your toenails incorrectly.  Blood circulation problems. What are the signs or symptoms? Symptoms may include:  Pain, soreness, or tenderness.  Redness.  Swelling.  Hardening of the skin surrounding the toe. Your ingrown toenail may be infected if there is fluid, pus, or drainage. How is this diagnosed? An ingrown toenail may be diagnosed by medical history and physical exam. If your toenail is infected, your health care provider may test a sample of the drainage. How is this treated? Treatment depends on the severity of your ingrown toenail. Some ingrown toenails may be treated at home. More severe or infected ingrown toenails may require surgery to remove all or part of the nail. Infected ingrown toenails may also be treated with antibiotic medicines. Follow these instructions at home:  If you were prescribed an antibiotic medicine, finish all of it even if you start to feel better.  Soak your foot in warm soapy water for 20 minutes, 3 times per day or as directed by your health care provider.  Carefully lift the edge of the nail away from the sore skin by wedging a small  piece of cotton under the corner of the nail. This may help with the pain. Be careful not to cause more injury to the area.  Wear shoes that fit well. If your ingrown toenail is causing you pain, try wearing sandals, if possible.  Trim your toenails regularly and carefully. Do not cut them in a curved shape. Cut your toenails straight across. This prevents injury to the skin at the corners of the toenail.  Keep your feet clean and dry.  If you are having trouble walking and are given crutches by your health care provider, use them as directed.  Do not pick at your toenail or try to remove it yourself.  Take medicines only as directed by your health care provider.  Keep all follow-up visits as directed by your health care provider. This is important. Contact a health care provider if:  Your symptoms do not improve with treatment. Get help right away if:  You have red streaks that start at your foot and go up your leg.  You have a fever.  You have increased redness, swelling, or pain.  You have fluid, blood, or pus coming from your toenail. This information is not intended to replace advice given to you by your health care provider. Make sure you discuss any questions you have with your health care provider. Document Released: 02/24/2000 Document Revised: 07/29/2015 Document Reviewed: 01/20/2014 Elsevier Interactive Patient Education  2017 [employer].

## 2016-05-22 NOTE — Assessment & Plan Note (Signed)
Diet controlled. A1c 6.3 today. Has remained stable. Patient not on any therapies. Has deferred metformin. Will leave further discussions to PCP. Encouraged lifestyle modifications including increasing activity and improving diet. Follow-up with PCP in 3-6 months.

## 2016-05-22 NOTE — Assessment & Plan Note (Signed)
Left great toenail with onychomycosis and ingrown toenail. No signs of infection around skin breakdown. Referral placed to podiatry.

## 2016-05-22 NOTE — Assessment & Plan Note (Signed)
>>  ASSESSMENT AND PLAN FOR DIABETES TYPE 2, CONTROLLED WRITTEN ON 05/22/2016  1:57 PM BY PHELPS, JAZMA Y, DO  Diet controlled. A1c 6.3 today. Has remained stable. Patient not on any therapies. Has deferred metformin. Will leave further discussions to PCP. Encouraged lifestyle modifications including increasing activity and improving diet. Follow-up with PCP in 3-6 months.

## 2016-05-22 NOTE — Assessment & Plan Note (Signed)
Patient remains obese with a BMI 51. Would like to lose weight but readiness to commit to change is not there. Encouraged patient to increase activity and decrease caloric intake. Will likely benefit from nutrition referral in future. PCP to follow-up.

## 2016-05-22 NOTE — Progress Notes (Signed)
     Subjective: Chief Complaint  Patient presents with  . Follow-up    BP     HPI: Alex [name] is a 61 y.o. presenting to clinic today to discuss the following:  #Hypertension Blood pressure at home: monitors Blood pressure today: wnl Taking Meds: yes; needed refill but PCP already refilled prior to appointment Side effects: none ROS: Denies headache, dizziness, visual changes, nausea, vomiting, chest pain, abdominal pain or shortness of breath.  #Ingrown toenail Location: Left great toe Patient tried removing ingrown toenail himself States that he has issues with ingrown toenails twice a year This time he thinks he may have dug into toenail too much States that there is also toenail material growing from the lateral skin wall of his toe Denies any warmth or erythema or discharge from toe No pain or fevers  #Obesity Patient wants to lose weight States that he overeats and is inactive States that he cooks most of his meals Has Silver sneakers but does not go to the gym  #DM Last A1c 6.5 - 07/2015 Not currently on any therapies    ROS noted in HPI.   Past Medical, Surgical, Social, and Family History Reviewed & Updated per EMR.    History  Smoking Status  . Former Smoker  . Packs/day: 1.50  . Years: 10.00  . Types: Cigarettes  . Quit date: 11/06/1991  Smokeless Tobacco  . Never Used    Comment: quit 1993    Objective: BP 128/78   Pulse 80   Temp 97.5 F (36.4 C) (Oral)   Wt (!) 326 lb 12.8 oz (148.2 kg)   BMI 51.18 kg/m  Vitals and nursing notes reviewed  Physical Exam Gen: NAD, resting comfortably, obese male Neck: FROM in all directions. Nontender to palpation. No bony tenderness. CV: RRR with no murmurs appreciated Pulm: NWOB, CTAB with no crackles, wheezes, or rhonchi GI: Normal bowel sounds present. Soft, Nontender, Nondistended. Extremities: left great toe with thick toenail. Nail cut on lateral edge to prevent ingorwn toenail. Has area of  skin  Skin: warm, dry Neuro: grossly normal, moves all extremities Psych: Normal affect and thought content  Results for orders placed or performed in visit on 05/22/16 (from the past 72 hour(s))  HgB A1c     Status: None   Collection Time: 05/22/16 10:30 AM  Result Value Ref Range   Hemoglobin A1C 6.3     Assessment/Plan: Please see problem based Assessment and Plan PATIENT EDUCATION PROVIDED: See AVS    Diagnosis and plan were discussed in detail with this patient today. The patient verbalized understanding and agreed with the plan.    Orders Placed This Encounter  Procedures  . Ambulatory referral to Podiatry    Referral Priority:   Routine    Referral Type:   Consultation    Referral Reason:   Specialty Services Required    Requested Specialty:   Podiatry    Number of Visits Requested:   1  . HgB A1c     Luiz Blare, DO 05/22/2016, 10:35 AM PGY-3, [place]

## 2016-05-25 ENCOUNTER — Emergency Department (HOSPITAL_COMMUNITY): Payer: PPO

## 2016-05-25 ENCOUNTER — Emergency Department (HOSPITAL_COMMUNITY)
Admission: EM | Admit: 2016-05-25 | Discharge: 2016-05-25 | Disposition: A | Discharge status: Home / Self Care | Payer: PPO | Attending: Emergency Medicine | Admitting: Emergency Medicine

## 2016-05-25 ENCOUNTER — Encounter (HOSPITAL_COMMUNITY): Payer: Self-pay | Admitting: Physician Assistant

## 2016-05-25 DIAGNOSIS — Z79899 Other long term (current) drug therapy: Secondary | ICD-10-CM | POA: Insufficient documentation

## 2016-05-25 DIAGNOSIS — E119 Type 2 diabetes mellitus without complications: Secondary | ICD-10-CM | POA: Diagnosis not present

## 2016-05-25 DIAGNOSIS — Y939 Activity, unspecified: Secondary | ICD-10-CM | POA: Insufficient documentation

## 2016-05-25 DIAGNOSIS — I2510 Atherosclerotic heart disease of native coronary artery without angina pectoris: Secondary | ICD-10-CM | POA: Diagnosis not present

## 2016-05-25 DIAGNOSIS — I5030 Unspecified diastolic (congestive) heart failure: Secondary | ICD-10-CM | POA: Insufficient documentation

## 2016-05-25 DIAGNOSIS — Y92410 Unspecified street and highway as the place of occurrence of the external cause: Secondary | ICD-10-CM | POA: Insufficient documentation

## 2016-05-25 DIAGNOSIS — Z7982 Long term (current) use of aspirin: Secondary | ICD-10-CM | POA: Insufficient documentation

## 2016-05-25 DIAGNOSIS — Z87891 Personal history of nicotine dependence: Secondary | ICD-10-CM | POA: Insufficient documentation

## 2016-05-25 DIAGNOSIS — Z955 Presence of coronary angioplasty implant and graft: Secondary | ICD-10-CM | POA: Insufficient documentation

## 2016-05-25 DIAGNOSIS — J45909 Unspecified asthma, uncomplicated: Secondary | ICD-10-CM | POA: Diagnosis not present

## 2016-05-25 DIAGNOSIS — R457 State of emotional shock and stress, unspecified: Secondary | ICD-10-CM | POA: Insufficient documentation

## 2016-05-25 DIAGNOSIS — Z8673 Personal history of transient ischemic attack (TIA), and cerebral infarction without residual deficits: Secondary | ICD-10-CM | POA: Insufficient documentation

## 2016-05-25 DIAGNOSIS — I110 Hypertensive heart disease with heart failure: Secondary | ICD-10-CM | POA: Diagnosis not present

## 2016-05-25 DIAGNOSIS — I252 Old myocardial infarction: Secondary | ICD-10-CM | POA: Diagnosis not present

## 2016-05-25 DIAGNOSIS — F439 Reaction to severe stress, unspecified: Secondary | ICD-10-CM | POA: Diagnosis not present

## 2016-05-25 DIAGNOSIS — Y999 Unspecified external cause status: Secondary | ICD-10-CM | POA: Diagnosis not present

## 2016-05-25 DIAGNOSIS — S161XXA Strain of muscle, fascia and tendon at neck level, initial encounter: Secondary | ICD-10-CM | POA: Insufficient documentation

## 2016-05-25 DIAGNOSIS — S199XXA Unspecified injury of neck, initial encounter: Secondary | ICD-10-CM | POA: Diagnosis not present

## 2016-05-25 DIAGNOSIS — M542 Cervicalgia: Secondary | ICD-10-CM | POA: Diagnosis not present

## 2016-05-25 MED ORDER — NAPROXEN 500 MG PO TABS
500.0000 mg | ORAL_TABLET | Freq: Once | ORAL | Status: AC
  Administered 2016-05-25: 500 mg via ORAL
  Filled 2016-05-25: qty 1
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
## 2016-05-30 ENCOUNTER — Ambulatory Visit: Payer: PPO | Admitting: Podiatry

## 2016-05-31 ENCOUNTER — Encounter: Payer: Self-pay | Admitting: Obstetrics and Gynecology

## 2016-05-31 ENCOUNTER — Ambulatory Visit (INDEPENDENT_AMBULATORY_CARE_PROVIDER_SITE_OTHER): Payer: PPO | Admitting: Obstetrics and Gynecology

## 2016-05-31 VITALS — BP 118/70 | HR 88 | Temp 98.5°F | Ht 67.0 in | Wt 329.0 lb

## 2016-05-31 DIAGNOSIS — S161XXD Strain of muscle, fascia and tendon at neck level, subsequent encounter: Secondary | ICD-10-CM

## 2016-05-31 MED ORDER — BACLOFEN 10 MG PO TABS: 10.0000 mg | ORAL_TABLET | Freq: Three times a day (TID) | ORAL | 0 refills | Status: DC
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
## 2016-05-31 NOTE — Patient Instructions (Signed)
Cervical Sprain A cervical sprain is a stretch or tear in the tissues that connect bones (ligaments) in the neck. Most neck (cervical) sprains get better in 4-6 weeks. Follow these instructions at home: If you have a neck collar:  Wear it as told by your doctor. Do not take off (do not remove) the collar unless your doctor says that this is safe.  Ask your doctor before adjusting your collar.  If you have long hair, keep it outside of the collar.  Ask your doctor if you may take off the collar for cleaning and bathing. If you may take off the collar:  Follow instructions from your doctor about how to take off the collar safely.  Clean the collar by wiping it with mild soap and water. Let it air-dry all the way.  If your collar has removable pads:  Take the pads out every 1-2 days.  Hand wash the pads with soap and water.  Let the pads air-dry all the way before you put them back in the collar. Do not dry them in a clothes dryer. Do not dry them with a hair dryer.  Check your skin under the collar for irritation or sores. If you see any, tell your doctor. Managing pain, stiffness, and swelling  Use a cervical traction device, if told by your doctor.  If told, put heat on the affected area. Do this before exercises (physical therapy) or as often as told by your doctor. Use the heat source that your doctor recommends, such as a moist heat pack or a heating pad.  Place a towel between your skin and the heat source.  Leave the heat on for 20-30 minutes.  Take the heat off (remove the heat) if your skin turns bright red. This is very important if you cannot feel pain, heat, or cold. You may have a greater risk of getting burned.  Put ice on the affected area.  Put ice in a plastic bag.  Place a towel between your skin and the bag.  Leave the ice on for 20 minutes, 2-3 times a day. Activity  Do not drive while wearing a neck collar. If you do not have a neck collar, ask your  doctor if it is safe to drive.  Do not drive or use heavy machinery while taking prescription pain medicine or muscle relaxants, unless your doctor approves.  Do not lift anything that is heavier than 10 lb (4.5 kg) until your doctor tells you that it is safe.  Rest as told by your doctor.  Avoid activities that make you feel worse. Ask your doctor what activities are safe for you.  Do exercises as told by your doctor or physical therapist. Preventing neck sprain  Practice good posture. Adjust your workstation to help with this, if needed.  Exercise regularly as told by your doctor or physical therapist.  Avoid activities that are risky or may cause a neck sprain (cervical sprain). General instructions  Take over-the-counter and prescription medicines only as told by your doctor.  Do not use any products that contain nicotine or tobacco. This includes cigarettes and e-cigarettes. If you need help quitting, ask your doctor.  Keep all follow-up visits as told by your doctor. This is important. Contact a doctor if:  You have pain or other symptoms that get worse.  You have symptoms that do not get better after 2 weeks.  You have pain that does not get better with medicine.  You start to have new,   unexplained symptoms.  You have sores or irritated skin from wearing your neck collar. Get help right away if:  You have very bad pain.  You have any of the following in any part of your body:  Loss of feeling (numbness).  Tingling.  Weakness.  You cannot move a part of your body (you have paralysis).  Your activity level does not improve. Summary  A cervical sprain is a stretch or tear in the tissues that connect bones (ligaments) in the neck.  If you have a neck (cervical) collar, do not take off the collar unless your doctor says that this is safe.  Put ice on affected areas as told by your doctor.  Put heat on affected areas as told by your doctor.  Good posture  and regular exercise can help prevent a neck sprain from happening again. This information is not intended to replace advice given to you by your health care provider. Make sure you discuss any questions you have with your health care provider. Document Released: 08/15/2007 Document Revised: 11/08/2015 Document Reviewed: 11/08/2015 Elsevier Interactive Patient Education  2017 [employer]vier Inc.  

## 2016-06-11 ENCOUNTER — Ambulatory Visit (INDEPENDENT_AMBULATORY_CARE_PROVIDER_SITE_OTHER): Payer: PPO | Admitting: Student

## 2016-06-11 DIAGNOSIS — R21 Rash and other nonspecific skin eruption: Secondary | ICD-10-CM | POA: Diagnosis not present

## 2016-06-11 MED ORDER — TRIAMCINOLONE ACETONIDE 0.5 % EX OINT: TOPICAL_OINTMENT | CUTANEOUS | 0 refills | Status: DC

## 2016-06-11 NOTE — Progress Notes (Addendum)
   Subjective:    Patient ID: Alex [name], male    DOB: 1955-04-23, 61 y.o.   MRN: [id number]   CC: rash on right hand  HPI: 61 y/o M presents for rash on right hand  Rash on right hand - started yesterday while playing the piano at church - noted redness on his right ring and middle fingers - he did not see any bugs so he is not sure if he has an insect bite - he denies spending significant amounts of time outside - no pain or reduced finger range of motion  Smoking status reviewed  Review of Systems  Per HPI, else denies recent illness, fever,  chest pain, shortness of breath,  Objective:  BP 126/90   Pulse 82   Temp 98.6 F (37 C) (Oral)   Wt (!) 323 lb (146.5 kg)   SpO2 92%   BMI 50.59 kg/m  Vitals and nursing note reviewed  General: NAD Cardiac: RRR Respiratory: CTAB, normal effort. Extremities: Normal ROM of all digits of hands Skin: approximately 1 cm diameter circumference erythematous lesions on ring finger and middle finger of right hand, no vesicular lesions or tracking lesions, no swelling Neuro: alert and oriented, no focal deficits   Assessment & Plan:    Rash and nonspecific skin eruption Rash on right hand more consistent with insect bites - will prescribe steroid cream to help with itching - follow up as needed    Elisheva Fallas A. Lincoln Brigham MD, [facility] Resident PGY-3 Pager [phone]

## 2016-06-11 NOTE — Patient Instructions (Signed)
Follow up as needed Take triamcinolone cream as prescribed for rash If you have questions or concerns, call the office at [phone]

## 2016-06-11 NOTE — Assessment & Plan Note (Signed)
Rash on right hand more consistent with insect bites - will prescribe steroid cream to help with itching - follow up as needed

## 2016-06-14 ENCOUNTER — Ambulatory Visit: Payer: PPO | Admitting: Podiatry

## 2016-07-10 ENCOUNTER — Other Ambulatory Visit: Payer: Self-pay | Admitting: Family Medicine

## 2016-08-03 ENCOUNTER — Other Ambulatory Visit: Payer: Self-pay | Admitting: Family Medicine

## 2016-08-03 ENCOUNTER — Telehealth: Payer: Self-pay | Admitting: Family Medicine

## 2016-08-03 NOTE — Telephone Encounter (Signed)
Clinical info completed on handicap placard form.  Place form in Dr. Marigene Ehlers box for completion.  Alex [name], [place]

## 2016-08-03 NOTE — Telephone Encounter (Signed)
Pt form dropped off for at front desk for completion.  Verified that patient section of form has been completed.  Last DOS/WCC with PCP was 07-01-16.  Placed form in blue team folder to be completed by clinical staff.  Alex [name]

## 2016-08-07 NOTE — Telephone Encounter (Signed)
Form completed and given to [place].  [name]. Jerline Pain, [facility] Resident PGY-3 08/07/2016 9:08 AM

## 2016-08-07 NOTE — Telephone Encounter (Signed)
Patient is aware that placard form is complete and ready for pickup.  Derl Barrow, RN

## 2016-08-14 ENCOUNTER — Telehealth: Payer: Self-pay | Admitting: Family Medicine

## 2016-08-14 ENCOUNTER — Other Ambulatory Visit (HOSPITAL_COMMUNITY)
Admission: RE | Admit: 2016-08-14 | Discharge: 2016-08-14 | Disposition: A | Discharge status: Home / Self Care | Payer: PPO | Source: Ambulatory Visit | Attending: Family Medicine | Admitting: Family Medicine

## 2016-08-14 ENCOUNTER — Encounter: Payer: Self-pay | Admitting: Family Medicine

## 2016-08-14 ENCOUNTER — Ambulatory Visit (INDEPENDENT_AMBULATORY_CARE_PROVIDER_SITE_OTHER): Payer: PPO | Admitting: Family Medicine

## 2016-08-14 VITALS — BP 118/80 | HR 90 | Temp 97.9°F | Ht 67.0 in | Wt 323.0 lb

## 2016-08-14 DIAGNOSIS — Z113 Encounter for screening for infections with a predominantly sexual mode of transmission: Secondary | ICD-10-CM | POA: Insufficient documentation

## 2016-08-14 NOTE — Telephone Encounter (Signed)
opened in error

## 2016-08-14 NOTE — Assessment & Plan Note (Addendum)
No known previous exposure. MSW only with 12 lifetime partners. Last recent sexual intercourse 2 months ago. Asymptomatic without complaints. Unremarkable genital exam. Does not use protection. --Will obtain RPR, HIV, GC/chlamydia, acute hepatitis panel --Discussed importance of using condoms to prevent STDs and other safe sex practice

## 2016-08-14 NOTE — Patient Instructions (Addendum)
Thank you for coming in to see [place] today. Please see below to review our plan for today's visit.  1. I will call you if your lab results are abnormal. I will send the results by mail if they are normal. 2. It is important to use condoms or some barrier during intercourse to prevent STDs.  Please call the clinic at [phone] if your symptoms worsen or you have any concerns. It was my pleasure to see you. -- Harriet Butte, [place], PGY-1    Sexually Transmitted Disease A sexually transmitted disease (STD) is a disease or infection that may be passed (transmitted) from person to person, usually during sexual activity. This may happen by way of saliva, semen, blood, vaginal mucus, or urine. Common STDs include:  Gonorrhea.  Chlamydia.  Syphilis.  HIV and AIDS.  Genital herpes.  Hepatitis B and C.  Trichomonas.  Human papillomavirus (HPV).  Pubic lice.  Scabies.  Mites.  Bacterial vaginosis.  What are the causes? An STD may be caused by bacteria, a virus, or parasites. STDs are often transmitted during sexual activity if one person is infected. However, they may also be transmitted through nonsexual means. STDs may be transmitted after:  Sexual intercourse with an infected person.  Sharing sex toys with an infected person.  Sharing needles with an infected person or using unclean piercing or tattoo needles.  Having intimate contact with the genitals, mouth, or rectal areas of an infected person.  Exposure to infected fluids during birth.  What are the signs or symptoms? Different STDs have different symptoms. Some people may not have any symptoms. If symptoms are present, they may include:  Painful or bloody urination.  Pain in the pelvis, abdomen, vagina, anus, throat, or eyes.  A skin rash, itching, or irritation.  Growths, ulcerations, blisters, or sores in the genital and anal areas.  Abnormal vaginal discharge with or without  bad odor.  Penile discharge in men.  Fever.  Pain or bleeding during sexual intercourse.  Swollen glands in the groin area.  Yellow skin and eyes (jaundice). This is seen with hepatitis.  Swollen testicles.  Infertility.  Sores and blisters in the mouth.  How is this diagnosed? To make a diagnosis, your health care provider may:  Take a medical history.  Perform a physical exam.  Take a sample of any discharge to examine.  Swab the throat, cervix, opening to the penis, rectum, or vagina for testing.  Test a sample of your first morning urine.  Perform blood tests.  Perform a Pap test, if this applies.  Perform a colposcopy.  Perform a laparoscopy.  How is this treated? Treatment depends on the STD. Some STDs may be treated but not cured.  Chlamydia, gonorrhea, trichomonas, and syphilis can be cured with antibiotic medicine.  Genital herpes, hepatitis, and HIV can be treated, but not cured, with prescribed medicines. The medicines lessen symptoms.  Genital warts from HPV can be treated with medicine or by freezing, burning (electrocautery), or surgery. Warts may come back.  HPV cannot be cured with medicine or surgery. However, abnormal areas may be removed from the cervix, vagina, or vulva.  If your diagnosis is confirmed, your recent sexual partners need treatment. This is true even if they are symptom-free or have a negative culture or evaluation. They should not have sex until their health care providers say it is okay.  Your health care provider may test you for infection again 3 months after treatment.  How is this prevented? Take these steps to reduce your risk of getting an STD:  Use latex condoms, dental dams, and water-soluble lubricants during sexual activity. Do not use petroleum jelly or oils.  Avoid having multiple sex partners.  Do not have sex with someone who has other sex partners.  Do not have sex with anyone you do not know or who is  at high risk for an STD.  Avoid risky sex practices that can break your skin.  Do not have sex if you have open sores on your mouth or skin.  Avoid drinking too much alcohol or taking illegal drugs. Alcohol and drugs can affect your judgment and put you in a vulnerable position.  Avoid engaging in oral and anal sex acts.  Get vaccinated for HPV and hepatitis. If you have not received these vaccines in the past, talk to your health care provider about whether one or both might be right for you.  If you are at risk of being infected with HIV, it is recommended that you take a prescription medicine daily to prevent HIV infection. This is called pre-exposure prophylaxis (PrEP). You are considered at risk if: ? You are a man who has sex with other men (MSM). ? You are a heterosexual man or woman and are sexually active with more than one partner. ? You take drugs by injection. ? You are sexually active with a partner who has HIV.  Talk with your health care provider about whether you are at high risk of being infected with HIV. If you choose to begin PrEP, you should first be tested for HIV. You should then be tested every 3 months for as long as you are taking PrEP.  Contact a health care provider if:  See your health care provider.  Tell your sexual partner(s). They should be tested and treated for any STDs.  Do not have sex until your health care provider says it is okay. Get help right away if: Contact your health care provider right away if:  You have severe abdominal pain.  You are a man and notice swelling or pain in your testicles.  You are a woman and notice swelling or pain in your vagina.  This information is not intended to replace advice given to you by your health care provider. Make sure you discuss any questions you have with your health care provider. Document Released: 05/19/2002 Document Revised: 09/16/2015 Document Reviewed: 09/16/2012 Elsevier Interactive Patient  Education  2018 [employer].

## 2016-08-14 NOTE — Progress Notes (Signed)
   Subjective:   Patient ID: Alex [name]    DOB: March 30, 1955, 61 y.o. male   MRN: [id number]  CC: "STD check"  HPI: Alex [name] is a 61 y.o. male who presents to clinic today for STD check. Problems discussed today are as follows:  STD screening: Patient is here requesting STD screening. Denies any exposure or risk exposure. Asymptomatic without complaints. Denies having been previously tested for STDs. Has history of MSW only with 12 lifetime partners. Last sexual intercourse 2 months ago. Patient states he wants to know "if he has anything before having intercourse with his new partner." ROS: Denies fevers or chills, penile discharge or erythema, testicular pain or swelling, dysuria or hematuria.  Complete ROS performed, see HPI for pertinent.  [place]: HTN, HFpEF, CAD, OSA, GERD, esophogeal structures, NIDDM, gout, obesity, h/o TIA, h/o EGD, nasal sinus surgery, vasectomy. Mother had h/o MI and HF. Smoking status reviewed. Medications reviewed.  Objective:   BP 118/80 (BP Location: Right Arm, Patient Position: Sitting, Cuff Size: Large)   Pulse 90   Temp 97.9 F (36.6 C) (Oral)   Ht 5\' 7"  (1.702 m)   Wt (!) 323 lb (146.5 kg)   SpO2 98%   BMI 50.59 kg/m  Vitals and nursing note reviewed.  General: well nourished, well developed, in no acute distress with non-toxic appearance HEENT: normocephalic, atraumatic, moist mucous membranes CV: regular rate and rhythm without murmurs, rubs, or gallops, no lower extremity edema Lungs: clear to auscultation bilaterally with normal work of breathing Abdomen: soft, non-tender, normoactive bowel sounds GU: no scrotal edema or erythema, penile shaft and glans unremarkable, uncircumcised, foreskin easily retracted, no discharge at meatus, non tender on palpation Skin: warm, dry, no rashes or lesions, cap refill < 2 seconds Extremities: warm and well perfused, normal tone  Assessment & Plan:   Screen for STD (sexually transmitted  disease) No known previous exposure. MSW only with 12 lifetime partners. Last recent sexual intercourse 2 months ago. Asymptomatic without complaints. Unremarkable genital exam. Does not use protection. --Will obtain RPR, HIV, GC/chlamydia, acute hepatitis panel --Discussed importance of using condoms to prevent STDs and other safe sex practice  Orders Placed This Encounter  Procedures  . RPR  . HIV antibody  . Acute Hep Panel & Hep B Surface Ab   No orders of the defined types were placed in this encounter.   Harriet Butte, [place], PGY-1 08/14/2016 11:32 AM

## 2016-08-15 LAB — RPR: RPR Ser Ql: NONREACTIVE

## 2016-08-15 LAB — ACUTE HEP PANEL AND HEP B SURFACE AB
Hep A IgM: NEGATIVE
Hep B C IgM: NEGATIVE
Hep C Virus Ab: <0.1 s/co ratio (ref 0.0–0.9)
Hepatitis B Surf Ab Quant: 3.6 m[IU]/mL — ABNORMAL LOW (ref >9.9)
Hepatitis B Surface Ag: NEGATIVE

## 2016-08-15 LAB — HIV ANTIBODY (ROUTINE TESTING W REFLEX): HIV Screen 4th Generation wRfx: NONREACTIVE

## 2016-08-16 LAB — URINE CYTOLOGY ANCILLARY ONLY
Chlamydia: NEGATIVE
Neisseria Gonorrhea: NEGATIVE

## 2016-08-17 ENCOUNTER — Telehealth: Payer: Self-pay | Admitting: Family Medicine

## 2016-08-17 ENCOUNTER — Ambulatory Visit (INDEPENDENT_AMBULATORY_CARE_PROVIDER_SITE_OTHER): Payer: PPO | Admitting: *Deleted

## 2016-08-17 DIAGNOSIS — Z23 Encounter for immunization: Secondary | ICD-10-CM

## 2016-08-17 DIAGNOSIS — H40033 Anatomical narrow angle, bilateral: Secondary | ICD-10-CM | POA: Diagnosis not present

## 2016-08-17 DIAGNOSIS — E119 Type 2 diabetes mellitus without complications: Secondary | ICD-10-CM | POA: Diagnosis not present

## 2016-08-17 NOTE — Telephone Encounter (Signed)
Called patient regarding recent clinic visit for STD check. Not immunized for hepatitis B. Verbalized to patient the need to come into the clinic to receive his 3 shot series. Patient understands without further questions. -- Harriet Butte, [place], PGY-1

## 2016-08-17 NOTE — Progress Notes (Signed)
   Patient presents for first Hep B vaccine States feeling well today Given left deltoid; patient tolerated well Patient aware to schedule for second dose around 10/17/2016 and third dose around 02/16/2017. Reminder card given. Hubbard Hartshorn, RN, BSN

## 2016-10-17 ENCOUNTER — Ambulatory Visit (INDEPENDENT_AMBULATORY_CARE_PROVIDER_SITE_OTHER): Payer: PPO | Admitting: *Deleted

## 2016-10-17 DIAGNOSIS — Z23 Encounter for immunization: Secondary | ICD-10-CM | POA: Diagnosis not present

## 2016-10-17 NOTE — Progress Notes (Signed)
   Clarnce Flock presents for immunizations.    Screening questions for immunizations: 1. Are you sick today?  no 2. Do you have allergies to medications, foods, or any vaccines?  no 3. Have you ever had a serious reaction after receiving a vaccination?  no 4. Do you have a long-term health problem with heart disease, asthma, lung disease, kidney disease, metabolic disease (e.g. diabetes), anemia, or other blood disorder?  no 5. Have you had a seizure, brain problem, or other nervous system problem?  no 6. Do you have cancer, leukemia, AIDS, or any other immune system problem?  no 7. Do you take cortisone, prednisone, other steroids, anticancer drugs or have you had radiation treatments?  no 8. Have you received a transfusion of blood or blood products, or been given immune (gamma) globulin or an antiviral drug in the past year?  no 9. Have you received vaccinations in the past 4 weeks?  no 10. FEMALES ONLY: Are you pregnant or is there a chance you could become pregnant during the next month?  no   Derl Barrow, RN

## 2016-10-30 ENCOUNTER — Other Ambulatory Visit: Payer: Self-pay | Admitting: Family Medicine

## 2016-10-31 ENCOUNTER — Other Ambulatory Visit: Payer: Self-pay | Admitting: Family Medicine

## 2016-11-01 ENCOUNTER — Ambulatory Visit (INDEPENDENT_AMBULATORY_CARE_PROVIDER_SITE_OTHER): Payer: PPO | Admitting: Internal Medicine

## 2016-11-01 VITALS — BP 140/98 | HR 72 | Temp 98.5°F | Ht 67.0 in

## 2016-11-01 DIAGNOSIS — L600 Ingrowing nail: Secondary | ICD-10-CM | POA: Diagnosis not present

## 2016-11-01 DIAGNOSIS — E119 Type 2 diabetes mellitus without complications: Secondary | ICD-10-CM

## 2016-11-01 LAB — POCT GLYCOSYLATED HEMOGLOBIN (HGB A1C): Hemoglobin A1C: 6.4

## 2016-11-01 MED ORDER — NAPROXEN 250 MG PO TABS: 500.0000 mg | ORAL_TABLET | Freq: Two times a day (BID) | ORAL | 0 refills | Status: DC

## 2016-11-01 MED ORDER — DOXYCYCLINE HYCLATE 100 MG PO TABS: 100.0000 mg | ORAL_TABLET | Freq: Two times a day (BID) | ORAL | 0 refills | Status: DC

## 2016-11-01 NOTE — Patient Instructions (Addendum)
It was so nice to meet you!  I have prescribed Doxycycline, which is an antibiotic. Please take this twice a day.  We have scheduled you to see Dr. Andy Gauss on Tuesday, to have your toenail removed and to discuss your other concerns. Please be here at 8:30am.  -Dr. Brett Albino

## 2016-11-01 NOTE — Progress Notes (Signed)
   [facility] Phone: [phone]  Subjective:  Alex [name] is a 61 year old male presenting to clinic with left great toe pain for the last month. He states he has a history of ingrown toenails and he thinks this is the problem again. He has pain on the inside of his left great toe. The skin at the edge of the nail is red, swollen, and very tender. He has noticed some thick yellow drainage from the site. He has also had a small amount of bleeding. The toe is "throbbing". The pain is worse with wearing shoes. No fevers, no chills.  ROS: See HPI for pertinent positives and negatives  Past Medical History- HTN, diastolic heart failure, CAD, OSA, T2DM, OSA, obesity, hx of TIA, HLD  Family history reviewed for today's visit. No changes.  Social history- patient is a former smoker, quit in 1993  Objective: BP (!) 140/98 (BP Location: Left Arm, Patient Position: Sitting, Cuff Size: Large)   Pulse 72   Temp 98.5 F (36.9 C) (Oral)   Ht 5\' 7"  (1.702 m)  Gen: NAD, alert, cooperative with exam Left great toe: Nail is deformed and severely curved. Skin at the medial edge of the nail is erythematous, swollen, and exquisitely tender to palpation. Small to moderate amount of yellow drainage is able to be expressed from the area.   Assessment/Plan: Ingrown toenail of left great toe with infection: Left great toenail is very deformed and curved. Also with infection at the medial edge of the nail. Think he would benefit from having the toenail completely removed. - Appointment scheduled with PCP on 8/28 for toenail removal - Prescribed Doxycycline 100mg  bid x 10 days - Advised patient to soak the toe in warm water as much as possible  Hyman Bible, MD PGY-3

## 2016-11-02 DIAGNOSIS — L600 Ingrowing nail: Secondary | ICD-10-CM | POA: Insufficient documentation

## 2016-11-02 NOTE — Assessment & Plan Note (Signed)
Left great toenail is very deformed and curved. Also with infection at the medial edge of the nail. Think he would benefit from having the toenail completely removed. - Appointment scheduled with PCP on 8/28 for toenail removal - Prescribed Doxycycline 100mg  bid x 10 days - Advised patient to soak the toe in warm water as much as possible

## 2016-11-06 ENCOUNTER — Ambulatory Visit: Payer: PPO | Admitting: Family Medicine

## 2016-11-06 ENCOUNTER — Ambulatory Visit (INDEPENDENT_AMBULATORY_CARE_PROVIDER_SITE_OTHER): Payer: PPO | Admitting: Family Medicine

## 2016-11-06 ENCOUNTER — Encounter: Payer: Self-pay | Admitting: Family Medicine

## 2016-11-06 VITALS — BP 148/90 | HR 77 | Temp 97.9°F | Wt 327.0 lb

## 2016-11-06 DIAGNOSIS — E119 Type 2 diabetes mellitus without complications: Secondary | ICD-10-CM | POA: Diagnosis not present

## 2016-11-06 DIAGNOSIS — R3989 Other symptoms and signs involving the genitourinary system: Secondary | ICD-10-CM

## 2016-11-06 LAB — COMPREHENSIVE METABOLIC PANEL
ALT: 31 IU/L (ref 0–44)
AST: 23 IU/L (ref 0–40)
Albumin/Globulin Ratio: 1.4 (ref 1.2–2.2)
Albumin: 4.2 g/dL (ref 3.6–4.8)
Alkaline Phosphatase: 84 IU/L (ref 39–117)
BUN/Creatinine Ratio: 19 (ref 10–24)
BUN: 21 mg/dL (ref 8–27)
Bilirubin Total: 0.3 mg/dL (ref 0.0–1.2)
CO2: 24 mmol/L (ref 20–29)
Calcium: 9.9 mg/dL (ref 8.6–10.2)
Chloride: 107 mmol/L — ABNORMAL HIGH (ref 96–106)
Creatinine, Ser: 1.08 mg/dL (ref 0.76–1.27)
GFR calc Af Amer: 85 mL/min/{1.73_m2} (ref >59)
GFR calc non Af Amer: 74 mL/min/{1.73_m2} (ref >59)
Globulin, Total: 3.1 g/dL (ref 1.5–4.5)
Glucose: 154 mg/dL — ABNORMAL HIGH (ref 65–99)
Potassium: 4.5 mmol/L (ref 3.5–5.2)
Sodium: 137 mmol/L (ref 134–144)
Total Protein: 7.3 g/dL (ref 6.0–8.5)

## 2016-11-06 LAB — CBC
Hematocrit: 37.3 % — ABNORMAL LOW (ref 37.5–51.0)
Hemoglobin: 12.6 g/dL — ABNORMAL LOW (ref 13.0–17.7)
MCH: 25.7 pg — ABNORMAL LOW (ref 26.6–33.0)
MCHC: 33.8 g/dL (ref 31.5–35.7)
MCV: 76 fL — ABNORMAL LOW (ref 79–97)
Platelets: 260 10*3/uL (ref 150–379)
RBC: 4.9 x10E6/uL (ref 4.14–5.80)
RDW: 15.9 % — ABNORMAL HIGH (ref 12.3–15.4)
WBC: 8.6 10*3/uL (ref 3.4–10.8)

## 2016-11-06 LAB — POCT URINALYSIS DIP (MANUAL ENTRY)
Bilirubin, UA: NEGATIVE
Glucose, UA: NEGATIVE mg/dL
Ketones, POC UA: NEGATIVE mg/dL
Leukocytes, UA: NEGATIVE
Nitrite, UA: NEGATIVE
Protein Ur, POC: NEGATIVE mg/dL
Spec Grav, UA: >=1.03 — AB (ref 1.010–1.025)
Urobilinogen, UA: 0.2 E.U./dL
pH, UA: 5.5 (ref 5.0–8.0)

## 2016-11-06 LAB — POCT UA - MICROSCOPIC ONLY

## 2016-11-06 NOTE — Patient Instructions (Addendum)
Nail Avulsion Nail avulsion is when a nail tears away from the nail bed due to an accident or an injury. Nail avulsion can be painful. Your finger or toe may bleed a lot, and you may have some pain, redness, throbbing, and swelling while it heals. Your nail will grow back within several months. Once it grows back, it might not look the same. This may happen even after taking good care of it. Follow these instructions at home: Wound care   Clean any dirt and debris from the wound.  If you notice bleeding, press gently on the nailbed with a gauze pad. Do this for 15 minutes.  If a health care provider closed your wound with stitches (sutures), leave them in place. They may need to stay in place for 2 weeks or longer. You may need to see your health care provider to have them removed.  Keep the wound dry for 48 hours. After 48 hours have passed, lightly wash the finger or toe in warm, soapy water 2-3 times a day. This helps to reduce pain and swelling and prevent infection. Dressing Care  Cover the wound with a clean gauze bandage (dressing). You may be able to stop wearing a dressing after 2?7 days.  Wash your hands with soap and water before you change your dressing. If soap and water are not available, use hand sanitizer.  Change the dressing once or twice a day. Always change the dressing: ? If the dressing gets wet or dirty. ? After washing your finger or toe. Medicine  Take over-the-counter pain medicine as needed. Do not take aspirin or products containing aspirin unless directed by your health care provider. These products can increase bleeding.  If you were prescribed an antibiotic medicine, take it or apply it as told by your health care provider. Do not stop taking or using the antibiotic even if you start to feel better. General instructions  Keep the hand or foot with the nail injury raised above the level of your heart as much as possible. This helps to reduce pain and  swelling.  Move the toe or finger often to avoid stiffness.  Do not smoke. Smoking can delay healing. If you need help quitting, talk to your health care provider. Contact a health care provider if:  You have swelling or pain that gets worse instead of better.  You have fluid, blood, or pus coming from your wound.  Your wound smells bad. Get help right away if:  You have bleeding that does not stop, even when you apply pressure to the wound.  You have a temperature that is higher than 104F (40C).  You cannot move your fingers or toes.  The affected finger or toe looks white or black. This information is not intended to replace advice given to you by your health care provider. Make sure you discuss any questions you have with your health care provider. Document Released: 04/05/2004 Document Revised: 10/27/2015 Document Reviewed: 07/14/2014 Elsevier Interactive Patient Education  2018 [employer] Inc[employer]  

## 2016-11-06 NOTE — Progress Notes (Signed)
   Subjective:    Patient ID: Alex [name], male    DOB: Sep 12, 1955, 61 y.o.   MRN: [id number]   CC: Dark Urine   HPI: Patient is 61 yo male with a past medical history significant for T2DM, GERD, gout, HLD, asthma who presents today complaining of dark urine. Patient reports that he did not noticed it himself until his wife mentioned it to him. Patient reports that it has been going for a few days. He denies any dysuria, blood in stool, fever, chills, abdominal pain, nausea, vomiting or weight loss. Patient has a history of hematuria in 2016 and was referred to [facility] for work up.Patient had  A CT scan and and cystoscopy done with no further work up. Imaging showed an incidental right kidney cyst.  Smoking status reviewed   ROS: all other systems were reviewed and are negative other than in the HPI   Past Medical History:  Diagnosis Date  . Asthma   . CAD (coronary artery disease)    Cardiac cath 2002 with 25% distal RCA stenosis.   . DDD (degenerative disc disease), lumbar   . Diabetes mellitus ([place])   . Esophageal stricture   . Gastritis 2010  . GERD (gastroesophageal reflux disease)   . Gout   . HTN (hypertension)   . Hyperlipidemia   . Insomnia   . MI (myocardial infarction) ([place]) 2009   no stent placement  . OA (osteoarthritis)   . Obesity   . OSA on CPAP   . Seasonal allergies   . TIA (transient ischemic attack) 2013    Past Surgical History:  Procedure Laterality Date  . ESOPHAGOGASTRODUODENOSCOPY     multiple  . NASAL SINUS SURGERY    . VASECTOMY      Past medical history, surgical, family, and social history reviewed and updated in the EMR as appropriate.  Objective:  BP (!) 148/90   Pulse 77   Temp 97.9 F (36.6 C) (Oral)   Wt (!) 327 lb (148.3 kg)   SpO2 98%   BMI 51.22 kg/m   Vitals and nursing note reviewed  General: NAD, pleasant, able to participate in exam Cardiac: RRR, normal heart sounds, no murmurs. 2+ radial and PT pulses  bilaterally Respiratory: CTAB, normal effort, No wheezes, rales or rhonchi Abdomen: soft, nontender, nondistended, no hepatic or splenomegaly, +BS Extremities: no edema or cyanosis. WWP. Skin: warm and dry, no rashes noted Neuro: alert and oriented x4, no focal deficits Psych: Normal affect and mood   Assessment & Plan:   #Dark urine  Patient has a long history of hematuria and was seen by [facility] in 2016 for hematuria. Patient has CT scan and cystoscopy done though we do not have results from cystoscopy on records. No red flags signs concerning for infection or malignancy such as fever, weight loss, dysuria. Patient kidney function was within normal limit a few months ago. Differential include dehydration, hematuria, ingestion (patient reports drinking apple cider vinegar mixed with blueberry juice and cinnamon).  --Will order CBC, CMP and UA --Obtain record from Urology    Marjie Skiff, MD [place] PGY-2

## 2016-11-06 NOTE — Progress Notes (Signed)
SUBJECTIVE:  Alex [name] is a 61 y.o. male who presents for palliative wedge resection of an ingrown toenail.   OBJECTIVE: Patient appears well, normal vital signs. Left big toe nail reveals ingrown edge with tenderness,   ASSESSMENT: Ingrown toenail  PLAN: Informed consent is obtained. Using a 50-50 mixture of 2% plain lidocaine a ring block was done (6 cc total). I freed the nail from the nail bed and removed a wedge of the nail including the ingrown portion to the level of the nail skin fold. This was well tolerated, minimal bleeding. Antibiotic ointment and a dressing are applied.Ibuprofen 1-2 tabs po q4h prn pain is given. Remove the dressing tomorrow and begin frequent soaks, complete his antibiotics and have a follow up visit in a week. Call if pain, erythema fever or bleeding. Wound care and dressing instructions are given.

## 2016-12-04 ENCOUNTER — Other Ambulatory Visit: Payer: Self-pay | Admitting: Family Medicine

## 2016-12-07 ENCOUNTER — Other Ambulatory Visit: Payer: Self-pay | Admitting: *Deleted

## 2016-12-07 MED ORDER — TRIAMCINOLONE ACETONIDE 0.5 % EX OINT: TOPICAL_OINTMENT | CUTANEOUS | 0 refills | Status: DC

## 2017-01-02 ENCOUNTER — Encounter: Payer: Self-pay | Admitting: Family Medicine

## 2017-02-07 ENCOUNTER — Encounter: Payer: Self-pay | Admitting: Family Medicine

## 2017-02-07 ENCOUNTER — Ambulatory Visit (INDEPENDENT_AMBULATORY_CARE_PROVIDER_SITE_OTHER): Payer: PPO | Admitting: Family Medicine

## 2017-02-07 ENCOUNTER — Other Ambulatory Visit: Payer: Self-pay

## 2017-02-07 VITALS — BP 124/76 | HR 60 | Temp 98.0°F | Ht 67.0 in | Wt 317.4 lb

## 2017-02-07 DIAGNOSIS — E119 Type 2 diabetes mellitus without complications: Secondary | ICD-10-CM

## 2017-02-07 DIAGNOSIS — Z23 Encounter for immunization: Secondary | ICD-10-CM | POA: Diagnosis not present

## 2017-02-07 LAB — POCT GLYCOSYLATED HEMOGLOBIN (HGB A1C): Hemoglobin A1C: 6.4

## 2017-02-07 MED ORDER — METFORMIN HCL 500 MG PO TABS: 500.0000 mg | ORAL_TABLET | Freq: Every day | ORAL | 0 refills | Status: DC

## 2017-02-07 NOTE — Patient Instructions (Signed)
It was great seeing you today! We have addressed the following issues today  1. I will refer you to a Diabetic education class where you will discus diet. 2. I want you to exercise at least 3-4 times a week. Start with walking 15-20 min. Please join the [facility] as discussed. 3. You lost 10 lbs on your own, so keep up the good work from a dietary standpoint 4. I will refer you to the eye doctor, you will get a call for an appointment. 5. I am starting you on Metformin take one pill a day. 6. I will se you in three months.  If we did any lab work today, and the results require attention, either me or my nurse will get in touch with you. If everything is normal, you will get a letter in mail and a message via . If you don't hear from [place] in two weeks, please give [place] a call. Otherwise, we look forward to seeing you again at your next visit. If you have any questions or concerns before then, please call the clinic at [phone].  Please bring all your medications to every doctors visit  Sign up for My Chart to have easy access to your labs results, and communication with your Primary care physician. Please ask Front Desk for some assistance.   Please check-out at the front desk before leaving the clinic.    Take Care,   Dr. Andy Gauss

## 2017-02-07 NOTE — Progress Notes (Signed)
Subjective:    Patient ID: Alex [name], male    DOB: 01-Feb-1956, 61 y.o.   MRN: [id number]   CC: Type 2 diabetes follow-up and weight loss  HPI: Patient is a 61 year old male who presents today to follow-up on diabetes management and discuss weight loss.  Patient has A1c was 6.4.  Patient reports that he has made some dietary changes and has noticed that he has been losing some weight.  Patient would like to discuss strategy for further weight loss.  Patient is currently on no glycemic control medication.  Patient was previously on metformin, but was stopped for unclear reasons.  Patient denies any polyuria, polydipsia, chest pain, shortness of breath, nausea, vomiting, headache.  Smoking status reviewed   ROS: all other systems were reviewed and are negative other than in the HPI   Past Medical History:  Diagnosis Date  . Asthma   . CAD (coronary artery disease)    Cardiac cath 2002 with 25% distal RCA stenosis.   . DDD (degenerative disc disease), lumbar   . Diabetes mellitus ([place])   . Esophageal stricture   . Gastritis 2010  . GERD (gastroesophageal reflux disease)   . Gout   . HTN (hypertension)   . Hyperlipidemia   . Insomnia   . MI (myocardial infarction) ([place]) 2009   no stent placement  . OA (osteoarthritis)   . Obesity   . OSA on CPAP   . Seasonal allergies   . TIA (transient ischemic attack) 2013    Past Surgical History:  Procedure Laterality Date  . ESOPHAGOGASTRODUODENOSCOPY     multiple  . NASAL SINUS SURGERY    . VASECTOMY      Past medical history, surgical, family, and social history reviewed and updated in the EMR as appropriate.  Objective:  BP 124/76   Pulse 60   Temp 98 F (36.7 C) (Oral)   Ht 5\' 7"  (1.702 m)   Wt (!) 317 lb 6.4 oz (144 kg)   SpO2 99%   BMI 49.71 kg/m   Vitals and nursing note reviewed  General: NAD, pleasant, able to participate in exam Cardiac: RRR, normal heart sounds, no murmurs. 2+ radial and PT pulses  bilaterally Respiratory: CTAB, normal effort, No wheezes, rales or rhonchi Abdomen: soft, nontender, nondistended, no hepatic or splenomegaly, +BS Extremities: no edema or cyanosis. WWP. Skin: warm and dry, no rashes noted Neuro: alert and oriented x4, no focal deficits Psych: Normal affect and mood   Assessment & Plan:   #Type 2 diabetes follow-up A1c today 6.4 unchanged from 3 months ago.  Patient has not been on any controlled meds.  Given age and mild diabetes will likely start patient on low-dose oral glycemic control medication today.  Encourage patient to continue dietary changes.  Patient is interested in diabetic education.  Foot exam was unremarkable.  Will refer to ophthalmology. --Start metformin 500 mg daily will titrate as needed --Refer to ophthalmology --Referred to diabetic education  #Obesity, improving Patient has lost 10 pounds since last visit.  BMI has improved from 51 to 49 and patient is currently very interested in further weight loss.  Patient has made several dietary changes that have led to current progress in weight loss.  Will refer him to diabetic education when he will receive further information on how to improve his diet.  Patient will also start exercising, and is planning to join the [facility].  Patient has a silver sneaker as part of his Medicare plan.  We will follow-up on dietary changes and diabetic indication classes.  Flu vaccine administered today.   Marjie Skiff, MD [place] PGY-2

## 2017-02-14 ENCOUNTER — Other Ambulatory Visit: Payer: Self-pay | Admitting: Family Medicine

## 2017-02-14 MED ORDER — NAPROXEN 250 MG PO TABS: 500.0000 mg | ORAL_TABLET | Freq: Two times a day (BID) | ORAL | 0 refills | Status: DC

## 2017-02-14 NOTE — Telephone Encounter (Signed)
Pt needs refill for naproxen sent to [facility] on [address] He forgot to mention this at his last appt. Please advise

## 2017-02-18 ENCOUNTER — Ambulatory Visit: Payer: PPO

## 2017-02-22 ENCOUNTER — Other Ambulatory Visit: Payer: Self-pay | Admitting: Family Medicine

## 2017-02-26 ENCOUNTER — Other Ambulatory Visit: Payer: Self-pay | Admitting: *Deleted

## 2017-02-26 MED ORDER — LISINOPRIL 40 MG PO TABS: 40.0000 mg | ORAL_TABLET | Freq: Every day | ORAL | 3 refills | Status: DC

## 2017-02-27 ENCOUNTER — Other Ambulatory Visit: Payer: Self-pay | Admitting: Family Medicine

## 2017-03-13 DIAGNOSIS — E119 Type 2 diabetes mellitus without complications: Secondary | ICD-10-CM | POA: Diagnosis not present

## 2017-04-24 ENCOUNTER — Ambulatory Visit (INDEPENDENT_AMBULATORY_CARE_PROVIDER_SITE_OTHER): Payer: PPO | Admitting: Family Medicine

## 2017-04-24 ENCOUNTER — Other Ambulatory Visit: Payer: Self-pay

## 2017-04-24 VITALS — BP 138/100 | HR 68 | Temp 98.4°F | Wt 312.6 lb

## 2017-04-24 DIAGNOSIS — J069 Acute upper respiratory infection, unspecified: Secondary | ICD-10-CM | POA: Diagnosis not present

## 2017-04-24 MED ORDER — BENZONATATE 100 MG PO CAPS
100.0000 mg | ORAL_CAPSULE | Freq: Three times a day (TID) | ORAL | 0 refills | Status: DC | PRN
Start: 2017-04-24 — End: 2017-09-06

## 2017-04-24 NOTE — Progress Notes (Signed)
    Subjective:  Alex [name] is a 62 y.o. male who presents to the [facility] today with a chief complaint of upper respiratory symptoms.   HPI:  URI  Major symptoms: productive cough with brown sputum, scratchy throat, some SOB, weakness Has been sick on and off for a month Progression: worsening Medications tried: mucinex, ibuprofen Sick contacts: unsure Patient believes may be caused by bronchitis  Symptoms Fever: unsure Headache or face pain: some Tooth pain: none Sneezing: yes Scratchy throat: yes Allergies: yes Muscle aches: not Severe fatigue: yes Stiff neck: yes Shortness of breath: yes Rash: no Sore throat or swollen glands: sore throat   ROS see HPI Smoking Status noted   PMH: CAD, HTN, OSA, T2DM, HFpEF Tobacco use: remote smoker Medication: reviewed and updated ROS: see HPI   Objective:  Physical Exam: BP (!) 138/100 (BP Location: Left Arm, Patient Position: Sitting, Cuff Size: Large) Comment: no meds this am  Pulse 68   Temp 98.4 F (36.9 C) (Oral)   Wt (!) 312 lb 9.6 oz (141.8 kg)   SpO2 97%   BMI 48.96 kg/m   Gen: 62yo M in NAD, resting comfortably CV: RRR with no murmurs appreciated Pulm: NWOB, CTAB with no crackles, wheezes, or rhonchi GI: Normal bowel sounds present. Soft, Nontender, Nondistended. MSK: no edema, cyanosis, or clubbing noted Skin: warm, dry Neuro: grossly normal, moves all extremities Psych: Normal affect and thought content  No results found for this or any previous visit (from the past 72 hour(s)).   Assessment/Plan:  Acute viral upper respiratory infection Patient reports symptoms consistent with viral upper respiratory tract infection without red flag signs or symptoms or any constitutional symptoms.  Recommended conservative management with strict return precautions.  Symptoms should be self-limiting.  Alex [name] L. Rosalyn Gess, [place] [facility] Resident PGY-2 04/29/2017 8:06 AM

## 2017-04-24 NOTE — Patient Instructions (Signed)
Alex [name], you were seen today for productive cough. I believe this is due to a virus.  Fortunately you do not have any fevers or physical exam findings consistent with pneumonia or bronchitis.  If you do develop fevers, worsening productive cough, chest pain or wheezing any to come back to the office to be evaluated.  Otherwise this should resolve on its own.   Continue to drink plenty of fluids, use Tylenol and ibuprofen as needed, Mucinex as needed and I also prescribed you a medication called Tessalon to help with cough.   Take care, Alex [name] L. Rosalyn Gess, [place] [facility] Resident PGY-2 04/24/2017 9:50 AM

## 2017-04-29 ENCOUNTER — Encounter: Payer: Self-pay | Admitting: Family Medicine

## 2017-05-01 ENCOUNTER — Other Ambulatory Visit: Payer: Self-pay | Admitting: Family Medicine

## 2017-05-01 ENCOUNTER — Telehealth: Payer: Self-pay | Admitting: Family Medicine

## 2017-05-01 NOTE — Progress Notes (Unsigned)
guai

## 2017-05-01 NOTE — Telephone Encounter (Signed)
Page,   Patient will need to be evaluated in clinic before we can write antibiotics. He can make an appointment.  Thanks  Marjie Skiff, MD [place], PGY-2

## 2017-05-01 NOTE — Telephone Encounter (Signed)
Pt is still sick. The cough medicine is not working (mucinex-d).  He would like to get something for the cough and an antibotic.  [employer].  Please advise

## 2017-05-06 ENCOUNTER — Other Ambulatory Visit: Payer: Self-pay | Admitting: Family Medicine

## 2017-05-09 ENCOUNTER — Other Ambulatory Visit: Payer: Self-pay | Admitting: Family Medicine

## 2017-05-09 MED ORDER — AMLODIPINE BESYLATE 10 MG PO TABS: 10.0000 mg | ORAL_TABLET | Freq: Every day | ORAL | 3 refills | Status: DC

## 2017-05-09 NOTE — Telephone Encounter (Signed)
Patient needs to know if he is supposed to stay on his BP meds or should he come in for an office visit. Please call him at [phone]

## 2017-05-09 NOTE — Telephone Encounter (Signed)
Pt called for refill on amlodipine.. Request sent to Dr. Jerline Pain by accident and denied.  Pharmacy- Insurance claims handler on [address]. Wallace Cullens, RN

## 2017-07-29 ENCOUNTER — Other Ambulatory Visit: Payer: Self-pay | Admitting: Family Medicine

## 2017-07-29 MED ORDER — CARVEDILOL 25 MG PO TABS: 25.0000 mg | ORAL_TABLET | Freq: Two times a day (BID) | ORAL | 3 refills | Status: DC

## 2017-07-29 NOTE — Telephone Encounter (Signed)
I am no longer pt's PCP. Please send to his PCP.  Alex [name]. Jerline Pain, MD 07/29/2017 1:40 PM

## 2017-07-29 NOTE — Telephone Encounter (Signed)
Sent rx refill to Dr. Andy Gauss his PCP.

## 2017-07-29 NOTE — Telephone Encounter (Signed)
Original RX on  07/10/2016. Pt has multiple doctors prescribing medications.

## 2017-08-06 NOTE — Progress Notes (Signed)
   [facility] Phone: [phone]   Date of Visit: 08/07/2017   HPI:  Cough:  - patient has had cough for the past 3 months or so -He was seen in clinic in February and diagnosed with a viral upper respiratory tract infection.  Most of his symptoms resolved shortly afterwards but his cough persisted. -He reports his cough is productive with clear phlegm and the cough is worse at night.  His throat gets scratchy when it is hot outside.  He reports that he wheezes sometimes.  No fevers or chills. -Reports of stable dyspnea on exertion and sleeps on 2 pillows at night which has not changed -Reports of past history of asthma as a child however has not had issues "in years" - He does have a history of a 10pack-year smoking history, however has quit smoking about 30 years ago.  Denies any postnasal drip or nasal congestion  or rhinorrhea or watery eyes. -Denies having issues with heartburn since he is on omeprazole. - He has been on lisinopril for "years" and has not had any issues  ROS: See HPI.  [place]:  PMH: HTN PVD with claudication HFpEF CAD Hx TIS OSA GERD DM2 Lumbar DDD Obesity Gout History of Tobacco Use  PHYSICAL EXAM: BP (!) 144/78   Pulse 71   Temp 98.4 F (36.9 C) (Oral)   Wt (!) 305 lb (138.3 kg)   SpO2 97%   BMI 47.77 kg/m  GEN: NAD  HEENT: Atraumatic, normocephalic, neck supple without lymphadenopathy, EOMI, sclera clear, oropharynx is unremarkable CV: RRR, no murmurs, rubs, or gallops PULM: CTAB, normal effort SKIN: No rash or cyanosis; warm and well-perfused EXTR: No lower extremity edema or calf tenderness   PSYCH: Mood and affect euthymic, normal rate and volume of speech NEURO: Awake, alert, no focal deficits grossly, normal speech   ASSESSMENT/PLAN:   1. Chronic cough With his presenting symptoms, it is unlikely that this is related to allergies or postnasal drip.  Considered reflux, however this seems to be controlled  with omeprazole.  He reports of a remote history of asthma in his childhood but has not had any issues in many years.  Therefore, it would be odd for him to start having symptoms of asthma now.  Considered COPD.  He has a 5 to 10-year history of smoking he smoked 1 pack/day, however this has been over 30 years ago.  Therefore I think COPD is a less likely cause.  Cannot be contributed to post viral cough as it has been over 3 months since he has had URI symptoms.  Unlikely this is cardiac related (heart failure);clinically euvolemic on exam.  Etiology highest on the differential is medication related as he is on an ACE inhibitor.  I would like to get a chest x-ray for further evaluation.  Additionally, prior to discontinuing his lisinopril, I need to figure out which medication I can replace lisinopril with.  He has a history of gout, therefore hydrochlorothiazide is appropriate.  He is already on Coreg 25 mg twice daily and Lasix 40 mg daily.  Ideally, with his history of diabetes, and ARB would  be appropriate.  However, there has been recalls on multiple ARB's.  Will discuss with Dr. Valentina Lucks and call patient.   If his symptoms do not resolve with discontinuing lisinopril, would consider pulmonary function tests for further evaluation. - DG Chest 2 View; Future  Smiley Houseman, MD PGY [place]

## 2017-08-07 ENCOUNTER — Ambulatory Visit (INDEPENDENT_AMBULATORY_CARE_PROVIDER_SITE_OTHER): Payer: PPO | Admitting: Internal Medicine

## 2017-08-07 ENCOUNTER — Encounter: Payer: Self-pay | Admitting: Internal Medicine

## 2017-08-07 ENCOUNTER — Ambulatory Visit (HOSPITAL_COMMUNITY)
Admission: RE | Admit: 2017-08-07 | Discharge: 2017-08-07 | Disposition: A | Discharge status: Home / Self Care | Payer: PPO | Source: Ambulatory Visit | Attending: Family Medicine | Admitting: Family Medicine

## 2017-08-07 ENCOUNTER — Other Ambulatory Visit: Payer: Self-pay

## 2017-08-07 VITALS — BP 144/78 | HR 71 | Temp 98.4°F | Wt 305.0 lb

## 2017-08-07 DIAGNOSIS — R05 Cough: Secondary | ICD-10-CM

## 2017-08-07 DIAGNOSIS — R053 Chronic cough: Secondary | ICD-10-CM

## 2017-08-07 DIAGNOSIS — Z87891 Personal history of nicotine dependence: Secondary | ICD-10-CM | POA: Insufficient documentation

## 2017-08-07 NOTE — Patient Instructions (Signed)
1) lets get a chest xray  2) your cough might be due to the Lisinopril. I will call you tomorrow about which medication I want you to switch to. In the mean time, continue the Lisinopril.

## 2017-08-08 ENCOUNTER — Telehealth: Payer: Self-pay | Admitting: Internal Medicine

## 2017-08-08 DIAGNOSIS — I10 Essential (primary) hypertension: Secondary | ICD-10-CM

## 2017-08-08 MED ORDER — LOSARTAN POTASSIUM 100 MG PO TABS: 100.0000 mg | ORAL_TABLET | Freq: Every day | ORAL | 0 refills | Status: DC

## 2017-08-08 NOTE — Telephone Encounter (Signed)
Called patient to inform CXR is normal. Recommended discontinuing Lisinopril 40mg  to Losartan 100mg  daily to see if this will resolve his chronic cough. Questions answered.

## 2017-08-08 NOTE — Assessment & Plan Note (Signed)
Discontinue Lisinopril 40mg  to Losartan 100mg  daily to see if this will resolve his chronic cough. Patient to follow up in 1-2 weeks if symptoms do not resolve.

## 2017-09-05 ENCOUNTER — Other Ambulatory Visit: Payer: Self-pay

## 2017-09-05 ENCOUNTER — Emergency Department (HOSPITAL_COMMUNITY): Payer: PPO

## 2017-09-05 ENCOUNTER — Encounter (HOSPITAL_COMMUNITY): Payer: Self-pay | Admitting: Emergency Medicine

## 2017-09-05 ENCOUNTER — Observation Stay (HOSPITAL_COMMUNITY)
Admission: EM | Admit: 2017-09-05 | Discharge: 2017-09-06 | Disposition: A | Discharge status: Home / Self Care | Payer: PPO | Attending: Family Medicine | Admitting: Family Medicine

## 2017-09-05 DIAGNOSIS — E669 Obesity, unspecified: Secondary | ICD-10-CM | POA: Insufficient documentation

## 2017-09-05 DIAGNOSIS — I252 Old myocardial infarction: Secondary | ICD-10-CM | POA: Diagnosis not present

## 2017-09-05 DIAGNOSIS — R42 Dizziness and giddiness: Secondary | ICD-10-CM | POA: Diagnosis not present

## 2017-09-05 DIAGNOSIS — Z6841 Body Mass Index (BMI) 40.0 and over, adult: Secondary | ICD-10-CM | POA: Diagnosis not present

## 2017-09-05 DIAGNOSIS — E119 Type 2 diabetes mellitus without complications: Secondary | ICD-10-CM | POA: Diagnosis not present

## 2017-09-05 DIAGNOSIS — K219 Gastro-esophageal reflux disease without esophagitis: Secondary | ICD-10-CM | POA: Diagnosis not present

## 2017-09-05 DIAGNOSIS — Z7982 Long term (current) use of aspirin: Secondary | ICD-10-CM | POA: Insufficient documentation

## 2017-09-05 DIAGNOSIS — I2510 Atherosclerotic heart disease of native coronary artery without angina pectoris: Secondary | ICD-10-CM | POA: Diagnosis not present

## 2017-09-05 DIAGNOSIS — Z79899 Other long term (current) drug therapy: Secondary | ICD-10-CM | POA: Insufficient documentation

## 2017-09-05 DIAGNOSIS — Z888 Allergy status to other drugs, medicaments and biological substances status: Secondary | ICD-10-CM | POA: Diagnosis not present

## 2017-09-05 DIAGNOSIS — R0789 Other chest pain: Secondary | ICD-10-CM | POA: Diagnosis not present

## 2017-09-05 DIAGNOSIS — I119 Hypertensive heart disease without heart failure: Secondary | ICD-10-CM | POA: Insufficient documentation

## 2017-09-05 DIAGNOSIS — Z8673 Personal history of transient ischemic attack (TIA), and cerebral infarction without residual deficits: Secondary | ICD-10-CM | POA: Diagnosis not present

## 2017-09-05 DIAGNOSIS — N179 Acute kidney failure, unspecified: Secondary | ICD-10-CM | POA: Insufficient documentation

## 2017-09-05 DIAGNOSIS — Z7984 Long term (current) use of oral hypoglycemic drugs: Secondary | ICD-10-CM | POA: Diagnosis not present

## 2017-09-05 DIAGNOSIS — E860 Dehydration: Secondary | ICD-10-CM

## 2017-09-05 DIAGNOSIS — G4733 Obstructive sleep apnea (adult) (pediatric): Secondary | ICD-10-CM | POA: Diagnosis not present

## 2017-09-05 DIAGNOSIS — E785 Hyperlipidemia, unspecified: Secondary | ICD-10-CM | POA: Insufficient documentation

## 2017-09-05 DIAGNOSIS — I25810 Atherosclerosis of coronary artery bypass graft(s) without angina pectoris: Secondary | ICD-10-CM

## 2017-09-05 DIAGNOSIS — Z91013 Allergy to seafood: Secondary | ICD-10-CM | POA: Diagnosis not present

## 2017-09-05 DIAGNOSIS — Z885 Allergy status to narcotic agent status: Secondary | ICD-10-CM | POA: Diagnosis not present

## 2017-09-05 DIAGNOSIS — I152 Hypertension secondary to endocrine disorders: Secondary | ICD-10-CM

## 2017-09-05 DIAGNOSIS — Z87891 Personal history of nicotine dependence: Secondary | ICD-10-CM | POA: Insufficient documentation

## 2017-09-05 DIAGNOSIS — R531 Weakness: Secondary | ICD-10-CM | POA: Insufficient documentation

## 2017-09-05 DIAGNOSIS — I10 Essential (primary) hypertension: Secondary | ICD-10-CM | POA: Diagnosis not present

## 2017-09-05 DIAGNOSIS — R55 Syncope and collapse: Secondary | ICD-10-CM

## 2017-09-05 DIAGNOSIS — E1159 Type 2 diabetes mellitus with other circulatory complications: Secondary | ICD-10-CM

## 2017-09-05 LAB — URINALYSIS, ROUTINE W REFLEX MICROSCOPIC
Bacteria, UA: NONE SEEN
Bilirubin Urine: NEGATIVE
Glucose, UA: NEGATIVE mg/dL
Ketones, ur: NEGATIVE mg/dL
Leukocytes, UA: NEGATIVE
Nitrite: NEGATIVE
Protein, ur: NEGATIVE mg/dL
Specific Gravity, Urine: 1.012 (ref 1.005–1.030)
pH: 5 (ref 5.0–8.0)

## 2017-09-05 LAB — BASIC METABOLIC PANEL
Anion gap: 11 (ref 5–15)
BUN: 17 mg/dL (ref 8–23)
CO2: 24 mmol/L (ref 22–32)
Calcium: 9.4 mg/dL (ref 8.9–10.3)
Chloride: 105 mmol/L (ref 98–111)
Creatinine, Ser: 1.4 mg/dL — ABNORMAL HIGH (ref 0.61–1.24)
GFR calc Af Amer: >60 mL/min (ref >60)
GFR calc non Af Amer: 52 mL/min — ABNORMAL LOW (ref >60)
Glucose, Bld: 107 mg/dL — ABNORMAL HIGH (ref 70–99)
Potassium: 4.1 mmol/L (ref 3.5–5.1)
Sodium: 140 mmol/L (ref 135–145)

## 2017-09-05 LAB — CBC
HCT: 43.6 % (ref 39.0–52.0)
Hemoglobin: 13.5 g/dL (ref 13.0–17.0)
MCH: 25.3 pg — ABNORMAL LOW (ref 26.0–34.0)
MCHC: 31 g/dL (ref 30.0–36.0)
MCV: 81.6 fL (ref 78.0–100.0)
Platelets: 270 10*3/uL (ref 150–400)
RBC: 5.34 MIL/uL (ref 4.22–5.81)
RDW: 15.4 % (ref 11.5–15.5)
WBC: 7 10*3/uL (ref 4.0–10.5)

## 2017-09-05 LAB — I-STAT TROPONIN, ED: Troponin i, poc: 0 ng/mL (ref 0.00–0.08)

## 2017-09-05 LAB — CK: Total CK: 514 U/L — ABNORMAL HIGH (ref 49–397)

## 2017-09-05 LAB — CBG MONITORING, ED: Glucose-Capillary: 99 mg/dL (ref 70–99)

## 2017-09-05 LAB — TROPONIN I: Troponin I: <0.03 ng/mL (ref <0.03)

## 2017-09-05 LAB — BRAIN NATRIURETIC PEPTIDE: B Natriuretic Peptide: 20.5 pg/mL (ref 0.0–100.0)

## 2017-09-05 MED ORDER — SIMVASTATIN 20 MG PO TABS
20.0000 mg | ORAL_TABLET | Freq: Every day | ORAL | Status: DC
  Administered 2017-09-05: 20 mg via ORAL
  Filled 2017-09-05: qty 1

## 2017-09-05 MED ORDER — METFORMIN HCL 500 MG PO TABS
500.0000 mg | ORAL_TABLET | Freq: Every day | ORAL | Status: DC
  Administered 2017-09-06: 500 mg via ORAL
  Filled 2017-09-05: qty 1

## 2017-09-05 MED ORDER — PANTOPRAZOLE SODIUM 40 MG PO TBEC
40.0000 mg | DELAYED_RELEASE_TABLET | Freq: Every day | ORAL | Status: DC
  Administered 2017-09-05: 40 mg via ORAL
  Filled 2017-09-05 (×2): qty 1

## 2017-09-05 MED ORDER — LOSARTAN POTASSIUM 50 MG PO TABS
100.0000 mg | ORAL_TABLET | Freq: Every day | ORAL | Status: DC
  Administered 2017-09-05: 100 mg via ORAL
  Filled 2017-09-05 (×2): qty 2

## 2017-09-05 MED ORDER — ALLOPURINOL 100 MG PO TABS
100.0000 mg | ORAL_TABLET | Freq: Every day | ORAL | Status: DC
  Filled 2017-09-05: qty 1

## 2017-09-05 MED ORDER — SODIUM CHLORIDE 0.9 % IV BOLUS
1000.0000 mL | Freq: Once | INTRAVENOUS | Status: AC
  Administered 2017-09-05: 1000 mL via INTRAVENOUS

## 2017-09-05 MED ORDER — ASPIRIN 81 MG PO CHEW
81.0000 mg | CHEWABLE_TABLET | Freq: Every day | ORAL | Status: DC
  Administered 2017-09-05: 81 mg via ORAL
  Filled 2017-09-05 (×2): qty 1

## 2017-09-05 MED ORDER — CARVEDILOL 25 MG PO TABS
25.0000 mg | ORAL_TABLET | Freq: Two times a day (BID) | ORAL | Status: DC
  Filled 2017-09-05: qty 1

## 2017-09-05 MED ORDER — AMLODIPINE BESYLATE 10 MG PO TABS
10.0000 mg | ORAL_TABLET | Freq: Every day | ORAL | Status: DC
  Administered 2017-09-05: 10 mg via ORAL
  Filled 2017-09-05 (×2): qty 1

## 2017-09-05 MED ORDER — ENOXAPARIN SODIUM 40 MG/0.4ML ~~LOC~~ SOLN
40.0000 mg | SUBCUTANEOUS | Status: DC
  Administered 2017-09-05: 40 mg via SUBCUTANEOUS
  Filled 2017-09-05: qty 0.4

## 2017-09-05 MED ORDER — SODIUM CHLORIDE 0.9 % IV BOLUS
500.0000 mL | Freq: Once | INTRAVENOUS | Status: AC
  Administered 2017-09-05: 500 mL via INTRAVENOUS

## 2017-09-05 NOTE — ED Provider Notes (Signed)
Patient placed in Quick Look pathway, seen and evaluated   Chief Complaint: dizziness, weakness  HPI:   Alex [name] is a 62 y.o. male who presents to the ED with weakness and dizziness. Patient reports he was working outside on his car in the heat when he started feeling dizzy and weak.   ROS: neuro: dizziness, weakness  Physical Exam:  BP (!) 147/108 (BP Location: Right Arm)   Pulse 86   Temp 99.4 F (37.4 C) (Oral)   Resp 16   SpO2 99%    Gen: No distress  Neuro: Awake and Alert  Skin: Warm and dry  Heart: regular rate and rhythm  Neck: no JVD,   Lungs: Clear     Initiation of care has begun. The patient has been counseled on the process, plan, and necessity for staying for the completion/evaluation, and the remainder of the medical screening examination    Ashley Murrain, NP 09/05/17 [place], MD 09/13/17 [phone]

## 2017-09-05 NOTE — H&P (Signed)
[facility] Admission History and Physical Service Pager: [phone]  Patient name: Alex [name] Medical record number: [id number] Date of birth: 1955-07-19 Age: 62 y.o. Gender: male  Primary Care Provider: Marjie Skiff, MD Consultants: none Code Status: full  Chief Complaint: presyncope  Assessment and Plan: Alex [name] is a 62 y.o. male presenting with presyncope. PMH is significant for DM, HTN, HLD, CAD with h/o MI, OSA,  h/o TIA.  Presyncope.  Likely due to dehydration.  Patient does have a significant cardiac history with  CAD and h/o MI but only had mild intermittent chest pain that has since self resolved.  Point-of-care troponin 0.0 in the ED and EKG showing an old first-degree AV block.  Chest x-ray is negative as well.  Patient's CK is elevated to 514 however per chart review appears to be chronically elevated between 300-600.   -Place in observation -Troponin I x1 -Monitor on telemetry -am EKG  Elevated creatinine.  Serum creatinine 1.4 from baseline of 1.1 likely in the setting of dehydration. -Monitor BMP -Encourage good p.o. Intake  Diabetes, controlled.  Last A1c 6.4 on 02/07/2017 -Monitor glucose on morning BMP -Recheck A1c -Continue home metformin 500 mg twice daily  Hypertension.  Blood pressure on admission 137/91 -Monitor vitals -Continue home carvedilol, amlodipine, losartan  Hyperlipidemia.  CK may be chronically elevated due to statin.  Patient does not have any myalgias.  Last LDL 118 on 09/29/2015 -Continue home simvastatin -Lipid panel  OSA -CPAP at night  FEN/GI: hearty healthy carb mod, protonix Prophylaxis: lovenox  Disposition: place in obs  History of Present Illness:  Alex [name] is a 62 y.o. male presenting with presyncope.  He states that he took a longer walk than usual today, came home picked up a car part and went outside to work on his car in the heat.  He was sweating and very active but felt  well.  He had been drinking lots of cold water.  However he started to feel some lightheadedness and dizziness and a sensation like he was going to pass out.  He did not lose consciousness.  He did not fall.  He did not have any vision changes.  He did have some mild substernal chest pain while he was working on his car.  He states that this chest pain is intermittent, not associated with shortness of breath, nausea, diaphoresis.  Chest pain self resolved and has not recurred.  He does not have any focal weakness, numbness or tingling  Review Of Systems: Per HPI with the following additions:   Review of Systems  Constitutional: Negative for chills, diaphoresis, fever and malaise/fatigue.  Eyes: Negative for blurred vision, double vision and photophobia.  Respiratory: Negative for shortness of breath.   Cardiovascular: Positive for chest pain. Negative for palpitations, orthopnea and leg swelling.  Gastrointestinal: Negative for abdominal pain, constipation, diarrhea, heartburn, nausea and vomiting.  Genitourinary: Negative for dysuria, frequency and urgency.  Neurological: Positive for dizziness. Negative for speech change, focal weakness, seizures and weakness.    Patient Active Problem List   Diagnosis Date Noted  . Ingrown toenail of left foot with infection 11/02/2016  . Screen for STD (sexually transmitted disease) 08/14/2016  . Fatigue 11/12/2014  . Vision changes 07/23/2014  . Right foot strain 05/14/2014  . GERD (gastroesophageal reflux disease) 12/17/2013  . Seasonal allergies 06/15/2013  . Dyspnea 05/16/2013  . Headache 12/03/2012  . Rash and nonspecific skin eruption 10/07/2012  . Internal hemorrhoids with other  complication 25/36/6440  . Coronary artery disease 07/14/2012  . OSA (obstructive sleep apnea) 06/02/2012  . Chest pain 05/06/2012  . Diastolic heart failure ([place]) 04/02/2012  . History of tobacco use 02/22/2012  . Osteoarthritis 02/22/2012  . History of TIA  (transient ischemic attack) 02/22/2012  . Obesity 07/27/2009  . INTERMITTENT CLAUDICATION 02/09/2009  . [place] DISEASE, LUMBAR SPINE 08/04/2008  . ESOPHAGEAL STRICTURE 05/20/2008  . Diabetes type 2, controlled ([place]) 02/23/2008  . HLD (hyperlipidemia) 02/23/2008  . Gout 02/23/2008  . HYPERTENSION, BENIGN ESSENTIAL 02/23/2008    Past Medical History: Past Medical History:  Diagnosis Date  . Asthma   . CAD (coronary artery disease)    Cardiac cath 2002 with 25% distal RCA stenosis.   . DDD (degenerative disc disease), lumbar   . Diabetes mellitus ([place])   . Esophageal stricture   . Gastritis 2010  . GERD (gastroesophageal reflux disease)   . Gout   . HTN (hypertension)   . Hyperlipidemia   . Insomnia   . MI (myocardial infarction) ([place]) 2009   no stent placement  . OA (osteoarthritis)   . Obesity   . OSA on CPAP   . Seasonal allergies   . TIA (transient ischemic attack) 2013    Past Surgical History: Past Surgical History:  Procedure Laterality Date  . ESOPHAGOGASTRODUODENOSCOPY     multiple  . NASAL SINUS SURGERY    . VASECTOMY      Social History: Social History   Tobacco Use  . Smoking status: Former Smoker    Packs/day: 1.50    Years: 10.00    Pack years: 15.00    Types: Cigarettes    Last attempt to quit: 11/06/1991    Years since quitting: 25.8  . Smokeless tobacco: Never Used  . Tobacco comment: quit 1993  Substance Use Topics  . Alcohol use: No  . Drug use: No   Additional social history: Lives at home alone. Quit smoking 30 years ago.   Please also refer to relevant sections of EMR.  Family History: Family History  Problem Relation Age of Onset  . Heart attack Mother   . Heart failure Mother   . Colon cancer Neg Hx   . Esophageal cancer Neg Hx   . Rectal cancer Neg Hx   . Stomach cancer Neg Hx     Allergies and Medications: Allergies  Allergen Reactions  . Atorvastatin Other (See Comments)    Subjective myalgias. Gave 2  trials, developed myalgias which resolved after cessation of medication.  Muscle cramps were 9/10 discomfort  . Codeine Nausea Only  . Shellfish Allergy Other (See Comments)    Activates asthma   No current facility-administered medications on file prior to encounter.    Current Outpatient Medications on File Prior to Encounter  Medication Sig Dispense Refill  . allopurinol (ZYLOPRIM) 100 MG tablet Take 1 tablet (100 mg total) by mouth daily. 30 tablet 2  . amLODipine (NORVASC) 10 MG tablet Take 1 tablet (10 mg total) by mouth daily. 90 tablet 3  . aspirin 81 MG chewable tablet Chew 1 tablet (81 mg total) by mouth daily. 90 tablet 3  . carvedilol (COREG) 25 MG tablet Take 1 tablet (25 mg total) by mouth 2 (two) times daily with a meal. (Patient taking differently: Take 25 mg by mouth daily. ) 180 tablet 3  . losartan (COZAAR) 100 MG tablet Take 1 tablet (100 mg total) by mouth daily. 90 tablet 0  . metFORMIN (GLUCOPHAGE) 500 MG  tablet Take 1 tablet (500 mg total) by mouth daily. 90 tablet 0  . omeprazole (PRILOSEC) 40 MG capsule TAKE ONE CAPSULE BY MOUTH ONCE DAILY 90 capsule 3  . simvastatin (ZOCOR) 20 MG tablet Take 1 tablet (20 mg total) by mouth at bedtime. 30 tablet 3  . ACCU-CHEK FASTCLIX LANCETS MISC 1 Device by Does not apply route daily. 102 each 3  . baclofen (LIORESAL) 10 MG tablet Take 1 tablet (10 mg total) by mouth 3 (three) times daily. (Patient not taking: Reported on 09/05/2017) 90 each 0  . benzonatate (TESSALON PERLES) 100 MG capsule Take 1 capsule (100 mg total) by mouth 3 (three) times daily as needed for cough. (Patient not taking: Reported on 09/05/2017) 20 capsule 0  . Blood Glucose Monitoring Suppl (ACCU-CHEK AVIVA CONNECT) W/DEVICE KIT 1 Device by Does not apply route daily. 1 kit 0  . cetirizine (ZYRTEC) 10 MG tablet Take 1 tablet (10 mg total) by mouth at bedtime. (Patient not taking: Reported on 09/05/2017) 90 tablet 3  . Cholecalciferol 1000 units capsule Take 1  capsule (1,000 Units total) by mouth daily. (Patient not taking: Reported on 09/05/2017) 90 capsule 2  . colchicine 0.6 MG tablet TAKE TWO TABLETS BY MOUTH TODAY THEN TAKE ONE TABLET BY MOUTH DAILY. (Patient not taking: Reported on 09/05/2017) 31 tablet 1  . dextromethorphan-guaiFENesin (MUCINEX DM) 30-600 MG 12hr tablet Take 1 tablet by mouth 2 (two) times daily. (Patient not taking: Reported on 09/05/2017) 30 tablet 0  . doxycycline (VIBRA-TABS) 100 MG tablet Take 1 tablet (100 mg total) by mouth 2 (two) times daily. (Patient not taking: Reported on 09/05/2017) 20 tablet 0  . fluticasone (FLONASE) 50 MCG/ACT nasal spray Place 1 spray into both nostrils daily. (Patient not taking: Reported on 09/05/2017) 16 g 2  . furosemide (LASIX) 40 MG tablet Take 1 tablet (40 mg total) by mouth daily. (Patient not taking: Reported on 09/05/2017) 30 tablet 11  . glucose blood test strip 1 each by Other route daily. Use as instructed 100 each 12  . Lancets (FREESTYLE) lancets Use as instructed 100 each 12  . naproxen (NAPROSYN) 250 MG tablet Take 2 tablets (500 mg total) by mouth 2 (two) times daily with a meal. (Patient not taking: Reported on 09/05/2017) 40 tablet 0  . triamcinolone ointment (KENALOG) 0.5 % APPLY ONE APPLICATION TOPICALLY TO AFFECTED AREA TWICE DAILY FOR THE NEXT WEEK (Patient not taking: Reported on 09/05/2017) 15 g 0    Objective: BP (!) 139/108   Pulse 74   Temp 99.4 F (37.4 C) (Oral)   Resp 14   SpO2 96%  Exam: General: Sitting up in bed comfortably, in no acute distress Eyes: PERRL, EOMI ENTM: Moist mucous membranes, tongue midline Neck: Supple, normal range of motion Cardiovascular: Regular rate and rhythm, normal S1-S2, no murmurs Respiratory: Clear to auscultation bilaterally, normal effort Gastrointestinal: Soft, nontender, nondistended, positive bowel sounds MSK: No lower extremity edema, moving all limbs equally Derm: Warm and dry, normal skin turgor Neuro: Alert and awake,  grossly normal without focal neuro deficits Psych: Appropriate affect  Labs and Imaging: CBC BMET  Recent Labs  Lab 09/05/17 1254  WBC 7.0  HGB 13.5  HCT 43.6  PLT 270   Recent Labs  Lab 09/05/17 1254  NA 140  K 4.1  CL 105  CO2 24  BUN 17  CREATININE 1.40*  GLUCOSE 107*  CALCIUM 9.4      Troponin (Point of Care Test) Recent Labs  09/05/17 1633  TROPIPOC 0.00   BNP    Component Value Date/Time   BNP 20.5 09/05/2017 1626   BNP 17.9 11/05/2014 1138   Lab Results  Component Value Date   CKTOTAL 514 (H) 09/05/2017   EKG: unchanged from previous tracings, 1st degree AV block.   Dg Chest 2 View  Result Date: 09/05/2017 CLINICAL DATA:  Body aches with nausea and dizziness. EXAM: CHEST - 2 VIEW COMPARISON:  Chest x-ray dated Aug 07, 2017. FINDINGS: The heart size and mediastinal contours are within normal limits. Normal pulmonary vascularity. No focal consolidation, pleural effusion, or pneumothorax. No acute osseous abnormality. IMPRESSION: No active cardiopulmonary disease. Electronically Signed   By: Titus Dubin M.D.   On: 09/05/2017 16:42    Bufford Lope, DO 09/05/2017, 6:56 PM PGY-2, [place] [facility] pager: [phone], text pages welcome

## 2017-09-05 NOTE — ED Notes (Signed)
EDP at bedside  

## 2017-09-05 NOTE — ED Provider Notes (Signed)
[facility] EMERGENCY DEPARTMENT Provider Note   CSN: [id number] Arrival date & time: 09/05/17  1219     History   Chief Complaint Chief Complaint  Patient presents with  . Weakness    HPI Alex [name] is a 62 y.o. male with a history of CAD (prior MI in 2009), diabetes, hypertension, hyperlipidemia, TIA (2013), heart failure who presents emergency department today for presyncope.  Patient reports he awoke this morning he did not eat or drink anything.  He went for a long walk and when he returned he went to the car store picked up parts for his car.  He went outside and worked on his car in the heat and started to feel as though he was getting dehydrated.  He notes that he began feeling generalized weakness, lightheadedness, nausea, hypersalivation as well as the sensation he was going to pass out.  He denies any syncope.  No head trauma or recent falls.  Patient reports that he went inside and drink a large glass of cold water as well as eat soup that helped him with his symptoms.  He notes that he is feeling much better but still feels lightheaded upon standing.  He denies any vertigo.  Patient notes that prior to eating he did check his CBG which was 113.  Patient denies any sudden onset of headache, visual changes, facial droop, difficulty with speech, changes in hearing, tinnitus, numbness/tingling/weakness of the upper or lower extremities, focal weakness, shortness of breath, cough, hemoptysis, abdominal pain, flank pain, urinary symptoms, lower extremity swelling.  He denies any recent illnesses. Patient does report some left lower chest pain when walking this morning. He notes this was not associated with sob, n/v, diaphoresis. He states it has resolved and not reoccurred. His last echocardiogram was on 11/08/2014 which showed a EF of 55-60% with G1 DD.  HPI  Past Medical History:  Diagnosis Date  . Asthma   . CAD (coronary artery disease)    Cardiac cath 2002  with 25% distal RCA stenosis.   . DDD (degenerative disc disease), lumbar   . Diabetes mellitus ([place])   . Esophageal stricture   . Gastritis 2010  . GERD (gastroesophageal reflux disease)   . Gout   . HTN (hypertension)   . Hyperlipidemia   . Insomnia   . MI (myocardial infarction) ([place]) 2009   no stent placement  . OA (osteoarthritis)   . Obesity   . OSA on CPAP   . Seasonal allergies   . TIA (transient ischemic attack) 2013    Patient Active Problem List   Diagnosis Date Noted  . Ingrown toenail of left foot with infection 11/02/2016  . Screen for STD (sexually transmitted disease) 08/14/2016  . Fatigue 11/12/2014  . Vision changes 07/23/2014  . Right foot strain 05/14/2014  . GERD (gastroesophageal reflux disease) 12/17/2013  . Seasonal allergies 06/15/2013  . Dyspnea 05/16/2013  . Headache 12/03/2012  . Rash and nonspecific skin eruption 10/07/2012  . Internal hemorrhoids with other complication 38/88/2800  . Coronary artery disease 07/14/2012  . OSA (obstructive sleep apnea) 06/02/2012  . Chest pain 05/06/2012  . Diastolic heart failure ([place]) 04/02/2012  . History of tobacco use 02/22/2012  . Osteoarthritis 02/22/2012  . History of TIA (transient ischemic attack) 02/22/2012  . Obesity 07/27/2009  . INTERMITTENT CLAUDICATION 02/09/2009  . Martin DISEASE, LUMBAR SPINE 08/04/2008  . ESOPHAGEAL STRICTURE 05/20/2008  . Diabetes type 2, controlled ([place]) 02/23/2008  . HLD (hyperlipidemia) 02/23/2008  .  Gout 02/23/2008  . HYPERTENSION, BENIGN ESSENTIAL 02/23/2008    Past Surgical History:  Procedure Laterality Date  . ESOPHAGOGASTRODUODENOSCOPY     multiple  . NASAL SINUS SURGERY    . VASECTOMY          Home Medications    Prior to Admission medications   Medication Sig Start Date End Date Taking? Authorizing Provider  ACCU-CHEK FASTCLIX LANCETS MISC 1 Device by Does not apply route daily. 01/26/14   Leone Haven, MD  albuterol (PROVENTIL  HFA;VENTOLIN HFA) 108 (90 BASE) MCG/ACT inhaler Inhale 2 puffs into the lungs every 6 (six) hours as needed for wheezing or shortness of breath.    [provider]  allopurinol (ZYLOPRIM) 100 MG tablet Take 1 tablet (100 mg total) by mouth daily. 05/17/16   Vivi Barrack, MD  amLODipine (NORVASC) 10 MG tablet Take 1 tablet (10 mg total) by mouth daily. 05/09/17   Marjie Skiff, MD  aspirin 81 MG chewable tablet Chew 1 tablet (81 mg total) by mouth daily. 09/24/13   Leone Haven, MD  baclofen (LIORESAL) 10 MG tablet Take 1 tablet (10 mg total) by mouth 3 (three) times daily. 05/31/16   Katheren Shams, DO  benzonatate (TESSALON PERLES) 100 MG capsule Take 1 capsule (100 mg total) by mouth 3 (three) times daily as needed for cough. 04/24/17   Eloise Levels, MD  Blood Glucose Monitoring Suppl (ACCU-CHEK AVIVA CONNECT) W/DEVICE KIT 1 Device by Does not apply route daily. 12/01/13   Leone Haven, MD  carvedilol (COREG) 25 MG tablet Take 1 tablet (25 mg total) by mouth 2 (two) times daily with a meal. 07/29/17   Diallo, Abdoulaye, MD  cetirizine (ZYRTEC) 10 MG tablet Take 1 tablet (10 mg total) by mouth at bedtime. 09/24/13   Leone Haven, MD  Cholecalciferol 1000 units capsule Take 1 capsule (1,000 Units total) by mouth daily. 10/04/15   Vivi Barrack, MD  colchicine 0.6 MG tablet TAKE TWO TABLETS BY MOUTH TODAY THEN TAKE ONE TABLET BY MOUTH DAILY. Patient taking differently: TAKE TWO TABLETS BY MOUTH TODAY THEN TAKE ONE TABLET BY MOUTH DAILY AS NEEDED FOR GOUT FLARE UP. 08/11/14   Leone Haven, MD  dextromethorphan-guaiFENesin [facility] DM) 30-600 MG 12hr tablet Take 1 tablet by mouth 2 (two) times daily. 01/13/16   Eloise Levels, MD  doxycycline (VIBRA-TABS) 100 MG tablet Take 1 tablet (100 mg total) by mouth 2 (two) times daily. 11/01/16   Mayo, Pete Pelt, MD  fluticasone (FLONASE) 50 MCG/ACT nasal spray Place 1 spray into both nostrils daily. Patient taking differently:  Place 1 spray into both nostrils daily as needed for allergies.  05/14/13   Leone Haven, MD  furosemide (LASIX) 40 MG tablet Take 1 tablet (40 mg total) by mouth daily. 08/19/14   Burnell Blanks, MD  glucose blood test strip 1 each by Other route daily. Use as instructed 01/26/14   Leone Haven, MD  Lancets (FREESTYLE) lancets Use as instructed 11/27/13   Lupita Dawn, MD  losartan (COZAAR) 100 MG tablet Take 1 tablet (100 mg total) by mouth daily. 08/08/17   Smiley Houseman, MD  metFORMIN (GLUCOPHAGE) 500 MG tablet Take 1 tablet (500 mg total) by mouth daily. 02/07/17   Diallo, Earna Coder, MD  naproxen (NAPROSYN) 250 MG tablet Take 2 tablets (500 mg total) by mouth 2 (two) times daily with a meal. 02/14/17   Diallo, Earna Coder, MD  omeprazole (PRILOSEC) 40  MG capsule TAKE ONE CAPSULE BY MOUTH ONCE DAILY 11/02/16   Diallo, Earna Coder, MD  simvastatin (ZOCOR) 20 MG tablet Take 1 tablet (20 mg total) by mouth at bedtime. 07/12/15   Vivi Barrack, MD  triamcinolone ointment (KENALOG) 0.5 % APPLY ONE APPLICATION TOPICALLY TO AFFECTED AREA TWICE DAILY FOR THE NEXT WEEK 12/07/16   Marjie Skiff, MD    Family History Family History  Problem Relation Age of Onset  . Heart attack Mother   . Heart failure Mother   . Colon cancer Neg Hx   . Esophageal cancer Neg Hx   . Rectal cancer Neg Hx   . Stomach cancer Neg Hx     Social History Social History   Tobacco Use  . Smoking status: Former Smoker    Packs/day: 1.50    Years: 10.00    Pack years: 15.00    Types: Cigarettes    Last attempt to quit: 11/06/1991    Years since quitting: 25.8  . Smokeless tobacco: Never Used  . Tobacco comment: quit 1993  Substance Use Topics  . Alcohol use: No  . Drug use: No     Allergies   Atorvastatin; Codeine; and Shellfish allergy   Review of Systems Review of Systems  All other systems reviewed and are negative.    Physical Exam Updated Vital Signs BP (!) 144/95   Pulse  67   Temp 99.4 F (37.4 C) (Oral)   Resp 12   SpO2 97%   Physical Exam  Constitutional: He appears well-developed and well-nourished.  HENT:  Head: Normocephalic and atraumatic.  Right Ear: External ear normal.  Left Ear: External ear normal.  Nose: Nose normal.  Mouth/Throat: Uvula is midline, oropharynx is clear and moist and mucous membranes are normal. No tonsillar exudate.  Eyes: Pupils are equal, round, and reactive to light. Right eye exhibits no discharge. Left eye exhibits no discharge. No scleral icterus.  Neck: Trachea normal. Neck supple. No spinous process tenderness present. No neck rigidity. Normal range of motion present.  Cardiovascular: Normal rate, regular rhythm and intact distal pulses.  No murmur heard. Pulses:      Radial pulses are 2+ on the right side, and 2+ on the left side.       Dorsalis pedis pulses are 2+ on the right side, and 2+ on the left side.       Posterior tibial pulses are 2+ on the right side, and 2+ on the left side.  No murmur auscultated.  No lower extremity swelling or edema. Calves symmetric in size bilaterally.  Pulmonary/Chest: Effort normal and breath sounds normal. He exhibits no tenderness.  Abdominal: Soft. Bowel sounds are normal. He exhibits no distension. There is no tenderness. There is no rigidity, no rebound, no guarding and no CVA tenderness.  Musculoskeletal: He exhibits no edema.  Lymphadenopathy:    He has no cervical adenopathy.  Neurological: He is alert.  Mental Status:  Alert, oriented, thought content appropriate, able to give a coherent history. Speech fluent without evidence of aphasia. Able to follow 2 step commands without difficulty.  Cranial Nerves:  II:  Peripheral visual fields grossly normal, pupils equal, round, reactive to light III,IV, VI: ptosis not present, extra-ocular motions intact bilaterally  V,VII: smile symmetric, eyebrows raise symmetric, facial light touch sensation equal VIII: hearing grossly  normal to voice  X: uvula elevates symmetrically  XI: bilateral shoulder shrug symmetric and strong XII: midline tongue extension without fassiculations Motor:  Normal tone. 5/5 in  upper and lower extremities bilaterally including strong and equal grip strength and dorsiflexion/plantar flexion Sensory: Sensation intact to light touch in all extremities. Negative Romberg.  Deep Tendon Reflexes: 2+ and symmetric in the biceps and patella Cerebellar: normal finger-to-nose with bilateral upper extremities. Normal heel-to -shin balance bilaterally of the lower extremity. No pronator drift.  Gait: normal gait and balance -patient did report that he felt lightheaded when standing perform Romberg and gait. CV: distal pulses palpable throughout   Skin: Skin is warm and dry. No rash noted. He is not diaphoretic.  Psychiatric: He has a normal mood and affect.  Nursing note and vitals reviewed.    ED Treatments / Results  Labs (all labs ordered are listed, but only abnormal results are displayed) Labs Reviewed  BASIC METABOLIC PANEL - Abnormal; Notable for the following components:      Result Value   Glucose, Bld 107 (*)    Creatinine, Ser 1.40 (*)    GFR calc non Af Amer 52 (*)    All other components within normal limits  CBC - Abnormal; Notable for the following components:   MCH 25.3 (*)    All other components within normal limits  URINALYSIS, ROUTINE W REFLEX MICROSCOPIC - Abnormal; Notable for the following components:   Hgb urine dipstick MODERATE (*)    All other components within normal limits  CK - Abnormal; Notable for the following components:   Total CK 514 (*)    All other components within normal limits  BRAIN NATRIURETIC PEPTIDE  CBG MONITORING, ED  I-STAT TROPONIN, ED    EKG None  Radiology Dg Chest 2 View  Result Date: 09/05/2017 CLINICAL DATA:  Body aches with nausea and dizziness. EXAM: CHEST - 2 VIEW COMPARISON:  Chest x-ray dated Aug 07, 2017. FINDINGS: The  heart size and mediastinal contours are within normal limits. Normal pulmonary vascularity. No focal consolidation, pleural effusion, or pneumothorax. No acute osseous abnormality. IMPRESSION: No active cardiopulmonary disease. Electronically Signed   By: Titus Dubin M.D.   On: 09/05/2017 16:42    Procedures Procedures (including critical care time)  Medications Ordered in ED Medications  sodium chloride 0.9 % bolus 500 mL (0 mLs Intravenous Stopped 09/05/17 1741)     Initial Impression / Assessment and Plan / ED Course  I have reviewed the triage vital signs and the nursing notes.  Pertinent labs & imaging results that were available during my care of the patient were reviewed by me and considered in my medical decision making (see chart for details).     62 year old male with a history of CAD, diabetes, hypertension, hyperlipidemia, TIA, heart failure (11/08/2014 which showed a EF of 55-60% with G1 DD) presenting to the emergent department today for presyncopal episode.  He also did note some chest pain earlier today. History as above.  Patient's vital signs are reassuring on presentation.  Heart is regular rate and rhythm.  No murmur auscultated.  Lungs are clear to auscultation bilaterally.  Abdomen is soft and nontender.  Patient with normal neurologic exam.  Patient denies any neurologic symptoms.  Orthostatics are negative.  Patient given 500 cc bolus of fluid with improvement of symptoms.  EKG with sinus rhythm with a first-degree AV block with PR interval at 212.  Chest x-ray without any active cardiopulmonary disease.  BNP within normal limits.  Troponin within normal limits.  CBG without hypoglycemia.  No significant electrolyte derangements.  No anion gap acidosis.  No leukocytosis.  No anemia.  Creatinine  is elevated from patient's baseline of 1.08-1.15 to now 1.4.  BUN within normal limits.  CK is elevated but not consistent with rhabdomyolysis.  Patient with presyncope. He  has chronically elevated CK. Unsure cause. He is on a statin. Did report chest pain this morning. Chest pain workup had been reassuring.  He does have a history of heart failure.  Patient is high risk.  Will likely need admission.  Discussed case with my attending Dr. Eulis Foster who is in agreement. I discussed case with family medicine service who has agreed to admit the patient. Patient is in agreement with plan. He appears safe for admission.   Orthostatic VS for the past 24 hrs:  BP- Lying Pulse- Lying BP- Sitting Pulse- Sitting BP- Standing at 0 minutes Pulse- Standing at 0 minutes  09/05/17 1653 (!) 126/94 61 (!) 142/99 71 (!) 140/106 83    Final Clinical Impressions(s) / ED Diagnoses   Final diagnoses:  Pre-syncope    ED Discharge Orders    None       Lorelle Gibbs 09/06/17 0119    Daleen Bo, MD 09/06/17 1659

## 2017-09-05 NOTE — ED Notes (Signed)
Patient transported to X-ray 

## 2017-09-05 NOTE — ED Provider Notes (Signed)
  Face-to-face evaluation   History: He presents for evaluation of dizziness, with an ill-ease feeling, following working outside and walking.  He has had some occasional chest discomfort today which he describes as mild in the left lower anterior chest.  No prior cardiac history.  Physical exam: Obese, alert, cooperative.  No respiratory distress.  Heart regular rate and rhythm without murmur lungs clear to auscultation.  Chest nontender to palpation.  Abdomen soft and nontender.  No dysarthria or a aphasia.  MDM-chronically elevated CK, now with renal insufficiency.  Screening for cardiac disease is reassuring.  Doubt ACS or PE.  Medical screening examination/treatment/procedure(s) were conducted as a shared visit with non-physician practitioner(s) and myself.  I personally evaluated the patient during the encounter    Daleen Bo, MD 09/06/17 1659

## 2017-09-05 NOTE — ED Notes (Signed)
Pt states he hasn't taken his BP medicine today cause he usually takes it at lunch time and he was here today at that time.

## 2017-09-05 NOTE — ED Triage Notes (Signed)
Patient complains of dizziness and weakness that started at 1030 earlier today. Patient alert, oriented, ambulating independently with steady gait. No vision deficits, no slurred speech, no facial droop, no arm drift, grip strength equal bilaterally.

## 2017-09-06 ENCOUNTER — Observation Stay (HOSPITAL_BASED_OUTPATIENT_CLINIC_OR_DEPARTMENT_OTHER): Payer: PPO

## 2017-09-06 DIAGNOSIS — I2510 Atherosclerotic heart disease of native coronary artery without angina pectoris: Secondary | ICD-10-CM | POA: Diagnosis not present

## 2017-09-06 DIAGNOSIS — N179 Acute kidney failure, unspecified: Secondary | ICD-10-CM | POA: Diagnosis not present

## 2017-09-06 DIAGNOSIS — Z8673 Personal history of transient ischemic attack (TIA), and cerebral infarction without residual deficits: Secondary | ICD-10-CM | POA: Diagnosis not present

## 2017-09-06 DIAGNOSIS — E785 Hyperlipidemia, unspecified: Secondary | ICD-10-CM | POA: Diagnosis not present

## 2017-09-06 DIAGNOSIS — R55 Syncope and collapse: Secondary | ICD-10-CM | POA: Diagnosis not present

## 2017-09-06 DIAGNOSIS — K219 Gastro-esophageal reflux disease without esophagitis: Secondary | ICD-10-CM | POA: Diagnosis not present

## 2017-09-06 DIAGNOSIS — E669 Obesity, unspecified: Secondary | ICD-10-CM | POA: Diagnosis not present

## 2017-09-06 DIAGNOSIS — E119 Type 2 diabetes mellitus without complications: Secondary | ICD-10-CM | POA: Diagnosis not present

## 2017-09-06 DIAGNOSIS — R531 Weakness: Secondary | ICD-10-CM | POA: Diagnosis not present

## 2017-09-06 DIAGNOSIS — E860 Dehydration: Secondary | ICD-10-CM | POA: Diagnosis not present

## 2017-09-06 DIAGNOSIS — R0789 Other chest pain: Secondary | ICD-10-CM | POA: Diagnosis not present

## 2017-09-06 DIAGNOSIS — R079 Chest pain, unspecified: Secondary | ICD-10-CM | POA: Diagnosis not present

## 2017-09-06 DIAGNOSIS — I10 Essential (primary) hypertension: Secondary | ICD-10-CM | POA: Diagnosis not present

## 2017-09-06 DIAGNOSIS — I119 Hypertensive heart disease without heart failure: Secondary | ICD-10-CM | POA: Diagnosis not present

## 2017-09-06 DIAGNOSIS — G4733 Obstructive sleep apnea (adult) (pediatric): Secondary | ICD-10-CM | POA: Diagnosis not present

## 2017-09-06 DIAGNOSIS — I25810 Atherosclerosis of coronary artery bypass graft(s) without angina pectoris: Secondary | ICD-10-CM | POA: Diagnosis not present

## 2017-09-06 LAB — LIPID PANEL
Cholesterol: 219 mg/dL — ABNORMAL HIGH (ref 0–200)
HDL: 26 mg/dL — ABNORMAL LOW (ref >40)
LDL Cholesterol: 154 mg/dL — ABNORMAL HIGH (ref 0–99)
Total CHOL/HDL Ratio: 8.4 RATIO
Triglycerides: 193 mg/dL — ABNORMAL HIGH (ref <150)
VLDL: 39 mg/dL (ref 0–40)

## 2017-09-06 LAB — CBC
HCT: 39.2 % (ref 39.0–52.0)
Hemoglobin: 12.3 g/dL — ABNORMAL LOW (ref 13.0–17.0)
MCH: 25.4 pg — ABNORMAL LOW (ref 26.0–34.0)
MCHC: 31.4 g/dL (ref 30.0–36.0)
MCV: 80.8 fL (ref 78.0–100.0)
Platelets: 273 10*3/uL (ref 150–400)
RBC: 4.85 MIL/uL (ref 4.22–5.81)
RDW: 15.3 % (ref 11.5–15.5)
WBC: 7 10*3/uL (ref 4.0–10.5)

## 2017-09-06 LAB — BASIC METABOLIC PANEL
Anion gap: 10 (ref 5–15)
BUN: 11 mg/dL (ref 8–23)
CO2: 23 mmol/L (ref 22–32)
Calcium: 9.1 mg/dL (ref 8.9–10.3)
Chloride: 107 mmol/L (ref 98–111)
Creatinine, Ser: 1.11 mg/dL (ref 0.61–1.24)
GFR calc Af Amer: >60 mL/min (ref >60)
GFR calc non Af Amer: >60 mL/min (ref >60)
Glucose, Bld: 97 mg/dL (ref 70–99)
Potassium: 3.7 mmol/L (ref 3.5–5.1)
Sodium: 140 mmol/L (ref 135–145)

## 2017-09-06 LAB — ECHOCARDIOGRAM COMPLETE
Height: 67 in
Weight: 4960 oz

## 2017-09-06 LAB — HEMOGLOBIN A1C
Hgb A1c MFr Bld: 6.4 % — ABNORMAL HIGH (ref 4.8–5.6)
Mean Plasma Glucose: 136.98 mg/dL

## 2017-09-06 LAB — HIV ANTIBODY (ROUTINE TESTING W REFLEX): HIV Screen 4th Generation wRfx: NONREACTIVE

## 2017-09-06 MED ORDER — ROSUVASTATIN CALCIUM 10 MG PO TABS: 10.0000 mg | ORAL_TABLET | Freq: Every day | ORAL | 11 refills | Status: DC

## 2017-09-06 NOTE — Progress Notes (Signed)
Alex [name] to be D/C'd Home per MD order.  Discussed with the patient and all questions fully answered.  VSS, Skin clean, dry and intact without evidence of skin break down, no evidence of skin tears noted. IV catheter discontinued intact. Site without signs and symptoms of complications. Dressing and pressure applied.  An After Visit Summary was printed and given to the patient. Patient received prescription.  D/c education completed with patient/family including follow up instructions, medication list, d/c activities limitations if indicated, with other d/c instructions as indicated by MD - patient able to verbalize understanding, all questions fully answered.   Patient instructed to return to ED, call 911, or call MD for any changes in condition.   Patient escorted via [place], and D/C home via private auto.  Holley Raring 09/06/2017 3:24 PM

## 2017-09-06 NOTE — Discharge Instructions (Signed)
You were hospitalized due to concern for chest pain. You had a normal Troponin and a normal EKG that was NOT concerning for an myocardial infarction (heart attack). You were monitored overnight and your vitals remained stable. Please ensure you stay hydrated before, during, and after strenuous activity. Please avoid over exerting yourself during hot days and anytime during the hottest part of the day.

## 2017-09-06 NOTE — Progress Notes (Signed)
OT Note    09/06/17 1200  OT Visit Information  Last OT Received On 09/06/17  Reason Eval/Treat Not Completed OT screened, no needs identified, will sign off  Maurie Boettcher, OT/L  OT Clinical Specialist [phone]

## 2017-09-06 NOTE — Progress Notes (Signed)
  Echocardiogram 2D Echocardiogram has been performed.  Technically difficult study due to patient positioning.   Xaine Sansom L Androw 09/06/2017, 11:59 AM

## 2017-09-06 NOTE — Progress Notes (Signed)
Pt reported he had a spider bite about a week ago to his right thigh. He wanted the MDs to be aware. Resident MD made aware and she advised that they would look into it in the morning during the rounds. Will continue to monitor.

## 2017-09-06 NOTE — Evaluation (Addendum)
Physical Therapy Evaluation and Discharge Patient Details Name: Alex [name] MRN: [id number] DOB: 29-Feb-1956 Today's Date: 09/06/2017   History of Present Illness  Pt is a 62 y.o. M with significant PMH of DM, HTN, HLD, CAD with h/o MI, OSA, TIA.   Clinical Impression  Patient evaluated by Physical Therapy with no further acute PT needs identified. Patient ambulating in hallway independently including walking around/over obstacles. No complaints of dizziness. SpO2 93% on RA, BP 154/111 following mobility (RN notified). All education has been completed and the patient has no further questions. No follow-up Physical Therapy or equipment needs. PT is signing off. Thank you for this referral.     Follow Up Recommendations No PT follow up    Equipment Recommendations  None recommended by PT    Recommendations for Other Services       Precautions / Restrictions Precautions Precautions: None Restrictions Weight Bearing Restrictions: No      Mobility  Bed Mobility Overal bed mobility: Independent                Transfers Overall transfer level: Independent                  Ambulation/Gait Ambulation/Gait assistance: Independent Gait Distance (Feet): 400 Feet Assistive device: None Gait Pattern/deviations: WFL(Within Functional Limits);Wide base of support     General Gait Details: wider BOS due to obesity but overall independent  Stairs            Wheelchair Mobility    Modified Rankin (Stroke Patients Only)       Balance Overall balance assessment: No apparent balance deficits (not formally assessed)                                           Pertinent Vitals/Pain Pain Assessment: No/denies pain    Home Living Family/patient expects to be discharged to:: Private residence Living Arrangements: Alone Available Help at Discharge: Friend(s) Type of Home: Mobile home Home Access: Stairs to enter Entrance Stairs-Rails:  Psychiatric nurse of Steps: 4 Home Layout: One level Home Equipment: None      Prior Function Level of Independence: Independent         Comments: 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 enjoys playing the keyboard and guitar at [employer]        Extremity/Trunk Assessment   Upper Extremity Assessment Upper Extremity Assessment: Overall WFL for tasks assessed    Lower Extremity Assessment Lower Extremity Assessment: Overall WFL for tasks assessed    Cervical / Trunk Assessment Cervical / Trunk Assessment: Normal  Communication   Communication: No difficulties  Cognition Arousal/Alertness: Awake/alert Behavior During Therapy: WFL for tasks assessed/performed Overall Cognitive Status: Within Functional Limits for tasks assessed                                        General Comments      Exercises     Assessment/Plan    PT Assessment Patent does not need any further PT services  PT Problem List         PT Treatment Interventions      PT Goals (Current goals can be found in the Care Plan section)  Acute Rehab PT Goals Patient Stated Goal:  go home PT Goal Formulation: All assessment and education complete, DC therapy    Frequency     Barriers to discharge        Co-evaluation               AM-PAC PT "6 Clicks" Daily Activity  Outcome Measure Difficulty turning over in bed (including adjusting bedclothes, sheets and blankets)?: None Difficulty moving from lying on back to sitting on the side of the bed? : None Difficulty sitting down on and standing up from a chair with arms (e.g., wheelchair, bedside commode, etc,.)?: None Help needed moving to and from a bed to chair (including a wheelchair)?: None Help needed walking in hospital room?: None Help needed climbing 3-5 steps with a railing? : None 6 Click Score: 24    End of Session   Activity Tolerance: Patient tolerated  treatment well Patient left: with call bell/phone within reach;Other (comment)(standing in room (pt is independent)) Nurse Communication: Mobility status PT Visit Diagnosis: Dizziness and giddiness (R42);Difficulty in walking, not elsewhere classified (R26.2)    Time: 3662-9476 PT Time Calculation (min) (ACUTE ONLY): 10 min   Charges:   PT Evaluation $PT Eval Low Complexity: 1 Low     PT G Codes:        Ellamae Sia, PT, DPT Acute Rehabilitation Services  Pager: [place] 09/06/2017, 10:33 AM

## 2017-09-08 NOTE — Discharge Summary (Signed)
[facility] Discharge Summary  Patient name: Alex [name] Medical record number: [id number] Date of birth: 03/16/1955 Age: 62 y.o. Gender: male Date of Admission: 09/05/2017  Date of Discharge: 09/06/2017 Admitting Physician: Alex Resides, MD  Primary Care Provider: Marjie Skiff, MD Consultants: None  Indication for Hospitalization:   Atypical Chest Pain Presyncope  Discharge Diagnoses/Problem List:   Atypical Chest Pain Presyncope HTN HLD OSA  Disposition: home  Discharge Condition: medically stable  Discharge Exam:   Gen: Alert and Oriented x 3, NAD HEENT: Normocephalic, atraumatic, PERRLA, EOMI CV: RRR, no murmurs, normal S1, S2 split, +2 pulses dorsalis pedis bilaterally Resp: CTAB, no wheezing, rales, or rhonchi, comfortable work of breathing Abd: obese, non-distended, non-tender, soft, +bs in all four quadrants MSK: Moves all four extremities Ext: no clubbing, cyanosis, or edema Skin: warm, dry, intact, no rashes  Brief Hospital Course:  Mr Alex [name] is a 62y/o with PMH is significant forDM, HTN, HLD, CAD with h/o MI, OSA, h/o TIA presenting with chest pain, dizziness, lightheadedness after working on his car in the heat of the day. He states his pain was intermittent, substernal to the left side and non-radiating. He went inside and his symptoms subsided. He never lost consciousness. He did not fall or have any head injury. He was admitted for ACS rule out which resulted in a negative work up (normal CXR, negative Troponin, unchanged EKG, with resolution of symptoms.) It was determined his symptoms were partially caused by overexertion, poor hydration, and heat exhaustion. He was encouraged to stay hydrated and limit activity especially in the heat of the day.  He was medically stable for discharge home with follow up in the clinic.  Issues for Follow Up:  1. Patient had an echocardiogram that showed lower end of normal  LVEF with grade 1 diastolic dysfunction. Discuss with patient weight loss and exercise. 2. Patient had risk stratification labs; his LDL is high and his HDL is low. Obtain follow up lipid panel as patient was placed on rosuvastatin. Consider increasing his rosuvastatin if no improvement. 3. Patient's CK was elevated on admission and appears to be chronically elevated at baseline. Continue to monitor for symptoms of myopathy.  Significant Procedures:   None  Significant Labs and Imaging:  Recent Labs  Lab 09/05/17 1254 09/06/17 0512  WBC 7.0 7.0  HGB 13.5 12.3*  HCT 43.6 39.2  PLT 270 273   Recent Labs  Lab 09/05/17 1254 09/06/17 0512  NA 140 140  K 4.1 3.7  CL 105 107  CO2 24 23  GLUCOSE 107* 97  BUN 17 11  CREATININE 1.40* 1.11  CALCIUM 9.4 9.1   HgbA1c: 6.4 Lipid Panel: Tot. Chol. 219, HDL 26, LDL 154 Troponin I: <0.03 CK: 514 BNP: 20.5 U/A: negative  CXR IMPRESSION: No active cardiopulmonary disease.  ECHO Study Conclusions: Left ventricle: The cavity size was normal. Systolic function was at the lower limits of normal. The estimated ejection fraction was in the range of 50% to 55%. Wall motion was normal; there were no regional wall motion abnormalities. Doppler parameters are consistent with abnormal left ventricular relaxation (grade 1 diastolic dysfunction). Left atrium: The atrium was mildly dilated.  Results/Tests Pending at Time of Discharge:   None  Discharge Medications:  Allergies as of 09/06/2017      Reactions   Atorvastatin Other (See Comments)   Subjective myalgias. Gave 2 trials, developed myalgias which resolved after cessation of medication.  Muscle cramps were 9/10 discomfort  Codeine Nausea Only   Shellfish Allergy Other (See Comments)   Activates asthma      Medication List    STOP taking these medications   baclofen 10 MG tablet Commonly known as:  LIORESAL   benzonatate 100 MG capsule Commonly known as:  TESSALON PERLES    colchicine 0.6 MG tablet   dextromethorphan-guaiFENesin 30-600 MG 12hr tablet Commonly known as:  MUCINEX DM   doxycycline 100 MG tablet Commonly known as:  VIBRA-TABS   furosemide 40 MG tablet Commonly known as:  LASIX   naproxen 250 MG tablet Commonly known as:  NAPROSYN   simvastatin 20 MG tablet Commonly known as:  ZOCOR   triamcinolone ointment 0.5 % Commonly known as:  KENALOG     TAKE these medications   ACCU-CHEK AVIVA CONNECT w/Device Kit 1 Device by Does not apply route daily.   allopurinol 100 MG tablet Commonly known as:  ZYLOPRIM Take 1 tablet (100 mg total) by mouth daily.   amLODipine 10 MG tablet Commonly known as:  NORVASC Take 1 tablet (10 mg total) by mouth daily.   aspirin 81 MG chewable tablet Chew 1 tablet (81 mg total) by mouth daily.   carvedilol 25 MG tablet Commonly known as:  COREG Take 1 tablet (25 mg total) by mouth 2 (two) times daily with a meal. What changed:  when to take this   cetirizine 10 MG tablet Commonly known as:  ZYRTEC Take 1 tablet (10 mg total) by mouth at bedtime.   Cholecalciferol 1000 units capsule Take 1 capsule (1,000 Units total) by mouth daily.   fluticasone 50 MCG/ACT nasal spray Commonly known as:  FLONASE Place 1 spray into both nostrils daily.   freestyle lancets Use as instructed   ACCU-CHEK FASTCLIX LANCETS Misc 1 Device by Does not apply route daily.   glucose blood test strip 1 each by Other route daily. Use as instructed   losartan 100 MG tablet Commonly known as:  COZAAR Take 1 tablet (100 mg total) by mouth daily.   metFORMIN 500 MG tablet Commonly known as:  GLUCOPHAGE Take 1 tablet (500 mg total) by mouth daily.   omeprazole 40 MG capsule Commonly known as:  PRILOSEC TAKE ONE CAPSULE BY MOUTH ONCE DAILY   rosuvastatin 10 MG tablet Commonly known as:  CRESTOR Take 1 tablet (10 mg total) by mouth at bedtime.       Discharge Instructions: Please refer to Patient Instructions  section of EMR for full details.  Patient was counseled important signs and symptoms that should prompt return to medical care, changes in medications, dietary instructions, activity restrictions, and follow up appointments.   Follow-Up Appointments: Follow-up Information    Alex Hilding, MD. Go on 09/16/2017.   Specialty:  Family Medicine Why:  Please arrive 15 minutes early for your 9:30am appointment Contact information: [place] [address] [phone]           Nuala Alpha, DO 09/08/2017, 2:02 PM PGY-1, [place]

## 2017-09-16 ENCOUNTER — Inpatient Hospital Stay: Payer: PPO | Admitting: Family Medicine

## 2017-09-18 ENCOUNTER — Other Ambulatory Visit: Payer: Self-pay | Admitting: Family Medicine

## 2017-09-18 ENCOUNTER — Telehealth: Payer: Self-pay

## 2017-09-18 MED ORDER — COLCHICINE 0.6 MG PO TABS: 0.6000 mg | ORAL_TABLET | Freq: Every day | ORAL | 0 refills | Status: DC

## 2017-09-18 MED ORDER — ALLOPURINOL 100 MG PO TABS: 100.0000 mg | ORAL_TABLET | Freq: Every day | ORAL | 2 refills | Status: DC

## 2017-09-18 NOTE — Addendum Note (Signed)
Addended by: Dorna Bloom on: 09/18/2017 04:02 PM   Modules accepted: Orders

## 2017-09-18 NOTE — Addendum Note (Signed)
Addended by: Marjie Skiff F on: 09/18/2017 05:18 PM   Modules accepted: Orders

## 2017-09-18 NOTE — Telephone Encounter (Signed)
Per PCP, I informed pt allopurinol and colchicine will be called in to his pharmacy and I gave specific instructions on usage. Take (2) 0.6mg  colchicine this evening. Take (2) colchicine 0.6mg  tomorrow morning and ONE in the evening. Friday start allopurinol. Pt repeated instructions back to me and wrote them down.

## 2017-09-18 NOTE — Telephone Encounter (Signed)
Pt called nurse line requesting a refill on his gout medication. Pt states his allopurinol does not really work for him and he is requesting the "blue pill?" Pt could not give me anymore information than that. If unable to refill the "blue pill," he is ok with allopurinol. Please advise.

## 2017-10-15 ENCOUNTER — Ambulatory Visit: Payer: PPO | Admitting: Family Medicine

## 2017-10-25 ENCOUNTER — Ambulatory Visit (INDEPENDENT_AMBULATORY_CARE_PROVIDER_SITE_OTHER): Payer: PPO | Admitting: Family Medicine

## 2017-10-25 ENCOUNTER — Other Ambulatory Visit: Payer: Self-pay

## 2017-10-25 ENCOUNTER — Encounter: Payer: Self-pay | Admitting: Family Medicine

## 2017-10-25 ENCOUNTER — Telehealth: Payer: Self-pay | Admitting: *Deleted

## 2017-10-25 VITALS — BP 142/108 | HR 69 | Temp 98.0°F | Wt 315.4 lb

## 2017-10-25 DIAGNOSIS — I10 Essential (primary) hypertension: Secondary | ICD-10-CM | POA: Diagnosis not present

## 2017-10-25 DIAGNOSIS — E119 Type 2 diabetes mellitus without complications: Secondary | ICD-10-CM | POA: Diagnosis not present

## 2017-10-25 LAB — POCT GLYCOSYLATED HEMOGLOBIN (HGB A1C): HbA1c, POC (controlled diabetic range): 5.8 % (ref 0.0–7.0)

## 2017-10-25 NOTE — Assessment & Plan Note (Signed)
A1c is 5.8 down from 6.4 without taking metformin. Discuss not resuming and continuing with therapeutic lifestyle changes. Handout on Diabetic diet given. Will follow up in 3 months.

## 2017-10-25 NOTE — Patient Instructions (Addendum)
It was great seeing you today! We have addressed the following issues today  1. Good job on your Diabetes your A1c is 5.8 down from 6.4. You can hold off on the metformin since you have not been taking. Continue to work on [employer] and exercise.  2. You need to make sure you take your cholesterol medication CRESTOR. 3. Continue with your blood pressure medications amlodipine, losartan and carvedilol as prescribed.  If we did any lab work today, and the results require attention, either me or my nurse will get in touch with you. If everything is normal, you will get a letter in mail and a message via . If you don't hear from [place] in two weeks, please give [place] a call. Otherwise, we look forward to seeing you again at your next visit. If you have any questions or concerns before then, please call the clinic at [phone].  Please bring all your medications to every doctors visit  Sign up for My Chart to have easy access to your labs results, and communication with your Primary care physician. Please ask Front Desk for some assistance.   Please check-out at the front desk before leaving the clinic.    Take Care,   Dr. Andy Gauss   Diet Recommendations for Diabetes   Starchy (carb) foods: Bread, rice, pasta, potatoes, corn, cereal, grits, crackers, bagels, muffins, all baked goods.  (Fruits, milk, and yogurt also have carbohydrate, but most of these foods will not spike your blood sugar as the starchy foods will.)  A few fruits do cause high blood sugars; use small portions of bananas (limit to 1/2 at a time), grapes, watermelon, oranges, and most tropical fruits.    Protein foods: Meat, fish, poultry, eggs, dairy foods, and beans such as pinto and kidney beans (beans also provide carbohydrate).   1. Eat at least 3 meals and 1-2 snacks per day. Never go more than 4-5 hours while awake without eating. Eat breakfast within the first hour of getting up.   2. Limit starchy foods to TWO per meal and  ONE per snack. ONE portion of a starchy  food is equal to the following:   - ONE slice of bread (or its equivalent, such as half of a hamburger bun).   - 1/2 cup of a "scoopable" starchy food such as potatoes or rice.   - 15 grams of carbohydrate as shown on food label.  3. Include at every meal: a protein food, a carb food, and vegetables and/or fruit.   - Obtain twice the volume of veg's as protein or carbohydrate foods for both lunch and dinner.   - Fresh or frozen veg's are best.   - Keep frozen veg's on hand for a quick vegetable serving.

## 2017-10-25 NOTE — Assessment & Plan Note (Addendum)
BP today 136/98. Patient currently on amlodipine, carvedilol and losartan. He reports on taking his medications as prescribed except for losartan he has been taking 50 mg instead of 100 mg. Discuss adherence to medication. Close to goal, will continue to monitor.

## 2017-10-25 NOTE — Telephone Encounter (Signed)
Patient Alex [name] on nurse line and states that Dr. Andy Gauss asked him to call back when he gets around his medications.  Pt is confused on what he is supposed to take.  Will forward to MD. Alex [name], Alex [name], Alex [name]

## 2017-10-25 NOTE — Assessment & Plan Note (Signed)
>>  ASSESSMENT AND PLAN FOR DIABETES TYPE 2, CONTROLLED WRITTEN ON 10/25/2017  9:36 AM BY DIALLO, ABDOULAYE, MD  A1c is 5.8 down from 6.4 without taking metformin. Discuss not resuming and continuing with therapeutic lifestyle changes. Handout on Diabetic diet given. Will follow up in 3 months.

## 2017-10-25 NOTE — Progress Notes (Signed)
   Subjective:    Patient ID: Alex [name], male    DOB: 10-08-1955, 62 y.o.   MRN: [id number]   CC: T2DM follow up   HPI: Patient is a 62 yo male with a past medical history significant for DM, HTN, HLD, CAD with h/o MI, OSA, h/o TIA who presents today to follow up on diabetes management. Patient was recently hospitalized for chest pain but reports that he has been doing well since discharge. Patient is currently on metformin and report she has not been taking his medication. Patient also reports that he has only been taking half of his losartan because it was making him sleepy.He denies any chest pain, shortness of breath, abdominal pain, headaches, vision change.  He denies any polyuria and polydipsia.   Smoking status reviewed   ROS: all other systems were reviewed and are negative other than in the HPI   Past Medical History:  Diagnosis Date  . Asthma   . CAD (coronary artery disease)    Cardiac cath 2002 with 25% distal RCA stenosis.   . DDD (degenerative disc disease), lumbar   . Diabetes mellitus ([place])   . Esophageal stricture   . Gastritis 2010  . GERD (gastroesophageal reflux disease)   . Gout   . HTN (hypertension)   . Hyperlipidemia   . Insomnia   . MI (myocardial infarction) ([place]) 2009   no stent placement  . OA (osteoarthritis)   . Obesity   . OSA on CPAP   . Seasonal allergies   . TIA (transient ischemic attack) 2013    Past Surgical History:  Procedure Laterality Date  . ESOPHAGOGASTRODUODENOSCOPY     multiple  . NASAL SINUS SURGERY    . VASECTOMY      Past medical history, surgical, family, and social history reviewed and updated in the EMR as appropriate.  Objective:  BP (!) 142/108   Pulse 69   Temp 98 F (36.7 C) (Oral)   Wt (!) 315 lb 6.4 oz (143.1 kg)   SpO2 97%   BMI 49.40 kg/m   Vitals and nursing note reviewed  General: Obese man, NAD, pleasant, able to participate in exam Cardiac: RRR, normal heart sounds, no murmurs. 2+ radial  and PT pulses bilaterally Respiratory: CTAB, normal effort, No wheezes, rales or rhonchi Abdomen: soft, nontender, nondistended, no hepatic or splenomegaly, +BS Extremities: no edema or cyanosis. WWP. Skin: warm and dry, no rashes noted Neuro: alert and oriented x4, no focal deficits Psych: Normal affect and mood   Assessment & Plan:   Diabetes type 2, controlled A1c is 5.8 down from 6.4 without taking metformin. Discuss not resuming and continuing with therapeutic lifestyle changes. Handout on Diabetic diet given. Will follow up in 3 months.  Essential hypertension BP today 136/98. Patient currently on amlodipine, carvedilol and losartan. He reports on taking his medications as prescribed except for losartan he has been taking 50 mg instead of 100 mg. Discuss adherence to medication. Close to goal, will continue to monitor.    Marjie Skiff, MD [place] PGY-3

## 2017-10-29 NOTE — Telephone Encounter (Signed)
Called and clarified med regimen. Patient appreciative.  Thank you  Marjie Skiff, MD [place], PGY-3

## 2017-11-04 ENCOUNTER — Other Ambulatory Visit: Payer: Self-pay | Admitting: Internal Medicine

## 2017-11-04 ENCOUNTER — Other Ambulatory Visit: Payer: Self-pay

## 2017-11-04 ENCOUNTER — Other Ambulatory Visit: Payer: Self-pay | Admitting: Family Medicine

## 2017-11-05 MED ORDER — NAPROXEN 500 MG PO TABS: 250.0000 mg | ORAL_TABLET | Freq: Two times a day (BID) | ORAL | 0 refills | Status: DC

## 2017-11-19 ENCOUNTER — Other Ambulatory Visit: Payer: Self-pay

## 2017-11-19 NOTE — Patient Outreach (Signed)
[facility]) Care Management  11/19/2017  Alex [name] 25-Jun-1955 [id number]  TELEPHONE SCREENING Referral date: 11/05/17 Referral source: [facility] Referral reason: Poor compliance with medication Insurance: Health team advantage Attempt #1  Telephone call to patient regarding [facility] referral. Unable to reach patient. HIPAA complaint voice message left with call back phone number.   PLAN: RNCM will attempt 2nd telephone call to patient within 4 business days.  RNCM will send outreach letter to patient.   Quinn Plowman RN,BSN,CCM [facility] Telephonic  [phone]

## 2017-11-21 ENCOUNTER — Other Ambulatory Visit: Payer: Self-pay

## 2017-11-21 NOTE — Patient Outreach (Signed)
[facility]) Care Management  11/21/2017  PASQUALINO WITHERSPOON [birth year] [id number]  TELEPHONE SCREENING Referral date: 11/05/17 Referral source: [facility] Referral reason: Poor compliance with medication Insurance: Health team advantage  Telephone call to patient regarding [facility] referral. HIPAA verified with patient. Explained reason for call.   Patient states he is managing his medications fine.  Patient states he might not remember the name of them sometimes but he is able to managing them. Patient reports he is taking his medication as the doctor prescribed and is able to afford them. Patient denies any side effects or concerns related to his medications.  Patient states he is managing his medical conditions and does not need any additional education related to his conditions at this time.  RNCM discussed and offered [facility] care management program. Patient declined.  RNCM advised patient to notify MD of any changes in condition prior to scheduled appointment. RNCM provided contact name and number: [phone] or main office number [phone] and 24 hour nurse advise line [phone] BY MAIL.   RNCM verified patient aware of 911 services for urgent/ emergent needs.  RNCM will close patient due to patient refusing services.  RNCM will send patient [facility] care management brochure/ magnet RNCM will send closure letter to patient primary MD    Quinn Plowman RN,BSN,CCM [facility] Telephonic  [phone]

## 2017-11-22 ENCOUNTER — Ambulatory Visit: Payer: Self-pay

## 2017-12-17 ENCOUNTER — Telehealth: Payer: Self-pay

## 2017-12-17 NOTE — Telephone Encounter (Signed)
Pt LVM on nurse line stating he received a letter from [place] him his losartan has been recalled. Pt is wondering if he needs to go back on his lisinopril or something else. Please advise.

## 2017-12-18 ENCOUNTER — Telehealth: Payer: Self-pay | Admitting: Family Medicine

## 2017-12-18 NOTE — Telephone Encounter (Signed)
Will check with MD.  Evangeline Utley,CMA  

## 2017-12-18 NOTE — Telephone Encounter (Signed)
Will send new script, talked to patient and he is aware.  Thanks  Marjie Skiff, MD [place], PGY-3

## 2017-12-18 NOTE — Telephone Encounter (Signed)
Pt said his pharmacy sent him a letter stating his BP medication, Losartan, has been recalled. Pt would like to know what he should start taking. Please advise

## 2017-12-19 ENCOUNTER — Other Ambulatory Visit: Payer: Self-pay | Admitting: Family Medicine

## 2017-12-19 MED ORDER — IRBESARTAN 150 MG PO TABS: 150.0000 mg | ORAL_TABLET | Freq: Every day | ORAL | 1 refills | Status: DC

## 2017-12-19 NOTE — Telephone Encounter (Signed)
Irbesartan was sent to pharmacy and ready for pick up. Take as prescribed. Please let patient know.  Thank you  Marjie Skiff, MD [place], PGY-3

## 2017-12-19 NOTE — Telephone Encounter (Signed)
Pt calling back because his pharmacy has not received a new RX and I do not see where anything has been called in. He thought it was being called in yesterday.  Please call pt once this has been done.

## 2017-12-24 ENCOUNTER — Telehealth: Payer: Self-pay | Admitting: Family Medicine

## 2017-12-24 NOTE — Telephone Encounter (Signed)
Patient dropped envelope off at front desk to put in pcp box.  Envelope sealed. Placed in Dr Andy Gauss mail box

## 2018-01-02 ENCOUNTER — Ambulatory Visit (INDEPENDENT_AMBULATORY_CARE_PROVIDER_SITE_OTHER): Payer: PPO | Admitting: Family Medicine

## 2018-01-02 ENCOUNTER — Other Ambulatory Visit: Payer: Self-pay

## 2018-01-02 ENCOUNTER — Encounter: Payer: Self-pay | Admitting: Family Medicine

## 2018-01-02 VITALS — BP 132/80 | HR 78 | Temp 98.3°F | Wt 311.0 lb

## 2018-01-02 DIAGNOSIS — R1030 Lower abdominal pain, unspecified: Secondary | ICD-10-CM | POA: Diagnosis not present

## 2018-01-02 LAB — POCT URINALYSIS DIP (MANUAL ENTRY)
Bilirubin, UA: NEGATIVE
Glucose, UA: NEGATIVE mg/dL
Ketones, POC UA: NEGATIVE mg/dL
Leukocytes, UA: NEGATIVE
Nitrite, UA: NEGATIVE
Protein Ur, POC: NEGATIVE mg/dL
Spec Grav, UA: 1.015 (ref 1.010–1.025)
Urobilinogen, UA: 0.2 E.U./dL
pH, UA: 5.5 (ref 5.0–8.0)

## 2018-01-02 LAB — HEMOCCULT GUIAC POC 1CARD (OFFICE): Fecal Occult Blood, POC: NEGATIVE

## 2018-01-02 LAB — POCT UA - MICROSCOPIC ONLY

## 2018-01-02 NOTE — Patient Instructions (Signed)
Your painful abdomen may be from a viral illness.   Viral Gastroenteritis, Adult Viral gastroenteritis is also known as the stomach flu. This condition is caused by certain germs (viruses). These germs can be passed from person to person very easily (are very contagious). This condition can cause sudden watery poop (diarrhea), fever, and throwing up (vomiting). Having watery poop and throwing up can make you feel weak and cause you to get dehydrated. Dehydration can make you tired and thirsty, make you have a dry mouth, and make it so you pee (urinate) less often. Older adults and people with other diseases or a weak defense system (immune system) are at higher risk for dehydration. It is important to replace the fluids that you lose from having watery poop and throwing up. Follow these instructions at home: Follow instructions from your doctor about how to care for yourself at home. Eating and drinking  Follow these instructions as told by your doctor:  Take an oral rehydration solution (ORS). This is a drink that is sold at pharmacies and stores.  Drink clear fluids in small amounts as you are able, such as: ? Water. ? Ice chips. ? Diluted fruit juice. ? Low-calorie sports drinks.  Eat bland, easy-to-digest foods in small amounts as you are able, such as: ? Bananas. ? Applesauce. ? Rice. ? Low-fat (lean) meats. ? Toast. ? Crackers.  Avoid fluids that have a lot of sugar or caffeine in them.  Avoid alcohol.  Avoid spicy or fatty foods.  General instructions  Drink enough fluid to keep your pee (urine) clear or pale yellow.  Wash your hands often. If you cannot use soap and water, use hand sanitizer.  Make sure that all people in your home wash their hands well and often.  Rest at home while you get better.  Take over-the-counter and prescription medicines only as told by your doctor.  Watch your condition for any changes.  Take a warm bath to help with any burning or  pain from having watery poop.  Keep all follow-up visits as told by your doctor. This is important. Contact a doctor if:  You cannot keep fluids down.  Your symptoms get worse.  You have new symptoms.  You feel light-headed or dizzy.  You have muscle cramps. Get help right away if:  You have chest pain.  You feel very weak or you pass out (faint).  You see blood in your throw-up.  Your throw-up looks like coffee grounds.  You have bloody or black poop (stools) or poop that look like tar.  You have a very bad headache, a stiff neck, or both.  You have a rash.  You have very bad pain, cramping, or bloating in your belly (abdomen).  You have trouble breathing.  You are breathing very quickly.  Your heart is beating very quickly.  Your skin feels cold and clammy.  You feel confused.  You have pain when you pee.  You have signs of dehydration, such as: ? Dark pee, hardly any pee, or no pee. ? Cracked lips. ? Dry mouth. ? Sunken eyes. ? Sleepiness. ? Weakness. This information is not intended to replace advice given to you by your health care provider. Make sure you discuss any questions you have with your health care provider. Document Released: 08/15/2007 Document Revised: 09/16/2015 Document Reviewed: 11/02/2014 Elsevier Interactive Patient Education  2017 [employer].

## 2018-01-02 NOTE — Progress Notes (Signed)
ABDOMINAL PAIN  Location: bilateral lower abdomen Onset: day before yesterday  Radiation: into midline lower back  Severity: moderate Quality: ache Pattern: intermittent Course: stable  Better with: Peptobismol  Worse with: no change with food.    Symptoms Nausea/Vomiting: no  Diarrhea: yes, several loose watery stools since yesterday Constipation: no  Melena/BRBPR: yes - Black stool Hematemesis: no  Anorexia: no  Fever/Chills: no  Dysuria: no Difficulty voiding: no  EtOH use: no  NSAIDs/ASA: yes,   Takes naprosyn once a day most days. Takes omeprazole 40 mg daily. Took Peptobismol yesterday  SH: former smoker PMH: no abdominal surgeries; Colonoscopy (Dr Carlean Purl 09/2012, CC blood in stool), finding 2 diminutive hyperplastic polyps excised form desc and sigmoid colon.  Severe diverticulosis desc and sigmoid colon.  Internal and external hemorrhoids.              ROS: No difficulty voiding No pain below scrotum(perineal body area) No recent antibiotics Physical Exam VS reviewed Gen: NAD, cooperative, polite, groomed Lung: BCTA, no acc mm use Cor: Distant heart sound, RRR  No peripheral edema Abd: Large fat layer, (+) BS, Nontender, Not distended Rectal: rectal tag 6 O'clock position, Prostate 1+ bilaterally symmetric, nontender to palp, scant thin brown stool on DRE Hemoccult negative  Labs:  AST 48 (H) (23 10/2016) ALT 50 (H) (31 10/2016) Alk Phos WNL TB WNL SCr 1.2 WBC 7.2 w/ normal diff Hgb13.3 Lipase 33 ESR 58 mmHg  Urinalysis: unremarkable  Assessment/Plan 1) Nonspecific presentation acute bilateral lower abdominal pain - no red flags for acute abdomen - acute onset watery, nonbloody,diarrhea; hemoccult negative - labs reassuring: ESR 58 nonspecific.  - Ddx: VGE, PUD, acute colitis, diverticulitis, ureteral stone         - Given concurrent onset of diarrhea, favor VGE                  - Treat with supportive care        - Monitor for resolution  within a week, or worsening of symptoms        - Return precautions given

## 2018-01-03 ENCOUNTER — Encounter: Payer: Self-pay | Admitting: Family Medicine

## 2018-01-03 ENCOUNTER — Telehealth: Payer: Self-pay | Admitting: Family Medicine

## 2018-01-03 LAB — CMP14+EGFR
ALT: 50 IU/L — ABNORMAL HIGH (ref 0–44)
AST: 48 IU/L — ABNORMAL HIGH (ref 0–40)
Albumin/Globulin Ratio: 1.3 (ref 1.2–2.2)
Albumin: 4.2 g/dL (ref 3.6–4.8)
Alkaline Phosphatase: 101 IU/L (ref 39–117)
BUN/Creatinine Ratio: 14 (ref 10–24)
BUN: 16 mg/dL (ref 8–27)
Bilirubin Total: 0.5 mg/dL (ref 0.0–1.2)
CO2: 22 mmol/L (ref 20–29)
Calcium: 9.5 mg/dL (ref 8.6–10.2)
Chloride: 103 mmol/L (ref 96–106)
Creatinine, Ser: 1.16 mg/dL (ref 0.76–1.27)
GFR calc Af Amer: 78 mL/min/{1.73_m2} (ref >59)
GFR calc non Af Amer: 67 mL/min/{1.73_m2} (ref >59)
Globulin, Total: 3.3 g/dL (ref 1.5–4.5)
Glucose: 100 mg/dL — ABNORMAL HIGH (ref 65–99)
Potassium: 4.5 mmol/L (ref 3.5–5.2)
Sodium: 139 mmol/L (ref 134–144)
Total Protein: 7.5 g/dL (ref 6.0–8.5)

## 2018-01-03 LAB — CBC WITH DIFFERENTIAL/PLATELET
Basophils Absolute: 0 10*3/uL (ref 0.0–0.2)
Basos: 0 %
EOS (ABSOLUTE): 0.4 10*3/uL (ref 0.0–0.4)
Eos: 6 %
Hematocrit: 40.6 % (ref 37.5–51.0)
Hemoglobin: 13.3 g/dL (ref 13.0–17.7)
Immature Grans (Abs): 0 10*3/uL (ref 0.0–0.1)
Immature Granulocytes: 0 %
Lymphocytes Absolute: 2.7 10*3/uL (ref 0.7–3.1)
Lymphs: 37 %
MCH: 25.3 pg — ABNORMAL LOW (ref 26.6–33.0)
MCHC: 32.8 g/dL (ref 31.5–35.7)
MCV: 77 fL — ABNORMAL LOW (ref 79–97)
Monocytes Absolute: 0.6 10*3/uL (ref 0.1–0.9)
Monocytes: 8 %
Neutrophils Absolute: 3.5 10*3/uL (ref 1.4–7.0)
Neutrophils: 49 %
Platelets: 281 10*3/uL (ref 150–450)
RBC: 5.26 x10E6/uL (ref 4.14–5.80)
RDW: 14.9 % (ref 12.3–15.4)
WBC: 7.2 10*3/uL (ref 3.4–10.8)

## 2018-01-03 LAB — SEDIMENTATION RATE: Sed Rate: 58 mm/hr — ABNORMAL HIGH (ref 0–30)

## 2018-01-03 LAB — LIPASE: Lipase: 33 U/L (ref 13–78)

## 2018-01-06 ENCOUNTER — Ambulatory Visit: Payer: PPO

## 2018-01-06 NOTE — Telephone Encounter (Signed)
I spoke with Alex [name]. He feels better than from last week.  He is still a little queasy, but is eating and drinking fluids well.   His diarrhea has resolved.   A/ possible VGE - improving P/ Continue self monitoring.  Notify our office if he worsens.

## 2018-01-27 ENCOUNTER — Other Ambulatory Visit: Payer: Self-pay | Admitting: *Deleted

## 2018-01-27 MED ORDER — NAPROXEN 500 MG PO TABS: 250.0000 mg | ORAL_TABLET | Freq: Two times a day (BID) | ORAL | 0 refills | Status: DC

## 2018-02-13 ENCOUNTER — Other Ambulatory Visit: Payer: Self-pay

## 2018-02-13 MED ORDER — IRBESARTAN 150 MG PO TABS: 150.0000 mg | ORAL_TABLET | Freq: Every day | ORAL | 2 refills | Status: DC

## 2018-04-11 ENCOUNTER — Ambulatory Visit (INDEPENDENT_AMBULATORY_CARE_PROVIDER_SITE_OTHER): Payer: PPO | Admitting: Family Medicine

## 2018-04-11 ENCOUNTER — Encounter: Payer: Self-pay | Admitting: Family Medicine

## 2018-04-11 VITALS — BP 122/72 | HR 66 | Temp 97.9°F | Wt 304.1 lb

## 2018-04-11 DIAGNOSIS — I10 Essential (primary) hypertension: Secondary | ICD-10-CM | POA: Diagnosis not present

## 2018-04-11 DIAGNOSIS — E782 Mixed hyperlipidemia: Secondary | ICD-10-CM | POA: Diagnosis not present

## 2018-04-11 DIAGNOSIS — Z23 Encounter for immunization: Secondary | ICD-10-CM | POA: Diagnosis not present

## 2018-04-11 DIAGNOSIS — E119 Type 2 diabetes mellitus without complications: Secondary | ICD-10-CM | POA: Diagnosis not present

## 2018-04-11 LAB — POCT GLYCOSYLATED HEMOGLOBIN (HGB A1C): HbA1c, POC (controlled diabetic range): 5.7 % (ref 0.0–7.0)

## 2018-04-11 MED ORDER — TETANUS-DIPHTH-ACELL PERTUSSIS 5-2.5-18.5 LF-MCG/0.5 IM SUSP: 0.5000 mL | Freq: Once | INTRAMUSCULAR | 0 refills | Status: AC

## 2018-04-11 NOTE — Assessment & Plan Note (Signed)
Pressure today is 122/70.  At goal we will continue current medication regimen with amlodipine, Coreg and irbesartan. --Follow-up on CMP

## 2018-04-11 NOTE — Assessment & Plan Note (Signed)
A1c today is 5.7 down from 5.8.  Currently on metformin 500 mg once daily.  His next A1c is within normal limits we will try to take him off metformin. --Follow-up on CMP

## 2018-04-11 NOTE — Assessment & Plan Note (Signed)
>>  ASSESSMENT AND PLAN FOR DIABETES TYPE 2, CONTROLLED WRITTEN ON 04/11/2018  1:13 PM BY DIALLO, ABDOULAYE, MD  A1c today is 5.7 down from 5.8.  Currently on metformin 500 mg once daily.  His next A1c is within normal limits we will try to take him off metformin. --Follow-up on CMP

## 2018-04-11 NOTE — Progress Notes (Signed)
   Subjective:    Patient ID: Alex [name], male    DOB: 10/05/1955, 63 y.o.   MRN: [id number]   CC: Follow-up for diabetes and hyperlipidemia  HPI: Patient is a 63 year old male with past medical history significant for type 2 diabetes, hypertension, hyperlipidemia, obesity, degenerative disc disease who presents today to follow-up on type 2 diabetes management as well as hyperlipidemia.  Patient reports he has been taking medication as prescribed except for his cholesterol medication.  He overall feels well and has no acute complaints today.  He has been trying to lose weight and is 10 lbs lighter since last time he was seen in clinic.  He denies any polyuria or polydipsia.  Denies any chest pain, shortness of breath, abdominal pain, nausea, vomiting, dizziness, headache.  Smoking status reviewed   ROS: all other systems were reviewed and are negative other than in the HPI   Past Medical History:  Diagnosis Date  . Asthma   . CAD (coronary artery disease)    Cardiac cath 2002 with 25% distal RCA stenosis.   . DDD (degenerative disc disease), lumbar   . Diabetes mellitus ([place])   . Esophageal stricture   . Gastritis 2010  . GERD (gastroesophageal reflux disease)   . Gout   . HTN (hypertension)   . Hyperlipidemia   . Insomnia   . MI (myocardial infarction) ([place]) 2009   no stent placement  . OA (osteoarthritis)   . Obesity   . OSA on CPAP   . Seasonal allergies   . TIA (transient ischemic attack) 2013    Past Surgical History:  Procedure Laterality Date  . ESOPHAGOGASTRODUODENOSCOPY     multiple  . NASAL SINUS SURGERY    . VASECTOMY      Past medical history, surgical, family, and social history reviewed and updated in the EMR as appropriate.  Objective:  BP 122/72   Pulse 66   Temp 97.9 F (36.6 C) (Oral)   Wt (!) 304 lb 2 oz (138 kg)   SpO2 97%   BMI 47.63 kg/m   Vitals and nursing note reviewed  General: Obese man, NAD, pleasant, able to participate in  exam Cardiac: RRR, normal heart sounds, no murmurs. 2+ radial and PT pulses bilaterally Respiratory: CTAB, normal effort, No wheezes, rales or rhonchi Abdomen: soft, nontender, nondistended, no hepatic or splenomegaly, +BS Extremities: no edema or cyanosis. WWP. Skin: warm and dry, no rashes noted Neuro: alert and oriented x4, no focal deficits Psych: Normal affect and mood   Assessment & Plan:    Diabetes type 2, controlled A1c today is 5.7 down from 5.8.  Currently on metformin 500 mg once daily.  His next A1c is within normal limits we will try to take him off metformin. --Follow-up on CMP  Essential hypertension Pressure today is 122/70.  At goal we will continue current medication regimen with amlodipine, Coreg and irbesartan. --Follow-up on CMP  HLD (hyperlipidemia) Patient has been nonadherent to cholesterol medication.  Reports he has not taken Crestor in the past few months.  Discussed importance of adherence to cholesterol medication given history of TIA. We will repeat lipid panel today and follow-up on results for any needed titration.    Marjie Skiff, MD [place] PGY-3

## 2018-04-11 NOTE — Assessment & Plan Note (Signed)
Patient has been nonadherent to cholesterol medication.  Reports he has not taken Crestor in the past few months.  Discussed importance of adherence to cholesterol medication given history of TIA. We will repeat lipid panel today and follow-up on results for any needed titration.

## 2018-04-11 NOTE — Patient Instructions (Signed)
It was great seeing you today! We have addressed the following issues today  1. I will do blood work today and will follow up on the results. 2. Continue to take your medications as discussed. Alternate between naproxen and tylenol. 3. Work on weight loss  If we did any lab work today, and the results require attention, either me or my nurse will get in touch with you. If everything is normal, you will get a letter in mail and a message via . If you don't hear from [place] in two weeks, please give [place] a call. Otherwise, we look forward to seeing you again at your next visit. If you have any questions or concerns before then, please call the clinic at [phone].  Please bring all your medications to every doctors visit  Sign up for My Chart to have easy access to your labs results, and communication with your Primary care physician. Please ask Front Desk for some assistance.   Please check-out at the front desk before leaving the clinic.    Take Care,   Dr. Andy Gauss

## 2018-04-12 LAB — LIPID PANEL
Chol/HDL Ratio: 7.7 ratio — ABNORMAL HIGH (ref 0.0–5.0)
Cholesterol, Total: 222 mg/dL — ABNORMAL HIGH (ref 100–199)
HDL: 29 mg/dL — ABNORMAL LOW (ref >39)
LDL Calculated: 152 mg/dL — ABNORMAL HIGH (ref 0–99)
Triglycerides: 204 mg/dL — ABNORMAL HIGH (ref 0–149)
VLDL Cholesterol Cal: 41 mg/dL — ABNORMAL HIGH (ref 5–40)

## 2018-04-12 LAB — CMP14+EGFR
ALT: 26 IU/L (ref 0–44)
AST: 25 IU/L (ref 0–40)
Albumin/Globulin Ratio: 1.5 (ref 1.2–2.2)
Albumin: 4.3 g/dL (ref 3.8–4.8)
Alkaline Phosphatase: 78 IU/L (ref 39–117)
BUN/Creatinine Ratio: 15 (ref 10–24)
BUN: 18 mg/dL (ref 8–27)
Bilirubin Total: 0.4 mg/dL (ref 0.0–1.2)
CO2: 22 mmol/L (ref 20–29)
Calcium: 9.7 mg/dL (ref 8.6–10.2)
Chloride: 103 mmol/L (ref 96–106)
Creatinine, Ser: 1.21 mg/dL (ref 0.76–1.27)
GFR calc Af Amer: 74 mL/min/{1.73_m2} (ref >59)
GFR calc non Af Amer: 64 mL/min/{1.73_m2} (ref >59)
Globulin, Total: 2.8 g/dL (ref 1.5–4.5)
Glucose: 108 mg/dL — ABNORMAL HIGH (ref 65–99)
Potassium: 4.4 mmol/L (ref 3.5–5.2)
Sodium: 140 mmol/L (ref 134–144)
Total Protein: 7.1 g/dL (ref 6.0–8.5)

## 2018-05-02 ENCOUNTER — Other Ambulatory Visit: Payer: Self-pay | Admitting: Family Medicine

## 2018-05-13 ENCOUNTER — Other Ambulatory Visit: Payer: Self-pay | Admitting: Family Medicine

## 2018-05-15 ENCOUNTER — Other Ambulatory Visit: Payer: Self-pay | Admitting: Family Medicine

## 2018-05-21 ENCOUNTER — Other Ambulatory Visit: Payer: Self-pay | Admitting: Family Medicine

## 2018-05-22 DIAGNOSIS — H25042 Posterior subcapsular polar age-related cataract, left eye: Secondary | ICD-10-CM | POA: Diagnosis not present

## 2018-05-22 DIAGNOSIS — H2513 Age-related nuclear cataract, bilateral: Secondary | ICD-10-CM | POA: Diagnosis not present

## 2018-05-22 DIAGNOSIS — H25013 Cortical age-related cataract, bilateral: Secondary | ICD-10-CM | POA: Diagnosis not present

## 2018-05-22 DIAGNOSIS — H5310 Unspecified subjective visual disturbances: Secondary | ICD-10-CM | POA: Diagnosis not present

## 2018-05-26 ENCOUNTER — Other Ambulatory Visit: Payer: Self-pay | Admitting: Family Medicine

## 2018-06-22 ENCOUNTER — Other Ambulatory Visit: Payer: Self-pay | Admitting: Family Medicine

## 2018-06-27 ENCOUNTER — Other Ambulatory Visit: Payer: Self-pay | Admitting: Family Medicine

## 2018-07-10 ENCOUNTER — Other Ambulatory Visit: Payer: Self-pay | Admitting: Family Medicine

## 2018-07-29 ENCOUNTER — Other Ambulatory Visit: Payer: Self-pay | Admitting: Family Medicine

## 2018-07-30 ENCOUNTER — Ambulatory Visit (INDEPENDENT_AMBULATORY_CARE_PROVIDER_SITE_OTHER): Payer: PPO | Admitting: Family Medicine

## 2018-07-30 ENCOUNTER — Other Ambulatory Visit: Payer: Self-pay

## 2018-07-30 VITALS — BP 120/80 | HR 73

## 2018-07-30 DIAGNOSIS — G4733 Obstructive sleep apnea (adult) (pediatric): Secondary | ICD-10-CM | POA: Diagnosis not present

## 2018-07-30 DIAGNOSIS — E119 Type 2 diabetes mellitus without complications: Secondary | ICD-10-CM

## 2018-07-30 NOTE — Assessment & Plan Note (Signed)
Patient with history of OSA, has been adherent to CPAP for the past 5 years. Last echo with normal EF and G1DD. No significant increase in PA pressure. Should continue with current settings. Provided patient with new DME script for new CPAP supply. Patient will follow up as needed.

## 2018-07-30 NOTE — Progress Notes (Signed)
   Subjective:    Patient ID: Alex [name], male    DOB: 20-Aug-1955, 63 y.o.   MRN: [id number]   CC: Sleep apnea  HPI:  Patient is a 63 yo male who presents today to discuss sleep apnea. Patient reports that he has been using his CPAP machine every night. Patient was diagnosed with OSA in 2014. Patient reports that tubing and mask for his CPAP machine need replacement. He contacted [facility] and told he needed an order from his PCP. Patient is otherwise well with no acute complaints today. Patient reports gaining weight due to inability to exercise due to confinement. He continue to be adherent to all his medications. Patient denies any chest pain, abdominal pain, SOB, LE swelling, dizziness, headaches or sleepiness.  Smoking status reviewed   ROS: all other systems were reviewed and are negative other than in the HPI   Past Medical History:  Diagnosis Date  . Asthma   . CAD (coronary artery disease)    Cardiac cath 2002 with 25% distal RCA stenosis.   . DDD (degenerative disc disease), lumbar   . Diabetes mellitus ([place])   . Esophageal stricture   . Gastritis 2010  . GERD (gastroesophageal reflux disease)   . Gout   . HTN (hypertension)   . Hyperlipidemia   . Insomnia   . MI (myocardial infarction) ([place]) 2009   no stent placement  . OA (osteoarthritis)   . Obesity   . OSA on CPAP   . Seasonal allergies   . TIA (transient ischemic attack) 2013    Past Surgical History:  Procedure Laterality Date  . ESOPHAGOGASTRODUODENOSCOPY     multiple  . NASAL SINUS SURGERY    . VASECTOMY      Past medical history, surgical, family, and social history reviewed and updated in the EMR as appropriate.  Objective:  BP 120/80   Pulse 73   SpO2 96%   Vitals and nursing note reviewed  General: Obese, NAD, pleasant, able to participate in exam Cardiac: RRR, normal heart sounds, no murmurs. 2+ radial and PT pulses bilaterally Respiratory: CTAB, normal effort, No wheezes,  rales or rhonchi Abdomen: soft, nontender, nondistended, no hepatic or splenomegaly, +BS Extremities: no edema or cyanosis. WWP. Skin: warm and dry, no rashes noted Neuro: alert and oriented x4, no focal deficits Psych: Normal affect and mood   Assessment & Plan:   OSA (obstructive sleep apnea) Patient with history of OSA, has been adherent to CPAP for the past 5 years. Last echo with normal EF and G1DD. No significant increase in PA pressure. Should continue with current settings. Provided patient with new DME script for new CPAP supply. Patient will follow up as needed.    Marjie Skiff, MD [place] PGY-3

## 2018-07-31 ENCOUNTER — Other Ambulatory Visit: Payer: Self-pay | Admitting: Family Medicine

## 2018-07-31 ENCOUNTER — Telehealth: Payer: Self-pay | Admitting: *Deleted

## 2018-07-31 DIAGNOSIS — G4733 Obstructive sleep apnea (adult) (pediatric): Secondary | ICD-10-CM

## 2018-07-31 NOTE — Telephone Encounter (Signed)
Community message sent to [employer] @ [facility] to process DME order for CPAP.   Christen Bame, CMA

## 2018-08-07 NOTE — Telephone Encounter (Signed)
Message to Barbaraann Rondo to followup on request. Christen Bame, CMA

## 2018-08-08 NOTE — Telephone Encounter (Signed)
Rx is being processed now. Alex [name], CMA

## 2018-08-08 NOTE — Telephone Encounter (Signed)
Contacted pt to see if he had heard from [employer] about CPAP machine.  He has not.  Message send to Darlina Guys to see if I am sending in the correct manner. Christen Bame, CMA

## 2018-08-11 DIAGNOSIS — G4733 Obstructive sleep apnea (adult) (pediatric): Secondary | ICD-10-CM | POA: Diagnosis not present

## 2018-08-15 DIAGNOSIS — G4733 Obstructive sleep apnea (adult) (pediatric): Secondary | ICD-10-CM | POA: Diagnosis not present

## 2018-08-15 NOTE — Telephone Encounter (Signed)
Received fax from [facility].  Need Length of care on script and to have [employer] certified provider.  Resent under Dr. Ardelia Mems name. Christen Bame, CMA

## 2018-08-15 NOTE — Addendum Note (Signed)
Addended by: Christen Bame D on: 08/15/2018 12:27 PM   Modules accepted: Orders

## 2018-08-22 ENCOUNTER — Other Ambulatory Visit: Payer: Self-pay | Admitting: Family Medicine

## 2018-08-28 ENCOUNTER — Other Ambulatory Visit: Payer: Self-pay | Admitting: Family Medicine

## 2018-09-04 ENCOUNTER — Other Ambulatory Visit: Payer: Self-pay | Admitting: Family Medicine

## 2018-09-11 MED ORDER — IRBESARTAN 150 MG PO TABS: 150.0000 mg | ORAL_TABLET | Freq: Every day | ORAL | 0 refills | Status: DC

## 2018-09-11 NOTE — Addendum Note (Signed)
Addended by: Dorna Bloom on: 09/11/2018 04:07 PM   Modules accepted: Orders

## 2018-10-27 ENCOUNTER — Other Ambulatory Visit: Payer: Self-pay

## 2018-10-29 ENCOUNTER — Other Ambulatory Visit: Payer: Self-pay | Admitting: Family Medicine

## 2018-10-29 MED ORDER — COLCHICINE 0.6 MG PO TABS: 0.6000 mg | ORAL_TABLET | Freq: Every day | ORAL | 0 refills | Status: DC

## 2018-10-29 MED ORDER — ALLOPURINOL 100 MG PO TABS: 100.0000 mg | ORAL_TABLET | Freq: Every day | ORAL | 0 refills | Status: DC

## 2018-10-29 MED ORDER — AMLODIPINE BESYLATE 10 MG PO TABS: 10.0000 mg | ORAL_TABLET | Freq: Every day | ORAL | 0 refills | Status: DC

## 2018-10-29 MED ORDER — NAPROXEN 250 MG PO TABS: 250.0000 mg | ORAL_TABLET | Freq: Two times a day (BID) | ORAL | 2 refills | Status: DC

## 2018-11-08 ENCOUNTER — Encounter (HOSPITAL_COMMUNITY): Payer: Self-pay | Admitting: Emergency Medicine

## 2018-11-08 ENCOUNTER — Inpatient Hospital Stay (HOSPITAL_COMMUNITY)
Admission: EM | Admit: 2018-11-08 | Discharge: 2018-11-11 | DRG: 378 | Disposition: A | Discharge status: Home / Self Care | Payer: PPO | Attending: Family Medicine | Admitting: Family Medicine

## 2018-11-08 DIAGNOSIS — M109 Gout, unspecified: Secondary | ICD-10-CM | POA: Diagnosis present

## 2018-11-08 DIAGNOSIS — Z8673 Personal history of transient ischemic attack (TIA), and cerebral infarction without residual deficits: Secondary | ICD-10-CM | POA: Diagnosis not present

## 2018-11-08 DIAGNOSIS — K635 Polyp of colon: Secondary | ICD-10-CM | POA: Diagnosis not present

## 2018-11-08 DIAGNOSIS — Z885 Allergy status to narcotic agent status: Secondary | ICD-10-CM

## 2018-11-08 DIAGNOSIS — Z7984 Long term (current) use of oral hypoglycemic drugs: Secondary | ICD-10-CM

## 2018-11-08 DIAGNOSIS — Z91013 Allergy to seafood: Secondary | ICD-10-CM

## 2018-11-08 DIAGNOSIS — G4733 Obstructive sleep apnea (adult) (pediatric): Secondary | ICD-10-CM | POA: Diagnosis present

## 2018-11-08 DIAGNOSIS — K922 Gastrointestinal hemorrhage, unspecified: Secondary | ICD-10-CM | POA: Diagnosis not present

## 2018-11-08 DIAGNOSIS — G4730 Sleep apnea, unspecified: Secondary | ICD-10-CM | POA: Diagnosis not present

## 2018-11-08 DIAGNOSIS — Z87891 Personal history of nicotine dependence: Secondary | ICD-10-CM | POA: Diagnosis not present

## 2018-11-08 DIAGNOSIS — J45909 Unspecified asthma, uncomplicated: Secondary | ICD-10-CM | POA: Diagnosis present

## 2018-11-08 DIAGNOSIS — Z79899 Other long term (current) drug therapy: Secondary | ICD-10-CM

## 2018-11-08 DIAGNOSIS — K219 Gastro-esophageal reflux disease without esophagitis: Secondary | ICD-10-CM | POA: Diagnosis present

## 2018-11-08 DIAGNOSIS — K625 Hemorrhage of anus and rectum: Secondary | ICD-10-CM | POA: Diagnosis not present

## 2018-11-08 DIAGNOSIS — I10 Essential (primary) hypertension: Secondary | ICD-10-CM

## 2018-11-08 DIAGNOSIS — Z03818 Encounter for observation for suspected exposure to other biological agents ruled out: Secondary | ICD-10-CM | POA: Diagnosis not present

## 2018-11-08 DIAGNOSIS — M5136 Other intervertebral disc degeneration, lumbar region: Secondary | ICD-10-CM | POA: Diagnosis not present

## 2018-11-08 DIAGNOSIS — K648 Other hemorrhoids: Secondary | ICD-10-CM | POA: Diagnosis not present

## 2018-11-08 DIAGNOSIS — D122 Benign neoplasm of ascending colon: Secondary | ICD-10-CM | POA: Diagnosis not present

## 2018-11-08 DIAGNOSIS — Z6841 Body Mass Index (BMI) 40.0 and over, adult: Secondary | ICD-10-CM

## 2018-11-08 DIAGNOSIS — Z20828 Contact with and (suspected) exposure to other viral communicable diseases: Secondary | ICD-10-CM | POA: Diagnosis present

## 2018-11-08 DIAGNOSIS — Z7982 Long term (current) use of aspirin: Secondary | ICD-10-CM

## 2018-11-08 DIAGNOSIS — R4701 Aphasia: Secondary | ICD-10-CM | POA: Diagnosis present

## 2018-11-08 DIAGNOSIS — K5730 Diverticulosis of large intestine without perforation or abscess without bleeding: Secondary | ICD-10-CM | POA: Diagnosis not present

## 2018-11-08 DIAGNOSIS — D649 Anemia, unspecified: Secondary | ICD-10-CM | POA: Diagnosis not present

## 2018-11-08 DIAGNOSIS — Z888 Allergy status to other drugs, medicaments and biological substances status: Secondary | ICD-10-CM

## 2018-11-08 DIAGNOSIS — Z9989 Dependence on other enabling machines and devices: Secondary | ICD-10-CM | POA: Diagnosis not present

## 2018-11-08 DIAGNOSIS — E1159 Type 2 diabetes mellitus with other circulatory complications: Secondary | ICD-10-CM

## 2018-11-08 DIAGNOSIS — K8020 Calculus of gallbladder without cholecystitis without obstruction: Secondary | ICD-10-CM | POA: Diagnosis not present

## 2018-11-08 DIAGNOSIS — E119 Type 2 diabetes mellitus without complications: Secondary | ICD-10-CM | POA: Diagnosis not present

## 2018-11-08 DIAGNOSIS — K5731 Diverticulosis of large intestine without perforation or abscess with bleeding: Principal | ICD-10-CM | POA: Diagnosis present

## 2018-11-08 DIAGNOSIS — K921 Melena: Secondary | ICD-10-CM | POA: Diagnosis not present

## 2018-11-08 DIAGNOSIS — I2510 Atherosclerotic heart disease of native coronary artery without angina pectoris: Secondary | ICD-10-CM | POA: Diagnosis not present

## 2018-11-08 DIAGNOSIS — D124 Benign neoplasm of descending colon: Secondary | ICD-10-CM | POA: Diagnosis not present

## 2018-11-08 DIAGNOSIS — E785 Hyperlipidemia, unspecified: Secondary | ICD-10-CM | POA: Diagnosis present

## 2018-11-08 DIAGNOSIS — D62 Acute posthemorrhagic anemia: Secondary | ICD-10-CM | POA: Diagnosis present

## 2018-11-08 DIAGNOSIS — K644 Residual hemorrhoidal skin tags: Secondary | ICD-10-CM | POA: Diagnosis not present

## 2018-11-08 DIAGNOSIS — E1151 Type 2 diabetes mellitus with diabetic peripheral angiopathy without gangrene: Secondary | ICD-10-CM | POA: Diagnosis not present

## 2018-11-08 DIAGNOSIS — E1169 Type 2 diabetes mellitus with other specified complication: Secondary | ICD-10-CM | POA: Diagnosis present

## 2018-11-08 DIAGNOSIS — I152 Hypertension secondary to endocrine disorders: Secondary | ICD-10-CM | POA: Diagnosis not present

## 2018-11-08 DIAGNOSIS — I252 Old myocardial infarction: Secondary | ICD-10-CM

## 2018-11-08 DIAGNOSIS — Z7951 Long term (current) use of inhaled steroids: Secondary | ICD-10-CM

## 2018-11-08 DIAGNOSIS — D500 Iron deficiency anemia secondary to blood loss (chronic): Secondary | ICD-10-CM | POA: Diagnosis not present

## 2018-11-08 DIAGNOSIS — Z8249 Family history of ischemic heart disease and other diseases of the circulatory system: Secondary | ICD-10-CM

## 2018-11-08 DIAGNOSIS — Z791 Long term (current) use of non-steroidal anti-inflammatories (NSAID): Secondary | ICD-10-CM

## 2018-11-08 LAB — COMPREHENSIVE METABOLIC PANEL
ALT: 32 U/L (ref 0–44)
AST: 31 U/L (ref 15–41)
Albumin: 3.8 g/dL (ref 3.5–5.0)
Alkaline Phosphatase: 70 U/L (ref 38–126)
Anion gap: 9 (ref 5–15)
BUN: 18 mg/dL (ref 8–23)
CO2: 24 mmol/L (ref 22–32)
Calcium: 9.3 mg/dL (ref 8.9–10.3)
Chloride: 105 mmol/L (ref 98–111)
Creatinine, Ser: 1.17 mg/dL (ref 0.61–1.24)
GFR calc Af Amer: >60 mL/min (ref >60)
GFR calc non Af Amer: >60 mL/min (ref >60)
Glucose, Bld: 158 mg/dL — ABNORMAL HIGH (ref 70–99)
Potassium: 4.2 mmol/L (ref 3.5–5.1)
Sodium: 138 mmol/L (ref 135–145)
Total Bilirubin: 0.5 mg/dL (ref 0.3–1.2)
Total Protein: 7.1 g/dL (ref 6.5–8.1)

## 2018-11-08 LAB — POC OCCULT BLOOD, ED: Fecal Occult Bld: POSITIVE — AB

## 2018-11-08 LAB — CBC
HCT: 40.7 % (ref 39.0–52.0)
Hemoglobin: 12.8 g/dL — ABNORMAL LOW (ref 13.0–17.0)
MCH: 26.3 pg (ref 26.0–34.0)
MCHC: 31.4 g/dL (ref 30.0–36.0)
MCV: 83.6 fL (ref 80.0–100.0)
Platelets: 330 10*3/uL (ref 150–400)
RBC: 4.87 MIL/uL (ref 4.22–5.81)
RDW: 15.2 % (ref 11.5–15.5)
WBC: 9.3 10*3/uL (ref 4.0–10.5)
nRBC: 0 % (ref 0.0–0.2)

## 2018-11-08 LAB — ABO/RH: ABO/RH(D): A POS

## 2018-11-08 NOTE — H&P (Addendum)
[facility] Admission History and Physical Service Pager: [phone]  Patient name: Alex [name] Medical record number: [id number] Date of birth: [birth year] Age: 63 y.o. Gender: male  Primary Care Provider: Matilde Haymaker, MD Consultants: GI in am Code Status: Full Preferred Emergency Contact: Florencia Reasons - [phone]  Chief Complaint: rectal bleeding  Assessment and Plan: Alex [name] is a 63 y.o. male presenting with rectal bleeding. PMH is significant for type 2 diabetes, GERD, hypertension, CAD, HLD, OSA on CPAP, gout, OA, h/o TIA  Rectal bleeding About 3 episodes of dark red loose stools this afternoon with associated generalized weakness and lower abdominal discomfort without pain. FOBT positive in ED. Abdominal exam without tenderness to palpation. No BM since presentation. Last colonoscopy 2014 with severe diverticulosis in descending and sigmoid colon and internal hemorrhoids. Previously had EGD in 2010 with distal esophageal stricture now s/p dilation, 3cm hiatal hernia, and moderate gastritis. Per chart review, had similar presentation 12/2017 though was hemoccult negative at that time and thought due to viral gastroenteritis. No prior h/o PUD. Denies excessive NSAID use but did endorse taking naproxen for gout flare 2 weeks ago.  Reports GERD is well controlled with home omeprazole and denies heartburn, N/V. Remote smoker, quit ~35 years ago. Is a non-drinker. Symptoms likely related to gastritis given melena, recent NSAID use, and h/o prior gastritis. Could also consider diverticulosis vs diverticulitis vs colitis though is without significant pain and only has bright red blood when he wipes. Denies FH of colon cancer, is UTD with screening, last colonoscopy removed 2 non-adenomatous polyps.  - admit to med-surg, attending Dr. Ardelia Mems - am CBC, BMP - consult GI in am - NPO, sips with meds - tylenol prn, zofran prn - mIVF - PO protonix  44m  Type 2 Diabetes Reports PCP discontinued daily metformin 5057mabout 6 months ago as his diabetes has been historically well controlled. Reports a nurse came out to his home a few weeks ago and A1c at that time was "5 something." Last a1c per chart review 5.7 in 03/2018. Glucose on admission 158. - a1c - monitor glucose with BMP  GERD On omeprazole 4079maily, stable. No current sx of acid reflux. - protonix 2m2mr formulary. - monitor   HTN Normotensive on admission. On coreg 25mg21m, irbesartan 150mg 33my, amlodipine 10mg d63m. - continue home meds  CAD Cath in 2002 with 25% stenosis in the distal RCA. Stress test in 2013 without evidence of ischemia. Last ECHO 08/2017 EF 50-55% and G1DD. On ASA 81mg.  49m Prescribed rosuvastatin 10mg [id number] reports he does not take this as it "makes him feel yucky." Per chart review had myalgias with atorvastatin. LDL 152 03/2018. - hold home statin - explore alternative options at PCP follow up  OSA on CPAP Dxed in 2014. Compliant with CPAP.  - CPAP qhs  Gout Takes allopurinol, colchicine prn. Had flare 2 weeks ago in L great MTP joint for which he also took naproxen. No current complaints. - monitor  Lumbar DDD, OA Most recent imaging lumbar XR with moderate facet disease in lumbar spine with preserved joint space. No current complaints.  - monitor  H/o TIA In 2013 (aphasia 3-5 minutes). On ASA 81mg. Int47mant to statin.   FEN/GI: NPO Prophylaxis: SCDs  Disposition: Admit to MedSurg, [place]Inty[place]  Ardelia Memsof Present Illness:  Alex [name].o. ma65 presenting with 3 episodes of dark loose stools and bright red blood  per rectum on the toilet paper starting this afternoon. Notes these episodes occurred after he ate.  States these episodes have happened before and recalls them about every 6 months or so but this significant. Denies any abdominal pain but states his lower abdomen "feels funny like [he]  has butterflies." He does not normally see a gastroenterologist for this nor has he sought care for these episodes, they typically self-resolve. No new foods. No issues eating or drinking. No early satiety or loss of appetite. He last ate at 5pm today with last BM occurring shortly after. Does endorse some generalized weakness, otherwise feels normal. Denies N/V. Reports he will occasionally get constipated about once per month, takes metamucil prn. Otherwise has soft stools once daily. Denies blood in urine but does report it was dark a few days ago, has since urinated normally and without difficulties. He denies alcohol use. Quit smoking about 35 years ago, smoked about 1.5ppd for 7 years. Denies family history of colon or esophageal cancer. GERD is well controlled on omeprazole, denies current heartburn.  Review Of Systems: Per HPI with the following additions:   Review of Systems  Constitutional: Negative for chills and fever.  HENT: Negative for congestion.   Eyes: Negative for blurred vision and double vision.  Respiratory: Negative for cough and shortness of breath.   Cardiovascular: Negative for chest pain and palpitations.  Gastrointestinal: Positive for abdominal pain, blood in stool and diarrhea. Negative for heartburn, nausea and vomiting.  Genitourinary: Negative for dysuria, flank pain and hematuria.  Skin: Negative for rash.  Neurological: Negative for dizziness and headaches.    Patient Active Problem List   Diagnosis Date Noted  . Rectal bleeding 11/08/2018  . Dehydration   . Postural dizziness with presyncope 09/05/2017  . Pre-syncope   . Ingrown toenail of left foot with infection 11/02/2016  . Screen for STD (sexually transmitted disease) 08/14/2016  . Fatigue 11/12/2014  . Vision changes 07/23/2014  . Right foot strain 05/14/2014  . GERD (gastroesophageal reflux disease) 12/17/2013  . Seasonal allergies 06/15/2013  . Dyspnea 05/16/2013  . Headache 12/03/2012  .  Rash and nonspecific skin eruption 10/07/2012  . Internal hemorrhoids with other complication 17/91/5056  . Coronary artery disease 07/14/2012  . OSA (obstructive sleep apnea) 06/02/2012  . Chest pain 05/06/2012  . Diastolic heart failure ([place]) 04/02/2012  . History of tobacco use 02/22/2012  . Osteoarthritis 02/22/2012  . History of TIA (transient ischemic attack) 02/22/2012  . Obesity 07/27/2009  . INTERMITTENT CLAUDICATION 02/09/2009  . [place] DISEASE, LUMBAR SPINE 08/04/2008  . ESOPHAGEAL STRICTURE 05/20/2008  . Diabetes type 2, controlled ([place]) 02/23/2008  . HLD (hyperlipidemia) 02/23/2008  . Gout 02/23/2008  . Essential hypertension 02/23/2008    Past Medical History: Past Medical History:  Diagnosis Date  . Asthma   . CAD (coronary artery disease)    Cardiac cath 2002 with 25% distal RCA stenosis.   . DDD (degenerative disc disease), lumbar   . Diabetes mellitus ([place])   . Esophageal stricture   . Gastritis 2010  . GERD (gastroesophageal reflux disease)   . Gout   . HTN (hypertension)   . Hyperlipidemia   . Insomnia   . MI (myocardial infarction) ([place]) 2009   no stent placement  . OA (osteoarthritis)   . Obesity   . OSA on CPAP   . Seasonal allergies   . TIA (transient ischemic attack) 2013    Past Surgical History: Past Surgical History:  Procedure Laterality Date  .  ESOPHAGOGASTRODUODENOSCOPY     multiple  . NASAL SINUS SURGERY    . VASECTOMY      Social History: Social History   Tobacco Use  . Smoking status: Former Smoker    Packs/day: 1.50    Years: 10.00    Pack years: 15.00    Types: Cigarettes    Quit date: 11/06/1991    Years since quitting: 27.0  . Smokeless tobacco: Never Used  . Tobacco comment: quit 1993  Substance Use Topics  . Alcohol use: No  . Drug use: No   Additional social history: lives alone, wife died 8 years ago from lung cancer  Please also refer to relevant sections of EMR.  Family History: Family  History  Problem Relation Age of Onset  . Heart attack Mother   . Heart failure Mother   . Colon cancer Neg Hx   . Esophageal cancer Neg Hx   . Rectal cancer Neg Hx   . Stomach cancer Neg Hx     Allergies and Medications: Allergies  Allergen Reactions  . Atorvastatin Other (See Comments)    Subjective myalgias. Gave 2 trials, developed myalgias which resolved after cessation of medication.  Muscle cramps were 9/10 discomfort  . Codeine Nausea Only  . Shellfish Allergy Other (See Comments)    Activates asthma   No current facility-administered medications on file prior to encounter.    Current Outpatient Medications on File Prior to Encounter  Medication Sig Dispense Refill  . acetaminophen (TYLENOL) 500 MG tablet Take 500 mg by mouth every 6 (six) hours as needed for headache (pain).    Marland Kitchen allopurinol (ZYLOPRIM) 100 MG tablet Take 1 tablet (100 mg total) by mouth daily. 30 tablet 0  . amLODipine (NORVASC) 10 MG tablet Take 1 tablet (10 mg total) by mouth daily. (Patient taking differently: Take 10 mg by mouth daily with supper. ) 90 tablet 0  . aspirin EC 81 MG tablet Take 81 mg by mouth daily with supper.    . carvedilol (COREG) 25 MG tablet Take 1 tablet (25 mg total) by mouth 2 (two) times daily with a meal. (Patient taking differently: Take 25 mg by mouth daily with supper. ) 180 tablet 3  . colchicine 0.6 MG tablet Take 1 tablet (0.6 mg total) by mouth daily. (Patient taking differently: Take 0.6 mg by mouth daily as needed (gout flares). ) 6 tablet 0  . irbesartan (AVAPRO) 150 MG tablet Take 1 tablet (150 mg total) by mouth daily. (Patient taking differently: Take 150 mg by mouth daily with supper. ) 90 tablet 0  . naproxen (NAPROSYN) 500 MG tablet Take 500 mg by mouth daily as needed (pain).    Marland Kitchen omeprazole (PRILOSEC) 40 MG capsule TAKE 1 CAPSULE BY MOUTH ONCE DAILY (Patient taking differently: Take 40 mg by mouth daily with supper. ) 90 capsule 3  . triamcinolone ointment  (KENALOG) 0.5 % APPLY OINTMENT TOPICALLY TO AFFECTED AREA TWICE DAILY FOR THE NEXT WEEK (Patient taking differently: Apply 1 application topically 2 (two) times daily as needed (eczema flares). ) 15 g 0  . ACCU-CHEK FASTCLIX LANCETS MISC 1 Device by Does not apply route daily. 102 each 3  . aspirin 81 MG chewable tablet Chew 1 tablet (81 mg total) by mouth daily. (Patient not taking: Reported on 11/08/2018) 90 tablet 3  . Blood Glucose Monitoring Suppl (ACCU-CHEK AVIVA CONNECT) W/DEVICE KIT 1 Device by Does not apply route daily. 1 kit 0  . cetirizine (ZYRTEC) 10 MG tablet  Take 1 tablet (10 mg total) by mouth at bedtime. (Patient not taking: Reported on 09/05/2017) 90 tablet 3  . Cholecalciferol 1000 units capsule Take 1 capsule (1,000 Units total) by mouth daily. (Patient not taking: Reported on 09/05/2017) 90 capsule 2  . fluticasone (FLONASE) 50 MCG/ACT nasal spray Place 1 spray into both nostrils daily. (Patient not taking: Reported on 09/05/2017) 16 g 2  . glucose blood test strip 1 each by Other route daily. Use as instructed 100 each 12  . Lancets (FREESTYLE) lancets Use as instructed 100 each 12  . metFORMIN (GLUCOPHAGE) 500 MG tablet Take 1 tablet (500 mg total) by mouth daily. (Patient not taking: Reported on 11/08/2018) 90 tablet 0  . naproxen (NAPROSYN) 250 MG tablet Take 1 tablet (250 mg total) by mouth 2 (two) times daily with a meal. (Patient not taking: Reported on 11/08/2018) 60 tablet 2  . rosuvastatin (CRESTOR) 10 MG tablet TAKE 1 TABLET BY MOUTH AT BEDTIME (Patient not taking: Reported on 11/08/2018) 90 tablet 0    Objective: BP 130/89 (BP Location: Right Arm)   Pulse 62   Temp 97.9 F (36.6 C)   Resp 18   Ht '5\' 7"'  (1.702 m)   Wt (!) 138.3 kg   SpO2 98%   BMI 47.77 kg/m  Exam: General: Pleasant male, morbidly obese, lying in bed, in no apparent distress Eyes: EOMI Neck: no cervical lymphadenopathy Cardiovascular: RRR, no murmur Respiratory: CTAB, no wheezes/rales/rhonchi,  normal work of breathing on room air Gastrointestinal: Soft, obese abdomen, nontender to palpation, +BS MSK: Good tone throughout Derm: No appreciable lesions or rashes Neuro: Alert and oriented, speech normal Psych: Mood and affect euthymic  Labs and Imaging: CBC BMET  Recent Labs  Lab 11/08/18 1935  WBC 9.3  HGB 12.8*  HCT 40.7  PLT 330   Recent Labs  Lab 11/08/18 1935  NA 138  K 4.2  CL 105  CO2 24  BUN 18  CREATININE 1.17  GLUCOSE 158*  CALCIUM 9.3      No results found.  Rory Percy, DO 11/09/2018, 12:47 AM PGY-3, [place] Intern pager: [phone], text pages welcome

## 2018-11-08 NOTE — ED Triage Notes (Signed)
Pt reports dark blood in loose stool x several episodes since 1300-1400 today, pt denies fever, denies shob. Low abd pain onset today.

## 2018-11-08 NOTE — ED Notes (Signed)
admitting Provider at bedside. 

## 2018-11-08 NOTE — ED Provider Notes (Signed)
[facility] EMERGENCY DEPARTMENT Provider Note   CSN: [id number] Arrival date & time: 11/08/18  1922     History   Chief Complaint Chief Complaint  Patient presents with  . GI Bleeding    HPI Alex [name] is a 63 y.o. male.     Patient c/o 3-4 episodes of loose stool/rectal bleeding onset early this afternoon. Symptoms acute onset, episodic, recurrent, persistent. Denies rectal pain. No hx same. Denies abd pain. No nausea/vomiting. Denies syncope or faintness. No cp or sob. No dysuria or hematuria. No other abnormal bruising or bleeding. No anticoag use. Denies hx pud, diverticula, or other cause of gi bleeding. Is noted to have hx gastritis.   The history is provided by the patient.    Past Medical History:  Diagnosis Date  . Asthma   . CAD (coronary artery disease)    Cardiac cath 2002 with 25% distal RCA stenosis.   . DDD (degenerative disc disease), lumbar   . Diabetes mellitus ([place])   . Esophageal stricture   . Gastritis 2010  . GERD (gastroesophageal reflux disease)   . Gout   . HTN (hypertension)   . Hyperlipidemia   . Insomnia   . MI (myocardial infarction) ([place]) 2009   no stent placement  . OA (osteoarthritis)   . Obesity   . OSA on CPAP   . Seasonal allergies   . TIA (transient ischemic attack) 2013    Patient Active Problem List   Diagnosis Date Noted  . Dehydration   . Postural dizziness with presyncope 09/05/2017  . Pre-syncope   . Ingrown toenail of left foot with infection 11/02/2016  . Screen for STD (sexually transmitted disease) 08/14/2016  . Fatigue 11/12/2014  . Vision changes 07/23/2014  . Right foot strain 05/14/2014  . GERD (gastroesophageal reflux disease) 12/17/2013  . Seasonal allergies 06/15/2013  . Dyspnea 05/16/2013  . Headache 12/03/2012  . Rash and nonspecific skin eruption 10/07/2012  . Internal hemorrhoids with other complication 32/20/2542  . Coronary artery disease 07/14/2012  . OSA (obstructive  sleep apnea) 06/02/2012  . Chest pain 05/06/2012  . Diastolic heart failure ([place]) 04/02/2012  . History of tobacco use 02/22/2012  . Osteoarthritis 02/22/2012  . History of TIA (transient ischemic attack) 02/22/2012  . Obesity 07/27/2009  . INTERMITTENT CLAUDICATION 02/09/2009  . [place] DISEASE, LUMBAR SPINE 08/04/2008  . ESOPHAGEAL STRICTURE 05/20/2008  . Diabetes type 2, controlled ([place]) 02/23/2008  . HLD (hyperlipidemia) 02/23/2008  . Gout 02/23/2008  . Essential hypertension 02/23/2008    Past Surgical History:  Procedure Laterality Date  . ESOPHAGOGASTRODUODENOSCOPY     multiple  . NASAL SINUS SURGERY    . VASECTOMY          Home Medications    Prior to Admission medications   Medication Sig Start Date End Date Taking? Authorizing Provider  ACCU-CHEK FASTCLIX LANCETS MISC 1 Device by Does not apply route daily. 01/26/14   Leone Haven, MD  allopurinol (ZYLOPRIM) 100 MG tablet Take 1 tablet (100 mg total) by mouth daily. 10/29/18   Matilde Haymaker, MD  amLODipine (NORVASC) 10 MG tablet Take 1 tablet (10 mg total) by mouth daily. 10/29/18   Matilde Haymaker, MD  aspirin 81 MG chewable tablet Chew 1 tablet (81 mg total) by mouth daily. 09/24/13   Leone Haven, MD  Blood Glucose Monitoring Suppl (ACCU-CHEK AVIVA CONNECT) W/DEVICE KIT 1 Device by Does not apply route daily. 12/01/13   Leone Haven, MD  carvedilol (COREG) 25 MG tablet Take 1 tablet (25 mg total) by mouth 2 (two) times daily with a meal. Patient taking differently: Take 25 mg by mouth daily.  07/29/17   Diallo, Earna Coder, MD  cetirizine (ZYRTEC) 10 MG tablet Take 1 tablet (10 mg total) by mouth at bedtime. Patient not taking: Reported on 09/05/2017 09/24/13   Leone Haven, MD  Cholecalciferol 1000 units capsule Take 1 capsule (1,000 Units total) by mouth daily. Patient not taking: Reported on 09/05/2017 10/04/15   Vivi Barrack, MD  colchicine 0.6 MG tablet Take 1 tablet (0.6 mg total) by  mouth daily. 10/29/18   Matilde Haymaker, MD  fluticasone (FLONASE) 50 MCG/ACT nasal spray Place 1 spray into both nostrils daily. Patient not taking: Reported on 09/05/2017 05/14/13   Leone Haven, MD  glucose blood test strip 1 each by Other route daily. Use as instructed 01/26/14   Leone Haven, MD  irbesartan (AVAPRO) 150 MG tablet Take 1 tablet (150 mg total) by mouth daily. 09/11/18   Matilde Haymaker, MD  Lancets (FREESTYLE) lancets Use as instructed 11/27/13   Lupita Dawn, MD  metFORMIN (GLUCOPHAGE) 500 MG tablet Take 1 tablet (500 mg total) by mouth daily. 02/07/17   Diallo, Earna Coder, MD  naproxen (NAPROSYN) 250 MG tablet Take 1 tablet (250 mg total) by mouth 2 (two) times daily with a meal. 10/29/18   Matilde Haymaker, MD  omeprazole (PRILOSEC) 40 MG capsule TAKE 1 CAPSULE BY MOUTH ONCE DAILY 11/04/17   Diallo, Earna Coder, MD  rosuvastatin (CRESTOR) 10 MG tablet TAKE 1 TABLET BY MOUTH AT BEDTIME 07/29/18   Diallo, Abdoulaye, MD  triamcinolone ointment (KENALOG) 0.5 % APPLY OINTMENT TOPICALLY TO AFFECTED AREA TWICE DAILY FOR THE NEXT WEEK 08/29/18   Marjie Skiff, MD    Family History Family History  Problem Relation Age of Onset  . Heart attack Mother   . Heart failure Mother   . Colon cancer Neg Hx   . Esophageal cancer Neg Hx   . Rectal cancer Neg Hx   . Stomach cancer Neg Hx     Social History Social History   Tobacco Use  . Smoking status: Former Smoker    Packs/day: 1.50    Years: 10.00    Pack years: 15.00    Types: Cigarettes    Quit date: 11/06/1991    Years since quitting: 27.0  . Smokeless tobacco: Never Used  . Tobacco comment: quit 1993  Substance Use Topics  . Alcohol use: No  . Drug use: No     Allergies   Atorvastatin, Codeine, and Shellfish allergy   Review of Systems Review of Systems  Constitutional: Negative for fever.  HENT: Negative for nosebleeds.   Eyes: Negative for redness.  Respiratory: Negative for shortness of breath.    Cardiovascular: Negative for chest pain.  Gastrointestinal: Positive for blood in stool. Negative for abdominal pain.  Genitourinary: Negative for flank pain and hematuria.  Musculoskeletal: Negative for back pain and neck pain.  Skin: Negative for rash.  Neurological: Negative for headaches.  Hematological: Does not bruise/bleed easily.  Psychiatric/Behavioral: Negative for confusion.     Physical Exam Updated Vital Signs BP (!) 153/111 (BP Location: Left Arm)   Pulse 92   Temp 98.8 F (37.1 C) (Oral)   Resp 18   Ht 1.702 m (_0 )   Wt (!) 138 kg   SpO2 98%   BMI 47.65 kg/m   Physical Exam Vitals signs and nursing note reviewed.  Constitutional:      Appearance: Normal appearance. He is well-developed.  HENT:     Head: Atraumatic.     Nose: Nose normal.     Mouth/Throat:     Mouth: Mucous membranes are moist.     Pharynx: Oropharynx is clear.  Eyes:     General: No scleral icterus.    Conjunctiva/sclera: Conjunctivae normal.  Neck:     Musculoskeletal: Normal range of motion and neck supple. No neck rigidity.     Trachea: No tracheal deviation.  Cardiovascular:     Rate and Rhythm: Normal rate and regular rhythm.     Pulses: Normal pulses.     Heart sounds: Normal heart sounds. No murmur. No friction rub. No gallop.   Pulmonary:     Effort: Pulmonary effort is normal. No accessory muscle usage or respiratory distress.     Breath sounds: Normal breath sounds.  Abdominal:     General: Bowel sounds are normal. There is no distension.     Palpations: Abdomen is soft.     Tenderness: There is no abdominal tenderness. There is no guarding.     Comments: Obese.   Genitourinary:    Comments: No cva tenderness. Scant amount of stool, dark blood, heme positive.  Musculoskeletal:        General: No swelling.  Skin:    General: Skin is warm and dry.     Findings: No rash.  Neurological:     Mental Status: He is alert.     Comments: Alert, speech clear.    Psychiatric:        Mood and Affect: Mood normal.      ED Treatments / Results  Labs (all labs ordered are listed, but only abnormal results are displayed) Results for orders placed or performed during the hospital encounter of 11/08/18  Comprehensive metabolic panel  Result Value Ref Range   Sodium 138 135 - 145 mmol/L   Potassium 4.2 3.5 - 5.1 mmol/L   Chloride 105 98 - 111 mmol/L   CO2 24 22 - 32 mmol/L   Glucose, Bld 158 (H) 70 - 99 mg/dL   BUN 18 8 - 23 mg/dL   Creatinine, Ser 1.17 0.61 - 1.24 mg/dL   Calcium 9.3 8.9 - 10.3 mg/dL   Total Protein 7.1 6.5 - 8.1 g/dL   Albumin 3.8 3.5 - 5.0 g/dL   AST 31 15 - 41 U/L   ALT 32 0 - 44 U/L   Alkaline Phosphatase 70 38 - 126 U/L   Total Bilirubin 0.5 0.3 - 1.2 mg/dL   GFR calc non Af Amer >60 >60 mL/min   GFR calc Af Amer >60 >60 mL/min   Anion gap 9 5 - 15  CBC  Result Value Ref Range   WBC 9.3 4.0 - 10.5 K/uL   RBC 4.87 4.22 - 5.81 MIL/uL   Hemoglobin 12.8 (L) 13.0 - 17.0 g/dL   HCT 40.7 39.0 - 52.0 %   MCV 83.6 80.0 - 100.0 fL   MCH 26.3 26.0 - 34.0 pg   MCHC 31.4 30.0 - 36.0 g/dL   RDW 15.2 11.5 - 15.5 %   Platelets 330 150 - 400 K/uL   nRBC 0.0 0.0 - 0.2 %  POC occult blood, ED  Result Value Ref Range   Fecal Occult Bld POSITIVE (A) NEGATIVE  Type and screen [place]  Result Value Ref Range   ABO/RH(D) A POS    Antibody Screen NEG  Sample Expiration      11/11/2018,2359 Performed at [facility], [place] [address], [place], [place] [address]   ABO/Rh  Result Value Ref Range   ABO/RH(D)      A POS Performed at [facility] [address] [place], [place] [address]     EKG None  Radiology No results found.  Procedures Procedures (including critical care time)  Medications Ordered in ED Medications - No data to display   Initial Impression / Assessment and Plan / ED Course  I have reviewed the triage vital signs and the nursing notes.  Pertinent labs &  imaging results that were available during my care of the patient were reviewed by me and considered in my medical decision making (see chart for details).  Iv ns. Stat labs.   Reviewed nursing notes and prior charts for additional history.   Labs reviewed by me - hgb 12.8, mildly decreased from prior.   Given multiple episodes rectal bleeding, mild dec hgb, will admit to medicine for obs, serial hgb, expectant management.   [place] resident on call called for admission.    Final Clinical Impressions(s) / ED Diagnoses   Final diagnoses:  None    ED Discharge Orders    None       Lajean Saver, MD 11/08/18 2152

## 2018-11-08 NOTE — ED Notes (Signed)
ED TO INPATIENT HANDOFF REPORT  ED Nurse Name and Phone #: [phone]  S Name/Age/Gender Alex [name] 63 y.o. male Room/Bed: RESUSC/RESUSC  Code Status   Code Status: Prior  Home/SNF/Other Home Patient oriented to: self, place, time and situation Is this baseline? Yes   Triage Complete: Triage complete  Chief Complaint Blood in Stool  Triage Note Pt reports dark blood in loose stool x several episodes since 1300-1400 today, pt denies fever, denies shob. Low abd pain onset today.    Allergies Allergies  Allergen Reactions  . Atorvastatin Other (See Comments)    Subjective myalgias. Gave 2 trials, developed myalgias which resolved after cessation of medication.  Muscle cramps were 9/10 discomfort  . Codeine Nausea Only  . Shellfish Allergy Other (See Comments)    Activates asthma    Level of Care/Admitting Diagnosis ED Disposition    ED Disposition Condition Comment   Admit  Hospital Area: [facility] [[id number]]  Level of Care: Med-Surg [16]  Covid Evaluation: N/A  Diagnosis: Rectal bleeding [id number]  Admitting Physician: Rory Percy [id number]  Attending Physician: Leeanne Rio [phone]  Estimated length of stay: past midnight tomorrow  Certification:: I certify this patient will need inpatient services for at least 2 midnights  PT Class (Do Not Modify): Inpatient [101]  PT Acc Code (Do Not Modify): Private [1]       B Medical/Surgery History Past Medical History:  Diagnosis Date  . Asthma   . CAD (coronary artery disease)    Cardiac cath 2002 with 25% distal RCA stenosis.   . DDD (degenerative disc disease), lumbar   . Diabetes mellitus ([place])   . Esophageal stricture   . Gastritis 2010  . GERD (gastroesophageal reflux disease)   . Gout   . HTN (hypertension)   . Hyperlipidemia   . Insomnia   . MI (myocardial infarction) ([place]) 2009   no stent placement  . OA (osteoarthritis)   . Obesity   . OSA on CPAP   . Seasonal  allergies   . TIA (transient ischemic attack) 2013   Past Surgical History:  Procedure Laterality Date  . ESOPHAGOGASTRODUODENOSCOPY     multiple  . NASAL SINUS SURGERY    . VASECTOMY       A IV Location/Drains/Wounds Patient Lines/Drains/Airways Status   Active Line/Drains/Airways    Name:   Placement date:   Placement time:   Site:   Days:   Peripheral IV 11/08/18 Right Hand   11/08/18    2332    Hand   less than 1          Intake/Output Last 24 hours No intake or output data in the 24 hours ending 11/08/18 2337  Labs/Imaging Results for orders placed or performed during the hospital encounter of 11/08/18 (from the past 48 hour(s))  Type and screen [place]     Status: None   Collection Time: 11/08/18  7:31 PM  Result Value Ref Range   ABO/RH(D) A POS    Antibody Screen NEG    Sample Expiration      11/11/2018,2359 Performed at [facility], [place] [address], [place], [place] [address]   ABO/Rh     Status: None   Collection Time: 11/08/18  7:31 PM  Result Value Ref Range   ABO/RH(D)      A POS Performed at [facility] [address], [place], [place] [address]   Comprehensive metabolic panel  Status: Abnormal   Collection Time: 11/08/18  7:35 PM  Result Value Ref Range   Sodium 138 135 - 145 mmol/L   Potassium 4.2 3.5 - 5.1 mmol/L   Chloride 105 98 - 111 mmol/L   CO2 24 22 - 32 mmol/L   Glucose, Bld 158 (H) 70 - 99 mg/dL   BUN 18 8 - 23 mg/dL   Creatinine, Ser 1.17 0.61 - 1.24 mg/dL   Calcium 9.3 8.9 - 10.3 mg/dL   Total Protein 7.1 6.5 - 8.1 g/dL   Albumin 3.8 3.5 - 5.0 g/dL   AST 31 15 - 41 U/L   ALT 32 0 - 44 U/L   Alkaline Phosphatase 70 38 - 126 U/L   Total Bilirubin 0.5 0.3 - 1.2 mg/dL   GFR calc non Af Amer >60 >60 mL/min   GFR calc Af Amer >60 >60 mL/min   Anion gap 9 5 - 15    Comment: Performed at [facility], [address] [place], [place] [address]  CBC     Status: Abnormal   Collection Time:  11/08/18  7:35 PM  Result Value Ref Range   WBC 9.3 4.0 - 10.5 K/uL   RBC 4.87 4.22 - 5.81 MIL/uL   Hemoglobin 12.8 (L) 13.0 - 17.0 g/dL   HCT 40.7 39.0 - 52.0 %   MCV 83.6 80.0 - 100.0 fL   MCH 26.3 26.0 - 34.0 pg   MCHC 31.4 30.0 - 36.0 g/dL   RDW 15.2 11.5 - 15.5 %   Platelets 330 150 - 400 K/uL   nRBC 0.0 0.0 - 0.2 %    Comment: Performed at [facility], [place] [address] [place], [place] [address]  POC occult blood, ED     Status: Abnormal   Collection Time: 11/08/18  9:30 PM  Result Value Ref Range   Fecal Occult Bld POSITIVE (A) NEGATIVE   No results found.  Pending Labs [employer] (From admission, onward)    Start     Ordered   11/08/18 2228  SARS CORONAVIRUS 2 (TAT 6-12 HRS) Nasal Swab Aptima Multi Swab  (Asymptomatic/Tier 2 Patients Labs)  Once,   STAT    Question Answer Comment  Is this test for diagnosis or screening Screening   Symptomatic for COVID-19 as defined by CDC No   Hospitalized for COVID-19 No   Admitted to ICU for COVID-19 No   Previously tested for COVID-19 No   Resident in a congregate (group) care setting No   Employed in healthcare setting No      11/08/18 2228   Signed and Held  HIV antibody (Routine Testing)  Once,   R     Signed and Held   Signed and Held  Basic metabolic panel  Tomorrow morning,   R     Signed and Held   Signed and Held  CBC  Tomorrow morning,   R     Signed and Held   Signed and Held  Hemoglobin A1c  Once,   R     Signed and Held   Signed and Held  Urinalysis, Routine w reflex microscopic  Once,   R     Signed and Held          Vitals/Pain Today's Vitals   11/08/18 2230 11/08/18 2245 11/08/18 2300 11/08/18 2315  BP: 112/79 124/80 132/88 129/87  Pulse: 72 71 70 74  Resp:      Temp:  TempSrc:      SpO2: 96% 96% 96% 96%  Weight:      Height:      PainSc:        Isolation Precautions No active isolations  Medications Medications - No data to display  Mobility walks     Focused  Assessments abdominal cramping and bloody stools   R Recommendations: See Admitting Provider Note  Report given to:   Additional Notes: IV saline lock, NPO

## 2018-11-09 ENCOUNTER — Other Ambulatory Visit: Payer: Self-pay

## 2018-11-09 ENCOUNTER — Inpatient Hospital Stay (HOSPITAL_COMMUNITY): Payer: PPO

## 2018-11-09 DIAGNOSIS — E785 Hyperlipidemia, unspecified: Secondary | ICD-10-CM

## 2018-11-09 DIAGNOSIS — E119 Type 2 diabetes mellitus without complications: Secondary | ICD-10-CM

## 2018-11-09 DIAGNOSIS — I2510 Atherosclerotic heart disease of native coronary artery without angina pectoris: Secondary | ICD-10-CM

## 2018-11-09 DIAGNOSIS — D62 Acute posthemorrhagic anemia: Secondary | ICD-10-CM

## 2018-11-09 DIAGNOSIS — K625 Hemorrhage of anus and rectum: Secondary | ICD-10-CM

## 2018-11-09 DIAGNOSIS — Z6841 Body Mass Index (BMI) 40.0 and over, adult: Secondary | ICD-10-CM

## 2018-11-09 DIAGNOSIS — Z9989 Dependence on other enabling machines and devices: Secondary | ICD-10-CM

## 2018-11-09 DIAGNOSIS — K921 Melena: Secondary | ICD-10-CM

## 2018-11-09 DIAGNOSIS — I10 Essential (primary) hypertension: Secondary | ICD-10-CM

## 2018-11-09 DIAGNOSIS — G4733 Obstructive sleep apnea (adult) (pediatric): Secondary | ICD-10-CM

## 2018-11-09 LAB — SARS CORONAVIRUS 2 (TAT 6-24 HRS): SARS Coronavirus 2: NEGATIVE

## 2018-11-09 LAB — CBC
HCT: 36.4 % — ABNORMAL LOW (ref 39.0–52.0)
Hemoglobin: 11.5 g/dL — ABNORMAL LOW (ref 13.0–17.0)
MCH: 25.7 pg — ABNORMAL LOW (ref 26.0–34.0)
MCHC: 31.6 g/dL (ref 30.0–36.0)
MCV: 81.4 fL (ref 80.0–100.0)
Platelets: 245 10*3/uL (ref 150–400)
RBC: 4.47 MIL/uL (ref 4.22–5.81)
RDW: 15.1 % (ref 11.5–15.5)
WBC: 7.6 10*3/uL (ref 4.0–10.5)
nRBC: 0 % (ref 0.0–0.2)

## 2018-11-09 LAB — URINALYSIS, ROUTINE W REFLEX MICROSCOPIC
Bilirubin Urine: NEGATIVE
Glucose, UA: NEGATIVE mg/dL
Hgb urine dipstick: NEGATIVE
Ketones, ur: NEGATIVE mg/dL
Leukocytes,Ua: NEGATIVE
Nitrite: NEGATIVE
Protein, ur: NEGATIVE mg/dL
Specific Gravity, Urine: 1.015 (ref 1.005–1.030)
pH: 5 (ref 5.0–8.0)

## 2018-11-09 LAB — BASIC METABOLIC PANEL
Anion gap: 8 (ref 5–15)
BUN: 17 mg/dL (ref 8–23)
CO2: 23 mmol/L (ref 22–32)
Calcium: 8.7 mg/dL — ABNORMAL LOW (ref 8.9–10.3)
Chloride: 108 mmol/L (ref 98–111)
Creatinine, Ser: 1.05 mg/dL (ref 0.61–1.24)
GFR calc Af Amer: >60 mL/min (ref >60)
GFR calc non Af Amer: >60 mL/min (ref >60)
Glucose, Bld: 102 mg/dL — ABNORMAL HIGH (ref 70–99)
Potassium: 3.8 mmol/L (ref 3.5–5.1)
Sodium: 139 mmol/L (ref 135–145)

## 2018-11-09 LAB — HIV ANTIBODY (ROUTINE TESTING W REFLEX): HIV Screen 4th Generation wRfx: NONREACTIVE

## 2018-11-09 LAB — HEMOGLOBIN A1C
Hgb A1c MFr Bld: 6.3 % — ABNORMAL HIGH (ref 4.8–5.6)
Mean Plasma Glucose: 134.11 mg/dL

## 2018-11-09 LAB — HEMOGLOBIN AND HEMATOCRIT, BLOOD
HCT: 32 % — ABNORMAL LOW (ref 39.0–52.0)
HCT: 32.6 % — ABNORMAL LOW (ref 39.0–52.0)
Hemoglobin: 10.3 g/dL — ABNORMAL LOW (ref 13.0–17.0)
Hemoglobin: 10.3 g/dL — ABNORMAL LOW (ref 13.0–17.0)

## 2018-11-09 LAB — PREPARE RBC (CROSSMATCH)

## 2018-11-09 MED ORDER — ALLOPURINOL 100 MG PO TABS
100.0000 mg | ORAL_TABLET | Freq: Every day | ORAL | Status: DC
  Administered 2018-11-09 – 2018-11-10 (×2): 100 mg via ORAL
  Filled 2018-11-09 (×3): qty 1

## 2018-11-09 MED ORDER — SODIUM CHLORIDE 0.9 % IV SOLN
INTRAVENOUS | Status: DC
  Administered 2018-11-09 – 2018-11-10 (×4): via INTRAVENOUS

## 2018-11-09 MED ORDER — ACETAMINOPHEN 650 MG RE SUPP: 650.0000 mg | Freq: Four times a day (QID) | RECTAL | Status: DC | PRN

## 2018-11-09 MED ORDER — ONDANSETRON HCL 4 MG PO TABS: 4.0000 mg | ORAL_TABLET | Freq: Four times a day (QID) | ORAL | Status: DC | PRN

## 2018-11-09 MED ORDER — IRBESARTAN 150 MG PO TABS
150.0000 mg | ORAL_TABLET | Freq: Every day | ORAL | Status: DC
  Administered 2018-11-09 – 2018-11-10 (×2): 150 mg via ORAL
  Filled 2018-11-09 (×2): qty 1

## 2018-11-09 MED ORDER — PANTOPRAZOLE SODIUM 40 MG PO TBEC
80.0000 mg | DELAYED_RELEASE_TABLET | Freq: Every day | ORAL | Status: DC
  Administered 2018-11-09 – 2018-11-10 (×2): 80 mg via ORAL
  Filled 2018-11-09 (×3): qty 2

## 2018-11-09 MED ORDER — SODIUM CHLORIDE 0.9% IV SOLUTION: Freq: Once | INTRAVENOUS | Status: AC

## 2018-11-09 MED ORDER — ACETAMINOPHEN 325 MG PO TABS: 650.0000 mg | ORAL_TABLET | Freq: Four times a day (QID) | ORAL | Status: DC | PRN

## 2018-11-09 MED ORDER — ONDANSETRON HCL 4 MG/2ML IJ SOLN: 4.0000 mg | Freq: Four times a day (QID) | INTRAMUSCULAR | Status: DC | PRN

## 2018-11-09 MED ORDER — IOHEXOL 350 MG/ML SOLN
100.0000 mL | Freq: Once | INTRAVENOUS | Status: AC | PRN
  Administered 2018-11-09: 100 mL via INTRAVENOUS

## 2018-11-09 MED ORDER — CARVEDILOL 25 MG PO TABS
25.0000 mg | ORAL_TABLET | Freq: Two times a day (BID) | ORAL | Status: DC
  Administered 2018-11-09 – 2018-11-11 (×3): 25 mg via ORAL
  Filled 2018-11-09 (×5): qty 1

## 2018-11-09 MED ORDER — AMLODIPINE BESYLATE 10 MG PO TABS
10.0000 mg | ORAL_TABLET | Freq: Every day | ORAL | Status: DC
  Administered 2018-11-09 – 2018-11-10 (×2): 10 mg via ORAL
  Filled 2018-11-09 (×2): qty 1

## 2018-11-09 NOTE — Progress Notes (Signed)
RT offered pt CPAP dream station for the night and pt declined stating he did not wish to wear that he would be okay without it. RT expressed to pt that if he changes his mind to please have RN call. RT will continue to monitor.

## 2018-11-09 NOTE — Discharge Summary (Signed)
[facility] Discharge Summary  Patient name: Alex [name] Medical record number: [id number] Date of birth: [birth year] Age: 63 y.o. Gender: male Date of Admission: 11/08/2018  Date of Discharge: 11/11/18 Admitting Physician: Leeanne Rio, MD  Primary Care Provider: Matilde Haymaker, MD Consultants: GI   Indication for Hospitalization: rectal bleeding   Discharge Diagnoses/Problem List:  Active Problems:   Hypertension associated with diabetes [facility])   Rectal bleeding   Anemia   Diabetes mellitus without complication ([place])  Disposition: discharge home  Discharge Condition: stable  Discharge Exam:  General: Alert and cooperative and appears to be in no acute distress HEENT: Neck non-tender without lymphadenopathy, masses or thyromegaly Cardio: Normal S1 and S2, no S3 or S4. Rhythm is regular. No murmurs or rubs.   Pulm: Clear to auscultation bilaterally, no crackles, wheezing, or diminished breath sounds. Normal respiratory effort Abdomen: Bowel sounds normal. Abdomen soft and non-tender.  Extremities: No peripheral edema. Warm/ well perfused.  Strong radial and pedal pulses Neuro: Cranial nerves grossly intact  Brief Hospital Course:  [name] presented with acute onset of bloody stools and was found to have hemoglobin of 12.8 that continued to decrease to 9.1 throughout this admission. Patient with history of diverticular disease Gastroenterology was consulted and recommended preparation for blood transfusions and CTA followed by serial hemoglobin and hematocrit checks every 6 hours. CTA of the abdomen and pelvis did not reveal active bleeding.  Patient was kept on clear liquid diet and had colonoscopy which revealed a resolved diverticular bleed. They removed two polyps as well and recommended repeat colonoscopy in 7 years.  At the time of discharge, patient's hemoglobin was 9.2 and his vital signs were stable.  Issues for Follow Up:  1. Anemia in  setting of likely diverticular bleed. Would suggest obtaining CBC to monitor hemoglobin.  2. Patient has cardiovascular disease risk factors and h/o TIA, however,         is intolerant to atorvastatin. Consider alternatives.  Significant Procedures:  Colonoscopy:  - Diverticulosis in the sigmoid colon and in the descending colon. - One 3 mm polyp in the descending colon, removed with a cold snare. Resected and retrieved. - One 4 mm polyp in the ascending colon, removed with a cold snare. Resected and retrieved. - Non-bleeding internal hemorrhoids. - Suspect a resolved diverticular bleed as the cause of his hematochezia. Impression: - High fiber diet recommended. - Continue present medications. - Await pathology results. - Repeat colonoscopy in 7 years for surveillance. - Okay for discharge from GI perspective.    Significant Labs and Imaging:  Recent Labs  Lab 11/09/18 0328  11/10/18 0826 11/10/18 1951 11/11/18 0118  WBC 7.6  --  7.1  --  8.6  HGB 11.5*   < > 9.2* 10.6* 9.2*  HCT 36.4*   < > 28.8* 33.3* 29.7*  PLT 245  --  244  --  258   < > = values in this interval not displayed.   Recent Labs  Lab 11/08/18 1935 11/09/18 0328 11/10/18 0826  NA 138 139 138  K 4.2 3.8 3.7  CL 105 108 107  CO2 _0 GLUCOSE 158* 102* 129*  BUN _1 CREATININE 1.17 1.05 1.00  CALCIUM 9.3 8.7* 8.4*  ALKPHOS 70  --   --   AST 31  --   --   ALT 32  --   --   ALBUMIN 3.8  --   --  Results/Tests Pending at Time of Discharge:   Discharge Medications:  Allergies as of 11/11/2018      Reactions   Atorvastatin Other (See Comments)   Subjective myalgias. Gave 2 trials, developed myalgias which resolved after cessation of medication.  Muscle cramps were 9/10 discomfort   Codeine Nausea Only   Shellfish Allergy Other (See Comments)   Activates asthma      Medication List    STOP taking these medications   rosuvastatin 10 MG tablet Commonly known as: CRESTOR     TAKE  these medications   Accu-Chek Aviva Connect w/Device Kit 1 Device by Does not apply route daily.   acetaminophen 500 MG tablet Commonly known as: TYLENOL Take 500 mg by mouth every 6 (six) hours as needed for headache (pain).   allopurinol 100 MG tablet Commonly known as: ZYLOPRIM Take 1 tablet (100 mg total) by mouth daily.   amLODipine 10 MG tablet Commonly known as: NORVASC Take 1 tablet (10 mg total) by mouth daily. What changed: when to take this   aspirin EC 81 MG tablet Take 81 mg by mouth daily with supper.   carvedilol 25 MG tablet Commonly known as: COREG Take 1 tablet (25 mg total) by mouth 2 (two) times daily with a meal. What changed: when to take this   colchicine 0.6 MG tablet Take 1 tablet (0.6 mg total) by mouth daily. What changed:   when to take this  reasons to take this   freestyle lancets Use as instructed   Accu-Chek FastClix Lancets Misc 1 Device by Does not apply route daily.   glucose blood test strip 1 each by Other route daily. Use as instructed   irbesartan 150 MG tablet Commonly known as: AVAPRO Take 1 tablet (150 mg total) by mouth daily. What changed: when to take this   naproxen 500 MG tablet Commonly known as: NAPROSYN Take 500 mg by mouth daily as needed (pain).   omeprazole 40 MG capsule Commonly known as: PRILOSEC TAKE 1 CAPSULE BY MOUTH ONCE DAILY What changed: when to take this   triamcinolone ointment 0.5 % Commonly known as: KENALOG APPLY OINTMENT TOPICALLY TO AFFECTED AREA TWICE DAILY FOR THE NEXT WEEK What changed: See the new instructions.       Discharge Instructions: Please refer to Patient Instructions section of EMR for full details.  Patient was counseled important signs and symptoms that should prompt return to medical care, changes in medications, dietary instructions, activity restrictions, and follow up appointments.   Follow-Up Appointments: Follow-up Information    Lurline Del, DO. Go on  11/14/2018.   Specialty: Family Medicine Why: at 1:45 pm Contact information: [address] [place] [address] [phone]           Gladys Damme, MD 11/12/2018, 10:13 PM PGY-1, [place]

## 2018-11-09 NOTE — Progress Notes (Addendum)
Received patient from ED via wheelchair, AOx4, ambulatory, VSS, O2Sat at 98$ on RA, denies pain, oriented to room, bed controls, call light, and explained plan of care.  RT Wendi was informed that pt needed CPAP and responded that she will be on the unit to bring the CPAP machine.  Pt now resting on bed watching TV.  Will monitor.

## 2018-11-09 NOTE — Consult Note (Addendum)
Consultation  Referring Provider: Family Medicine/[place] Primary Care Physician:  Matilde Haymaker, MD Primary Gastroenterologist:  Jeanmarie Hubert  Reason for Consultation: Bloody stools  HPI: Alex [name] is a 63 y.o. male, who was admitted last night through the emergency room after he developed 3-4 episodes of loose bloody stool yesterday afternoon.  He had no complaint of abdominal pain, nausea vomiting or rectal pain.  No syncope or presyncope, no chest pain or shortness of breath. Patient is not anticoagulated.  Was hemodynamically stable on admission. Initial hemoglobin 12.8 which dropped to 11.5 early this a.m. COVID-19 negative BUN normal at 18  Patient has no prior history of GI bleeding.  He has continued to pass bloody stool since admission.  He says he has had 6 or 7 episodes since arrival to the floor.  Stool is all grossly bloody perhaps with some clots.  He says he feels a bit weak but has not developed any dizziness or lightheadedness. Patient had been taking some naproxen recently for gout symptoms generally only takes 1 a day.  He took it for 3 to 4 days in a row last week and believes he only took 1 dose earlier this week.  Patient has history of asthma, coronary artery disease, adult onset diabetes mellitus, chronic GERD and prior stricture, hypertension, hyperlipidemia, prior TIA and sleep apnea on CPAP.  Colonoscopy was done in 2014 per Dr. Carlean Purl with finding of severe left colon and sigmoid diverticulosis, internal and external hemorrhoids and 2 diminutive polyps were removed and by path found to be hyperplastic.   Past Medical History:  Diagnosis Date  . Asthma   . CAD (coronary artery disease)    Cardiac cath 2002 with 25% distal RCA stenosis.   . DDD (degenerative disc disease), lumbar   . Diabetes mellitus ([place])   . Esophageal stricture   . Gastritis 2010  . GERD (gastroesophageal reflux disease)   . Gout   . HTN (hypertension)   . Hyperlipidemia    . Insomnia   . MI (myocardial infarction) ([place]) 2009   no stent placement  . OA (osteoarthritis)   . Obesity   . OSA on CPAP   . Seasonal allergies   . TIA (transient ischemic attack) 2013    Past Surgical History:  Procedure Laterality Date  . ESOPHAGOGASTRODUODENOSCOPY     multiple  . NASAL SINUS SURGERY    . VASECTOMY      Prior to Admission medications   Medication Sig Start Date End Date Taking? Authorizing Provider  acetaminophen (TYLENOL) 500 MG tablet Take 500 mg by mouth every 6 (six) hours as needed for headache (pain).   Yes [provider]  allopurinol (ZYLOPRIM) 100 MG tablet Take 1 tablet (100 mg total) by mouth daily. 10/29/18  Yes Matilde Haymaker, MD  amLODipine (NORVASC) 10 MG tablet Take 1 tablet (10 mg total) by mouth daily. Patient taking differently: Take 10 mg by mouth daily with supper.  10/29/18  Yes Matilde Haymaker, MD  aspirin EC 81 MG tablet Take 81 mg by mouth daily with supper.   Yes [provider]  carvedilol (COREG) 25 MG tablet Take 1 tablet (25 mg total) by mouth 2 (two) times daily with a meal. Patient taking differently: Take 25 mg by mouth daily with supper.  07/29/17  Yes Diallo, Abdoulaye, MD  colchicine 0.6 MG tablet Take 1 tablet (0.6 mg total) by mouth daily. Patient taking differently: Take 0.6 mg by mouth daily as needed (gout flares).  10/29/18  Yes Matilde Haymaker, MD  irbesartan (AVAPRO) 150 MG tablet Take 1 tablet (150 mg total) by mouth daily. Patient taking differently: Take 150 mg by mouth daily with supper.  09/11/18  Yes Matilde Haymaker, MD  naproxen (NAPROSYN) 500 MG tablet Take 500 mg by mouth daily as needed (pain).   Yes [provider]  omeprazole (PRILOSEC) 40 MG capsule TAKE 1 CAPSULE BY MOUTH ONCE DAILY Patient taking differently: Take 40 mg by mouth daily with supper.  11/04/17  Yes Diallo, Abdoulaye, MD  triamcinolone ointment (KENALOG) 0.5 % APPLY OINTMENT TOPICALLY TO AFFECTED AREA TWICE DAILY FOR THE NEXT  WEEK Patient taking differently: Apply 1 application topically 2 (two) times daily as needed (eczema flares).  08/29/18  Yes Diallo, Earna Coder, MD  ACCU-CHEK FASTCLIX LANCETS MISC 1 Device by Does not apply route daily. 01/26/14   Leone Haven, MD  aspirin 81 MG chewable tablet Chew 1 tablet (81 mg total) by mouth daily. Patient not taking: Reported on 11/08/2018 09/24/13   Leone Haven, MD  Blood Glucose Monitoring Suppl (ACCU-CHEK AVIVA CONNECT) W/DEVICE KIT 1 Device by Does not apply route daily. 12/01/13   Leone Haven, MD  cetirizine (ZYRTEC) 10 MG tablet Take 1 tablet (10 mg total) by mouth at bedtime. Patient not taking: Reported on 09/05/2017 09/24/13   Leone Haven, MD  Cholecalciferol 1000 units capsule Take 1 capsule (1,000 Units total) by mouth daily. Patient not taking: Reported on 09/05/2017 10/04/15   Vivi Barrack, MD  fluticasone [facility]) 50 MCG/ACT nasal spray Place 1 spray into both nostrils daily. Patient not taking: Reported on 09/05/2017 05/14/13   Leone Haven, MD  glucose blood test strip 1 each by Other route daily. Use as instructed 01/26/14   Leone Haven, MD  Lancets (FREESTYLE) lancets Use as instructed 11/27/13   Lupita Dawn, MD  metFORMIN (GLUCOPHAGE) 500 MG tablet Take 1 tablet (500 mg total) by mouth daily. Patient not taking: Reported on 11/08/2018 02/07/17   Marjie Skiff, MD  naproxen (NAPROSYN) 250 MG tablet Take 1 tablet (250 mg total) by mouth 2 (two) times daily with a meal. Patient not taking: Reported on 11/08/2018 10/29/18   Matilde Haymaker, MD  rosuvastatin (CRESTOR) 10 MG tablet TAKE 1 TABLET BY MOUTH AT BEDTIME Patient not taking: Reported on 11/08/2018 07/29/18   Marjie Skiff, MD    Current Facility-Administered Medications  Medication Dose Route Frequency Provider Last Rate Last Dose  . 0.9 %  sodium chloride infusion   Intravenous Continuous Rory Percy, DO 150 mL/hr at 11/09/18 0709    . acetaminophen  (TYLENOL) tablet 650 mg  650 mg Oral Q6H PRN Rory Percy, DO       Or  . acetaminophen (TYLENOL) suppository 650 mg  650 mg Rectal Q6H PRN Rory Percy, DO      . allopurinol (ZYLOPRIM) tablet 100 mg  100 mg Oral Daily Rory Percy, DO   100 mg at 11/09/18 0943  . amLODipine (NORVASC) tablet 10 mg  10 mg Oral Q supper Rory Percy, DO      . carvedilol (COREG) tablet 25 mg  25 mg Oral BID WC Rumball, Alison, DO      . irbesartan (AVAPRO) tablet 150 mg  150 mg Oral Q supper Rory Percy, DO      . ondansetron (ZOFRAN) tablet 4 mg  4 mg Oral Q6H PRN Rory Percy, DO       Or  . ondansetron (ZOFRAN) injection  4 mg  4 mg Intravenous Q6H PRN Rory Percy, DO      . pantoprazole (PROTONIX) EC tablet 80 mg  80 mg Oral Daily Rumball, Bryson Ha, DO        Allergies as of 11/08/2018 - Review Complete 11/08/2018  Allergen Reaction Noted  . Atorvastatin Other (See Comments) 04/02/2012  . Codeine Nausea Only 02/23/2008  . Shellfish allergy Other (See Comments) 07/14/2013    Family History  Problem Relation Age of Onset  . Heart attack Mother   . Heart failure Mother   . Colon cancer Neg Hx   . Esophageal cancer Neg Hx   . Rectal cancer Neg Hx   . Stomach cancer Neg Hx     Social History   Socioeconomic History  . Marital status: Widowed    Spouse name: Not on file  . Number of children: 4  . Years of education: 75  . Highest education level: Not on file  Occupational History  . Occupation: Disability-truck driver  Social Needs  . Financial resource strain: Not on file  . Food insecurity    Worry: Not on file    Inability: Not on file  . Transportation needs    Medical: Not on file    Non-medical: Not on file  Tobacco Use  . Smoking status: Former Smoker    Packs/day: 1.50    Years: 10.00    Pack years: 15.00    Types: Cigarettes    Quit date: 11/06/1991    Years since quitting: 27.0  . Smokeless tobacco: Never Used  . Tobacco comment: quit 1993   Substance and Sexual Activity  . Alcohol use: No  . Drug use: No  . Sexual activity: Not on file  Lifestyle  . Physical activity    Days per week: Not on file    Minutes per session: Not on file  . Stress: Not on file  Relationships  . Social Herbalist on phone: Not on file    Gets together: Not on file    Attends religious service: Not on file    Active member of club or organization: Not on file    Attends meetings of clubs or organizations: Not on file    Relationship status: Not on file  . Intimate partner violence    Fear of current or ex partner: Not on file    Emotionally abused: Not on file    Physically abused: Not on file    Forced sexual activity: Not on file  Other Topics Concern  . Not on file  Social History Narrative   On disability   [employer]   Quit smoking 20 years ago (as of 01/2012)      Health Care POA:    Emergency Contact: brother, Mitzi Hansen [phone]   End of Life Plan: gave Ad pamphlet 5/15   Who lives with you: self   Any pets: none   Diet: pt has a varied diet of protein, starch and vegetables.   Exercise: pt does not have regular exercise routine.   Seatbelts: Pt reports wearing seatbelt when in vehicles.    Hobbies: plays bass and keyboard in church music group          Review of Systems: Pertinent positive and negative review of systems were noted in the above HPI section.  All other review of systems was otherwise negative.  Physical Exam: Vital signs in last 24 hours: Temp:  [97.6 F (36.4 C)-98.8 F (37.1 C)] 97.6  F (36.4 C) (08/30 0440) Pulse Rate:  [59-92] 72 (08/30 0821) Resp:  [16-19] 18 (08/30 0440) BP: (112-153)/(76-111) 128/76 (08/30 0440) SpO2:  [93 %-99 %] 99 % (08/30 0440) Weight:  [616 kg-138.3 kg] 138.3 kg (08/30 0012) Last BM Date: 11/09/18 General:   Alert,  Well-developed, well-nourished, African-American male pleasant and cooperative in NAD Head:  Normocephalic and atraumatic. Eyes:  Sclera clear, no  icterus.   Conjunctiva pink. Ears:  Normal auditory acuity. Nose:  No deformity, discharge,  or lesions. Mouth:  No deformity or lesions.   Neck:  Supple; no masses or thyromegaly. Lungs:  Clear throughout to auscultation.   No wheezes, crackles, or rhonchi. Heart:  Regular rate and rhythm; no murmurs, clicks, rubs,  or gallops. Abdomen:  Soft, obese, nontender, BS active,nonpalp mass or hsm.   Rectal:  Deferred Mar[place] stool previously documented Msk:  Symmetrical without gross deformities. . Pulses:  Normal pulses noted. Extremities:  Without clubbing or edema. Neurologic:  Alert and  oriented x4;  grossly normal neurologically. Skin:  Intact without significant lesions or rashes.. Psych:  Alert and cooperative. Normal mood and affect.  Intake/Output from previous day: 08/29 0701 - 08/30 0700 In: 818.6 [I.V.:818.6] Out: 450 [Urine:450] Intake/Output this shift: No intake/output data recorded.  Lab Results: Recent Labs    11/08/18 1935 11/09/18 0328  WBC 9.3 7.6  HGB 12.8* 11.5*  HCT 40.7 36.4*  PLT 330 245   BMET Recent Labs    11/08/18 1935 11/09/18 0328  NA 138 139  K 4.2 3.8  CL 105 108  CO2 24 23  GLUCOSE 158* 102*  BUN 18 17  CREATININE 1.17 1.05  CALCIUM 9.3 8.7*   LFT Recent Labs    11/08/18 1935  PROT 7.1  ALBUMIN 3.8  AST 31  ALT 32  ALKPHOS 70  BILITOT 0.5    IMPRESSION:  #18 63 year old African-American male with acute probable lower GI bleed onset yesterday afternoon with grossly bloody stool.  Patient continues to bleed today and has had 6 or 7 episodes of bloody stools.  Previously documented severe diverticulosis of the left and sigmoid colon. Suspect acute diverticular hemorrhage, cannot rule out other sources for lower GI bleed, i.e. angioectasia, occult lesion.  #2 anemia normocytic secondary to acute blood loss #3 chronic GERD-with history of stricture.  On chronic omeprazole #4 coronary artery disease, no stent #5 adult onset  diabetes mellitus #6 osteoarthritis and gout #7 hyperlipidemia #8 prior history of TIA  Plan:  n.p.o. this afternoon Stat hemoglobin now and every 6 hours.  Transfuse for hemoglobin 7.5 or less. Prepare packed RBCs Have ordered stat CT angio abdomen and pelvis for this afternoon.  If active bleed identified plan to proceed with embolization per IR  Continue IV fluids, place second IV. Bedrest with bedside commode Continue daily PPI  Thank you will follow with you   Amy Esterwood PA-C 11/09/2018, 11:46 AM     Attending Physician Note   I have taken a history, examined the patient and reviewed the chart. I agree with the Advanced Practitioner's note, impression and recommendations.   Acute LGI bleed likely secondary to known severe left colon diverticulosis. R/O AVM, neoplasm, etc. CTA urgently and if site found then IR angiogram. Iv fluids.   ABL anemia. Trend CBC. Transfusions to maintain Hb > 7 - 8.     Lucio Edward, MD [facility]astroenter[facility]

## 2018-11-09 NOTE — Progress Notes (Signed)
Patient had 2 episode of bloody stools around 0430 and 0600.  Patient is stable and ambulatory.

## 2018-11-10 DIAGNOSIS — D500 Iron deficiency anemia secondary to blood loss (chronic): Secondary | ICD-10-CM

## 2018-11-10 LAB — CBC
HCT: 28.8 % — ABNORMAL LOW (ref 39.0–52.0)
Hemoglobin: 9.2 g/dL — ABNORMAL LOW (ref 13.0–17.0)
MCH: 26.4 pg (ref 26.0–34.0)
MCHC: 31.9 g/dL (ref 30.0–36.0)
MCV: 82.5 fL (ref 80.0–100.0)
Platelets: 244 10*3/uL (ref 150–400)
RBC: 3.49 MIL/uL — ABNORMAL LOW (ref 4.22–5.81)
RDW: 15.4 % (ref 11.5–15.5)
WBC: 7.1 10*3/uL (ref 4.0–10.5)
nRBC: 0 % (ref 0.0–0.2)

## 2018-11-10 LAB — HEMOGLOBIN AND HEMATOCRIT, BLOOD
HCT: 28.1 % — ABNORMAL LOW (ref 39.0–52.0)
HCT: 33.3 % — ABNORMAL LOW (ref 39.0–52.0)
Hemoglobin: 9.1 g/dL — ABNORMAL LOW (ref 13.0–17.0)
Hemoglobin: 10.6 g/dL — ABNORMAL LOW (ref 13.0–17.0)

## 2018-11-10 LAB — BASIC METABOLIC PANEL
Anion gap: 9 (ref 5–15)
BUN: 9 mg/dL (ref 8–23)
CO2: 22 mmol/L (ref 22–32)
Calcium: 8.4 mg/dL — ABNORMAL LOW (ref 8.9–10.3)
Chloride: 107 mmol/L (ref 98–111)
Creatinine, Ser: 1 mg/dL (ref 0.61–1.24)
GFR calc Af Amer: >60 mL/min (ref >60)
GFR calc non Af Amer: >60 mL/min (ref >60)
Glucose, Bld: 129 mg/dL — ABNORMAL HIGH (ref 70–99)
Potassium: 3.7 mmol/L (ref 3.5–5.1)
Sodium: 138 mmol/L (ref 135–145)

## 2018-11-10 MED ORDER — PEG-KCL-NACL-NASULF-NA ASC-C 100 G PO SOLR
1.0000 | Freq: Once | ORAL | Status: DC
  Filled 2018-11-10: qty 1

## 2018-11-10 MED ORDER — PEG-KCL-NACL-NASULF-NA ASC-C 100 G PO SOLR
1.0000 | Freq: Once | ORAL | Status: AC
  Administered 2018-11-10: 200 g via ORAL
  Filled 2018-11-10: qty 1

## 2018-11-10 MED ORDER — BISACODYL 5 MG PO TBEC
20.0000 mg | DELAYED_RELEASE_TABLET | Freq: Once | ORAL | Status: AC
  Administered 2018-11-10: 20 mg via ORAL
  Filled 2018-11-10: qty 4

## 2018-11-10 NOTE — Progress Notes (Signed)
Patient refused CPAP tonight 

## 2018-11-10 NOTE — Progress Notes (Signed)
Progress Note    ASSESSMENT AND PLAN:    63 yo male with painless hematochezia and ABL anemia. No bleeding since last night around 11pm.  CTA negative. Hgb has stabilized ~ 9.2. Probably diverticular hemorrhage.  -Last colonoscoy 2014. No bleeding in 12 hours. If no further bleeding we could probably arrange for outpatient colonoscopy though patient says someone from [place] just came by and told him we were doing one tomorrow. He is fine either way. I will verify.  -Keep on clears for now.     SUBJECTIVE   No bleeding / no BMs since 11 pm last night. Feels okay.   OBJECTIVE:     Vital signs in last 24 hours: Temp:  [98.3 F (36.8 C)-98.4 F (36.9 C)] 98.4 F (36.9 C) (08/31 0520) Pulse Rate:  [63-82] 65 (08/31 0757) Resp:  [17-18] 17 (08/31 0520) BP: (120-136)/(80-95) 136/95 (08/31 0757) SpO2:  [98 %] 98 % (08/31 0520) Last BM Date: 11/09/18 General:   Alert, obese male in NAD EENT:  Normal hearing, non icteric sclera, conjunctive pink.  Heart:  Regular rate and rhythm;  No lower extremity edema   Pulm: Normal respiratory effort Abdomen:  Soft, nondistended, nontender.  Normal bowel sounds.  Neurologic:  Alert and  oriented x4;  grossly normal neurologically. Psych:  Pleasant, cooperative.  Normal mood and affect.   Intake/Output from previous day: 08/30 0701 - 08/31 0700 In: 2595.4 [P.O.:180; I.V.:2415.4] Out: -  Intake/Output this shift: Total I/O In: 720 [P.O.:720] Out: -   Lab Results: Recent Labs    11/08/18 1935 11/09/18 0328  11/09/18 1845 11/10/18 0046 11/10/18 0826  WBC 9.3 7.6  --   --   --  7.1  HGB 12.8* 11.5*   < > 10.3* 9.1* 9.2*  HCT 40.7 36.4*   < > 32.6* 28.1* 28.8*  PLT 330 245  --   --   --  244   < > = values in this interval not displayed.   BMET Recent Labs    11/08/18 1935 11/09/18 0328 11/10/18 0826  NA 138 139 138  K 4.2 3.8 3.7  CL 105 108 107  CO2 24 23 22   GLUCOSE 158* 102* 129*  BUN 18 17 9   CREATININE 1.17  1.05 1.00  CALCIUM 9.3 8.7* 8.4*   LFT Recent Labs    11/08/18 1935  PROT 7.1  ALBUMIN 3.8  AST 31  ALT 32  ALKPHOS 70  BILITOT 0.5   PT/INR No results for input(s): LABPROT, INR in the last 72 hours. Hepatitis Panel No results for input(s): HEPBSAG, HCVAB, HEPAIGM, HEPBIGM in the last 72 hours.  Ct Angio Abd/pel W/ And/or W/o  Result Date: 11/09/2018 CLINICAL DATA:  63 year old male with acute GI bleed EXAM: CTA ABDOMEN AND PELVIS wITHOUT AND WITH CONTRAST TECHNIQUE: Multidetector CT imaging of the abdomen and pelvis was performed using the standard protocol during bolus administration of intravenous contrast. Multiplanar reconstructed images and MIPs were obtained and reviewed to evaluate the vascular anatomy. CONTRAST:  15mL OMNIPAQUE IOHEXOL 350 MG/ML SOLN COMPARISON:  None. FINDINGS: VASCULAR Aorta: Normal in caliber. No significant atherosclerotic plaque. No aneurysm or dissection. Celiac: Conventional celiac anatomy. No evidence of aneurysm, dissection or stenosis. SMA: Patent without evidence of aneurysm, dissection, vasculitis or significant stenosis. Renals: Both renal arteries are patent without evidence of aneurysm, dissection, vasculitis, fibromuscular dysplasia or significant stenosis. IMA: Patent without evidence of aneurysm, dissection, vasculitis or significant stenosis. Inflow: Patent without evidence of aneurysm,  dissection, vasculitis or significant stenosis. Proximal Outflow: Bilateral common femoral and visualized portions of the superficial and profunda femoral arteries are patent without evidence of aneurysm, dissection, vasculitis or significant stenosis. Veins: No obvious venous abnormality within the limitations of this arterial phase study. Review of the MIP images confirms the above findings. NON-VASCULAR Lower chest: The lung bases are clear. Visualized cardiac structures are within normal limits for size. No pericardial effusion. Unremarkable visualized distal  thoracic esophagus. Hepatobiliary: Normal hepatic contour and morphology. No discrete hepatic lesion. Multiple calcified gallstones are visible in the gallbladder neck. No evidence of gallbladder distention, wall thickening or pericholecystic fluid. Pancreas: Unremarkable. No pancreatic ductal dilatation or surrounding inflammatory changes. Spleen: Normal in size without focal abnormality. Adrenals/Urinary Tract: Normal adrenal glands. No evidence of hydronephrosis or nephrolithiasis. Multiple circumscribed low-attenuation lesions without evidence of contrast enhancement are present bilaterally. On the right, there is a solitary 2.4 cm lesion in the lower pole which demonstrates some mildly increased density but no evidence of enhancement and likely represents a proteinaceous cyst. On the left, there are multiple cysts, the largest of which measures 5 cm exophytic from the lower pole. The ureters and bladder are unremarkable. Stomach/Bowel: Colonic diverticular disease without CT evidence of active inflammation. No evidence of focal bowel wall thickening or inflammation. No contrast is visualized within bowel lumen on either the arterial or delayed venous phase. Negative for acute GI bleeding at this time. Lymphatic: No suspicious lymphadenopathy. Reproductive: Prostate is unremarkable. Other: No abdominal wall hernia or abnormality. No abdominopelvic ascites. Musculoskeletal: No acute fracture or aggressive appearing lytic or blastic osseous lesion. IMPRESSION: 1. Negative for acute gastrointestinal bleeding at this time. 2. No significant arterial abnormality or atherosclerotic plaque. 3. Cholelithiasis without evidence of acute cholecystitis. 4. Colonic diverticular disease without CT evidence of active inflammation. 5. Bilateral renal cysts of varying complexity. Electronically Signed   By: Jacqulynn Cadet M.D.   On: 11/09/2018 15:01    Active Problems:   Rectal bleeding     LOS: 2 days   Tye Savoy ,NP 11/10/2018, 11:22 AM

## 2018-11-10 NOTE — Progress Notes (Addendum)
Family Medicine Teaching Service Daily Progress Note Intern Pager: [phone]  Patient name: Alex [name] Medical record number: [id number] Date of birth: January 02, 1956 Age: 63 y.o. Gender: male  Primary Care Provider: Matilde Haymaker, MD Consultants: GI Code Status: Full Code   Assessment and Plan: Alex [name] is a 63 y.o. male presenting with rectal bleeding. PMH is significant for type 2 diabetes, GERD, hypertension, CAD, HLD, OSA on CPAP, gout, OA, h/o TIA  Hematochezia Patient reports 1 bloody bowel movement overnight at 11 PM but feels that the bleeding has slowed and decreased in volume. On exam, patient has positive bowel sounds, is nontender to palpation and soft abdomen. GI consulted and recommending continuing IV Protonix. Patient has 2 PIVs in place. Hemoglobin at 9.2<9.1<10.3<10.3, continue to monitor hemoglobin q 6 hours. Threshold for transfusion at 7.5 or less. CTA without active bleeding. Will  Discuss with GI plan for colonoscopy in order to prepare for  -will recommend  -continue IV Protonix 80mg   -will repeat h/h q 12 hours instead of six  -will maintain clear liquid diet for now -Tylenol and Zofran PRN  -continue mIVFs at 170mL/hr until cessation of bleeding/stabilized hemoglobin for 24hrs   Type 2 Diabetes Reports PCP discontinued daily metformin 500mg  about 6 months ago as his diabetes has been historically well controlled. Reports a nurse came out to his home a few weeks ago and A1c at that time was "5 something." Last a1c per chart review 5.7 in 03/2018, currently elevated at 6.3. Glucose 129. - monitor glucose with BMP  GERD On omeprazole 40mg  daily, stable. No current sx of acid reflux. - protonix 80mg  per formulary. - monitor   HTN Normotensive on admission. On coreg 25mg  BID, irbesartan 150mg  daily, amlodipine 10mg  daily. - continue home meds  CAD Cath in 2002 with 25% stenosis in the distal RCA. Stress test in 2013 without evidence of ischemia. Last  ECHO 08/2017 EF 50-55% and G1DD. On ASA 81mg .   HLD Prescribed rosuvastatin 10mg  daily but reports he does not take this as it "makes him feel yucky." Per chart review had myalgias with atorvastatin. LDL 152 03/2018. - hold home statin - explore alternative options at PCP follow up  OSA on CPAP Dxed in 2014. Compliant with CPAP.  - CPAP qhs  Gout Takes allopurinol, colchicine prn. Had flare 2 weeks ago in L great MTP joint for which he also took naproxen. No current complaints. - monitor  Lumbar DDD, OA Most recent imaging lumbar XR with moderate facet disease in lumbar spine with preserved joint space. No current complaints.  - monitor  H/o TIA In 2013 (aphasia 3-5 minutes). On ASA 81mg . Intolerant to statin.   FEN/GI: NPO Prophylaxis: SCDs  Disposition: discharge pending stabilization of GI bleeding   Subjective:  She reports feeling well and has no abdominal pain.  Reports 1 bloody bowel movement overnight that seems to have decreased in blood volume.  Patient denies shortness of breath, dizziness, lightheadedness.  Objective: Temp:  [98.3 F (36.8 C)-98.4 F (36.9 C)] 98.4 F (36.9 C) (08/31 0520) Pulse Rate:  [63-82] 65 (08/31 0757) Resp:  [17-18] 17 (08/31 0520) BP: (120-136)/(80-95) 136/95 (08/31 0757) SpO2:  [98 %] 98 % (08/31 0520)  Physical Exam: General: Obese male appearing stated age in no acute distress sitting up in bed Cardiovascular: Regular rate and rhythm without murmurs, gallops or friction rubs Respiratory: Clear to auscultation bilaterally without wheezing or crackles Abdomen: BS abdomen nontender to palpation, positive bowel sounds, soft Extremities:  Trace bilateral lower extremity edema, moves extremities with normal range of motion  Laboratory: Recent Labs  Lab 11/08/18 1935 11/09/18 0328  11/09/18 1845 11/10/18 0046 11/10/18 0826  WBC 9.3 7.6  --   --   --  7.1  HGB 12.8* 11.5*   < > 10.3* 9.1* 9.2*  HCT 40.7 36.4*   < > 32.6*  28.1* 28.8*  PLT 330 245  --   --   --  244   < > = values in this interval not displayed.   Recent Labs  Lab 11/08/18 1935 11/09/18 0328 11/10/18 0826  NA 138 139 138  K 4.2 3.8 3.7  CL 105 108 107  CO2 24 23 22   BUN 18 17 9   CREATININE 1.17 1.05 1.00  CALCIUM 9.3 8.7* 8.4*  PROT 7.1  --   --   BILITOT 0.5  --   --   ALKPHOS 70  --   --   ALT 32  --   --   AST 31  --   --   GLUCOSE 158* 102* 129*    Imaging/Diagnostic Tests: CT Angio Abd and Pelvis  IMPRESSION: 1. Negative for acute gastrointestinal bleeding at this time. 2. No significant arterial abnormality or atherosclerotic plaque. 3. Cholelithiasis without evidence of acute cholecystitis. 4. Colonic diverticular disease without CT evidence of active inflammation. 5. Bilateral renal cysts of varying complexity.  Stark Klein, MD 11/10/2018, 9:53 AM PGY-1, [place] Intern pager: [phone], text pages welcome

## 2018-11-11 ENCOUNTER — Inpatient Hospital Stay (HOSPITAL_COMMUNITY): Payer: PPO | Admitting: Certified Registered Nurse Anesthetist

## 2018-11-11 ENCOUNTER — Encounter (HOSPITAL_COMMUNITY)
Admission: EM | Disposition: A | Discharge status: Home / Self Care | Payer: Self-pay | Source: Home / Self Care | Attending: Family Medicine

## 2018-11-11 ENCOUNTER — Encounter (HOSPITAL_COMMUNITY): Payer: Self-pay | Admitting: Emergency Medicine

## 2018-11-11 DIAGNOSIS — E119 Type 2 diabetes mellitus without complications: Secondary | ICD-10-CM

## 2018-11-11 DIAGNOSIS — K635 Polyp of colon: Secondary | ICD-10-CM

## 2018-11-11 DIAGNOSIS — D649 Anemia, unspecified: Secondary | ICD-10-CM

## 2018-11-11 DIAGNOSIS — E1159 Type 2 diabetes mellitus with other circulatory complications: Secondary | ICD-10-CM

## 2018-11-11 HISTORY — PX: POLYPECTOMY: SHX5525

## 2018-11-11 HISTORY — PX: COLONOSCOPY WITH PROPOFOL: SHX5780

## 2018-11-11 LAB — CBC
HCT: 29.7 % — ABNORMAL LOW (ref 39.0–52.0)
Hemoglobin: 9.2 g/dL — ABNORMAL LOW (ref 13.0–17.0)
MCH: 26 pg (ref 26.0–34.0)
MCHC: 31 g/dL (ref 30.0–36.0)
MCV: 83.9 fL (ref 80.0–100.0)
Platelets: 258 10*3/uL (ref 150–400)
RBC: 3.54 MIL/uL — ABNORMAL LOW (ref 4.22–5.81)
RDW: 15.2 % (ref 11.5–15.5)
WBC: 8.6 10*3/uL (ref 4.0–10.5)
nRBC: 0 % (ref 0.0–0.2)

## 2018-11-11 SURGERY — COLONOSCOPY WITH PROPOFOL
Anesthesia: Monitor Anesthesia Care

## 2018-11-11 MED ORDER — PROPOFOL 500 MG/50ML IV EMUL
INTRAVENOUS | Status: DC | PRN
  Administered 2018-11-11: 125 ug/kg/min via INTRAVENOUS

## 2018-11-11 MED ORDER — DEXMEDETOMIDINE HCL 200 MCG/2ML IV SOLN
INTRAVENOUS | Status: DC | PRN
  Administered 2018-11-11: 20 ug via INTRAVENOUS

## 2018-11-11 MED ORDER — PROPOFOL 10 MG/ML IV BOLUS
INTRAVENOUS | Status: DC | PRN
  Administered 2018-11-11: 20 mg via INTRAVENOUS

## 2018-11-11 MED ORDER — LIDOCAINE 2% (20 MG/ML) 5 ML SYRINGE
INTRAMUSCULAR | Status: DC | PRN
  Administered 2018-11-11: 40 mg via INTRAVENOUS

## 2018-11-11 SURGICAL SUPPLY — 22 items

## 2018-11-11 NOTE — Anesthesia Preprocedure Evaluation (Addendum)
Anesthesia Evaluation  Patient identified by MRN, date of birth, ID band Patient awake    Reviewed: Allergy & Precautions, NPO status , Patient's Chart, lab work & pertinent test results, reviewed documented beta blocker date and time   History of Anesthesia Complications Negative for: history of anesthetic complications  Airway Mallampati: II  TM Distance: >3 FB Neck ROM: Full    Dental  (+) Upper Dentures, Partial Lower   Pulmonary asthma , sleep apnea and Continuous Positive Airway Pressure Ventilation , former smoker,    Pulmonary exam normal        Cardiovascular hypertension, Pt. on medications and Pt. on home beta blockers + CAD, + Past MI (2009) and + Peripheral Vascular Disease  Normal cardiovascular exam  TTE 08/2017: EF 96-28%, grade 1 diastolic dysfunction, mild LAE    Neuro/Psych  Headaches, TIA (2013)   GI/Hepatic Neg liver ROS, GERD  Medicated and Controlled,  Endo/Other  diabetes, Type 2, Oral Hypoglycemic Agents  Renal/GU negative Renal ROS     Musculoskeletal  (+) Arthritis ,   Abdominal   Peds  Hematology negative hematology ROS (+)   Anesthesia Other Findings Day of surgery medications reviewed with the patient.  Reproductive/Obstetrics                            Anesthesia Physical Anesthesia Plan  ASA: III  Anesthesia Plan: MAC   Post-op Pain Management:    Induction:   PONV Risk Score and Plan: 1 and Treatment may vary due to age or medical condition and Propofol infusion  Airway Management Planned: Natural Airway and Simple Face Mask  Additional Equipment:   Intra-op Plan:   Post-operative Plan:   Informed Consent: I have reviewed the patients History and Physical, chart, labs and discussed the procedure including the risks, benefits and alternatives for the proposed anesthesia with the patient or authorized representative who has indicated his/her  understanding and acceptance.     Dental advisory given  Plan Discussed with: CRNA  Anesthesia Plan Comments:        Anesthesia Quick Evaluation

## 2018-11-11 NOTE — Plan of Care (Signed)
  Problem: Education: Goal: Knowledge of General Education information will improve Description Including pain rating scale, medication(s)/side effects and non-pharmacologic comfort measures Outcome: Progressing   Problem: Clinical Measurements: Goal: Respiratory complications will improve Outcome: Not Applicable   Problem: Activity: Goal: Risk for activity intolerance will decrease Outcome: Progressing   Problem: Elimination: Goal: Will not experience complications related to bowel motility Outcome: Progressing   

## 2018-11-11 NOTE — Transfer of Care (Signed)
Immediate Anesthesia Transfer of Care Note  Patient: Alex [name]  Procedure(s) Performed: COLONOSCOPY WITH PROPOFOL (N/A ) POLYPECTOMY  Patient Location: Endoscopy Unit  Anesthesia Type:MAC  Level of Consciousness: awake, alert  and oriented  Airway & Oxygen Therapy: Patient Spontanous Breathing  Post-op Assessment: Report given to RN and Post -op Vital signs reviewed and stable  Post vital signs: Reviewed and stable  Last Vitals:  Vitals Value Taken Time  BP    Temp    Pulse 74 11/11/18 1049  Resp 15 11/11/18 1049  SpO2 97 % 11/11/18 1049  Vitals shown include unvalidated device data.  Last Pain:  Vitals:   11/11/18 0924  TempSrc: Oral  PainSc: 0-No pain         Complications: No apparent anesthesia complications

## 2018-11-11 NOTE — Op Note (Signed)
[facility] Patient Name: Alex [name] Procedure Date : 11/11/2018 MRN: [id number] Attending MD: Thornton Park MD, MD Date of Birth: 1955-05-25 CSN: [id number] Age: 63 Admit Type: Inpatient Procedure:                Colonoscopy Indications:              Hematochezia                           Screening colonoscopy 2014 with Dr. Carlean Purl: severe                            left colon diverticulosis, internal and external                            hemorrhoids, and two hyperplastic polyps                           No known family history of colon cancer or polyps Providers:                Thornton Park MD, MD, Ashley Jacobs, RN, Cletis Athens, Technician, Edmonia James, CRNA Referring MD:              Medicines:                See the Anesthesia note for documentation of the                            administered medications Complications:            No immediate complications. Estimated blood loss:                            Minimal. Estimated Blood Loss:     Estimated blood loss was minimal. Procedure:                Pre-Anesthesia Assessment:                           - Prior to the procedure, a History and Physical                            was performed, and patient medications and                            allergies were reviewed. The patient's tolerance of                            previous anesthesia was also reviewed. The risks                            and benefits of the procedure and the sedation                            options and risks were discussed with  the patient.                            All questions were answered, and informed consent                            was obtained. Prior Anticoagulants: The patient has                            taken no previous anticoagulant or antiplatelet                            agents. ASA Grade Assessment: II - A patient with                            mild systemic disease.  After reviewing the risks                            and benefits, the patient was deemed in                            satisfactory condition to undergo the procedure.                           After obtaining informed consent, the colonoscope                            was passed under direct vision. Throughout the                            procedure, the patient's blood pressure, pulse, and                            oxygen saturations were monitored continuously. The                            [id number]) Olympus colonoscope was                            introduced through the anus and advanced to the the                            cecum, identified by appendiceal orifice and                            ileocecal valve. A second forward view of the right                            colon was performed. The colonoscopy was performed                            without difficulty. The patient tolerated the  procedure well. The quality of the bowel                            preparation was good. The ileocecal valve,                            appendiceal orifice, and rectum were photographed. Scope In: 10:27:36 AM Scope Out: 10:41:48 AM Scope Withdrawal Time: 0 hours 8 minutes 4 seconds  Total Procedure Duration: 0 hours 14 minutes 12 seconds  Findings:      The perianal and digital rectal examinations were normal.      Multiple small and large-mouthed diverticula were found in the sigmoid       colon and descending colon. No evidence for diverticulitis. No blood       present in the colon.      A 3 mm polyp was found in the descending colon. The polyp was sessile.       The polyp was removed with a cold snare. Resection and retrieval were       complete. Estimated blood loss was minimal.      A 4 mm polyp was found in the ascending colon. The polyp was sessile.       The polyp was removed with a cold snare. Resection and retrieval were       complete.  Estimated blood loss was minimal.      Non-bleeding internal hemorrhoids were found. The hemorrhoids were small. Impression:               - Diverticulosis in the sigmoid colon and in the                            descending colon.                           - One 3 mm polyp in the descending colon, removed                            with a cold snare. Resected and retrieved.                           - One 4 mm polyp in the ascending colon, removed                            with a cold snare. Resected and retrieved.                           - Non-bleeding internal hemorrhoids.                           - Suspect a resolved diverticular bleed as the                            cause of his hematochezia. Recommendation:           - High fiber diet recommended.                           -  Continue present medications.                           - Await pathology results.                           - Repeat colonoscopy in 7 years for surveillance if                            at least one of the removed polyps is an adenoma.                           - Okay for discharge from GI perspective. Procedure Code(s):        --- Professional ---                           [phone], Colonoscopy, flexible; with removal of                            tumor(s), polyp(s), or other lesion(s) by snare                            technique Diagnosis Code(s):        --- Professional ---                           K64.8, Other hemorrhoids                           K63.5, Polyp of colon                           K92.1, Melena (includes Hematochezia)                           K57.30, Diverticulosis of large intestine without                            perforation or abscess without bleeding CPT copyright 2019 American Medical Association. All rights reserved. The codes documented in this report are preliminary and upon coder review may  be revised to meet current compliance requirements. Thornton Park MD,  MD 11/11/2018 11:06:32 AM This report has been signed electronically. Number of Addenda: 0

## 2018-11-11 NOTE — Discharge Instructions (Signed)
It was a pleasure taking care of you! While in the hospital, you were treated for rectal bleeding, which was due to an outpouching in the colon called a diverticulum. The bleeding has stopped and you are now stable. Your GI doctor also removed 2 polyps and you can expect to hear from them regarding pathology report. You should repeat a colonoscopy in 7 years if no new problems arise.  1. Please restart your home medicines: you can restart Aspirin, and continue taking allopurinol, your blood pressure medications including Norvasc, Coreg, and Avapro; you may continue Protonix for acid reflux, and tylenol as needed for pain.  2. If bleeding restarts or you have symptoms of dizziness, shortness of breath, change in vision, fever (temperature > 100.23F, or <97.22F), tarry black stools, bright red bloody stools, or vomit that looks like coffee grounds, please seek immediate medical care.  3. Follow up with your primary care physician for routine care.  Be Well! Rectal Bleeding  Rectal bleeding is when blood passes out of the anus. People with rectal bleeding may notice bright red blood in their underwear or in the toilet after having a bowel movement. They may also have dark red or black stools. Rectal bleeding is usually a sign that something is wrong. Many things can cause rectal bleeding, including:  Hemorrhoids. These are blood vessels in the anus or rectum that are larger than normal.  Fistulas. These are abnormal passages in the rectum and anus.  Anal fissures. This is a tear in the anus.  Diverticulosis. This is a condition in which pockets or sacs project from the bowel.  Proctitis and colitis. These are conditions in which the rectum, colon, or anus become inflamed.  Polyps. These are growths that can be cancerous (malignant) or non-cancerous (benign).  Part of the rectum sticking out from the anus (rectal prolapse).  Certain medicines.  Intestinal infections. Follow these  instructions at home: Pay attention to any changes in your symptoms. Take these actions to help lessen bleeding and discomfort:  Eat a diet that is high in fiber. This will keep your stool soft, making it easier to pass stools without straining. Ask your health care provider what foods and drinks are high in fiber.  Drink enough fluid to keep your urine clear or pale yellow. This also helps to keep your stool soft.  Try taking a warm bath. This may help soothe any pain in your rectum.  Keep all follow-up visits as told by your health care provider. This is important. Get help right away if:  You have new or increased rectal bleeding.  You have black or dark red stools.  You vomit blood or something that looks like coffee grounds.  You have pain or tenderness in your abdomen.  You have a fever.  You feel weak.  You feel nauseous.  You faint.  You have severe pain in your rectum.  You cannot have a bowel movement. This information is not intended to replace advice given to you by your health care provider. Make sure you discuss any questions you have with your health care provider. Document Released: 08/18/2001 Document Revised: 10/20/2015 Document Reviewed: 04/24/2015 Elsevier Patient Education  2020 [employer].

## 2018-11-11 NOTE — Anesthesia Postprocedure Evaluation (Signed)
Anesthesia Post Note  Patient: DIESEL LINA  Procedure(s) Performed: COLONOSCOPY WITH PROPOFOL (N/A ) POLYPECTOMY     Patient location during evaluation: PACU Anesthesia Type: MAC Level of consciousness: awake and alert and oriented Pain management: pain level controlled Vital Signs Assessment: post-procedure vital signs reviewed and stable Respiratory status: spontaneous breathing, nonlabored ventilation and respiratory function stable Cardiovascular status: blood pressure returned to baseline Postop Assessment: no apparent nausea or vomiting Anesthetic complications: no    Last Vitals:  Vitals:   11/11/18 0440 11/11/18 0924  BP: 124/90 (!) 164/105  Pulse: 69 66  Resp: 16 20  Temp: 36.7 C 36.8 C  SpO2: 97% 98%    Last Pain:  Vitals:   11/11/18 0924  TempSrc: Oral  PainSc: 0-No pain                 Brennan Bailey

## 2018-11-11 NOTE — Progress Notes (Signed)
Family Medicine Teaching Service Daily Progress Note Intern Pager: [phone]  Patient name: Alex [name] Medical record number: [id number] Date of birth: 1955-05-25 Age: 63 y.o. Gender: male  Primary Care Provider: Matilde Haymaker, MD Consultants: GI Code Status: Full Code   Assessment and Plan:  Alex [name] is a 63 y.o. male presenting with rectal bleeding. PMH is significant for type 2 diabetes, GERD, hypertension, CAD, HLD, OSA on CPAP, gout, OA, h/o TIA  Hematochezia Patient reports "chunks of meat" in diarrhea from bowel prep, concerned it is sloughing of intestine.  On exam, patient has positive bowel sounds, is nontender to palpation and soft abdomen. GI consulted and recommending continuing IV Protonix. Patient has 2 PIVs in place. Hemoglobin at 9.2<9.1<10.3<10.3<9.2, continue to monitor hemoglobin q 12 hours. Threshold for transfusion at 7.5 or less. CTA without active bleeding. Going for colonoscopy w/ GI today. NPO since midnight.  -continue IV Protonix 80mg   -will repeat h/h q 12 hours  -NPO in preparation for colonoscopy -Tylenol and Zofran PRN  -continue mIVFs at 174mL/hr until cessation of bleeding/stabilized hemoglobin for 24hrs   Type 2 Diabetes Reports PCP discontinued daily metformin 500mg  about 6 months ago as his diabetes has been historically well controlled. Reports a nurse came out to his home a few weeks ago and A1c at that time was "5 something." Last a1c per chart review 5.7 in 03/2018, currently elevated at 6.3. Glucose 129 on 8/31. - monitor glucose with BMP  GERD On omeprazole 40mg  daily, stable. No current sx of acid reflux. - protonix 80mg  per formulary. - monitor   HTN Normotensive on admission. On coreg 25mg  BID, irbesartan 150mg  daily, amlodipine 10mg  daily. - continue home meds  CAD Cath in 2002 with 25% stenosis in the distal RCA. Stress test in 2013 without evidence of ischemia. Last ECHO 08/2017 EF 50-55% and G1DD. On ASA 81mg .    HLD Prescribed rosuvastatin 10mg  daily but reports he does not take this as it "makes him feel yucky." Per chart review had myalgias with atorvastatin. LDL 152 03/2018. - hold home statin - explore alternative options at PCP follow up  OSA on CPAP Dxed in 2014. Compliant with CPAP, although declined last night. - CPAP qhs  Gout Takes allopurinol, colchicine prn. Had flare 2 weeks ago in L great MTP joint for which he also took naproxen. No current complaints. - monitor  Lumbar DDD, OA Most recent imaging lumbar XR with moderate facet disease in lumbar spine with preserved joint space. No current complaints.  - monitor  H/o TIA In 2013 (aphasia 3-5 minutes). On ASA 81mg . Intolerant to statin.   FEN/GI: NPO Prophylaxis: SCDs  Disposition: discharge pending stabilization of GI bleeding   Subjective:  He reports feeling well and has no abdominal pain. He is a little anxious for the results of the colonoscopy. Reports "chunks of meat" in his diarrhea 2/2 bowel prep, concerned for intestinal sloughing.  Patient denies shortness of breath, dizziness, lightheadedness.  Objective: Temp:  [97.8 F (36.6 C)-98.1 F (36.7 C)] 98.1 F (36.7 C) (09/01 0440) Pulse Rate:  [62-76] 69 (09/01 0440) Resp:  [16-18] 16 (09/01 0440) BP: (124-137)/(88-98) 124/90 (09/01 0440) SpO2:  [97 %-100 %] 97 % (09/01 0440)  Physical Exam: General: Obese male appearing stated age in no acute distress sitting up in bed Cardiovascular: Regular rate and rhythm without murmurs, gallops or friction rubs Respiratory: Clear to auscultation bilaterally without wheezing or crackles Abdomen: BS abdomen nontender to palpation, positive bowel sounds, soft  Extremities: No edema, moves extremities with normal range of motion   Laboratory: Recent Labs  Lab 11/09/18 0328  11/10/18 0826 11/10/18 1951 11/11/18 0118  WBC 7.6  --  7.1  --  8.6  HGB 11.5*   < > 9.2* 10.6* 9.2*  HCT 36.4*   < > 28.8* 33.3*  29.7*  PLT 245  --  244  --  258   < > = values in this interval not displayed.   Recent Labs  Lab 11/08/18 1935 11/09/18 0328 11/10/18 0826  NA 138 139 138  K 4.2 3.8 3.7  CL 105 108 107  CO2 24 23 22   BUN 18 17 9   CREATININE 1.17 1.05 1.00  CALCIUM 9.3 8.7* 8.4*  PROT 7.1  --   --   BILITOT 0.5  --   --   ALKPHOS 70  --   --   ALT 32  --   --   AST 31  --   --   GLUCOSE 158* 102* 129*   Imaging/Diagnostic Tests:  CT Angio Abd and Pelvis  IMPRESSION: 1. Negative for acute gastrointestinal bleeding at this time. 2. No significant arterial abnormality or atherosclerotic plaque. 3. Cholelithiasis without evidence of acute cholecystitis. 4. Colonic diverticular disease without CT evidence of active inflammation. 5. Bilateral renal cysts of varying complexity.  Gladys Damme, MD 11/11/2018, 9:10 AM PGY-1, [facility] Intern pager: [phone], text pages welcome

## 2018-11-11 NOTE — Anesthesia Procedure Notes (Signed)
Procedure Name: MAC Performed by: Valda Favia, CRNA Pre-anesthesia Checklist: Patient identified, Emergency Drugs available, Suction available, Patient being monitored and Timeout performed Patient Re-evaluated:Patient Re-evaluated prior to induction Oxygen Delivery Method: Simple face mask Induction Type: IV induction Placement Confirmation: positive ETCO2 Dental Injury: Teeth and Oropharynx as per pre-operative assessment

## 2018-11-12 ENCOUNTER — Encounter: Payer: Self-pay | Admitting: Gastroenterology

## 2018-11-12 LAB — TYPE AND SCREEN
ABO/RH(D): A POS
Antibody Screen: NEGATIVE
Unit division: 0
Unit division: 0

## 2018-11-12 LAB — BPAM RBC
Blood Product Expiration Date: 202009202359
Blood Product Expiration Date: 202009202359
Unit Type and Rh: 6200
Unit Type and Rh: 6200

## 2018-11-13 DIAGNOSIS — G4733 Obstructive sleep apnea (adult) (pediatric): Secondary | ICD-10-CM | POA: Diagnosis not present

## 2018-11-14 ENCOUNTER — Other Ambulatory Visit: Payer: Self-pay

## 2018-11-14 ENCOUNTER — Ambulatory Visit (INDEPENDENT_AMBULATORY_CARE_PROVIDER_SITE_OTHER): Payer: Medicare HMO | Admitting: Family Medicine

## 2018-11-14 ENCOUNTER — Encounter: Payer: Self-pay | Admitting: Family Medicine

## 2018-11-14 VITALS — BP 130/82 | HR 85 | Ht 67.0 in | Wt 307.4 lb

## 2018-11-14 DIAGNOSIS — Z8719 Personal history of other diseases of the digestive system: Secondary | ICD-10-CM | POA: Diagnosis not present

## 2018-11-14 DIAGNOSIS — E785 Hyperlipidemia, unspecified: Secondary | ICD-10-CM | POA: Diagnosis not present

## 2018-11-14 NOTE — Assessment & Plan Note (Signed)
Plan: -Patient does not wish to attempt a new statin today though he does wish to discuss this further on future appointment.

## 2018-11-14 NOTE — Patient Instructions (Addendum)
It was great to see you!  Our plans for today:  - Checking a CBC today to monitor your blood counts - I want you to stay hydrated and eat plenty of high fiber foods in order to have 1-2 soft bowel movements per day - If you have trouble with this you can add a fiber supplement, and if this still doesn't not help please return or call your PCP to discuss further options - If you have any increasing stomach pains, blood in stool, chest pain that does not improve in 5-10 mins or feels like heavy pressure please go to the Emergency Department.  We are checking some labs today, we will call you or send you a letter if they are abnormal.   Take care and seek immediate care sooner if you develop any concerns.    Dr. [facility]

## 2018-11-14 NOTE — Assessment & Plan Note (Addendum)
63yo male who was recently hospitalized with diverticular bleed, s/p colonoscopy and polypectomy with no further complaints of rectal bleeding.  Plan:  1 - CBC recheck today 2 - Patient plans to increase hydration, high fiber foods such as vegetables in order to have 1-2 soft bowel movements per day. Patient plans to attempt to supplement fiber if needed in order to achieve this. 3 -Discussed with patient that the discomfort in his abdomen is likely related to gas pains from having the colonoscopy he has no pain on palpation and his discomfort only started after the colonoscopy.  He understands that if this is the case his abdominal discomfort should improve/resolve over the next day or 2.  Patient states he understands and will continue to monitor his abdominal discomfort with close follow-up if his pain worsens or does not improve over the next day or so.  Patient also advised to return to the emergency department if he notes any new rectal bleeding, worsening abdominal pain, vomiting, or fever.

## 2018-11-14 NOTE — Progress Notes (Signed)
Subjective:    Patient ID: Alex [name], male    DOB: 09-30-1955, 63 y.o.   MRN: [id number]   CC: Hospital Follow up  HPI:   Diverticular bleed: Patient recently hospitalized with diverticular bleed.  During his hospitalization his hemoglobin was noted to drop to 9.6.  Patient underwent a colonoscopy during his hospitalization and had 2 polyps removed.  Patient states that since this occured 3 days ago he has noticed some abdominal discomfort that is throughout his abdomen and of a sharp consistency.  Today this pain is a 4/10.  He denies pain being worse with palpation.  States that the pain does seem to be improved when he takes Tylenol and when eating.  He states that since his hospitalization he has been working to increase his hydration and fiber intake via a soft bowel movement per day the patient.  Patient denies fevers, shortness of breath, diaphoresis, diarrhea, constipation, rectal bleeding, blood in stool, melena.  Patient does admit to some chest pain that he has a "pulling" sensation that lasts for a few seconds and occurs 1-2 times per week.  He states this pain has occurred over the past 2 months or so and does not seem to be related to activity.  He denies having this pain during this office visit but states that he did have a few seconds of this earlier today.  He denies his chest discomfort being accompanied by any shortness of breath or diaphoresis.  Patient states he does not wish to have the flu shot but would be willing to reconsider this during his next visit.  Smoking status reviewed   ROS: pertinent noted in the HPI   Past medical history, surgical, family, and social history reviewed and updated in the EMR as appropriate.  Objective:  BP 130/82   Pulse 85   Ht 5\' 7"  (1.702 m)   Wt (!) 307 lb 6 oz (139.4 kg)   SpO2 97%   BMI 48.14 kg/m   Vitals and nursing note reviewed  General: NAD, pleasant, able to participate in exam Cardiac: RRR, no murmurs.  Respiratory: CTAB, normal effort, No wheezes, rales or rhonchi Extremities: no edema or cyanosis. Skin: warm and dry, no rashes noted Neuro: alert, no obvious focal deficits Abdominal: Bowel sounds present, no abdominal pain on light or deep palpation.   Assessment & Plan:    History of GI diverticular bleed 63yo male who was recently hospitalized with diverticular bleed, s/p colonoscopy and polypectomy with no further complaints of rectal bleeding.  Plan:  1 - CBC recheck today 2 - Patient plans to increase hydration, high fiber foods such as vegetables in order to have 1-2 soft bowel movements per day. Patient plans to attempt to supplement fiber if needed in order to achieve this. 3 -Discussed with patient that the discomfort in his abdomen is likely related to gas pains from having the colonoscopy he has no pain on palpation and his discomfort only started after the colonoscopy.  He understands that if this is the case his abdominal discomfort should improve/resolve over the next day or 2.  Patient states he understands and will continue to monitor his abdominal discomfort with close follow-up if his pain worsens or does not improve over the next day or so.  Patient also advised to return to the emergency department if he notes any new rectal bleeding, worsening abdominal pain, vomiting, or fever.  HLD (hyperlipidemia) Plan: -Patient does not wish to attempt a new statin today  though he does wish to discuss this further on future appointment.  - Return precautions given if patient experiences new rectal bleeding, chest pain/pressure that does not resolve in 5 minutes, shortness of breath, or new fever.   -Patient to monitor his chest discomfort closely, having a low threshold for going to the emergency department should this chest discomfort recur and not resolve within a few seconds and has has previously occurred, worsen, or be accompanied by shortness of breath or diaphoresis, or  activity.      Lurline Del, [facility] PGY-1

## 2018-11-15 LAB — CBC
Hematocrit: 27.2 % — ABNORMAL LOW (ref 37.5–51.0)
Hemoglobin: 9.1 g/dL — ABNORMAL LOW (ref 13.0–17.7)
MCH: 26.6 pg (ref 26.6–33.0)
MCHC: 33.5 g/dL (ref 31.5–35.7)
MCV: 80 fL (ref 79–97)
Platelets: 327 10*3/uL (ref 150–450)
RBC: 3.42 x10E6/uL — ABNORMAL LOW (ref 4.14–5.80)
RDW: 14.5 % (ref 11.6–15.4)
WBC: 7.8 10*3/uL (ref 3.4–10.8)

## 2018-11-18 ENCOUNTER — Telehealth: Payer: Self-pay | Admitting: Family Medicine

## 2018-11-18 NOTE — Telephone Encounter (Signed)
Called patient to inform of lab results. Patient's hemoglobin stable from time of discharge. Patient with no questions/complaints/concerns.

## 2018-11-28 DIAGNOSIS — G4733 Obstructive sleep apnea (adult) (pediatric): Secondary | ICD-10-CM | POA: Diagnosis not present

## 2018-12-02 ENCOUNTER — Other Ambulatory Visit: Payer: Self-pay

## 2018-12-02 MED ORDER — OMEPRAZOLE 40 MG PO CPDR: 40.0000 mg | DELAYED_RELEASE_CAPSULE | Freq: Every day | ORAL | 3 refills | Status: DC

## 2018-12-22 ENCOUNTER — Encounter: Payer: Self-pay | Admitting: Family Medicine

## 2018-12-22 ENCOUNTER — Ambulatory Visit (INDEPENDENT_AMBULATORY_CARE_PROVIDER_SITE_OTHER): Payer: Medicare HMO | Admitting: Family Medicine

## 2018-12-22 ENCOUNTER — Other Ambulatory Visit: Payer: Self-pay

## 2018-12-22 DIAGNOSIS — Z23 Encounter for immunization: Secondary | ICD-10-CM

## 2018-12-22 DIAGNOSIS — M5137 Other intervertebral disc degeneration, lumbosacral region: Secondary | ICD-10-CM | POA: Diagnosis not present

## 2018-12-22 DIAGNOSIS — E785 Hyperlipidemia, unspecified: Secondary | ICD-10-CM | POA: Diagnosis not present

## 2018-12-22 MED ORDER — ACETAMINOPHEN 500 MG PO TABS: 500.0000 mg | ORAL_TABLET | Freq: Four times a day (QID) | ORAL | 10 refills | Status: DC | PRN

## 2018-12-22 MED ORDER — NAPROXEN 500 MG PO TABS: 500.0000 mg | ORAL_TABLET | Freq: Every day | ORAL | 1 refills | Status: DC | PRN

## 2018-12-22 MED ORDER — ATORVASTATIN CALCIUM 20 MG PO TABS: 20.0000 mg | ORAL_TABLET | Freq: Every day | ORAL | 2 refills | Status: DC

## 2018-12-22 NOTE — Assessment & Plan Note (Signed)
-  Start atorvastatin 20 mg -If well tolerated, consider increase to high intensity range (80 mg)

## 2018-12-22 NOTE — Assessment & Plan Note (Signed)
No recent changes to the quality or severity of his pain.  He is encouraged to rely primarily on Tylenol for pain control.  He is informed that NSAIDs could be detrimental to his heart health.  We discussed the relationship with NSAIDs to myocardial infarction and heart failure in patients with a cardiovascular history.  Alex [name] is aware and understands the risks of using NSAIDs especially on a chronic basis.  We discussed using naproxen only as needed on days with particularly severe back pain. -Rely primarily on Tylenol 2-4 g/day -Conservative naproxen for particularly painful days -No further work-up at this time

## 2018-12-22 NOTE — Patient Instructions (Signed)
Is a quick review of the things we talked about:  Constipation: begin taking Metamucil every day.  Feel free to double and triple do not need to take until you are having comfortable, smooth bowel movements.  If you think that your bowel movements become too loose, begin decreasing your daily dose.  Back pain: Greater having any new symptoms.  I think it would be a good idea to switch from daily naproxen to daily Tylenol.  If you are taking the 500 mg tablets of Tylenol, you can take as many as 6-8/day.  I think you should try to reserve naproxen use for days with particularly bad pain.  Cholesterol: We started you on a new medication, atorvastatin today.  Keep an eye out for muscle pain in your thighs during the next couple weeks.  Come back to clinic in 3 months and we will reevaluate your cholesterol level.

## 2018-12-22 NOTE — Progress Notes (Signed)
    Subjective:  Alex [name] is a 63 y.o. male who presents to the [facility] today with a chief complaint of constipation.   HPI:  Back pain [name] reports he continues to experience ongoing back pain.  Suspect pain is been present for many years.  He denies any recent changes in quality or severity of his pain.  His pain is currently a dull throbbing along his left and right flank.  He denies any shooting pain to his legs, pelvic numbness, urinary incontinence, fecal incontinence.  Hard stools He reports that he has 1-2 bowel movements per day.  He is concerned because his bowel movements are hard and uncomfortable.  He is tried taking Metamucil at home.  This was more helpful initially but it is been less helpful recently now that he is taking it every other day.  This has provided some relief though he still does experience difficult bowel movements.  He is concerned because of his recent hospitalization for diverticulosis.  He denies any bloody stools at this time.  CAD/HLD [name] has a history of coronary artery disease in addition to elevated cholesterol.  He reports he is previously been put on rosuvastatin but it made him feel uncomfortable and was discontinued.  He is willing to try a different statin at this time.  Chief Complaint noted Review of Symptoms - see HPI PMH -recent hospitalization for diverticular bleed   Objective:  Physical Exam: BP 134/76   Pulse 90   Wt (!) 140.2 kg   SpO2 98%   BMI 48.40 kg/m    Gen: NAD, resting comfortably CV: RRR with no murmurs appreciated Pulm: NWOB, CTAB with no crackles, wheezes, or rhonchi GI: Normal bowel sounds present. Soft, Nontender, Nondistended. MSK: No step-offs on palpation of the spinous processes.  No significant tenderness to palpation of the spine.  No tenderness to palpation of the paraspinal muscles.  Mild tenderness to palpation of the left SI joint.  Straight leg raise negative bilaterally.  No results found  for this or any previous visit (from the past 72 hour(s)).   Assessment/Plan:  HLD (hyperlipidemia) -Start atorvastatin 20 mg -If well tolerated, consider increase to high intensity range (80 mg)  DEGENERATIVE DISC DISEASE, LUMBAR SPINE No recent changes to the quality or severity of his pain.  He is encouraged to rely primarily on Tylenol for pain control.  He is informed that NSAIDs could be detrimental to his heart health.  We discussed the relationship with NSAIDs to myocardial infarction and heart failure in patients with a cardiovascular history.  Alex [name] is aware and understands the risks of using NSAIDs especially on a chronic basis.  We discussed using naproxen only as needed on days with particularly severe back pain. -Rely primarily on Tylenol 2-4 g/day -Conservative naproxen for particularly painful days -No further work-up at this time  Hard stool He is encouraged to use Metamucil daily and to titrate his dose upward until he experiences soft, comfortable stools daily.

## 2018-12-28 DIAGNOSIS — G4733 Obstructive sleep apnea (adult) (pediatric): Secondary | ICD-10-CM | POA: Diagnosis not present

## 2019-01-05 ENCOUNTER — Other Ambulatory Visit: Payer: Self-pay | Admitting: Family Medicine

## 2019-01-28 DIAGNOSIS — G4733 Obstructive sleep apnea (adult) (pediatric): Secondary | ICD-10-CM | POA: Diagnosis not present

## 2019-02-02 ENCOUNTER — Other Ambulatory Visit: Payer: Self-pay

## 2019-02-02 MED ORDER — IRBESARTAN 150 MG PO TABS: 150.0000 mg | ORAL_TABLET | Freq: Every day | ORAL | 1 refills | Status: DC

## 2019-02-16 ENCOUNTER — Other Ambulatory Visit: Payer: Self-pay | Admitting: Family Medicine

## 2019-02-19 DIAGNOSIS — G4733 Obstructive sleep apnea (adult) (pediatric): Secondary | ICD-10-CM | POA: Diagnosis not present

## 2019-02-27 DIAGNOSIS — G4733 Obstructive sleep apnea (adult) (pediatric): Secondary | ICD-10-CM | POA: Diagnosis not present

## 2019-03-03 ENCOUNTER — Other Ambulatory Visit: Payer: Self-pay | Admitting: Family Medicine

## 2019-04-03 ENCOUNTER — Other Ambulatory Visit: Payer: Self-pay | Admitting: *Deleted

## 2019-04-03 MED ORDER — CARVEDILOL 25 MG PO TABS: 25.0000 mg | ORAL_TABLET | Freq: Every day | ORAL | 3 refills | Status: DC

## 2019-04-24 ENCOUNTER — Other Ambulatory Visit: Payer: Self-pay | Admitting: Family Medicine

## 2019-04-28 NOTE — Progress Notes (Signed)
Alex [name] was assessed earlier in the day at urgent care regarding his shortness of breath.  At the time of our encounter, he reported that he had no additional concerns at this time and will follow up with the clinic if any new issues arose.

## 2019-04-29 ENCOUNTER — Encounter: Payer: Self-pay | Admitting: Family Medicine

## 2019-04-29 ENCOUNTER — Other Ambulatory Visit: Payer: Self-pay

## 2019-04-29 ENCOUNTER — Telehealth (INDEPENDENT_AMBULATORY_CARE_PROVIDER_SITE_OTHER): Payer: PPO | Admitting: Family Medicine

## 2019-04-29 DIAGNOSIS — Z5329 Procedure and treatment not carried out because of patient's decision for other reasons: Secondary | ICD-10-CM

## 2019-04-29 DIAGNOSIS — R0602 Shortness of breath: Secondary | ICD-10-CM | POA: Diagnosis not present

## 2019-04-29 DIAGNOSIS — Z1152 Encounter for screening for COVID-19: Secondary | ICD-10-CM | POA: Diagnosis not present

## 2019-04-29 DIAGNOSIS — R519 Headache, unspecified: Secondary | ICD-10-CM | POA: Diagnosis not present

## 2019-05-18 ENCOUNTER — Encounter: Payer: Self-pay | Admitting: Family Medicine

## 2019-05-18 ENCOUNTER — Other Ambulatory Visit: Payer: Self-pay

## 2019-05-18 ENCOUNTER — Telehealth: Payer: PPO | Admitting: Family Medicine

## 2019-05-18 ENCOUNTER — Ambulatory Visit (INDEPENDENT_AMBULATORY_CARE_PROVIDER_SITE_OTHER): Payer: PPO | Admitting: Family Medicine

## 2019-05-18 VITALS — BP 124/78 | HR 87 | Wt 331.0 lb

## 2019-05-18 DIAGNOSIS — R3121 Asymptomatic microscopic hematuria: Secondary | ICD-10-CM | POA: Diagnosis not present

## 2019-05-18 DIAGNOSIS — R1031 Right lower quadrant pain: Secondary | ICD-10-CM

## 2019-05-18 DIAGNOSIS — R109 Unspecified abdominal pain: Secondary | ICD-10-CM

## 2019-05-18 DIAGNOSIS — R0609 Other forms of dyspnea: Secondary | ICD-10-CM

## 2019-05-18 DIAGNOSIS — R1032 Left lower quadrant pain: Secondary | ICD-10-CM | POA: Diagnosis not present

## 2019-05-18 DIAGNOSIS — R1030 Lower abdominal pain, unspecified: Secondary | ICD-10-CM | POA: Diagnosis not present

## 2019-05-18 DIAGNOSIS — R0600 Dyspnea, unspecified: Secondary | ICD-10-CM | POA: Diagnosis not present

## 2019-05-18 LAB — POCT URINALYSIS DIP (MANUAL ENTRY)
Bilirubin, UA: NEGATIVE
Glucose, UA: NEGATIVE mg/dL
Ketones, POC UA: NEGATIVE mg/dL
Leukocytes, UA: NEGATIVE
Nitrite, UA: NEGATIVE
Protein Ur, POC: NEGATIVE mg/dL
Spec Grav, UA: 1.01 (ref 1.010–1.025)
Urobilinogen, UA: 0.2 E.U./dL
pH, UA: 6 (ref 5.0–8.0)

## 2019-05-18 LAB — POCT UA - MICROSCOPIC ONLY

## 2019-05-18 NOTE — Progress Notes (Signed)
    SUBJECTIVE:   CHIEF COMPLAINT / HPI:   Patient is coming in after virtual medicine visit this a.m. for progressive symptoms of dyspnea and abdominal pain.  Dyspnea on exertion Patient states that history of a URI approximately 1 month ago.  He was Covid negative at that time although he had a rapid antigen test and not a PCR test.  This is shortness of breath walking from her room.  Denies chest pain, complications, weight gain, lower extremity swelling.  Has cough, hemoptysis, orthopnea.  Patient does have a CPAP machine that he uses at night.  Abdominal pain For the past few days patient has had abdominal discomfort in his inguinal area when urinating or defecating.  Denies any diarrhea or constipation.  Does have small blood while wiping.  Does not have any rectal pain.  Denies hematuria, fevers, chills.  She has no known history of hernia.  Father does have a history of hernia.  PERTINENT  PMH / PSH:   OBJECTIVE:   BP 124/78   Pulse 87   Wt (!) 331 lb (150.1 kg)   SpO2 99%   BMI 51.84 kg/m   Ambulatory pulse ox ranging from 93 to 94%  Gen: NAD, resting comfortably CV: RRR with no murmurs appreciated Pulm: NWOB, CTAB with no crackles, wheezes, or rhonchi GI: Large central pannus. Soft, Nontender, Nondistended. GU: Chaperoned with April.  No gross hernia.  Bilateral tenderness at inguinal ring. MSK: no edema, cyanosis, or clubbing noted Skin: warm, dry Neuro: grossly normal, moves all extremities Psych: Normal affect and thought content   ASSESSMENT/PLAN:   No problem-specific Assessment & Plan notes found for this encounter.     Bonnita Hollow, MD [place]

## 2019-05-18 NOTE — Assessment & Plan Note (Signed)
Without hypoxia.  History of diverticulosis with rectal bleeding, cannot rule out anemia.  History of diastolic heart failure with weight approximately 20 pounds year-over-year, although no gross signs of fluid overload on exam.  Recent URI symptoms, questionable long Covid?  Overall seems most likely consistent with OHS or undertreated OSA. -CBC -BMP -BNP -Follow-up in 1 week, may need further work-up with echocardiogram, repeat sleep study

## 2019-05-18 NOTE — Assessment & Plan Note (Signed)
Abdominal pain with bilateral inguinal pain.  Worse with increased intra-abdominal pressure such as defecating and urinating.  Although cannot visualize hernia, patient has multiple risk factors include family history, male sex, elevated BMI -CT abdomen pelvis with contrast -Return precautions discussed

## 2019-05-18 NOTE — Patient Instructions (Signed)
For shortness of breath, we are checking some blood work with your heart, check blood counts, check your kidney function and electrolytes.  Please follow-up with your regular doctor in 1 week if you having continued shortness of breath.  For your abdominal pain, we are getting a CT scan of your abdomen to look for possible hernia.  We are also checking your urine for infection.  We will call you with any results.

## 2019-05-18 NOTE — Progress Notes (Signed)
Patient with history of hematuria. Recommend follow up with urology. Will place referral.

## 2019-05-19 LAB — BASIC METABOLIC PANEL
BUN/Creatinine Ratio: 15 (ref 10–24)
BUN: 18 mg/dL (ref 8–27)
CO2: 23 mmol/L (ref 20–29)
Calcium: 9.2 mg/dL (ref 8.6–10.2)
Chloride: 100 mmol/L (ref 96–106)
Creatinine, Ser: 1.21 mg/dL (ref 0.76–1.27)
GFR calc Af Amer: 73 mL/min/{1.73_m2} (ref >59)
GFR calc non Af Amer: 63 mL/min/{1.73_m2} (ref >59)
Glucose: 83 mg/dL (ref 65–99)
Potassium: 4.9 mmol/L (ref 3.5–5.2)
Sodium: 137 mmol/L (ref 134–144)

## 2019-05-19 LAB — CBC
Hematocrit: 29.8 % — ABNORMAL LOW (ref 37.5–51.0)
Hemoglobin: 9.3 g/dL — ABNORMAL LOW (ref 13.0–17.7)
MCH: 22.2 pg — ABNORMAL LOW (ref 26.6–33.0)
MCHC: 31.2 g/dL — ABNORMAL LOW (ref 31.5–35.7)
MCV: 71 fL — ABNORMAL LOW (ref 79–97)
Platelets: 489 10*3/uL — ABNORMAL HIGH (ref 150–450)
RBC: 4.18 x10E6/uL (ref 4.14–5.80)
RDW: 17.4 % — ABNORMAL HIGH (ref 11.6–15.4)
WBC: 13.6 10*3/uL — ABNORMAL HIGH (ref 3.4–10.8)

## 2019-05-19 LAB — BRAIN NATRIURETIC PEPTIDE: BNP: 15.6 pg/mL (ref 0.0–100.0)

## 2019-05-20 DIAGNOSIS — G4733 Obstructive sleep apnea (adult) (pediatric): Secondary | ICD-10-CM | POA: Diagnosis not present

## 2019-05-21 ENCOUNTER — Telehealth: Payer: Self-pay | Admitting: Family Medicine

## 2019-05-21 DIAGNOSIS — D500 Iron deficiency anemia secondary to blood loss (chronic): Secondary | ICD-10-CM

## 2019-05-21 NOTE — Telephone Encounter (Signed)
Called patient. Stable microcytic anemia of 9.3. Possible cause of SOB. Recent colonoscopy negative for polyps, significant for diverticulosis. Anemia Likely due to iron deficiency from diverticulosis.  Will send Rx for iron. Remainder of labs stable. Of note, patient has mild hematuria with h/o complex, bilateral renal cyst. Referral to urology to assess for malignancy placed at last visit.

## 2019-05-22 ENCOUNTER — Other Ambulatory Visit: Payer: Self-pay | Admitting: Family Medicine

## 2019-05-22 DIAGNOSIS — H35013 Changes in retinal vascular appearance, bilateral: Secondary | ICD-10-CM | POA: Diagnosis not present

## 2019-05-22 DIAGNOSIS — H25013 Cortical age-related cataract, bilateral: Secondary | ICD-10-CM | POA: Diagnosis not present

## 2019-05-22 DIAGNOSIS — H2513 Age-related nuclear cataract, bilateral: Secondary | ICD-10-CM | POA: Diagnosis not present

## 2019-05-22 DIAGNOSIS — H524 Presbyopia: Secondary | ICD-10-CM | POA: Diagnosis not present

## 2019-05-22 DIAGNOSIS — D509 Iron deficiency anemia, unspecified: Secondary | ICD-10-CM

## 2019-05-22 LAB — HM DIABETES EYE EXAM

## 2019-05-22 MED ORDER — IRON 325 (65 FE) MG PO TABS: 1.0000 | ORAL_TABLET | Freq: Every morning | ORAL | 3 refills | Status: DC

## 2019-05-22 NOTE — Telephone Encounter (Signed)
Patient calls nurse line requesting rx be sent in for iron supplementation  To Dr. Grandville Silos.  Talbot Grumbling, RN

## 2019-05-25 NOTE — Progress Notes (Addendum)
    SUBJECTIVE:   CHIEF COMPLAINT / HPI:   DM Alex [name] reports that he is previously been diagnosed with diabetes and has previously taken Metformin but it was discontinued due to side effects and he did not start any different medication because his blood sugar seem to be well controlled.  A1c today 6.9.  Hyperlipidemia He has been on atorvastatin in the past is not currently taking it.  He cannot remember exactly why he stopped taking this medication.  Anemia Diagnosed with anemia based on CBC at recent visit.  No iron studies at this time.  He has not yet started taking his iron supplements. -iron  Heart failure No current chest pain, shortness of breath, orthopnea.  No current chest pain, orthopnea, shortness of breath.  HTN He does regularly take his irbesartan. -irbesartan  CAD No current chest pain.  Current medications include atorvastatin (does not typically take), aspirin, Coreg.   PERTINENT  PMH / PSH: History of TIA, MI, diastolic heart failure  OBJECTIVE:   BP 130/74   Pulse 89   Ht 5\' 7"  (1.702 m)   Wt (!) 321 lb (145.6 kg)   SpO2 99%   BMI 50.28 kg/m    Lower extremities: No evidence of lower extremity edema.  Diabetic foot exam as below Diabetic Foot Exam - Simple   Simple Foot Form Diabetic Foot exam was performed with the following findings: Yes 05/26/2019 10:35 AM  Visual Inspection No deformities, no ulcerations, no other skin breakdown bilaterally: Yes Sensation Testing Intact to touch and monofilament testing bilaterally: Yes Pulse Check Posterior Tibialis and Dorsalis pulse intact bilaterally: Yes Comments      ASSESSMENT/PLAN:   Diabetes type 2, controlled Is informed that his blood sugar appears poorly controlled today with an A1c increased to 6.9.  He is amenable to attempting restarting Metformin the extended release version which is usually better tolerated.  He is currently taking atorvastatin and an ARB. -Start Metformin XR 500  mg daily, plan to increase if well tolerated -Normal foot exam today -Ambulatory referral to ophthalmology for diabetic eye exam  HLD (hyperlipidemia) He was encouraged to restart his atorvastatin 40 mg.  He was informed that his history of heart attack and stroke make him high risk for recurrence of these issues and that atorvastatin is a good medication to help prevent future episodes.  He was understanding and amenable to taking his atorvastatin. -Continue atorvastatin 40 mg daily -Follow-up lipid panel  Anemia Hemoglobin stable since hospital discharge for diverticular bleed.  He has not been on any iron supplementation.  He had not yet picked up the previously ordered iron supplements.  He was encouraged today to pick up and begin taking iron supplements. He denies any blood in stool or abdominal pain. -Ferrous sulfate daily -Repeat CBC with differential to assess previously noted leukocytosis -Follow-up iron study to confirm iron deficiency anemia  Hypertension associated with diabetes ([place]) Stable.  Continue current medication.  No need for labs today.  Coronary artery disease No current complaints of chest pain or shortness of breath. -Continue aspirin, Coreg -Restart atorvastatin  Diastolic heart failure No lower extremity edema or evidence of chest pain or shortness of breath today.  No need for work-up or further treatment at this time.  We will continue to monitor symptoms.     Alex Haymaker, MD [place]

## 2019-05-26 ENCOUNTER — Ambulatory Visit (INDEPENDENT_AMBULATORY_CARE_PROVIDER_SITE_OTHER): Payer: PPO | Admitting: Family Medicine

## 2019-05-26 ENCOUNTER — Encounter: Payer: Self-pay | Admitting: Family Medicine

## 2019-05-26 ENCOUNTER — Other Ambulatory Visit: Payer: Self-pay

## 2019-05-26 VITALS — BP 130/74 | HR 89 | Ht 67.0 in | Wt 321.0 lb

## 2019-05-26 DIAGNOSIS — I2510 Atherosclerotic heart disease of native coronary artery without angina pectoris: Secondary | ICD-10-CM | POA: Diagnosis not present

## 2019-05-26 DIAGNOSIS — E119 Type 2 diabetes mellitus without complications: Secondary | ICD-10-CM

## 2019-05-26 DIAGNOSIS — R7309 Other abnormal glucose: Secondary | ICD-10-CM

## 2019-05-26 DIAGNOSIS — D72829 Elevated white blood cell count, unspecified: Secondary | ICD-10-CM

## 2019-05-26 DIAGNOSIS — Z23 Encounter for immunization: Secondary | ICD-10-CM

## 2019-05-26 DIAGNOSIS — I10 Essential (primary) hypertension: Secondary | ICD-10-CM

## 2019-05-26 DIAGNOSIS — E1159 Type 2 diabetes mellitus with other circulatory complications: Secondary | ICD-10-CM | POA: Diagnosis not present

## 2019-05-26 DIAGNOSIS — D509 Iron deficiency anemia, unspecified: Secondary | ICD-10-CM | POA: Diagnosis not present

## 2019-05-26 DIAGNOSIS — E785 Hyperlipidemia, unspecified: Secondary | ICD-10-CM | POA: Diagnosis not present

## 2019-05-26 DIAGNOSIS — I5032 Chronic diastolic (congestive) heart failure: Secondary | ICD-10-CM | POA: Diagnosis not present

## 2019-05-26 DIAGNOSIS — I152 Hypertension secondary to endocrine disorders: Secondary | ICD-10-CM

## 2019-05-26 LAB — POCT GLYCOSYLATED HEMOGLOBIN (HGB A1C): HbA1c, POC (controlled diabetic range): 6.9 % (ref 0.0–7.0)

## 2019-05-26 MED ORDER — TETANUS-DIPHTH-ACELL PERTUSSIS 5-2.5-18.5 LF-MCG/0.5 IM SUSP: 0.5000 mL | Freq: Once | INTRAMUSCULAR | 0 refills | Status: DC

## 2019-05-26 MED ORDER — METFORMIN HCL ER 500 MG PO TB24: 500.0000 mg | ORAL_TABLET | Freq: Every day | ORAL | 3 refills | Status: DC

## 2019-05-26 MED ORDER — ATORVASTATIN CALCIUM 20 MG PO TABS: 20.0000 mg | ORAL_TABLET | Freq: Every day | ORAL | 2 refills | Status: DC

## 2019-05-26 MED ORDER — IRON 325 (65 FE) MG PO TABS: 1.0000 | ORAL_TABLET | Freq: Every morning | ORAL | 3 refills | Status: DC

## 2019-05-26 NOTE — Addendum Note (Signed)
Addended by: Grant Ruts on: 05/26/2019 11:00 AM   Modules accepted: Orders

## 2019-05-26 NOTE — Patient Instructions (Signed)
Looks like your diabetes is getting a little bit worse.  Lets start Metformin XR 500 mg daily.  If you have trouble tolerating this due to stomach upset or loose stools, we will figure out another medication.  If you do well on this medication, we can increase your dose.  Anemia: For your low blood levels, start taking the iron supplements that are been sent to your pharmacy.  Cholesterol: Please also start taking your atorvastatin  Lets follow-up in 3 months to ensure the ear diabetes is well controlled.  I have also put in a referral to ophthalmology for you to have a diabetic eye exam.

## 2019-05-26 NOTE — Assessment & Plan Note (Addendum)
He was encouraged to restart his atorvastatin 40 mg.  He was informed that his history of heart attack and stroke make him high risk for recurrence of these issues and that atorvastatin is a good medication to help prevent future episodes.  He was understanding and amenable to taking his atorvastatin. -Continue atorvastatin 40 mg daily -Follow-up lipid panel

## 2019-05-26 NOTE — Assessment & Plan Note (Signed)
No current complaints of chest pain or shortness of breath. -Continue aspirin, Coreg -Restart atorvastatin

## 2019-05-26 NOTE — Assessment & Plan Note (Signed)
No lower extremity edema or evidence of chest pain or shortness of breath today.  No need for work-up or further treatment at this time.  We will continue to monitor symptoms.

## 2019-05-26 NOTE — Assessment & Plan Note (Signed)
Stable.  Continue current medication.  No need for labs today.

## 2019-05-26 NOTE — Assessment & Plan Note (Addendum)
Hemoglobin stable since hospital discharge for diverticular bleed.  He has not been on any iron supplementation.  He had not yet picked up the previously ordered iron supplements.  He was encouraged today to pick up and begin taking iron supplements. He denies any blood in stool or abdominal pain. -Ferrous sulfate daily -Repeat CBC with differential to assess previously noted leukocytosis -Follow-up iron study to confirm iron deficiency anemia

## 2019-05-26 NOTE — Assessment & Plan Note (Signed)
Is informed that his blood sugar appears poorly controlled today with an A1c increased to 6.9.  He is amenable to attempting restarting Metformin the extended release version which is usually better tolerated.  He is currently taking atorvastatin and an ARB. -Start Metformin XR 500 mg daily, plan to increase if well tolerated -Normal foot exam today -Ambulatory referral to ophthalmology for diabetic eye exam

## 2019-05-26 NOTE — Assessment & Plan Note (Signed)
>>  ASSESSMENT AND PLAN FOR DIABETES TYPE 2, CONTROLLED WRITTEN ON 05/26/2019 10:37 AM BY Matilde Haymaker, MD  Is informed that his blood sugar appears poorly controlled today with an A1c increased to 6.9.  He is amenable to attempting restarting Metformin the extended release version which is usually better tolerated.  He is currently taking atorvastatin and an ARB. -Start Metformin XR 500 mg daily, plan to increase if well tolerated -Normal foot exam today -Ambulatory referral to ophthalmology for diabetic eye exam

## 2019-05-27 ENCOUNTER — Ambulatory Visit (HOSPITAL_COMMUNITY)
Admission: RE | Admit: 2019-05-27 | Discharge: 2019-05-27 | Disposition: A | Discharge status: Home / Self Care | Payer: PPO | Source: Ambulatory Visit | Attending: Family Medicine | Admitting: Family Medicine

## 2019-05-27 DIAGNOSIS — R1030 Lower abdominal pain, unspecified: Secondary | ICD-10-CM | POA: Diagnosis not present

## 2019-05-27 DIAGNOSIS — R109 Unspecified abdominal pain: Secondary | ICD-10-CM | POA: Diagnosis not present

## 2019-05-27 LAB — LIPID PANEL
Chol/HDL Ratio: 6.1 ratio — ABNORMAL HIGH (ref 0.0–5.0)
Cholesterol, Total: 194 mg/dL (ref 100–199)
HDL: 32 mg/dL — ABNORMAL LOW (ref >39)
LDL Chol Calc (NIH): 139 mg/dL — ABNORMAL HIGH (ref 0–99)
Triglycerides: 127 mg/dL (ref 0–149)
VLDL Cholesterol Cal: 23 mg/dL (ref 5–40)

## 2019-05-27 LAB — CBC WITH DIFFERENTIAL/PLATELET
Basophils Absolute: 0.1 10*3/uL (ref 0.0–0.2)
Basos: 1 %
EOS (ABSOLUTE): 0.4 10*3/uL (ref 0.0–0.4)
Eos: 3 %
Hematocrit: 31.1 % — ABNORMAL LOW (ref 37.5–51.0)
Hemoglobin: 9.7 g/dL — ABNORMAL LOW (ref 13.0–17.7)
Immature Grans (Abs): 0.1 10*3/uL (ref 0.0–0.1)
Immature Granulocytes: 1 %
Lymphocytes Absolute: 2.5 10*3/uL (ref 0.7–3.1)
Lymphs: 18 %
MCH: 21.9 pg — ABNORMAL LOW (ref 26.6–33.0)
MCHC: 31.2 g/dL — ABNORMAL LOW (ref 31.5–35.7)
MCV: 70 fL — ABNORMAL LOW (ref 79–97)
Monocytes Absolute: 0.9 10*3/uL (ref 0.1–0.9)
Monocytes: 7 %
Neutrophils Absolute: 9.9 10*3/uL — ABNORMAL HIGH (ref 1.4–7.0)
Neutrophils: 70 %
Platelets: 626 10*3/uL — ABNORMAL HIGH (ref 150–450)
RBC: 4.42 x10E6/uL (ref 4.14–5.80)
RDW: 17.3 % — ABNORMAL HIGH (ref 11.6–15.4)
WBC: 13.9 10*3/uL — ABNORMAL HIGH (ref 3.4–10.8)

## 2019-05-27 LAB — IRON: Iron: 21 ug/dL — ABNORMAL LOW (ref 38–169)

## 2019-05-27 MED ORDER — IOHEXOL 300 MG/ML  SOLN
100.0000 mL | Freq: Once | INTRAMUSCULAR | Status: AC | PRN
  Administered 2019-05-27: 100 mL via INTRAVENOUS

## 2019-05-28 ENCOUNTER — Other Ambulatory Visit: Payer: Self-pay | Admitting: Family Medicine

## 2019-05-28 DIAGNOSIS — K5792 Diverticulitis of intestine, part unspecified, without perforation or abscess without bleeding: Secondary | ICD-10-CM

## 2019-05-28 MED ORDER — AMOXICILLIN-POT CLAVULANATE 875-125 MG PO TABS: 1.0000 | ORAL_TABLET | Freq: Two times a day (BID) | ORAL | 0 refills | Status: AC

## 2019-05-28 NOTE — Progress Notes (Signed)
Patient with mild intermittent abdominal pain. CT scan obtained looking for hernia. Findings show acute diverticulitis vs. Colonic neoplasm. Plan to treat for acute diverticulitis and refer to GI for possible colonoscopy. Called patient and informed. He is doing well with only mild intermittent abdominal pain. Return precautions discussed. Will also forward to PCP.

## 2019-06-11 ENCOUNTER — Encounter: Payer: Self-pay | Admitting: Family Medicine

## 2019-06-11 ENCOUNTER — Ambulatory Visit (INDEPENDENT_AMBULATORY_CARE_PROVIDER_SITE_OTHER): Payer: PPO | Admitting: Family Medicine

## 2019-06-11 ENCOUNTER — Other Ambulatory Visit: Payer: Self-pay

## 2019-06-11 VITALS — BP 130/74 | HR 62 | Ht 67.0 in | Wt 320.4 lb

## 2019-06-11 DIAGNOSIS — G4733 Obstructive sleep apnea (adult) (pediatric): Secondary | ICD-10-CM | POA: Diagnosis not present

## 2019-06-11 DIAGNOSIS — D508 Other iron deficiency anemias: Secondary | ICD-10-CM | POA: Diagnosis not present

## 2019-06-11 DIAGNOSIS — Z8719 Personal history of other diseases of the digestive system: Secondary | ICD-10-CM | POA: Diagnosis not present

## 2019-06-11 DIAGNOSIS — K5792 Diverticulitis of intestine, part unspecified, without perforation or abscess without bleeding: Secondary | ICD-10-CM | POA: Diagnosis not present

## 2019-06-11 NOTE — Assessment & Plan Note (Signed)
Secondary to his GI bleed earlier this year.  He has been taking iron supplements daily for at least the past 2 weeks.  Will monitor hemoglobin on today's lab draw although I do not expect significant improvement until his next blood work in several months. -Follow-up CBC

## 2019-06-11 NOTE — Progress Notes (Signed)
    SUBJECTIVE:   CHIEF COMPLAINT / HPI:   Diverticulosis Recent CT abdomen showed evidence of stranding around the bowels of the sigmoid colon.  This consistent with his last clinical exam during which she had some left-sided abdominal tenderness.  Was given a course of antibiotics.  Today he feels significantly improved he has no more abdominal tenderness his bowel movements are entirely normal and have no blood.  Anemia of iron deficiency He was hospitalized with a GI bleed earlier this year.  Since that time, has had hemoglobins consistently in the nines.  Today he feels that he has a normal energy level.  He has been taking his iron supplements consistently for the past 2 weeks.  He denies blood in his stool, blood in his urine, vomiting blood.  Obstructive sleep apnea Mr. Gegg has previously been diagnosed with obstructive sleep apnea.  He has been using a CPAP machine for at least the past 5 years.  Several months ago, he was given a new CPAP machine and was told that he needs to follow-up with his doctor to ensure there is still medically necessary.  He reports that since starting his use of CPAP, he has experienced significant improvement in his daytime sleepiness.  PERTINENT  PMH / PSH: See HPI  OBJECTIVE:   BP 130/74   Pulse 62   Ht 5\' 7"  (1.702 m)   Wt (!) 320 lb 6.4 oz (145.3 kg)   SpO2 100%   BMI 50.18 kg/m    General: Alert and cooperative and appears to be in no acute distress HEENT: Mild conjunctival pallor. Cardio: Distant heart sounds.  Regular rate and rhythm. Pulm: Clear to auscultation bilaterally, no crackles, wheezing, or diminished breath sounds. Normal respiratory effort  Extremities: No peripheral edema. Warm/ well perfused.  Well-perfused palmar creases.    ASSESSMENT/PLAN:   OSA (obstructive sleep apnea) He was diagnosed with obstructive sleep apnea in 2014 and has been using CPAP since that time.  He uses CPAP every night.  He has experienced  significant improvement in his symptoms.  CPAP is medically necessary for Mr. Heis as it subjectively improves his symptoms of OSA and will also contribute to the improvement of his hypertension and heart failure.  Anemia Secondary to his GI bleed earlier this year.  He has been taking iron supplements daily for at least the past 2 weeks.  Will monitor hemoglobin on today's lab draw although I do not expect significant improvement until his next blood work in several months. -Follow-up CBC  Diverticulitis His symptoms appear to have entirely resolved.  The etiology and treatment of diverticulitis was discussed.  No further work-up or treatment at this time.     Matilde Haymaker, MD [place]

## 2019-06-11 NOTE — Patient Instructions (Signed)
It was great to see you today Alex [name].  I will make sure to write clearly in my note that you need to use CPAP every night.  I will let you know if there is anything concerning from the blood work that we draw today.  Please return to clinic in 3 months for diabetes follow-up and to check your blood levels again.

## 2019-06-11 NOTE — Assessment & Plan Note (Signed)
His symptoms appear to have entirely resolved.  The etiology and treatment of diverticulitis was discussed.  No further work-up or treatment at this time.

## 2019-06-11 NOTE — Assessment & Plan Note (Signed)
He was diagnosed with obstructive sleep apnea in 2014 and has been using CPAP since that time.  He uses CPAP every night.  He has experienced significant improvement in his symptoms.  CPAP is medically necessary for Alex [name] as it subjectively improves his symptoms of OSA and will also contribute to the improvement of his hypertension and heart failure.

## 2019-06-12 LAB — CBC
Hematocrit: 31.5 % — ABNORMAL LOW (ref 37.5–51.0)
Hemoglobin: 10 g/dL — ABNORMAL LOW (ref 13.0–17.7)
MCH: 23.4 pg — ABNORMAL LOW (ref 26.6–33.0)
MCHC: 31.7 g/dL (ref 31.5–35.7)
MCV: 74 fL — ABNORMAL LOW (ref 79–97)
Platelets: 298 10*3/uL (ref 150–450)
RBC: 4.27 x10E6/uL (ref 4.14–5.80)
RDW: 20.3 % — ABNORMAL HIGH (ref 11.6–15.4)
WBC: 7.1 10*3/uL (ref 3.4–10.8)

## 2019-06-13 ENCOUNTER — Encounter: Payer: Self-pay | Admitting: Family Medicine

## 2019-06-16 ENCOUNTER — Other Ambulatory Visit: Payer: Self-pay | Admitting: Family Medicine

## 2019-06-20 ENCOUNTER — Other Ambulatory Visit: Payer: Self-pay

## 2019-06-20 ENCOUNTER — Encounter (HOSPITAL_COMMUNITY): Payer: Self-pay | Admitting: Emergency Medicine

## 2019-06-20 ENCOUNTER — Emergency Department (HOSPITAL_COMMUNITY)
Admission: EM | Admit: 2019-06-20 | Discharge: 2019-06-20 | Disposition: A | Discharge status: Home / Self Care | Payer: PPO | Attending: Emergency Medicine | Admitting: Emergency Medicine

## 2019-06-20 DIAGNOSIS — Z7982 Long term (current) use of aspirin: Secondary | ICD-10-CM | POA: Insufficient documentation

## 2019-06-20 DIAGNOSIS — Z8673 Personal history of transient ischemic attack (TIA), and cerebral infarction without residual deficits: Secondary | ICD-10-CM | POA: Insufficient documentation

## 2019-06-20 DIAGNOSIS — I252 Old myocardial infarction: Secondary | ICD-10-CM | POA: Diagnosis not present

## 2019-06-20 DIAGNOSIS — E119 Type 2 diabetes mellitus without complications: Secondary | ICD-10-CM | POA: Insufficient documentation

## 2019-06-20 DIAGNOSIS — Z7984 Long term (current) use of oral hypoglycemic drugs: Secondary | ICD-10-CM | POA: Diagnosis not present

## 2019-06-20 DIAGNOSIS — I2510 Atherosclerotic heart disease of native coronary artery without angina pectoris: Secondary | ICD-10-CM | POA: Insufficient documentation

## 2019-06-20 DIAGNOSIS — I10 Essential (primary) hypertension: Secondary | ICD-10-CM | POA: Diagnosis not present

## 2019-06-20 DIAGNOSIS — Z87891 Personal history of nicotine dependence: Secondary | ICD-10-CM | POA: Diagnosis not present

## 2019-06-20 DIAGNOSIS — Z79899 Other long term (current) drug therapy: Secondary | ICD-10-CM | POA: Diagnosis not present

## 2019-06-20 LAB — CBC WITH DIFFERENTIAL/PLATELET
Abs Immature Granulocytes: 0.02 10*3/uL (ref 0.00–0.07)
Basophils Absolute: 0.1 10*3/uL (ref 0.0–0.1)
Basophils Relative: 1 %
Eosinophils Absolute: 0.4 10*3/uL (ref 0.0–0.5)
Eosinophils Relative: 6 %
HCT: 35.4 % — ABNORMAL LOW (ref 39.0–52.0)
Hemoglobin: 10.7 g/dL — ABNORMAL LOW (ref 13.0–17.0)
Immature Granulocytes: 0 %
Lymphocytes Relative: 43 %
Lymphs Abs: 3 10*3/uL (ref 0.7–4.0)
MCH: 23.4 pg — ABNORMAL LOW (ref 26.0–34.0)
MCHC: 30.2 g/dL (ref 30.0–36.0)
MCV: 77.5 fL — ABNORMAL LOW (ref 80.0–100.0)
Monocytes Absolute: 0.5 10*3/uL (ref 0.1–1.0)
Monocytes Relative: 7 %
Neutro Abs: 3 10*3/uL (ref 1.7–7.7)
Neutrophils Relative %: 43 %
Platelets: 297 10*3/uL (ref 150–400)
RBC: 4.57 MIL/uL (ref 4.22–5.81)
RDW: 22.7 % — ABNORMAL HIGH (ref 11.5–15.5)
WBC: 6.9 10*3/uL (ref 4.0–10.5)
nRBC: 0 % (ref 0.0–0.2)

## 2019-06-20 LAB — COMPREHENSIVE METABOLIC PANEL
ALT: 30 U/L (ref 0–44)
AST: 34 U/L (ref 15–41)
Albumin: 3.6 g/dL (ref 3.5–5.0)
Alkaline Phosphatase: 75 U/L (ref 38–126)
Anion gap: 11 (ref 5–15)
BUN: 16 mg/dL (ref 8–23)
CO2: 21 mmol/L — ABNORMAL LOW (ref 22–32)
Calcium: 9.2 mg/dL (ref 8.9–10.3)
Chloride: 106 mmol/L (ref 98–111)
Creatinine, Ser: 1.07 mg/dL (ref 0.61–1.24)
GFR calc Af Amer: >60 mL/min (ref >60)
GFR calc non Af Amer: >60 mL/min (ref >60)
Glucose, Bld: 122 mg/dL — ABNORMAL HIGH (ref 70–99)
Potassium: 3.8 mmol/L (ref 3.5–5.1)
Sodium: 138 mmol/L (ref 135–145)
Total Bilirubin: 0.5 mg/dL (ref 0.3–1.2)
Total Protein: 7.6 g/dL (ref 6.5–8.1)

## 2019-06-20 NOTE — ED Triage Notes (Signed)
Patient reports elevated blood pressure at home this morning 175/111 , denies pain , respirations unlabored , no nausea or headache .

## 2019-06-20 NOTE — ED Provider Notes (Signed)
[facility] EMERGENCY DEPARTMENT Provider Note   CSN: [id number] Arrival date & time: 06/20/19  4742     History Chief Complaint  Patient presents with  . Hypertension    Alex [name] is a 64 y.o. male.  The history is provided by the patient. No language interpreter was used.  Hypertension This is a new problem. The current episode started 3 to 5 hours ago. The problem occurs constantly. The problem has been gradually worsening. Pertinent negatives include no chest pain and no headaches. Nothing aggravates the symptoms. Nothing relieves the symptoms. He has tried nothing for the symptoms. The treatment provided no relief.  Pt reports he took his blood pressure this am and it was elevated. Pt took his medication last pm.  Pt reports he did not sleep well and when he checked his blood pressure it was up.  Pt reports he got hot and sweated a lot yesterday      Past Medical History:  Diagnosis Date  . Asthma   . CAD (coronary artery disease)    Cardiac cath 2002 with 25% distal RCA stenosis.   . DDD (degenerative disc disease), lumbar   . Diabetes mellitus ([name])   . Esophageal stricture   . Gastritis 2010  . GERD (gastroesophageal reflux disease)   . Gout   . HTN (hypertension)   . Hyperlipidemia   . Insomnia   . MI (myocardial infarction) (Alex [name]) 2009   no stent placement  . OA (osteoarthritis)   . Obesity   . OSA on CPAP   . Seasonal allergies   . TIA (transient ischemic attack) 2013    Patient Active Problem List   Diagnosis Date Noted  . Diverticulitis 06/11/2019  . Pain of right inguinal ring 05/18/2019  . Pain of left inguinal ring 05/18/2019  . History of GI diverticular bleed 11/14/2018  . Anemia   . Diabetes mellitus without complication (Alex [name])   . Rectal bleeding 11/08/2018  . Dehydration   . Postural dizziness with presyncope 09/05/2017  . Pre-syncope   . Ingrown toenail of left foot with infection 11/02/2016  . Screen for STD  (sexually transmitted disease) 08/14/2016  . Fatigue 11/12/2014  . Vision changes 07/23/2014  . Right foot strain 05/14/2014  . GERD (gastroesophageal reflux disease) 12/17/2013  . Seasonal allergies 06/15/2013  . Dyspnea 05/16/2013  . Headache 12/03/2012  . Rash and nonspecific skin eruption 10/07/2012  . Internal hemorrhoids with other complication 59/56/3875  . Coronary artery disease 07/14/2012  . OSA (obstructive sleep apnea) 06/02/2012  . Chest pain 05/06/2012  . Diastolic heart failure ([place]) 04/02/2012  . History of tobacco use 02/22/2012  . Osteoarthritis 02/22/2012  . History of TIA (transient ischemic attack) 02/22/2012  . Obesity 07/27/2009  . INTERMITTENT CLAUDICATION 02/09/2009  . [place] DISEASE, LUMBAR SPINE 08/04/2008  . ESOPHAGEAL STRICTURE 05/20/2008  . Diabetes type 2, controlled (Alex [name]) 02/23/2008  . HLD (hyperlipidemia) 02/23/2008  . Gout 02/23/2008  . Hypertension associated with diabetes (Alex [name]) 02/23/2008    Past Surgical History:  Procedure Laterality Date  . COLONOSCOPY WITH PROPOFOL N/A 11/11/2018   Procedure: COLONOSCOPY WITH PROPOFOL;  Surgeon: Thornton Park, MD;  Location: [place];  Service: Gastroenterology;  Laterality: N/A;  . ESOPHAGOGASTRODUODENOSCOPY     multiple  . NASAL SINUS SURGERY    . POLYPECTOMY  11/11/2018   Procedure: POLYPECTOMY;  Surgeon: Thornton Park, MD;  Location: [name];  Service: Gastroenterology;;  . [name]  Family History  Problem Relation Age of Onset  . Heart attack Mother   . Heart failure Mother   . Colon cancer Neg Hx   . Esophageal cancer Neg Hx   . Rectal cancer Neg Hx   . Stomach cancer Neg Hx     Social History   Tobacco Use  . Smoking status: Former Smoker    Packs/day: 1.50    Years: 10.00    Pack years: 15.00    Types: Cigarettes    Quit date: 11/06/1991    Years since quitting: 27.6  . Smokeless tobacco: Never Used  . Tobacco comment: quit 1993  Substance  Use Topics  . Alcohol use: No  . Drug use: No    Home Medications Prior to Admission medications   Medication Sig Start Date End Date Taking? Authorizing Provider  ACCU-CHEK FASTCLIX LANCETS MISC 1 Device by Does not apply route daily. 01/26/14   Alex Haven, MD  acetaminophen (TYLENOL) 500 MG tablet Take 1 tablet (500 mg total) by mouth every 6 (six) hours as needed for headache (pain). 12/22/18   Matilde Haymaker, MD  allopurinol (ZYLOPRIM) 100 MG tablet Take 1 tablet by mouth once daily 06/16/19   Matilde Haymaker, MD  amLODipine (NORVASC) 10 MG tablet Take 1 tablet (10 mg total) by mouth daily with supper. 03/03/19   Matilde Haymaker, MD  aspirin EC 81 MG tablet Take 81 mg by mouth daily with supper.    [provider]  atorvastatin (LIPITOR) 20 MG tablet Take 1 tablet (20 mg total) by mouth daily. 05/26/19 08/24/19  Matilde Haymaker, MD  Blood Glucose Monitoring Suppl (ACCU-CHEK AVIVA CONNECT) W/DEVICE KIT 1 Device by Does not apply route daily. 12/01/13   Alex Haven, MD  carvedilol (COREG) 25 MG tablet Take 1 tablet (25 mg total) by mouth daily with supper. 04/03/19   Matilde Haymaker, MD  colchicine 0.6 MG tablet Take 1 tablet (0.6 mg total) by mouth daily. Patient taking differently: Take 0.6 mg by mouth daily as needed (gout flares).  10/29/18   Matilde Haymaker, MD  Ferrous Sulfate (IRON) 325 (65 Fe) MG TABS Take 1 tablet (325 mg total) by mouth every morning. 05/26/19   Matilde Haymaker, MD  glucose blood test strip 1 each by Other route daily. Use as instructed 01/26/14   Alex Haven, MD  irbesartan (AVAPRO) 150 MG tablet Take 1 tablet (150 mg total) by mouth daily with supper. 02/02/19   Matilde Haymaker, MD  Lancets (FREESTYLE) lancets Use as instructed 11/27/13   Alex Dawn, MD  metFORMIN (GLUCOPHAGE-XR) 500 MG 24 hr tablet Take 1 tablet (500 mg total) by mouth daily with breakfast. 05/26/19   Matilde Haymaker, MD  naproxen (NAPROSYN) 500 MG tablet TAKE 1 TABLET BY MOUTH ONCE DAILY AS  NEEDED FOR MODERATE PAIN 04/27/19   Matilde Haymaker, MD  omeprazole (PRILOSEC) 40 MG capsule Take 1 capsule (40 mg total) by mouth daily with supper. 12/02/18   Matilde Haymaker, MD  triamcinolone ointment (KENALOG) 0.5 % APPLY OINTMENT TOPICALLY TO AFFECTED AREA TWICE DAILY FOR THE NEXT WEEK Patient taking differently: Apply 1 application topically 2 (two) times daily as needed (eczema flares).  08/29/18   Diallo, Earna Coder, MD    Allergies    Atorvastatin, Codeine, and Shellfish allergy  Review of Systems   Review of Systems  Cardiovascular: Negative for chest pain.  Neurological: Negative for headaches.  All other systems reviewed and are negative.   Physical Exam Updated Vital  Signs BP (!) 148/97   Pulse 63   Temp 98.1 F (36.7 C)   Resp 15   Ht _0  (1.702 m)   Wt (!) 155 kg   SpO2 99%   BMI 53.52 kg/m   Physical Exam Vitals and nursing note reviewed.  Constitutional:      Appearance: He is well-developed.  HENT:     Head: Normocephalic and atraumatic.     Nose: Nose normal.  Eyes:     Conjunctiva/sclera: Conjunctivae normal.  Cardiovascular:     Rate and Rhythm: Normal rate and regular rhythm.     Heart sounds: No murmur.  Pulmonary:     Effort: Pulmonary effort is normal. No respiratory distress.     Breath sounds: Normal breath sounds.  Abdominal:     Palpations: Abdomen is soft.     Tenderness: There is no abdominal tenderness.  Musculoskeletal:     Cervical back: Neck supple.  Skin:    General: Skin is warm and dry.  Neurological:     General: No focal deficit present.     Mental Status: He is alert.  Psychiatric:        Mood and Affect: Mood normal.     ED Results / Procedures / Treatments   Labs (all labs ordered are listed, but only abnormal results are displayed) Labs Reviewed  CBC WITH DIFFERENTIAL/PLATELET - Abnormal; Notable for the following components:      Result Value   Hemoglobin 10.7 (*)    HCT 35.4 (*)    MCV 77.5 (*)    MCH 23.4 (*)     RDW 22.7 (*)    All other components within normal limits  COMPREHENSIVE METABOLIC PANEL - Abnormal; Notable for the following components:   CO2 21 (*)    Glucose, Bld 122 (*)    All other components within normal limits    EKG None  Radiology No results found.  Procedures Procedures (including critical care time)  Medications Ordered in ED Medications - No data to display  ED Course  I have reviewed the triage vital signs and the nursing notes.  Pertinent labs & imaging results that were available during my care of the patient were reviewed by me and considered in my medical decision making (see chart for details).    MDM Rules/Calculators/A&P                      MDM:  Pt looks good, Blood pressure minimally elevated,  Labs reviewed.  Pt advised Dash diet.  Follow up at [facility] practice for recheck  Final Clinical Impression(s) / ED Diagnoses Final diagnoses:  Hypertension, unspecified type    Rx / DC Orders ED Discharge Orders    None    An After Visit Summary was printed and given to the patient.   Fransico Meadow, [place] 06/20/19 3235    Isla Pence, MD 06/20/19 1415

## 2019-06-20 NOTE — Discharge Instructions (Addendum)
Drink plenty of water.  Follow Dash diet plan.  Take your medications as directed

## 2019-06-20 NOTE — ED Notes (Signed)
Pt given extra fluids to hydrate. Pt provided with discharge paperwork including information on hypertension and DASH eating plan. Pt verbalized understanding, given opportunity to ask questions. No further inquiries by pt at this time.

## 2019-07-13 DIAGNOSIS — N5201 Erectile dysfunction due to arterial insufficiency: Secondary | ICD-10-CM | POA: Diagnosis not present

## 2019-07-13 DIAGNOSIS — R3121 Asymptomatic microscopic hematuria: Secondary | ICD-10-CM | POA: Diagnosis not present

## 2019-07-15 DIAGNOSIS — G4733 Obstructive sleep apnea (adult) (pediatric): Secondary | ICD-10-CM | POA: Diagnosis not present

## 2019-07-27 ENCOUNTER — Other Ambulatory Visit: Payer: Self-pay | Admitting: Family Medicine

## 2019-08-09 ENCOUNTER — Other Ambulatory Visit: Payer: Self-pay | Admitting: Family Medicine

## 2019-09-09 ENCOUNTER — Ambulatory Visit (INDEPENDENT_AMBULATORY_CARE_PROVIDER_SITE_OTHER): Payer: PPO | Admitting: Family Medicine

## 2019-09-09 ENCOUNTER — Other Ambulatory Visit: Payer: Self-pay

## 2019-09-09 VITALS — BP 130/100 | Ht 67.0 in | Wt 315.6 lb

## 2019-09-09 DIAGNOSIS — E1159 Type 2 diabetes mellitus with other circulatory complications: Secondary | ICD-10-CM

## 2019-09-09 DIAGNOSIS — E119 Type 2 diabetes mellitus without complications: Secondary | ICD-10-CM

## 2019-09-09 DIAGNOSIS — I152 Hypertension secondary to endocrine disorders: Secondary | ICD-10-CM

## 2019-09-09 DIAGNOSIS — I10 Essential (primary) hypertension: Secondary | ICD-10-CM | POA: Diagnosis not present

## 2019-09-09 DIAGNOSIS — R55 Syncope and collapse: Secondary | ICD-10-CM

## 2019-09-09 LAB — POCT GLYCOSYLATED HEMOGLOBIN (HGB A1C): HbA1c, POC (controlled diabetic range): 5.9 % (ref 0.0–7.0)

## 2019-09-09 NOTE — Progress Notes (Signed)
    SUBJECTIVE:   CHIEF COMPLAINT / HPI:   lighthededness: patient was playing guitar inside his church on Sunday afternoon and started to feel lightheaded and nauseous and developed a mild headache.  Even though he was inside he says it was hot and he was sweating.  He did not faint or vomit.  He estimates he drank about half a 16oz of water while he was at church. Since that day he feels better but still slightly weak and lightheaded.  On a normal day he will drink about 32 oz of water, some juice, and a cup of coffee.  He urinates at least three times a day he guesses.  He denies dyspnea on exertion or SOB   PERTINENT  PMH / PSH: htn, presyncope, DM2  OBJECTIVE:   BP (!) 130/100   Ht 5\' 7"  (1.702 m)   Wt (!) 315 lb 9.6 oz (143.2 kg)   BMI 49.43 kg/m   Gen: alert. No acute distress.  Obese male, appears stated age.  Appears euvolemic on exam.  Cv: rrr. No murmurs. 2+ radial pulse b/l.  Pulm: lctab. No wheezes or crackles.  Ext: no pedal edema.    ASSESSMENT/PLAN:   Pre-syncope Episode occurred 3 days ago.  Some mild residual symptoms. Likely heat/hypovolemia as a cause given the environment and history of poor rehydration that day. Very susceptible to changes in volume status it appears given his repeated history of near syncopal episodes.  Advised pt to double water intake to at least 64 oz a day and to avoid heat/strenuous exercise.  Will get bmp to check for aki.      Benay Pike, MD [place]

## 2019-09-09 NOTE — Patient Instructions (Addendum)
It was nice to meet you today,  I would like you to schedule AN appointment with our pharmacist Dr. Valentina Lucks to get 24-hour blood pressure monitoring.  This is a 2-day visit.  I would like you to do this before you have your appointment with Dr. Pilar Plate.  You can schedule both if you would like at the front desk.  Please drink plenty of water to stay hydrated especially when you are in situations in which you are sweating or outside.  Try to always keep some water by her side.  Aim for at least 64 ounces of water a day.  I am going to get a blood test to while you are here.  You can discuss the results with Dr. Pilar Plate at your next visit.  Have a great day,  Clemetine Marker, MD

## 2019-09-10 LAB — BASIC METABOLIC PANEL
BUN/Creatinine Ratio: 11 (ref 10–24)
BUN: 13 mg/dL (ref 8–27)
CO2: 23 mmol/L (ref 20–29)
Calcium: 9.7 mg/dL (ref 8.6–10.2)
Chloride: 103 mmol/L (ref 96–106)
Creatinine, Ser: 1.14 mg/dL (ref 0.76–1.27)
GFR calc Af Amer: 78 mL/min/{1.73_m2} (ref >59)
GFR calc non Af Amer: 68 mL/min/{1.73_m2} (ref >59)
Glucose: 107 mg/dL — ABNORMAL HIGH (ref 65–99)
Potassium: 4.6 mmol/L (ref 3.5–5.2)
Sodium: 141 mmol/L (ref 134–144)

## 2019-09-11 NOTE — Assessment & Plan Note (Addendum)
Episode occurred 3 days ago.  Some mild residual symptoms. Likely heat/hypovolemia as a cause given the environment and history of poor rehydration that day. Very susceptible to changes in volume status it appears given his repeated history of near syncopal episodes.  Advised pt to double water intake to at least 64 oz a day and to avoid heat/strenuous exercise.  Will get bmp to check for aki.

## 2019-09-17 ENCOUNTER — Ambulatory Visit: Payer: PPO | Admitting: Pharmacist

## 2019-09-21 ENCOUNTER — Ambulatory Visit (HOSPITAL_COMMUNITY)
Admission: RE | Admit: 2019-09-21 | Discharge: 2019-09-21 | Disposition: A | Discharge status: Home / Self Care | Payer: PPO | Source: Ambulatory Visit | Attending: Family Medicine | Admitting: Family Medicine

## 2019-09-21 ENCOUNTER — Other Ambulatory Visit: Payer: Self-pay

## 2019-09-21 ENCOUNTER — Encounter: Payer: Self-pay | Admitting: Family Medicine

## 2019-09-21 ENCOUNTER — Ambulatory Visit (INDEPENDENT_AMBULATORY_CARE_PROVIDER_SITE_OTHER): Payer: PPO | Admitting: Family Medicine

## 2019-09-21 VITALS — BP 134/80 | HR 74 | Ht 67.0 in | Wt 313.2 lb

## 2019-09-21 DIAGNOSIS — R079 Chest pain, unspecified: Secondary | ICD-10-CM

## 2019-09-21 DIAGNOSIS — R55 Syncope and collapse: Secondary | ICD-10-CM

## 2019-09-21 DIAGNOSIS — R42 Dizziness and giddiness: Secondary | ICD-10-CM | POA: Diagnosis not present

## 2019-09-21 DIAGNOSIS — Z01810 Encounter for preprocedural cardiovascular examination: Secondary | ICD-10-CM | POA: Insufficient documentation

## 2019-09-21 NOTE — Progress Notes (Signed)
    SUBJECTIVE:   CHIEF COMPLAINT / HPI:   Alex [name] is a 64 y.o. male with a history of HTN, HFpEF, and T2DM who presents for a follow up of pre-syncope.   Pre-syncope He has has 2 episodes since his last visit. He states that he felt lightheaded during these episodes. They occurred on exertion and were relieved when he sat down to rest. He has had some "crampy" chest pains, but these were not in the setting of the lightheadedness. He has reported drinking more water since his last visit to remain hydrated. He denies orthostatic hypotension, syncope, seizures, and palpitations.  T2DM Patient reports not checking his blood sugars at home. He states they have only been checked when he comes to the doctor. He also denies taking his metformin because it makes him feel "odd and yucky" and he feels much better off of the medication.  PERTINENT  PMH / PSH: HTN, HFpEF, T2DM  OBJECTIVE:   BP 134/80   Pulse 74   Ht 5\' 7"  (1.702 m)   Wt (!) 313 lb 4 oz (142.1 kg)   SpO2 98%   BMI 49.06 kg/m    PHYSICAL EXAM  GEN: well developed, well-nourished, in NAD HEAD: NCAT, neck supple  CVS: RRR, normal S1/S2, no murmurs, rubs, gallops, 2+ radial pulse RESP: Breathing comfortably on RA, no retractions, wheezes, rhonchi, or crackles SKIN: No lesions or rashes  EXT: Moves all extremities equally, no edema  ASSESSMENT/PLAN:   No problem-specific Assessment & Plan notes found for this encounter.   Pre-syncope Patient has had 2 episodes of pre-syncope since his last visit. He has not experienced syncope, orthostatic hypotension, seizures, or palpitations, and his symptoms improved upon sitting down. Ordered a CBC and ferritin to assess for anemia possibly from GI bleeds. Refer to cardiology for further evaluation of CAD if CBC and ferritin are unremarkable. EKG 12-lead was also obtained to evaluate for any arrhythmias or blocks.   T2DM Patient has not been taking metformin as prescribed and has  not been checking blood sugars at home. Discussed taking half of the metformin (250 mg) instead to further assess for side effects and increase adherence.  [name]

## 2019-09-21 NOTE — Patient Instructions (Signed)
Thanks for coming back in today.  It sounds like you are having very mild episodes of dizziness.  Today in clinic, we will get some blood work to make sure that your blood counts are not really low.  If your blood counts are low, and I would like you to see you GI again because of your history of GI bleeds.  If your blood counts are normal, then we will send you to cardiology just to make sure were not missing anything with regard to your heart health.  I will let you know about the results of your blood work.

## 2019-09-22 LAB — CBC
Hematocrit: 41.1 % (ref 37.5–51.0)
Hemoglobin: 13.6 g/dL (ref 13.0–17.7)
MCH: 26.3 pg — ABNORMAL LOW (ref 26.6–33.0)
MCHC: 33.1 g/dL (ref 31.5–35.7)
MCV: 80 fL (ref 79–97)
Platelets: 313 10*3/uL (ref 150–450)
RBC: 5.17 x10E6/uL (ref 4.14–5.80)
RDW: 15.4 % (ref 11.6–15.4)
WBC: 7.4 10*3/uL (ref 3.4–10.8)

## 2019-09-22 LAB — FERRITIN: Ferritin: 170 ng/mL (ref 30–400)

## 2019-09-23 ENCOUNTER — Other Ambulatory Visit: Payer: Self-pay | Admitting: Family Medicine

## 2019-09-23 DIAGNOSIS — R55 Syncope and collapse: Secondary | ICD-10-CM

## 2019-09-29 ENCOUNTER — Other Ambulatory Visit: Payer: Self-pay

## 2019-09-29 ENCOUNTER — Ambulatory Visit (INDEPENDENT_AMBULATORY_CARE_PROVIDER_SITE_OTHER): Payer: PPO | Admitting: Pharmacist

## 2019-09-29 DIAGNOSIS — E1159 Type 2 diabetes mellitus with other circulatory complications: Secondary | ICD-10-CM | POA: Diagnosis not present

## 2019-09-29 DIAGNOSIS — E785 Hyperlipidemia, unspecified: Secondary | ICD-10-CM | POA: Diagnosis not present

## 2019-09-29 DIAGNOSIS — M10029 Idiopathic gout, unspecified elbow: Secondary | ICD-10-CM

## 2019-09-29 DIAGNOSIS — I152 Hypertension secondary to endocrine disorders: Secondary | ICD-10-CM

## 2019-09-29 DIAGNOSIS — I10 Essential (primary) hypertension: Secondary | ICD-10-CM | POA: Diagnosis not present

## 2019-09-29 NOTE — Patient Instructions (Addendum)
It was great seeing you today!    Take one cholesterol pill (rosuvastatin) 10mg  once daily.    Next visit with Dr. Pilar Plate 1-2 months.

## 2019-09-29 NOTE — Progress Notes (Signed)
S:    Patient presents to the clinic in good spirits for ambulatory blood pressure evaluation.   Patient was last seen by Primary Care Provider on 09/21/2019.   Diagnosed with Hypertension in the year of 2009.    Medication compliance is reported to be fair - patient has not been taking his atorvastatin and metformin.  Discussed procedure for wearing the monitor and gave patient written instructions. Monitor was placed on non-dominant arm with instructions to return in the morning.   Current BP Medications include:  Amlodipine 10 mg qHS, irbesartan 150 mg qHS, carvedilol 25 mg daily  Antihypertensives tried in the past include: losartan100 mg daily, lisinopril 40 mg  Briefly reinforced importance of diet and exercise.   O:  PHQ-9: 0  Physical Exam Constitutional:      Appearance: Normal appearance. He is obese.  Neurological:     Mental Status: He is alert.  Psychiatric:        Mood and Affect: Mood normal.        Behavior: Behavior normal.        Thought Content: Thought content normal.        Judgment: Judgment normal.    Review of Systems  All other systems reviewed and are negative.   Last 3 Office BP readings: BP Readings from Last 3 Encounters:  09/29/19 (!) 144/104  09/21/19 134/80  09/09/19 (!) 130/100     Clinical Atherosclerotic Cardiovascular Disease (ASCVD): Yes  The 10-year ASCVD risk score Mikey Bussing DC Jr., et al., 2013) is: 36.8%   Values used to calculate the score:     Age: 64 years     Sex: Male     Is Non-Hispanic African American: Yes     Diabetic: Yes     Tobacco smoker: No     Systolic Blood Pressure: 371 mmHg     Is BP treated: Yes     HDL Cholesterol: 32 mg/dL     Total Cholesterol: 194 mg/dL  Basic Metabolic Panel    Component Value Date/Time   NA 141 09/09/2019 1027   K 4.6 09/09/2019 1027   CL 103 09/09/2019 1027   CO2 23 09/09/2019 1027   GLUCOSE 107 (H) 09/09/2019 1027   GLUCOSE 122 (H) 06/20/2019 0402   BUN 13 09/09/2019  1027   CREATININE 1.14 09/09/2019 1027   CREATININE 1.11 09/29/2015 1603   CALCIUM 9.7 09/09/2019 1027   GFRNONAA 68 09/09/2019 1027   GFRAA 78 09/09/2019 1027    Today's Office Blood Pressure (BP) reading: 144/108 mmHg (manual reading)  ABPM Study Data: Arm Placement left arm   Overall Mean 24hr BP:   124/79 mmHg HR: 73   Daytime Mean BP:  130/84 mmHg HR: 78   Nighttime Mean BP:  108/66 mmHg HR: 61   Dipping Pattern: Yes.    Sys:   16.8%   Dia: 21.2%   [normal dipping ~10-20%]  Non-hypertensive ABPM thresholds: daytime BP <125/75 mmHg, sleeptime BP <120/70 mmHg   A/P: HTN: History of hypertension since 2009 found to have white coat hypertension (blood presssure at goal). Given 24-hour ambulatory blood pressure demonstrates an element of white-coat hypertension evidenced by elevated readings in clinic and ~2 hours after clinic visit. Readings indicate adequate blood pressure control, with an average blood pressure of 130/84 mmHg and a nocturnal dipping pattern that is normal.   Medication changes: recommend at next visit to change Irbesartan 150 mg to Losartan 100 mg  Cholesterol: Patient has uncontrolled hyperlipidemia evidenced  by LDL of 139 (05/26/19) and reported non-adherence to atorvastatin.  Medication changes: Initiated rosuvastatin 10 mg  Gout: Patient-reported recent gouty flare in elbow and suspected elevated uric acid level (last uric acid was 8.6 on 08/06/2014) on allopurinol 100 mg daily and colchicine 0.6 mg daily Recommendations/medication changes: At next visit, increase to allopurinol 200 mg daily. Change irbesartan to losartan for additional uric acid lowering. Obtain uric acid level at next visit.   Results reviewed and written information provided.  Total time in face-to-face counseling 45 minutes.   F/U Clinic Visit with Dr. Pilar Plate in 1-2 months.  Patient seen with Laurey Arrow, PharmD Candidate.

## 2019-09-30 MED ORDER — ROSUVASTATIN CALCIUM 10 MG PO TABS: 10.0000 mg | ORAL_TABLET | Freq: Every day | ORAL | 5 refills | Status: DC

## 2019-09-30 NOTE — Assessment & Plan Note (Signed)
Gout: Patient-reported recent gouty flare in elbow and suspected elevated uric acid level (last uric acid was 8.6 on 08/06/2014) on allopurinol 100 mg daily and colchicine 0.6 mg daily Recommendations/medication changes: At next visit, increase to allopurinol 200 mg daily. Change irbesartan to losartan for additional uric acid lowering. Obtain uric acid level at next visit.

## 2019-09-30 NOTE — Assessment & Plan Note (Signed)
HTN: History of hypertension since 2009 found to have white coat hypertension (blood presssure at goal). Given 24-hour ambulatory blood pressure demonstrates an element of white-coat hypertension evidenced by elevated readings in clinic and ~2 hours after clinic visit. Readings indicate adequate blood pressure control, with an average blood pressure of 130/84 mmHg and a nocturnal dipping pattern that is normal.   Medication changes: recommend at next visit to change Irbesartan 150 mg to Losartan 100 mg

## 2019-09-30 NOTE — Assessment & Plan Note (Signed)
Cholesterol: Patient has uncontrolled hyperlipidemia evidenced by LDL of 139 (05/26/19) and reported non-adherence to atorvastatin.  Medication changes: Initiated rosuvastatin 10 mg

## 2019-11-07 ENCOUNTER — Other Ambulatory Visit: Payer: Self-pay | Admitting: Family Medicine

## 2019-11-09 ENCOUNTER — Other Ambulatory Visit: Payer: Self-pay

## 2019-11-09 NOTE — Telephone Encounter (Signed)
error 

## 2019-11-10 ENCOUNTER — Other Ambulatory Visit: Payer: Self-pay

## 2019-11-11 NOTE — Telephone Encounter (Signed)
Spoke with pt. Informed him of the note below. Pt understood and didn't say if he would do what was suggested in the note. I did advise him to make an appt if he feels the need or it doesn't symptoms don't get any better. Salvatore Marvel, CMA

## 2019-11-11 NOTE — Telephone Encounter (Signed)
If Alex [name] is concerned that he has a flair of diverticulitis he should reduce his diet to clear liquids (broths and drinks) and schedule an appointment to be seen.  I would prefer not to prescribe antibiotic unless he is seen first.  Antibiotics are also not first line therapy for uncomplicated diverticulitis.  Matilde Haymaker, MD

## 2019-11-28 ENCOUNTER — Other Ambulatory Visit: Payer: Self-pay | Admitting: Family Medicine

## 2019-12-06 ENCOUNTER — Other Ambulatory Visit: Payer: Self-pay | Admitting: Family Medicine

## 2019-12-07 ENCOUNTER — Encounter: Payer: Self-pay | Admitting: Cardiovascular Disease

## 2019-12-07 ENCOUNTER — Other Ambulatory Visit: Payer: Self-pay

## 2019-12-07 ENCOUNTER — Ambulatory Visit: Payer: PPO | Admitting: Cardiovascular Disease

## 2019-12-07 VITALS — BP 134/86 | HR 61 | Ht 67.0 in | Wt 322.4 lb

## 2019-12-07 DIAGNOSIS — E7800 Pure hypercholesterolemia, unspecified: Secondary | ICD-10-CM

## 2019-12-07 DIAGNOSIS — I10 Essential (primary) hypertension: Secondary | ICD-10-CM | POA: Diagnosis not present

## 2019-12-07 DIAGNOSIS — I2510 Atherosclerotic heart disease of native coronary artery without angina pectoris: Secondary | ICD-10-CM

## 2019-12-07 LAB — LIPID PANEL
Chol/HDL Ratio: 7.1 ratio — ABNORMAL HIGH (ref 0.0–5.0)
Cholesterol, Total: 227 mg/dL — ABNORMAL HIGH (ref 100–199)
HDL: 32 mg/dL — ABNORMAL LOW (ref >39)
LDL Chol Calc (NIH): 165 mg/dL — ABNORMAL HIGH (ref 0–99)
Triglycerides: 163 mg/dL — ABNORMAL HIGH (ref 0–149)
VLDL Cholesterol Cal: 30 mg/dL (ref 5–40)

## 2019-12-07 NOTE — Progress Notes (Signed)
Chief Complaint  Patient presents with  . New Patient (Initial Visit)    Syncope    History of Present Illness: 64 yo male with history of CAD, chronic diastolic CHF, DM, GERD, gout, HTN, Hyperlipidemia, obesity, sleep apnea, prior TIA who is here today as a patient to re-establish cardiac care.  He was seen as a new patient in our office in 2013 but has not been seen here since 2016. Cardiac cath in 2002 showed 25% stenosis in the distal RCA. He was admitted to [facility] December 2009 with chest pain and ruled out for an MI with serial cardiac enzymes. Stress myoview December 2009 with ejection fraction of 48%. There was mild apical thinning, but no ischemia or scar. There was left ventricular enlargement. Echo in 2013 showed normal LV size and function with LVEF of 55-65% and moderate LVH. There were no significant valvular issues. Lexiscan myoview on 11/15/11 with no inducible ischemia. Admitted to [facility] 12/24/11 with TIA type symptoms. His last echo in June 2019 showed normal LV size and function with LVEF=50-55%. No valve issues. He had dizziness in July 2021 and was seen in primary care. He was not orthostatic and no anemic. EKG with sinus there.   He is here today for follow up. The patient denies any chest pain, dyspnea, palpitations, lower extremity edema, orthopnea, PND. He has had no dizziness over the past two months. He feels great.   Primary Care Physician: Matilde Haymaker, MD   Past Medical History:  Diagnosis Date  . Asthma   . CAD (coronary artery disease)    Cardiac cath 2002 with 25% distal RCA stenosis.   . DDD (degenerative disc disease), lumbar   . Diabetes mellitus ([place])   . Esophageal stricture   . Gastritis 2010  . GERD (gastroesophageal reflux disease)   . Gout   . HTN (hypertension)   . Hyperlipidemia   . Insomnia   . MI (myocardial infarction) ([place]) 2009   no stent placement  . OA (osteoarthritis)   . Obesity   . OSA on CPAP   . Seasonal allergies   . TIA  (transient ischemic attack) 2013    Past Surgical History:  Procedure Laterality Date  . COLONOSCOPY WITH PROPOFOL N/A 11/11/2018   Procedure: COLONOSCOPY WITH PROPOFOL;  Surgeon: Thornton Park, MD;  Location: [place] [facility];  Service: Gastroenterology;  Laterality: N/A;  . ESOPHAGOGASTRODUODENOSCOPY     multiple  . NASAL SINUS SURGERY    . POLYPECTOMY  11/11/2018   Procedure: POLYPECTOMY;  Surgeon: Thornton Park, MD;  Location: [facility];  Service: Gastroenterology;;  . VASECTOMY      Current Outpatient Medications  Medication Sig Dispense Refill  . ACCU-CHEK FASTCLIX LANCETS MISC 1 Device by Does not apply route daily. 102 each 3  . acetaminophen (TYLENOL) 500 MG tablet Take 1 tablet (500 mg total) by mouth every 6 (six) hours as needed for headache (pain). 30 tablet 10  . allopurinol (ZYLOPRIM) 100 MG tablet Take 1 tablet by mouth once daily 60 tablet 3  . amLODipine (NORVASC) 10 MG tablet Take 1 tablet (10 mg total) by mouth daily with supper. 90 tablet 2  . aspirin EC 81 MG tablet Take 81 mg by mouth daily with supper.    . Blood Glucose Monitoring Suppl (ACCU-CHEK AVIVA CONNECT) W/DEVICE KIT 1 Device by Does not apply route daily. 1 kit 0  . carvedilol (COREG) 25 MG tablet Take 1 tablet (25 mg total) by mouth daily with supper. 90 tablet  3  . colchicine 0.6 MG tablet Take 1 tablet (0.6 mg total) by mouth daily. 6 tablet 0  . Ferrous Sulfate (IRON) 325 (65 Fe) MG TABS Take 1 tablet (325 mg total) by mouth every morning. 90 tablet 3  . glucose blood test strip 1 each by Other route daily. Use as instructed 100 each 12  . irbesartan (AVAPRO) 150 MG tablet TAKE 1 TABLET BY MOUTH ONCE DAILY WITH SUPPER. 90 tablet 1  . Lancets (FREESTYLE) lancets Use as instructed 100 each 12  . naproxen (NAPROSYN) 500 MG tablet TAKE 1 TABLET BY MOUTH ONCE DAILY AS NEEDED FOR MODERATE PAIN 30 tablet 0  . omeprazole (PRILOSEC) 40 MG capsule TAKE 1 CAPSULE BY MOUTH ONCE DAILY WITH SUPPER. 90 capsule  3  . triamcinolone ointment (KENALOG) 0.5 % APPLY OINTMENT TOPICALLY TO AFFECTED AREA TWICE DAILY FOR THE NEXT WEEK 15 g 0   No current facility-administered medications for this visit.    Allergies  Allergen Reactions  . Atorvastatin Other (See Comments)    Subjective myalgias. Gave 2 trials, developed myalgias which resolved after cessation of medication.  Muscle cramps were 9/10 discomfort  . Codeine Nausea Only  . Shellfish Allergy Other (See Comments)    Activates asthma    Social History   Socioeconomic History  . Marital status: Widowed    Spouse name: Not on file  . Number of children: 4  . Years of education: 43  . Highest education level: Not on file  Occupational History  . Occupation: Disability-truck driver  Tobacco Use  . Smoking status: Former Smoker    Packs/day: 1.50    Years: 10.00    Pack years: 15.00    Types: Cigarettes    Quit date: 11/06/1991    Years since quitting: 28.1  . Smokeless tobacco: Never Used  . Tobacco comment: quit 1993  Substance and Sexual Activity  . Alcohol use: No  . Drug use: No  . Sexual activity: Not on file  Other Topics Concern  . Not on file  Social History Narrative   On disability   [employer]   Quit smoking 20 years ago (as of 01/2012)      Health Care POA:    Emergency Contact: brother, Mitzi Hansen [phone]   End of Life Plan: gave Ad pamphlet 5/15   Who lives with you: self   Any pets: none   Diet: pt has a varied diet of protein, starch and vegetables.   Exercise: pt does not have regular exercise routine.   Seatbelts: Pt reports wearing seatbelt when in vehicles.    Hobbies: plays bass and keyboard in church music group         Social Determinants of Health   Financial Resource Strain:   . Difficulty of Paying Living Expenses: Not on file  Food Insecurity:   . Worried About Charity fundraiser in the Last Year: Not on file  . Ran Out of Food in the Last Year: Not on file  Transportation Needs:   . Lack of  Transportation (Medical): Not on file  . Lack of Transportation (Non-Medical): Not on file  Physical Activity:   . Days of Exercise per Week: Not on file  . Minutes of Exercise per Session: Not on file  Stress:   . Feeling of Stress : Not on file  Social Connections:   . Frequency of Communication with Friends and Family: Not on file  . Frequency of Social Gatherings with Friends and Family:  Not on file  . Attends Religious Services: Not on file  . Active Member of Clubs or Organizations: Not on file  . Attends Archivist Meetings: Not on file  . Marital Status: Not on file  Intimate Partner Violence:   . Fear of Current or Ex-Partner: Not on file  . Emotionally Abused: Not on file  . Physically Abused: Not on file  . Sexually Abused: Not on file    Family History  Problem Relation Age of Onset  . Heart attack Mother   . Heart failure Mother   . Colon cancer Neg Hx   . Esophageal cancer Neg Hx   . Rectal cancer Neg Hx   . Stomach cancer Neg Hx     Review of Systems:  As stated in the HPI and otherwise negative.   BP 134/86   Pulse 61   Ht _0  (1.702 m)   Wt (!) 322 lb 6.4 oz (146.2 kg)   SpO2 96%   BMI 50.49 kg/m   Physical Examination: General: Well developed, well nourished, NAD  HEENT: OP clear, mucus membranes moist  SKIN: warm, dry. No rashes. Neuro: No focal deficits  Musculoskeletal: Muscle strength 5/5 all ext  Psychiatric: Mood and affect normal  Neck: No JVD, no carotid bruits, no thyromegaly, no lymphadenopathy.  Lungs:Clear bilaterally, no wheezes, rhonci, crackles Cardiovascular: Regular rate and rhythm. No murmurs, gallops or rubs. Abdomen:Soft. Bowel sounds present. Non-tender.  Extremities: No lower extremity edema. Pulses are 2 + in the bilateral DP/PT.  EKG:  EKG is ordered today. The ekg ordered today demonstrates Sinus  Echo June 2019:  - Left ventricle: The cavity size was normal. Systolic function was  at the lower limits  of normal. The estimated ejection fraction  was in the range of 50% to 55%. Wall motion was normal; there  were no regional wall motion abnormalities. Doppler parameters  are consistent with abnormal left ventricular relaxation (grade 1  diastolic dysfunction).  - Left atrium: The atrium was mildly dilated.   Recent Labs: 05/18/2019: BNP 15.6 06/20/2019: ALT 30 09/09/2019: BUN 13; Creatinine, Ser 1.14; Potassium 4.6; Sodium 141 09/21/2019: Hemoglobin 13.6; Platelets 313   Lipid Panel    Component Value Date/Time   CHOL 194 05/26/2019 1001   TRIG 127 05/26/2019 1001   HDL 32 (L) 05/26/2019 1001   CHOLHDL 6.1 (H) 05/26/2019 1001   CHOLHDL 8.4 09/06/2017 0512   VLDL 39 09/06/2017 0512   LDLCALC 139 (H) 05/26/2019 1001   LDLDIRECT 130 (H) 02/22/2012 0910     Wt Readings from Last 3 Encounters:  12/07/19 (!) 322 lb 6.4 oz (146.2 kg)  09/29/19 (!) 316 lb 12.8 oz (143.7 kg)  09/21/19 (!) 313 lb 4 oz (142.1 kg)       Assessment and Plan:   1.CAD without angina: He had mild disease by cath 2002. No chest pain. Continue  ASA, beta blocker. He does not tolerate statins. See below.   2.  Hyperlipidemia: He has stopped his statin due to muscle aches. He had trouble with Lipitor and Crestor. He is not willing to consider Repatha. Given known CAD, will repeat lipid profile now and refer to the lipid clinic to review options.  3. HTN: BP is well controlled. No changes  4. OSA: He wears CPAP    Current medicines are reviewed at length with the patient today.  The patient does not have concerns regarding medicines.  The following changes have been made:  no change  Labs/ tests ordered today include:   Orders Placed This Encounter  Procedures  . Lipid panel  . AMB Referral to [facility] Pharm[facility]  . EKG 12-Lead     Disposition:   FU with me in one year.    Signed, Lauree Chandler, MD 12/07/2019 9:20 AM    [facility] [place],  [place], [place]  [address] Phone: [phone]; Fax: [phone]

## 2019-12-07 NOTE — Patient Instructions (Signed)
Medication Instructions:  No changes *If you need a refill on your cardiac medications before your next appointment, please call your pharmacy*   Lab Work: Lipids today If you have labs (blood work) drawn today and your tests are completely normal, you will receive your results only by: Marland Kitchen MyChart Message (if you have MyChart) OR . A paper copy in the mail If you have any lab test that is abnormal or we need to change your treatment, we will call you to review the results.   Testing/Procedures: none   Follow-Up: At [facility], you and your health needs are our priority.  As part of our continuing mission to provide you with exceptional heart care, we have created designated Provider Care Teams.  These Care Teams include your primary Cardiologist (physician) and Advanced Practice Providers (APPs -  Physician Assistants and Nurse Practitioners) who all work together to provide you with the care you need, when you need it.  We recommend signing up for the patient portal called "MyChart".  Sign up information is provided on this After Visit Summary.  MyChart is used to connect with patients for Virtual Visits (Telemedicine).  Patients are able to view lab/test results, encounter notes, upcoming appointments, etc.  Non-urgent messages can be sent to your provider as well.   To learn more about what you can do with MyChart, go to [email or web address].    Your next appointment:   12 month(s)  The format for your next appointment:   In Person  Provider:   You may see Lauree Chandler, MD or one of the following Advanced Practice Providers on your designated Care Team:    Melina Copa, PA-C  Ermalinda Barrios, PA-C    Other Instructions You have been referred to the [facility] for lipid management.

## 2019-12-09 ENCOUNTER — Telehealth: Payer: Self-pay

## 2019-12-09 ENCOUNTER — Telehealth: Payer: Self-pay | Admitting: Cardiology

## 2019-12-09 NOTE — Telephone Encounter (Signed)
The patient has been notified of the result and verbalized understanding.  All questions (if any) were answered. Wilma Flavin, RN 12/09/2019 12:31 PM

## 2019-12-09 NOTE — Telephone Encounter (Signed)
Pt called in returning a call for his lab results from 9/27  Best number [phone]

## 2019-12-09 NOTE — Telephone Encounter (Signed)
-----   Message from Burnell Blanks, MD sent at 12/08/2019  8:13 AM EDT ----- Can we let him know that his LDL is very high?  He is not currently on statins and does not wish to try another statin. Referral to lipid clinic made yesterday.

## 2019-12-09 NOTE — Telephone Encounter (Signed)
LMTCB regarding lab results.  

## 2019-12-22 ENCOUNTER — Encounter: Payer: Self-pay | Admitting: Pharmacist

## 2019-12-22 ENCOUNTER — Ambulatory Visit (INDEPENDENT_AMBULATORY_CARE_PROVIDER_SITE_OTHER): Payer: PPO | Admitting: Pharmacist

## 2019-12-22 ENCOUNTER — Other Ambulatory Visit: Payer: Self-pay

## 2019-12-22 VITALS — Wt 320.0 lb

## 2019-12-22 DIAGNOSIS — E7800 Pure hypercholesterolemia, unspecified: Secondary | ICD-10-CM

## 2019-12-22 DIAGNOSIS — M791 Myalgia, unspecified site: Secondary | ICD-10-CM | POA: Diagnosis not present

## 2019-12-22 DIAGNOSIS — I2510 Atherosclerotic heart disease of native coronary artery without angina pectoris: Secondary | ICD-10-CM

## 2019-12-22 DIAGNOSIS — T466X5A Adverse effect of antihyperlipidemic and antiarteriosclerotic drugs, initial encounter: Secondary | ICD-10-CM | POA: Insufficient documentation

## 2019-12-22 MED ORDER — PRAVASTATIN SODIUM 20 MG PO TABS: 20.0000 mg | ORAL_TABLET | Freq: Every evening | ORAL | 1 refills | Status: DC

## 2019-12-22 MED ORDER — EZETIMIBE 10 MG PO TABS: 10.0000 mg | ORAL_TABLET | Freq: Every day | ORAL | 1 refills | Status: DC

## 2019-12-22 NOTE — Progress Notes (Signed)
Patient ID: Alex [name]                 DOB: 1956-01-20                    MRN: [id number]     HPI: Alex [name] is a 64 y.o. male patient referred to lipid clinic by Dr Angelena Form. PMH is significant for CAD, CHF, HTN, MI, OSA, DM, hx of TIA, obesity, and former tobacco use.    Patient [facility]established with [facility] on 12/07/19.  Lipid levels very elevated.  He has previously taken pravastatin, simvastatin, atorvastatin, and rosuvastatin.  Both rosuvastatin and atorvastatin caused severe muscle pain.  He does not remember why pravastatin or simvastatin were discontinued.  Presents today in good spirits. Would like to lose weight but is not very active.  Does part time janitorial services 3 days a week which helps him move around a little but is very sedentary at home.  Reports he does have exercise equipment but he has not been using.  Diet is heavy on snack foods such as potato chips, fritos, and pretzels.  Does eat out occasionally.  Lives alone so does not do much cooking.  Also has diagnosis of diverticulitis so is very conscientious of what he eats to avoid flare ups.  Denies alcohol consumption and quit smoking ~ 20 years ago.    Is willing to retry pravastatin due to not knowing if he had any adverse reactions which are unlikely since he was on it for from 2013-2015.  Declines PCSK9i.  Current Medications: no lipid lowering therapies Intolerances: Rosuvastatin, Atorvastatin Risk Factors: CHF, CAD, hx of smoking, DM, hx of TIA, hx of MI LDL goal: <55  Diet: Varies, lots of snack foods.  Reports he does overeat.  Exercise:  Very sedentary except when at his part time job  Social History: no alcohol or smoking  Labs: TC 227, Trigs 163, HDL 32, LDL 165 (12/07/19) not on any cholesterol lowering meds  Past Medical History:  Diagnosis Date  . Asthma   . CAD (coronary artery disease)    Cardiac cath 2002 with 25% distal RCA stenosis.   . DDD (degenerative disc disease), lumbar   .  Diabetes mellitus ([place])   . Esophageal stricture   . Gastritis 2010  . GERD (gastroesophageal reflux disease)   . Gout   . HTN (hypertension)   . Hyperlipidemia   . Insomnia   . MI (myocardial infarction) ([place]) 2009   no stent placement  . OA (osteoarthritis)   . Obesity   . OSA on CPAP   . Seasonal allergies   . TIA (transient ischemic attack) 2013    Current Outpatient Medications on File Prior to Visit  Medication Sig Dispense Refill  . ACCU-CHEK FASTCLIX LANCETS MISC 1 Device by Does not apply route daily. 102 each 3  . acetaminophen (TYLENOL) 500 MG tablet Take 1 tablet (500 mg total) by mouth every 6 (six) hours as needed for headache (pain). 30 tablet 10  . allopurinol (ZYLOPRIM) 100 MG tablet Take 1 tablet by mouth once daily 60 tablet 3  . amLODipine (NORVASC) 10 MG tablet TAKE 1 TABLET BY MOUTH ONCE DAILY WITH  SUPPER 90 tablet 3  . aspirin EC 81 MG tablet Take 81 mg by mouth daily with supper.    . Blood Glucose Monitoring Suppl (ACCU-CHEK AVIVA CONNECT) W/DEVICE KIT 1 Device by Does not apply route daily. 1 kit 0  . carvedilol (COREG) 25  MG tablet Take 1 tablet (25 mg total) by mouth daily with supper. 90 tablet 3  . colchicine 0.6 MG tablet Take 1 tablet (0.6 mg total) by mouth daily. 6 tablet 0  . Ferrous Sulfate (IRON) 325 (65 Fe) MG TABS Take 1 tablet (325 mg total) by mouth every morning. 90 tablet 3  . glucose blood test strip 1 each by Other route daily. Use as instructed 100 each 12  . irbesartan (AVAPRO) 150 MG tablet TAKE 1 TABLET BY MOUTH ONCE DAILY WITH SUPPER. 90 tablet 1  . Lancets (FREESTYLE) lancets Use as instructed 100 each 12  . naproxen (NAPROSYN) 500 MG tablet TAKE 1 TABLET BY MOUTH ONCE DAILY AS NEEDED FOR MODERATE PAIN 30 tablet 0  . omeprazole (PRILOSEC) 40 MG capsule TAKE 1 CAPSULE BY MOUTH ONCE DAILY WITH SUPPER. 90 capsule 3  . triamcinolone ointment (KENALOG) 0.5 % APPLY OINTMENT TOPICALLY TO AFFECTED AREA TWICE DAILY FOR THE NEXT WEEK 15 g 0    No current facility-administered medications on file prior to visit.    Allergies  Allergen Reactions  . Atorvastatin Other (See Comments)    Subjective myalgias. Gave 2 trials, developed myalgias which resolved after cessation of medication.  Muscle cramps were 9/10 discomfort  . Codeine Nausea Only  . Shellfish Allergy Other (See Comments)    Activates asthma    Assessment/Plan:  1. Hyperlipidemia - Patient LDL 165 which is above goal of <55.  Patient elevated LDL likely due to poor diet, sedentary lifestyle, no medications, and comorbidities such as DM.    Discussed extensively patient's need to be more physcially active.  Recommended increasing exercise up to 30 minutes a day 5 days a week.  Patient will begin using his home exercise equipment.  Discussed need for diet changes.  Patients diet high in processed snack foods.  Educated on difference between saturated fats, trans fats, and unsaturated fats.  Recommended he decrease portion sizes and quantity of processed foods.  Since patient is willing to retry pravastatin, will start at low dose along with Zetia with plan to recheck lipid panel in 3 months.  ~Begin pravastatin 20 mg in evening ~Begin Zetia 10 mg ~Recheck LDL in 3 month  Karren Cobble, PharmD, BCACP, [facility] [address], [place], [place] [address] Phone: [phone]; Fax: [phone] 12/22/2019 9:52 AM

## 2019-12-22 NOTE — Patient Instructions (Addendum)
It was nice meeting you today!  Your LDL (bad cholesterol) is 165.  We would like it to be less than 55.  Cut down on your snack foods and increase your physical activity  Try to be active at least 30 minutes a day at least 5 days a week  We are going to restart you on pravastatin 20mg  which you will take once nightly and Ezetimibe which you will take once daily  Please call with any questions or side effects!  Karren Cobble, PharmD, BCACP, [facility] [address] [place], [place] [address] Phone: [phone]; Fax: [phone] 12/22/2019 9:20 AM

## 2020-01-28 DIAGNOSIS — G4733 Obstructive sleep apnea (adult) (pediatric): Secondary | ICD-10-CM | POA: Diagnosis not present

## 2020-01-29 ENCOUNTER — Other Ambulatory Visit: Payer: Self-pay

## 2020-01-29 MED ORDER — ALLOPURINOL 100 MG PO TABS: 100.0000 mg | ORAL_TABLET | Freq: Every day | ORAL | 3 refills | Status: DC

## 2020-02-09 ENCOUNTER — Other Ambulatory Visit: Payer: Self-pay | Admitting: Family Medicine

## 2020-02-10 ENCOUNTER — Other Ambulatory Visit: Payer: Self-pay

## 2020-02-10 MED ORDER — TRIAMCINOLONE ACETONIDE 0.5 % EX OINT: TOPICAL_OINTMENT | CUTANEOUS | 0 refills | Status: DC

## 2020-03-22 ENCOUNTER — Telehealth: Payer: Self-pay | Admitting: Pharmacist

## 2020-03-22 NOTE — Telephone Encounter (Signed)
Called patient and lmom reminding patient to have lipid panel drawn

## 2020-03-23 ENCOUNTER — Other Ambulatory Visit: Payer: Self-pay

## 2020-03-23 ENCOUNTER — Ambulatory Visit (INDEPENDENT_AMBULATORY_CARE_PROVIDER_SITE_OTHER): Payer: PPO | Admitting: Family Medicine

## 2020-03-23 VITALS — BP 130/88 | HR 72 | Wt 320.0 lb

## 2020-03-23 DIAGNOSIS — E785 Hyperlipidemia, unspecified: Secondary | ICD-10-CM | POA: Diagnosis not present

## 2020-03-23 DIAGNOSIS — I5032 Chronic diastolic (congestive) heart failure: Secondary | ICD-10-CM

## 2020-03-23 DIAGNOSIS — I152 Hypertension secondary to endocrine disorders: Secondary | ICD-10-CM | POA: Diagnosis not present

## 2020-03-23 DIAGNOSIS — E119 Type 2 diabetes mellitus without complications: Secondary | ICD-10-CM | POA: Diagnosis not present

## 2020-03-23 DIAGNOSIS — E1159 Type 2 diabetes mellitus with other circulatory complications: Secondary | ICD-10-CM

## 2020-03-23 DIAGNOSIS — Z23 Encounter for immunization: Secondary | ICD-10-CM | POA: Diagnosis not present

## 2020-03-23 LAB — POCT GLYCOSYLATED HEMOGLOBIN (HGB A1C): Hemoglobin A1C: 6.2 % — AB (ref 4.0–5.6)

## 2020-03-23 MED ORDER — IRBESARTAN 300 MG PO TABS: 150.0000 mg | ORAL_TABLET | Freq: Every day | ORAL | 2 refills | Status: DC

## 2020-03-23 NOTE — Assessment & Plan Note (Signed)
Not on any meds as of now due to intolerance. Follows with Lipid clinic. - Will recheck lipid panel today - Follow-up with lipid clinic

## 2020-03-23 NOTE — Patient Instructions (Addendum)
Today we talked about the spot on your leg. While we can't be positive what it is, it is nothing dangerous. It looks most similar to a condition called lichen simplex chronicus. Try using your Kenalog on it twice a day for one week and see if it improves at all. If it does, continue with the Kenalog. If it does not improve, let us know and we can try something else.   Your diabetes appears well-controlled today with an A1c of 6.2. We won't add any medications at this time. Continue the good work with your diet and consider adding some exercise, 30 minutes 5 days per week.   Your blood pressure was up just a little bit, so we're going to increase your Irbesartan to 300mg .  Please come back to clinic in the next 1-2 weeks for a lab draw.  You do not have to see a provider.  We just want to make sure that your kidney function and electrolytes are appropriate with this medication change.  Cholesterol: We will check your cholesterol today.  Please use the contact information below to schedule a follow-up appointment at the lipid clinic.  They have additional medication options available there that may be helpful for you. Karren Cobble, PharmD, BCACP, [facility] [address], [place], [place] [address] Phone: [phone]; Fax: [phone]  Remember: As you get older and have more issues with your heart, there is an increased risk of using naproxen frequently.  I recommend that you try to use more Tylenol and less naproxen.  Would be appropriate for you to take 500 mg - 1000 mg of Tylenol for typical joint pain.  Also consider using topical Voltaren gel which you can get at a local pharmacy for this kind of joint pain.

## 2020-03-23 NOTE — Assessment & Plan Note (Signed)
>>  ASSESSMENT AND PLAN FOR DIABETES TYPE 2, CONTROLLED WRITTEN ON 03/23/2020  4:28 PM BY Pearla Dubonnet, MEDICAL STUDENT  Well-controlled with diet. A1c 6.2 today. - Continue excellent diet control - Recommend adding exercise. 30 min 5 days/week

## 2020-03-23 NOTE — Assessment & Plan Note (Signed)
Well-controlled with diet. A1c 6.2 today. - Continue excellent diet control - Recommend adding exercise. 30 min 5 days/week

## 2020-03-23 NOTE — Assessment & Plan Note (Signed)
Suboptimal control on current regimen. Hesitant to increase carvedilol for concern for bradycardia.  - Increase irbesartan to 300mg  daily - Will check BMP in 1 week

## 2020-03-23 NOTE — Progress Notes (Addendum)
SUBJECTIVE:   CHIEF COMPLAINT / HPI:  Hyperpigmented patch of RLE Patient has noticed a 3-4cm hyperpigmented patch developing on his later RLE for several months. He denies any trauma to the area but thinks he may have been bitten by a bug there but can't say for sure. It is intermittently itchy and not painful. He does have a history of eczema.   Diabetes: Currently controlling his DM with diet/exercise. A1c 6.2 today, well within his goal range, though he reports that would personally like this to be lower.   Hypertension:  BP 130/88 today. Taking Irbesartan, Carvedilol, and amlodipine. No issues with any of these meds.   Hyperlipidemia: Patient currently not taking any medication for his HLD. Has been seen at the lipid clinic previously as he has adverse reactions to standard cholesterol meds. His last visit there was in October and he was prescribed Zetia and Pravastatin, but these made him feel bad so he has not been taking them. Lipid clinic had pended a future lab order for [place] to draw lipid panel today.   Health Maintenance:  Wants his COVID booster today  Defers TDaP to next visit  PERTINENT  PMH / PSH: Eczema, diabetes, CAD, TIA, HFpEF  OBJECTIVE:   BP 130/88   Pulse 72   Wt (!) 320 lb (145.2 kg)   SpO2 96%   BMI 50.12 kg/m   Physical Exam Constitutional:      General: He is not in acute distress. Cardiovascular:     Rate and Rhythm: Normal rate and regular rhythm.     Pulses: Normal pulses.  Pulmonary:     Effort: Pulmonary effort is normal.     Breath sounds: Normal breath sounds.  Skin:    Comments: Well-defined 3-4cm patch of hyperpigmented, leathery skin on lateral RLE with no overlying scale. Non-tender. Slight marginal erythema. Photo in chart.   Neurological:     Mental Status: He is alert.      ASSESSMENT/PLAN:   Hyperpigmented Patch While it is difficult to definitively diagnose these skin changes, they seem most consistent with lichen simplex  chronicus, especially given his history of eczema. He has home Kenalog 0.5% for his eczema. I expect that this spot will respond well to BID Kenalog treatment. - Apply Kenalog to affected area BID  - If improved after 1 week, continue treatment  - If no improvement, d/c Kenalog  Diabetes type 2, controlled Well-controlled with diet. A1c 6.2 today. - Continue excellent diet control - Recommend adding exercise. 30 min 5 days/week  Hypertension associated with diabetes ([place]) Suboptimal control on current regimen. Hesitant to increase carvedilol for concern for bradycardia.  - Increase irbesartan to 300mg  daily - Will check BMP in 1 week   HLD (hyperlipidemia) Not on any meds as of now due to intolerance. Follows with Lipid clinic. - Will recheck lipid panel today - Follow-up with lipid clinic    Health Maintenance - COVID booster vaccine administered in clinic  Pearla Dubonnet, [place]    I was present for the physical exam and medical decision making for the above-noted encounter.  I agree with the documentation by Terri Piedra with the following additions:  Hyperpigmented, lichenified rash Possibly lichen simplex chronicus.  I am not positive of this diagnosis.  I think it is reasonable to start with a topical steroid to see if it responds.  If this does not respond to steroid treatment, biopsy would be appropriate.  After discussion  with Dr. Andria Frames, biopsy would be helpful to rule out melanoma based on appearance in the photo.  Diabetes Well-controlled.  Diet controlled at this time.  No changes at this time.  Hypertension Elevated diastolic pressures today. -Increase irbesartan to 300 mg daily -Follow-up BMP today -Follow-up additional BMP in 1-2 weeks  Hyperlipidemia Not currently taking any medications.  He was told to take simvastatin and Zetia during his last appointment with the lipid clinic.  We will check a lipid panel  today.  He was also encouraged to schedule an appointment with the lipid clinic because he is due for follow-up. -Follow-up with the panel.  Matilde Haymaker, MD

## 2020-03-24 LAB — LIPID PANEL
Chol/HDL Ratio: 7.7 ratio — ABNORMAL HIGH (ref 0.0–5.0)
Cholesterol, Total: 253 mg/dL — ABNORMAL HIGH (ref 100–199)
HDL: 33 mg/dL — ABNORMAL LOW (ref >39)
LDL Chol Calc (NIH): 186 mg/dL — ABNORMAL HIGH (ref 0–99)
Triglycerides: 179 mg/dL — ABNORMAL HIGH (ref 0–149)
VLDL Cholesterol Cal: 34 mg/dL (ref 5–40)

## 2020-03-24 LAB — BASIC METABOLIC PANEL
BUN/Creatinine Ratio: 18 (ref 10–24)
BUN: 20 mg/dL (ref 8–27)
CO2: 22 mmol/L (ref 20–29)
Calcium: 10.4 mg/dL — ABNORMAL HIGH (ref 8.6–10.2)
Chloride: 103 mmol/L (ref 96–106)
Creatinine, Ser: 1.12 mg/dL (ref 0.76–1.27)
GFR calc Af Amer: 80 mL/min/{1.73_m2} (ref >59)
GFR calc non Af Amer: 69 mL/min/{1.73_m2} (ref >59)
Glucose: 87 mg/dL (ref 65–99)
Potassium: 4.3 mmol/L (ref 3.5–5.2)
Sodium: 141 mmol/L (ref 134–144)

## 2020-04-09 ENCOUNTER — Observation Stay (HOSPITAL_COMMUNITY)
Admission: EM | Admit: 2020-04-09 | Discharge: 2020-04-11 | Disposition: A | Discharge status: Home / Self Care | Payer: PPO | Attending: Emergency Medicine | Admitting: Emergency Medicine

## 2020-04-09 ENCOUNTER — Other Ambulatory Visit: Payer: Self-pay

## 2020-04-09 ENCOUNTER — Encounter (HOSPITAL_COMMUNITY): Payer: Self-pay | Admitting: Emergency Medicine

## 2020-04-09 ENCOUNTER — Telehealth: Payer: Self-pay | Admitting: Student in an Organized Health Care Education/Training Program

## 2020-04-09 DIAGNOSIS — K5733 Diverticulitis of large intestine without perforation or abscess with bleeding: Secondary | ICD-10-CM

## 2020-04-09 DIAGNOSIS — K5790 Diverticulosis of intestine, part unspecified, without perforation or abscess without bleeding: Secondary | ICD-10-CM | POA: Diagnosis not present

## 2020-04-09 DIAGNOSIS — Z20822 Contact with and (suspected) exposure to covid-19: Secondary | ICD-10-CM | POA: Insufficient documentation

## 2020-04-09 DIAGNOSIS — Z79899 Other long term (current) drug therapy: Secondary | ICD-10-CM | POA: Diagnosis not present

## 2020-04-09 DIAGNOSIS — J45909 Unspecified asthma, uncomplicated: Secondary | ICD-10-CM | POA: Insufficient documentation

## 2020-04-09 DIAGNOSIS — Z7984 Long term (current) use of oral hypoglycemic drugs: Secondary | ICD-10-CM | POA: Insufficient documentation

## 2020-04-09 DIAGNOSIS — R109 Unspecified abdominal pain: Secondary | ICD-10-CM | POA: Diagnosis not present

## 2020-04-09 DIAGNOSIS — K922 Gastrointestinal hemorrhage, unspecified: Principal | ICD-10-CM

## 2020-04-09 DIAGNOSIS — I5030 Unspecified diastolic (congestive) heart failure: Secondary | ICD-10-CM | POA: Insufficient documentation

## 2020-04-09 DIAGNOSIS — Z7982 Long term (current) use of aspirin: Secondary | ICD-10-CM | POA: Diagnosis not present

## 2020-04-09 DIAGNOSIS — E119 Type 2 diabetes mellitus without complications: Secondary | ICD-10-CM | POA: Insufficient documentation

## 2020-04-09 DIAGNOSIS — I110 Hypertensive heart disease with heart failure: Secondary | ICD-10-CM | POA: Insufficient documentation

## 2020-04-09 DIAGNOSIS — R55 Syncope and collapse: Secondary | ICD-10-CM

## 2020-04-09 DIAGNOSIS — Z87891 Personal history of nicotine dependence: Secondary | ICD-10-CM | POA: Insufficient documentation

## 2020-04-09 DIAGNOSIS — Z8673 Personal history of transient ischemic attack (TIA), and cerebral infarction without residual deficits: Secondary | ICD-10-CM | POA: Insufficient documentation

## 2020-04-09 DIAGNOSIS — I2510 Atherosclerotic heart disease of native coronary artery without angina pectoris: Secondary | ICD-10-CM | POA: Diagnosis not present

## 2020-04-09 DIAGNOSIS — R1030 Lower abdominal pain, unspecified: Secondary | ICD-10-CM | POA: Diagnosis present

## 2020-04-09 HISTORY — DX: Diverticulitis of intestine, part unspecified, without perforation or abscess without bleeding: K57.92

## 2020-04-09 LAB — CBC
HCT: 39.2 % (ref 39.0–52.0)
Hemoglobin: 12.6 g/dL — ABNORMAL LOW (ref 13.0–17.0)
MCH: 26.8 pg (ref 26.0–34.0)
MCHC: 32.1 g/dL (ref 30.0–36.0)
MCV: 83.4 fL (ref 80.0–100.0)
Platelets: 306 10*3/uL (ref 150–400)
RBC: 4.7 MIL/uL (ref 4.22–5.81)
RDW: 15.2 % (ref 11.5–15.5)
WBC: 9.4 10*3/uL (ref 4.0–10.5)
nRBC: 0 % (ref 0.0–0.2)

## 2020-04-09 LAB — COMPREHENSIVE METABOLIC PANEL
ALT: 31 U/L (ref 0–44)
AST: 28 U/L (ref 15–41)
Albumin: 3.7 g/dL (ref 3.5–5.0)
Alkaline Phosphatase: 71 U/L (ref 38–126)
Anion gap: 12 (ref 5–15)
BUN: 18 mg/dL (ref 8–23)
CO2: 23 mmol/L (ref 22–32)
Calcium: 9.1 mg/dL (ref 8.9–10.3)
Chloride: 105 mmol/L (ref 98–111)
Creatinine, Ser: 1.03 mg/dL (ref 0.61–1.24)
GFR, Estimated: >60 mL/min (ref >60)
Glucose, Bld: 105 mg/dL — ABNORMAL HIGH (ref 70–99)
Potassium: 4.1 mmol/L (ref 3.5–5.1)
Sodium: 140 mmol/L (ref 135–145)
Total Bilirubin: 0.7 mg/dL (ref 0.3–1.2)
Total Protein: 7.3 g/dL (ref 6.5–8.1)

## 2020-04-09 LAB — URINALYSIS, ROUTINE W REFLEX MICROSCOPIC
Bacteria, UA: NONE SEEN
Bilirubin Urine: NEGATIVE
Glucose, UA: NEGATIVE mg/dL
Ketones, ur: NEGATIVE mg/dL
Leukocytes,Ua: NEGATIVE
Nitrite: NEGATIVE
Protein, ur: NEGATIVE mg/dL
Specific Gravity, Urine: 1.021 (ref 1.005–1.030)
pH: 5 (ref 5.0–8.0)

## 2020-04-09 LAB — LIPASE, BLOOD: Lipase: 27 U/L (ref 11–51)

## 2020-04-09 NOTE — ED Notes (Signed)
Pt c/o light headedness and feeling faint in lobby. Vitals reassessed pt's bp dropped from 132/92 to 93/64. Pt lethargic and diaphoretic

## 2020-04-09 NOTE — Telephone Encounter (Signed)
**  After Hours/ Emergency Line Call*  Patient with history of hospitalization for diverticulITIS, per patient. He says that episode started just like what he is experiencing now.  In the past hour has had 2 episodes of BRBPR with not able to make it to the restroom on the second occurrence.  Denies abdominal pain, fever, N/V.  No lightheadedness, CP, SOB.  Per hx given, more likely diverticuLOSIS bleed and potentially a large amount of blood loss. Recommended patient be evaluated. He will come to the ED. Recommended EMS transport if feeling lightheaded or dizzy at all from blood loss.

## 2020-04-09 NOTE — ED Triage Notes (Signed)
Pt reports generalized abd pain since 1pm.   Denies nausea, vomiting, and diarrhea.  States he believes it is his diverticulitis.

## 2020-04-10 ENCOUNTER — Emergency Department (HOSPITAL_COMMUNITY): Payer: PPO

## 2020-04-10 DIAGNOSIS — R109 Unspecified abdominal pain: Secondary | ICD-10-CM | POA: Diagnosis not present

## 2020-04-10 DIAGNOSIS — D500 Iron deficiency anemia secondary to blood loss (chronic): Secondary | ICD-10-CM | POA: Diagnosis not present

## 2020-04-10 DIAGNOSIS — K5733 Diverticulitis of large intestine without perforation or abscess with bleeding: Secondary | ICD-10-CM | POA: Diagnosis not present

## 2020-04-10 DIAGNOSIS — K922 Gastrointestinal hemorrhage, unspecified: Secondary | ICD-10-CM | POA: Diagnosis not present

## 2020-04-10 DIAGNOSIS — R55 Syncope and collapse: Secondary | ICD-10-CM | POA: Diagnosis not present

## 2020-04-10 DIAGNOSIS — K5731 Diverticulosis of large intestine without perforation or abscess with bleeding: Secondary | ICD-10-CM | POA: Diagnosis not present

## 2020-04-10 DIAGNOSIS — K5790 Diverticulosis of intestine, part unspecified, without perforation or abscess without bleeding: Secondary | ICD-10-CM | POA: Diagnosis not present

## 2020-04-10 DIAGNOSIS — K625 Hemorrhage of anus and rectum: Secondary | ICD-10-CM | POA: Diagnosis not present

## 2020-04-10 LAB — BASIC METABOLIC PANEL
Anion gap: 10 (ref 5–15)
BUN: 19 mg/dL (ref 8–23)
CO2: 21 mmol/L — ABNORMAL LOW (ref 22–32)
Calcium: 8.6 mg/dL — ABNORMAL LOW (ref 8.9–10.3)
Chloride: 107 mmol/L (ref 98–111)
Creatinine, Ser: 1.05 mg/dL (ref 0.61–1.24)
GFR, Estimated: >60 mL/min (ref >60)
Glucose, Bld: 138 mg/dL — ABNORMAL HIGH (ref 70–99)
Potassium: 4.2 mmol/L (ref 3.5–5.1)
Sodium: 138 mmol/L (ref 135–145)

## 2020-04-10 LAB — CBC
HCT: 36.9 % — ABNORMAL LOW (ref 39.0–52.0)
Hemoglobin: 11.4 g/dL — ABNORMAL LOW (ref 13.0–17.0)
MCH: 26.3 pg (ref 26.0–34.0)
MCHC: 30.9 g/dL (ref 30.0–36.0)
MCV: 85 fL (ref 80.0–100.0)
Platelets: 276 10*3/uL (ref 150–400)
RBC: 4.34 MIL/uL (ref 4.22–5.81)
RDW: 15.2 % (ref 11.5–15.5)
WBC: 10.9 10*3/uL — ABNORMAL HIGH (ref 4.0–10.5)
nRBC: 0 % (ref 0.0–0.2)

## 2020-04-10 LAB — SARS CORONAVIRUS 2 (TAT 6-24 HRS): SARS Coronavirus 2: NEGATIVE

## 2020-04-10 LAB — TYPE AND SCREEN
ABO/RH(D): A POS
Antibody Screen: NEGATIVE

## 2020-04-10 LAB — POC OCCULT BLOOD, ED: Fecal Occult Bld: POSITIVE — AB

## 2020-04-10 LAB — HIV ANTIBODY (ROUTINE TESTING W REFLEX): HIV Screen 4th Generation wRfx: NONREACTIVE

## 2020-04-10 MED ORDER — ACETAMINOPHEN 500 MG PO TABS: 500.0000 mg | ORAL_TABLET | Freq: Four times a day (QID) | ORAL | Status: DC | PRN

## 2020-04-10 MED ORDER — ONDANSETRON HCL 4 MG/2ML IJ SOLN: 4.0000 mg | Freq: Once | INTRAMUSCULAR | Status: DC

## 2020-04-10 MED ORDER — IRBESARTAN 150 MG PO TABS
150.0000 mg | ORAL_TABLET | Freq: Every day | ORAL | Status: DC
  Filled 2020-04-10 (×3): qty 1

## 2020-04-10 MED ORDER — ALLOPURINOL 100 MG PO TABS
100.0000 mg | ORAL_TABLET | Freq: Every day | ORAL | Status: DC
Start: 2020-04-10 — End: 2020-04-11
  Filled 2020-04-10 (×2): qty 1

## 2020-04-10 MED ORDER — PANTOPRAZOLE SODIUM 40 MG PO TBEC
80.0000 mg | DELAYED_RELEASE_TABLET | Freq: Every day | ORAL | Status: DC
  Administered 2020-04-10: 80 mg via ORAL
  Filled 2020-04-10 (×2): qty 2

## 2020-04-10 MED ORDER — IOHEXOL 300 MG/ML  SOLN
100.0000 mL | Freq: Once | INTRAMUSCULAR | Status: AC | PRN
  Administered 2020-04-10: 100 mL via INTRAVENOUS

## 2020-04-10 MED ORDER — SODIUM CHLORIDE 0.9 % IV BOLUS
1000.0000 mL | Freq: Once | INTRAVENOUS | Status: AC
  Administered 2020-04-10: 1000 mL via INTRAVENOUS

## 2020-04-10 MED ORDER — FENTANYL CITRATE (PF) 100 MCG/2ML IJ SOLN
50.0000 ug | Freq: Once | INTRAMUSCULAR | Status: AC
  Administered 2020-04-10: 50 ug via INTRAVENOUS
  Filled 2020-04-10: qty 2

## 2020-04-10 MED ORDER — AMLODIPINE BESYLATE 10 MG PO TABS
10.0000 mg | ORAL_TABLET | Freq: Every day | ORAL | Status: DC
  Filled 2020-04-10: qty 2

## 2020-04-10 MED ORDER — CARVEDILOL 25 MG PO TABS
25.0000 mg | ORAL_TABLET | Freq: Every day | ORAL | Status: DC
Start: 2020-04-10 — End: 2020-04-11
  Filled 2020-04-10: qty 2

## 2020-04-10 NOTE — ED Notes (Signed)
Patient transported to CT 

## 2020-04-10 NOTE — ED Notes (Signed)
RN attempted report 

## 2020-04-10 NOTE — ED Notes (Signed)
Paged Dr Domenic Schwab for RN

## 2020-04-10 NOTE — ED Notes (Signed)
RN spoke to MD, MD stated to put in reg diet order.

## 2020-04-10 NOTE — H&P (Signed)
[facility] Admission History and Physical Service Pager: [phone]  Patient name: Alex [name] Medical record number: [id number] Date of birth: August 24, 1955 Age: 65 y.o. Gender: male  Primary Care Provider: Matilde Haymaker, MD Consultants: GI Code Status: Full code Preferred Emergency Contact: Alex [name],Alex [name] (Sister)  [phone]  Chief Complaint: Bloody stools  Assessment and Plan: Alex [name] is a 65 y.o. male presenting with bright red blood per rectum. PMH is significant for  diverticulosis, T2DM, GERD, hypertension, CAD, HLD, OSA on CPAP, gout, OA, Hx of TIA  Rectal bleeding Patient had 3 episodes of rectal bleeding starting at 1 PM on 1/29.  He presented to the emergency department and while in the waiting room got up to use the restroom and take his nightly blood pressure medications.  After he left to the restroom he became diaphoretic and hypotensive in the 90s/60s.  Of note patient was admitted for rectal bleeding in August 2020.Colonoscopy at that time showed diverticulosis in the sigmoid colon and descending colon.  There was a 3 mm polyp in the descending colon which was removed in a form millimeter polyp in the ascending colon which was also removed.  There was suspected that the cause of his bleed was a diverticular bleed.  Vital signs today were stable until the patient had his hypotensive episode.  His pulse rate has remained in the 60s throughout our exam.  Hemoglobin mildly  decreased at 12.6 with baseline being around 13.  CT showed diverticulosis without evidence of diverticulitis.  Physical exam was reassuring with mild generalized abdominal tenderness, positive bowel sounds.  Emergency room provider performed rectal exam just prior to my arrival and reported significant amount of frank blood on his glove.  Most likely source of hematochezia is diverticulosis bleed. -Admit to inpatient teaching service with Dr. Andria Frames as attending -GI consult in  the morning, appreciate recommendations -Seen -Morning BMP -N.p.o. with sips with meds -Continue maintenance IV fluids -P.o. 80 mg Protonix -Given near syncopal episode continuous cardiac monitoring for 12 hours  Near syncope After arrival patient got up to use the restroom.  He took his blood pressure medications, had a bloody bowel movement, and on his way back to [place] he realized he was clammy and felt lightheaded.  His blood pressure was checked and was in the 90s/60s.  This most likely occurred due to a combination of factors including vasovagal along with his blood pressure medications.  The differential also includes hypovolemia due to his active bleed but this is less likely at this time. -Continue to monitor for worsening bleeding. -Continuous cardiac monitoring  HTN On arrival patient had normotensive blood pressure.  He did have an episode of hypotension which has resolved with IV hydration.  Home medications include Coreg 25 mg twice daily, irbesartan 150 mg daily, amlodipine 10 mg daily.  Blood pressures on exam were normotensive at 120/88. -Continue home blood pressure medications at this time. -Norvasc is 10 mg at night so consider holding it if patient's blood pressures  T2 DM Patient reports that he is not on Metformin for this although it helped his hemoglobin A1c he was having side effects and could not take it.  Hemoglobin A1c on 1/12 was 6.2.  Glucose on arrival today was 105. -Monitor with morning BMPs  GERD Home medications include omeprazole 40 mg daily. -Protonix 80 mg p.o. per formulary -Continue to monitor  CAD 2002 which showed 25% stenosis of the distal RCA.  Patient also stress test in 2013  which was normal and showed no evidence of ischemia.Most recent echo showed LVEF of 50-55% with no abnormal wall motion consistent with grade 1 diastolic dysfunction.  Mild left atrial dilation. -Medication includes ASA 81 mg daily, holding that at this time due to GI  bleed  OSA on CPAP Patient reports good compliance with CPAP -C8 PAP nightly  Gout Patient takes allopurinol daily and colchicine as needed for flares.  No current issues at this time -Continue allopurinol   FEN/GI: N.p.o. except for water and sips with meds Prophylaxis: SCDs  Disposition: Admit to inpatient teaching service with Dr. Andria Frames as attending  History of Present Illness:  [name] is a 65 y.o. male presenting with bright red blood per rectum.  Patient reports that around 1 PM on 1/29 he had a bowel movement with a significant amount of blood.  He had 2 more bowel movements after that with significant amount of bright red blood in his stool.  He reports that he has had issues with this in the past so he was concerned so he called the after-hours telephone line for our clinic.  At time he was having lightheadedness and felt dizzy so EMS was called.  Review Of Systems: Per HPI with the following additions:  Review of Systems  Constitutional: Positive for chills and diaphoresis. Negative for activity change, appetite change and fever.  HENT: Negative for congestion and rhinorrhea.   Eyes: Negative for visual disturbance.  Respiratory: Negative for cough and shortness of breath.   Cardiovascular: Negative for chest pain.  Gastrointestinal: Positive for abdominal pain, blood in stool and constipation. Negative for diarrhea, nausea and vomiting.  Genitourinary: Negative for decreased urine volume and dysuria.  Musculoskeletal: Negative for arthralgias.  Neurological: Positive for dizziness and light-headedness. Negative for headaches.     Patient Active Problem List   Diagnosis Date Noted  . GI bleed 04/10/2020  . Myalgia due to statin 12/22/2019  . Pure hypercholesterolemia 12/22/2019  . Diverticulitis 06/11/2019  . Pain of right inguinal ring 05/18/2019  . Pain of left inguinal ring 05/18/2019  . History of GI diverticular bleed 11/14/2018  . Anemia   .  Diabetes mellitus without complication ([place])   . Rectal bleeding 11/08/2018  . Dehydration   . Postural dizziness with presyncope 09/05/2017  . Pre-syncope   . Ingrown toenail of left foot with infection 11/02/2016  . Screen for STD (sexually transmitted disease) 08/14/2016  . Fatigue 11/12/2014  . Vision changes 07/23/2014  . Right foot strain 05/14/2014  . GERD (gastroesophageal reflux disease) 12/17/2013  . Seasonal allergies 06/15/2013  . Dyspnea 05/16/2013  . Headache 12/03/2012  . Rash and nonspecific skin eruption 10/07/2012  . Internal hemorrhoids with other complication 56/25/6389  . Coronary artery disease 07/14/2012  . OSA (obstructive sleep apnea) 06/02/2012  . Chest pain 05/06/2012  . Diastolic heart failure ([place]) 04/02/2012  . History of tobacco use 02/22/2012  . Osteoarthritis 02/22/2012  . History of TIA (transient ischemic attack) 02/22/2012  . Obesity 07/27/2009  . INTERMITTENT CLAUDICATION 02/09/2009  . [place] DISEASE, LUMBAR SPINE 08/04/2008  . ESOPHAGEAL STRICTURE 05/20/2008  . Diabetes type 2, controlled ([place]) 02/23/2008  . HLD (hyperlipidemia) 02/23/2008  . Gout 02/23/2008  . Hypertension associated with diabetes ([place]) 02/23/2008    Past Medical History: Past Medical History:  Diagnosis Date  . Asthma   . CAD (coronary artery disease)    Cardiac cath 2002 with 25% distal RCA stenosis.   . DDD (degenerative disc disease),  lumbar   . Diabetes mellitus ([place])   . Diverticulitis   . Esophageal stricture   . Gastritis 2010  . GERD (gastroesophageal reflux disease)   . Gout   . HTN (hypertension)   . Hyperlipidemia   . Insomnia   . MI (myocardial infarction) ([place]) 2009   no stent placement  . OA (osteoarthritis)   . Obesity   . OSA on CPAP   . Seasonal allergies   . TIA (transient ischemic attack) 2013    Past Surgical History: Past Surgical History:  Procedure Laterality Date  . COLONOSCOPY WITH PROPOFOL N/A 11/11/2018    Procedure: COLONOSCOPY WITH PROPOFOL;  Surgeon: Thornton Park, MD;  Location: [facility];  Service: Gastroenterology;  Laterality: N/A;  . ESOPHAGOGASTRODUODENOSCOPY     multiple  . NASAL SINUS SURGERY    . POLYPECTOMY  11/11/2018   Procedure: POLYPECTOMY;  Surgeon: Thornton Park, MD;  Location: [facility];  Service: Gastroenterology;;  . VASECTOMY      Social History: Social History   Tobacco Use  . Smoking status: Former Smoker    Packs/day: 1.50    Years: 10.00    Pack years: 15.00    Types: Cigarettes    Quit date: 11/06/1991    Years since quitting: 28.4  . Smokeless tobacco: Never Used  . Tobacco comment: quit 1993  Substance Use Topics  . Alcohol use: No  . Drug use: No    Please also refer to relevant sections of EMR.  Family History: Family History  Problem Relation Age of Onset  . Heart attack Mother   . Heart failure Mother   . Colon cancer Neg Hx   . Esophageal cancer Neg Hx   . Rectal cancer Neg Hx   . Stomach cancer Neg Hx     Allergies and Medications: Allergies  Allergen Reactions  . Atorvastatin Other (See Comments)    Subjective myalgias. Gave 2 trials, developed myalgias which resolved after cessation of medication.  Muscle cramps were 9/10 discomfort  . Codeine Nausea Only  . Shellfish Allergy Other (See Comments)    Activates asthma   No current facility-administered medications on file prior to encounter.   Current Outpatient Medications on File Prior to Encounter  Medication Sig Dispense Refill  . ACCU-CHEK FASTCLIX LANCETS MISC 1 Device by Does not apply route daily. 102 each 3  . acetaminophen (TYLENOL) 500 MG tablet Take 1 tablet (500 mg total) by mouth every 6 (six) hours as needed for headache (pain). 30 tablet 10  . allopurinol (ZYLOPRIM) 100 MG tablet Take 1 tablet (100 mg total) by mouth daily. 60 tablet 3  . amLODipine (NORVASC) 10 MG tablet TAKE 1 TABLET BY MOUTH ONCE DAILY WITH  SUPPER 90 tablet 3  . aspirin EC 81 MG  tablet Take 81 mg by mouth daily with supper.    . Blood Glucose Monitoring Suppl (ACCU-CHEK AVIVA CONNECT) W/DEVICE KIT 1 Device by Does not apply route daily. 1 kit 0  . carvedilol (COREG) 25 MG tablet Take 1 tablet (25 mg total) by mouth daily with supper. 90 tablet 3  . colchicine 0.6 MG tablet Take 1 tablet (0.6 mg total) by mouth daily. 6 tablet 0  . ezetimibe (ZETIA) 10 MG tablet Take 1 tablet (10 mg total) by mouth daily. 30 tablet 1  . Ferrous Sulfate (IRON) 325 (65 Fe) MG TABS Take 1 tablet (325 mg total) by mouth every morning. 90 tablet 3  . glucose blood test strip 1 each by  Other route daily. Use as instructed 100 each 12  . irbesartan (AVAPRO) 300 MG tablet Take 0.5 tablets (150 mg total) by mouth daily. 60 tablet 2  . Lancets (FREESTYLE) lancets Use as instructed 100 each 12  . naproxen (NAPROSYN) 500 MG tablet TAKE 1 TABLET BY MOUTH ONCE DAILY AS NEEDED FOR MODERATE PAIN 30 tablet 0  . omeprazole (PRILOSEC) 40 MG capsule TAKE 1 CAPSULE BY MOUTH ONCE DAILY WITH SUPPER. 90 capsule 3  . pravastatin (PRAVACHOL) 20 MG tablet Take 1 tablet (20 mg total) by mouth every evening. 30 tablet 1  . triamcinolone ointment (KENALOG) 0.5 % Apply to affected area. 15 g 0    Objective: BP 120/88   Pulse 65   Temp 98.4 F (36.9 C) (Oral)   Resp 18   Ht '5\' 7"'  (1.702 m)   Wt (!) 147.4 kg   SpO2 95%   BMI 50.90 kg/m  Physical Exam Vitals and nursing note reviewed.  Constitutional:      General: He is not in acute distress.    Appearance: He is obese. He is not ill-appearing.  HENT:     Head: Normocephalic and atraumatic.     Mouth/Throat:     Mouth: Mucous membranes are moist.     Pharynx: Oropharynx is clear.  Eyes:     Extraocular Movements: Extraocular movements intact.     Pupils: Pupils are equal, round, and reactive to light.  Cardiovascular:     Rate and Rhythm: Normal rate and regular rhythm.     Heart sounds: No murmur heard.   Pulmonary:     Effort: Pulmonary effort  is normal.     Breath sounds: Normal breath sounds.  Abdominal:     General: Bowel sounds are normal. There is no distension.     Palpations: Abdomen is soft.     Tenderness: There is generalized abdominal tenderness.     Hernia: No hernia is present.  Genitourinary:    Comments: Rectal exam performed just prior to my arrival.  ED provider reports gross blood per rectum Skin:    General: Skin is warm and dry.     Capillary Refill: Capillary refill takes less than 2 seconds.  Neurological:     General: No focal deficit present.     Mental Status: He is alert.  Psychiatric:        Mood and Affect: Mood normal.        Behavior: Behavior normal.      Labs and Imaging: CBC BMET  Recent Labs  Lab 04/09/20 1827  WBC 9.4  HGB 12.6*  HCT 39.2  PLT 306   Recent Labs  Lab 04/09/20 1827  NA 140  K 4.1  CL 105  CO2 23  BUN 18  CREATININE 1.03  GLUCOSE 105*  CALCIUM 9.1      **CT ABDOMEN PELVIS W CONTRAST  Result Date: 04/10/2020 CLINICAL DATA:  Diverticulitis suspected. Abdominal pain since 1 a.m. EXAM: CT ABDOMEN AND PELVIS WITH CONTRAST TECHNIQUE: Multidetector CT imaging of the abdomen and pelvis was performed using the standard protocol following bolus administration of intravenous contrast. CONTRAST:  156m OMNIPAQUE IOHEXOL 300 MG/ML  SOLN COMPARISON:  CT abdomen pelvis 05/27/2019 FINDINGS: Lower chest: Subsegmental atelectasis of bilateral lower lobes. Hepatobiliary: Subcentimeter hypodensity within left hepatic lobe is too small to characterize. Otherwise no focal liver abnormality. Calcified gallstones within the gallbladder lumen. No gallbladder wall thickening or pericholecystic fluid. No biliary dilatation. Pancreas: No focal lesion. Normal pancreatic contour.  No surrounding inflammatory changes. No main pancreatic ductal dilatation. Spleen: Normal in size without focal abnormality. Adrenals/Urinary Tract: No adrenal nodule bilaterally. Bilateral kidneys enhance  symmetrically. There is a stable 4.8 cm hypodense lesion within the inferior pole of the left kidney that demonstrates a density of 22 Hounsfield units. Several other similar in size hypodensities demonstrate lesions demonstrate fluid density and likely represent simple renal cysts. No hydronephrosis. No hydroureter. The urinary bladder is unremarkable. Stomach/Bowel: Stomach is within normal limits. No evidence of bowel wall thickening or dilatation. Sigmoid diverticulosis. Appendix appears normal. Vascular/Lymphatic: No abdominal aorta or iliac aneurysm. Mild atherosclerotic plaque. No abdominal, pelvic, or inguinal lymphadenopathy. Reproductive: Prostate is unremarkable. Other: No intraperitoneal free fluid. No intraperitoneal free gas. No organized fluid collection. Musculoskeletal: No abdominal wall hernia or abnormality No suspicious lytic or blastic osseous lesions. No acute displaced fracture. Multilevel mild degenerative changes of the spine. IMPRESSION: 1. Sigmoid diverticulosis with no definite findings to suggest acute diverticulitis. 2. Cholelithiasis with no acute cholecystitis. 3. Indeterminate stable in appearance 4.8 cm lesion within the left kidney could represent a complex cystic lesion versus solid masses. Consider MRI renal protocol for further evaluation. Electronically Signed   By: Iven Finn M.D.   On: 04/10/2020 01:13    Gifford Shave, MD 04/10/2020, 3:13 AM PGY-2, [place] Intern pager: [phone], text pages welcome

## 2020-04-10 NOTE — ED Provider Notes (Signed)
[facility] EMERGENCY DEPARTMENT Provider Note   CSN: [id number] Arrival date & time: 04/09/20  1656     History Chief Complaint  Patient presents with  . Abdominal Pain    Alex [name] is a 65 y.o. male.  Patient presents to the emergency department with a chief complaint of of lower abdominal pain and bloody stools.  He states that this feels similar to when he had diverticulitis in the past.  He states that the pain started today around 1 PM.  He denies any fevers, chills, cough.  Denies any treatments prior to arrival.  He states that his stool has been mixed with bright red blood.  He denies any other associated symptoms.  Of note, patient felt lightheaded and dizzy in the waiting room, and was brought back to an exam room.  The history is provided by the patient. No language interpreter was used.       Past Medical History:  Diagnosis Date  . Asthma   . CAD (coronary artery disease)    Cardiac cath 2002 with 25% distal RCA stenosis.   . DDD (degenerative disc disease), lumbar   . Diabetes mellitus ([place])   . Diverticulitis   . Esophageal stricture   . Gastritis 2010  . GERD (gastroesophageal reflux disease)   . Gout   . HTN (hypertension)   . Hyperlipidemia   . Insomnia   . MI (myocardial infarction) ([place]) 2009   no stent placement  . OA (osteoarthritis)   . Obesity   . OSA on CPAP   . Seasonal allergies   . TIA (transient ischemic attack) 2013    Patient Active Problem List   Diagnosis Date Noted  . Myalgia due to statin 12/22/2019  . Pure hypercholesterolemia 12/22/2019  . Diverticulitis 06/11/2019  . Pain of right inguinal ring 05/18/2019  . Pain of left inguinal ring 05/18/2019  . History of GI diverticular bleed 11/14/2018  . Anemia   . Diabetes mellitus without complication ([place])   . Rectal bleeding 11/08/2018  . Dehydration   . Postural dizziness with presyncope 09/05/2017  . Pre-syncope   . Ingrown toenail of left foot  with infection 11/02/2016  . Screen for STD (sexually transmitted disease) 08/14/2016  . Fatigue 11/12/2014  . Vision changes 07/23/2014  . Right foot strain 05/14/2014  . GERD (gastroesophageal reflux disease) 12/17/2013  . Seasonal allergies 06/15/2013  . Dyspnea 05/16/2013  . Headache 12/03/2012  . Rash and nonspecific skin eruption 10/07/2012  . Internal hemorrhoids with other complication 48/03/6551  . Coronary artery disease 07/14/2012  . OSA (obstructive sleep apnea) 06/02/2012  . Chest pain 05/06/2012  . Diastolic heart failure ([place]) 04/02/2012  . History of tobacco use 02/22/2012  . Osteoarthritis 02/22/2012  . History of TIA (transient ischemic attack) 02/22/2012  . Obesity 07/27/2009  . INTERMITTENT CLAUDICATION 02/09/2009  . [place] DISEASE, LUMBAR SPINE 08/04/2008  . ESOPHAGEAL STRICTURE 05/20/2008  . Diabetes type 2, controlled ([place]) 02/23/2008  . HLD (hyperlipidemia) 02/23/2008  . Gout 02/23/2008  . Hypertension associated with diabetes ([place]) 02/23/2008    Past Surgical History:  Procedure Laterality Date  . COLONOSCOPY WITH PROPOFOL N/A 11/11/2018   Procedure: COLONOSCOPY WITH PROPOFOL;  Surgeon: Thornton Park, MD;  Location: [facility];  Service: Gastroenterology;  Laterality: N/A;  . ESOPHAGOGASTRODUODENOSCOPY     multiple  . NASAL SINUS SURGERY    . POLYPECTOMY  11/11/2018   Procedure: POLYPECTOMY;  Surgeon: Thornton Park, MD;  Location: [facility];  Service: Gastroenterology;;  . [name]         Family History  Problem Relation Age of Onset  . Heart attack Mother   . Heart failure Mother   . Colon cancer Neg Hx   . Esophageal cancer Neg Hx   . Rectal cancer Neg Hx   . Stomach cancer Neg Hx     Social History   Tobacco Use  . Smoking status: Former Smoker    Packs/day: 1.50    Years: 10.00    Pack years: 15.00    Types: Cigarettes    Quit date: 11/06/1991    Years since quitting: 28.4  . Smokeless tobacco: Never Used   . Tobacco comment: quit 1993  Substance Use Topics  . Alcohol use: No  . Drug use: No    Home Medications Prior to Admission medications   Medication Sig Start Date End Date Taking? Authorizing Provider  ACCU-CHEK FASTCLIX LANCETS MISC 1 Device by Does not apply route daily. 01/26/14   Leone Haven, MD  acetaminophen (TYLENOL) 500 MG tablet Take 1 tablet (500 mg total) by mouth every 6 (six) hours as needed for headache (pain). 12/22/18   Matilde Haymaker, MD  allopurinol (ZYLOPRIM) 100 MG tablet Take 1 tablet (100 mg total) by mouth daily. 01/29/20   Matilde Haymaker, MD  amLODipine (NORVASC) 10 MG tablet TAKE 1 TABLET BY MOUTH ONCE DAILY WITH  SUPPER 12/07/19   Matilde Haymaker, MD  aspirin EC 81 MG tablet Take 81 mg by mouth daily with supper.    [provider]  Blood Glucose Monitoring Suppl (ACCU-CHEK AVIVA CONNECT) W/DEVICE KIT 1 Device by Does not apply route daily. 12/01/13   Leone Haven, MD  carvedilol (COREG) 25 MG tablet Take 1 tablet (25 mg total) by mouth daily with supper. 04/03/19   Matilde Haymaker, MD  colchicine 0.6 MG tablet Take 1 tablet (0.6 mg total) by mouth daily. 10/29/18   Matilde Haymaker, MD  ezetimibe (ZETIA) 10 MG tablet Take 1 tablet (10 mg total) by mouth daily. 12/22/19 02/20/20  Burnell Blanks, MD  Ferrous Sulfate (IRON) 325 (65 Fe) MG TABS Take 1 tablet (325 mg total) by mouth every morning. 05/26/19   Matilde Haymaker, MD  glucose blood test strip 1 each by Other route daily. Use as instructed 01/26/14   Leone Haven, MD  irbesartan (AVAPRO) 300 MG tablet Take 0.5 tablets (150 mg total) by mouth daily. 03/23/20   Matilde Haymaker, MD  Lancets (FREESTYLE) lancets Use as instructed 11/27/13   Lupita Dawn, MD  naproxen (NAPROSYN) 500 MG tablet TAKE 1 TABLET BY MOUTH ONCE DAILY AS NEEDED FOR MODERATE PAIN 02/10/20   Matilde Haymaker, MD  omeprazole (PRILOSEC) 40 MG capsule TAKE 1 CAPSULE BY MOUTH ONCE DAILY WITH SUPPER. 11/30/19   Matilde Haymaker, MD   pravastatin (PRAVACHOL) 20 MG tablet Take 1 tablet (20 mg total) by mouth every evening. 12/22/19 02/20/20  Burnell Blanks, MD  triamcinolone ointment (KENALOG) 0.5 % Apply to affected area. 02/10/20   Matilde Haymaker, MD    Allergies    Atorvastatin, Codeine, and Shellfish allergy  Review of Systems   Review of Systems  All other systems reviewed and are negative.   Physical Exam Updated Vital Signs BP 93/64 (BP Location: Right Arm)   Pulse 74   Temp 98.4 F (36.9 C) (Oral)   Resp 16   Ht '5\' 7"'  (1.702 m)   Wt (!) 147.4 kg   SpO2 96%  BMI 50.90 kg/m   Physical Exam Vitals and nursing note reviewed.  Constitutional:      Appearance: He is well-developed and well-nourished.  HENT:     Head: Normocephalic and atraumatic.  Eyes:     Conjunctiva/sclera: Conjunctivae normal.  Cardiovascular:     Rate and Rhythm: Normal rate and regular rhythm.     Heart sounds: No murmur heard.   Pulmonary:     Effort: Pulmonary effort is normal. No respiratory distress.     Breath sounds: Normal breath sounds.  Abdominal:     Palpations: Abdomen is soft.     Tenderness: There is abdominal tenderness.     Comments: Generalized lower abdominal tenderness  Genitourinary:    Comments: Gross blood on rectal exam Musculoskeletal:        General: No edema.     Cervical back: Neck supple.  Skin:    General: Skin is warm and dry.  Neurological:     Mental Status: He is alert and oriented to person, place, and time.  Psychiatric:        Mood and Affect: Mood and affect and mood normal.        Behavior: Behavior normal.     ED Results / Procedures / Treatments   Labs (all labs ordered are listed, but only abnormal results are displayed) Labs Reviewed  COMPREHENSIVE METABOLIC PANEL - Abnormal; Notable for the following components:      Result Value   Glucose, Bld 105 (*)    All other components within normal limits  CBC - Abnormal; Notable for the following components:    Hemoglobin 12.6 (*)    All other components within normal limits  URINALYSIS, ROUTINE W REFLEX MICROSCOPIC - Abnormal; Notable for the following components:   Hgb urine dipstick SMALL (*)    All other components within normal limits  LIPASE, BLOOD    EKG None  Radiology No results found.  Procedures Procedures   Medications Ordered in ED Medications  sodium chloride 0.9 % bolus 1,000 mL (has no administration in time range)  ondansetron (ZOFRAN) injection 4 mg (has no administration in time range)  fentaNYL (SUBLIMAZE) injection 50 mcg (has no administration in time range)    ED Course  I have reviewed the triage vital signs and the nursing notes.  Pertinent labs & imaging results that were available during my care of the patient were reviewed by me and considered in my medical decision making (see chart for details).    MDM Rules/Calculators/A&P                          This patient complains of abdominal pain and bloody stools, this involves an extensive number of treatment options, and is a complaint that carries with it a high risk of complications and morbidity.    Differential Dx Diverticulitis, GI bleed, colitis  Pertinent Labs I ordered, reviewed, and interpreted labs, which included CBC notable for hemoglobin of 12.9, this appears stable compared to priors, no significant leukocytosis, no significant electrolyte derangement.  Imaging Interpretation I ordered imaging studies which included CT abdomen/pelvis, which showed no evidence of diverticulitis.   Medications I ordered medication fluids.  Sources  Previous records obtained and reviewed colonoscopy back in 2014, by Dr. Carlean Purl [facility]).   Critical Interventions  None  Reassessments After the interventions stated above, I reevaluated the patient and found with improving blood pressures with fluids. He did have a near syncopal episode while  in the waiting room.  Consultants Family practice  residency to admit patient. Appreciate their help. Secure chat sent to GI, Dr. Benson Norway, covering for [facility] for morning consult.  Plan Admit     Final Clinical Impression(s) / ED Diagnoses Final diagnoses:  Gastrointestinal hemorrhage, unspecified gastrointestinal hemorrhage type    Rx / DC Orders ED Discharge Orders    None       Montine Circle, PA-C 04/10/20 1517    Ripley Fraise, MD 04/10/20 [phone]

## 2020-04-10 NOTE — Hospital Course (Addendum)
Alex [name] is a 65 y.o. male presenting with bright red blood per rectum. PMH is significant for  diverticulosis, T2DM, GERD, hypertension, CAD, HLD, OSA on CPAP, gout, OA, Hx of TIA   Rectal Bleed/Near Syncope Patient had 3 episodes of rectal bleeding starting at 1 PM on 1/29.  He presented to the emergency department and while in the waiting room got up to use the restroom and take his nightly blood pressure medications.  After he left to the restroom he became diaphoretic and hypotensive in the 90s/60s.  CT showed diverticulosis without evidence of diverticulitis. GI saw patient and determined he did not need colonoscopy given known Diverticulosis.  Hgb 12.6>11.4>9.2.  Patient remained asymptomatic and did not have rectal bleed for 24 hours.  Patient very anxious for discharge.  Engaged in shared decision making with patient, and felt was safe to discharge with follow-up for Hgb the next day in the [facility] clinic.   HTN On arrival patient had normotensive blood pressure.  He did have an episode of hypotension which has resolved with IV hydration.  Home medications include Coreg 25 mg twice daily, irbesartan 150 mg daily, amlodipine 10 mg daily.

## 2020-04-10 NOTE — Consult Note (Signed)
Consult Note for [facility]  Reason for Consult: Diverticular bleed Referring Physician: Anemia  Clarnce Flock HPI: This is a 65 year old male with a PMH of a diverticular bleed 10/2018, diverticulitis, DM, CAD, history of MI, and GERD who presents to the ER with hematochezia.  The patient's bleeding started abruptly yesterday and he reports a total of 6 large volume bloody bowel movements.  He experienced an episode of hypotension when he took his blood pressure medication in the ER waiting room.  His HGB on admission was 12.6 g/dL and this AM it was 11.4 g/dL.  He denies any issues with chest pain or SOB.  His colonoscopy 11/2018 was negative for any active bleeding, but he was reconfirmed to have left sided diverticula.  The conclusion was that he had a diverticular bleed.  Past Medical History:  Diagnosis Date  . Asthma   . CAD (coronary artery disease)    Cardiac cath 2002 with 25% distal RCA stenosis.   . DDD (degenerative disc disease), lumbar   . Diabetes mellitus ([place])   . Diverticulitis   . Esophageal stricture   . Gastritis 2010  . GERD (gastroesophageal reflux disease)   . Gout   . HTN (hypertension)   . Hyperlipidemia   . Insomnia   . MI (myocardial infarction) ([place]) 2009   no stent placement  . OA (osteoarthritis)   . Obesity   . OSA on CPAP   . Seasonal allergies   . TIA (transient ischemic attack) 2013    Past Surgical History:  Procedure Laterality Date  . COLONOSCOPY WITH PROPOFOL N/A 11/11/2018   Procedure: COLONOSCOPY WITH PROPOFOL;  Surgeon: Thornton Park, MD;  Location: [facility];  Service: Gastroenterology;  Laterality: N/A;  . ESOPHAGOGASTRODUODENOSCOPY     multiple  . NASAL SINUS SURGERY    . POLYPECTOMY  11/11/2018   Procedure: POLYPECTOMY;  Surgeon: Thornton Park, MD;  Location: [facility] ENDOSCOPY;  Service: Gastroenterology;;  . VASECTOMY      Family History  Problem Relation Age of Onset  . Heart attack Mother   . Heart failure Mother   .  Colon cancer Neg Hx   . Esophageal cancer Neg Hx   . Rectal cancer Neg Hx   . Stomach cancer Neg Hx     Social History:  reports that he quit smoking about 28 years ago. His smoking use included cigarettes. He has a 15.00 pack-year smoking history. He has never used smokeless tobacco. He reports that he does not drink alcohol and does not use drugs.  Allergies:  Allergies  Allergen Reactions  . Atorvastatin Other (See Comments)    Subjective myalgias. Gave 2 trials, developed myalgias which resolved after cessation of medication.  Muscle cramps were 9/10 discomfort  . Codeine Nausea Only  . Shellfish Allergy Other (See Comments)    Activates asthma    Medications:  Scheduled: . allopurinol  100 mg Oral Daily  . amLODipine  10 mg Oral QPC supper  . carvedilol  25 mg Oral Q supper  . irbesartan  150 mg Oral Daily  . ondansetron (ZOFRAN) IV  4 mg Intravenous Once  . pantoprazole  80 mg Oral Daily   Continuous:   Results for orders placed or performed during the hospital encounter of 04/09/20 (from the past 24 hour(s))  Lipase, blood     Status: None   Collection Time: 04/09/20  6:27 PM  Result Value Ref Range   Lipase 27 11 - 51 U/L  Comprehensive  metabolic panel     Status: Abnormal   Collection Time: 04/09/20  6:27 PM  Result Value Ref Range   Sodium 140 135 - 145 mmol/L   Potassium 4.1 3.5 - 5.1 mmol/L   Chloride 105 98 - 111 mmol/L   CO2 23 22 - 32 mmol/L   Glucose, Bld 105 (H) 70 - 99 mg/dL   BUN 18 8 - 23 mg/dL   Creatinine, Ser 1.03 0.61 - 1.24 mg/dL   Calcium 9.1 8.9 - 10.3 mg/dL   Total Protein 7.3 6.5 - 8.1 g/dL   Albumin 3.7 3.5 - 5.0 g/dL   AST 28 15 - 41 U/L   ALT 31 0 - 44 U/L   Alkaline Phosphatase 71 38 - 126 U/L   Total Bilirubin 0.7 0.3 - 1.2 mg/dL   GFR, Estimated >60 >60 mL/min   Anion gap 12 5 - 15  CBC     Status: Abnormal   Collection Time: 04/09/20  6:27 PM  Result Value Ref Range   WBC 9.4 4.0 - 10.5 K/uL   RBC 4.70 4.22 - 5.81 MIL/uL    Hemoglobin 12.6 (L) 13.0 - 17.0 g/dL   HCT 39.2 39.0 - 52.0 %   MCV 83.4 80.0 - 100.0 fL   MCH 26.8 26.0 - 34.0 pg   MCHC 32.1 30.0 - 36.0 g/dL   RDW 15.2 11.5 - 15.5 %   Platelets 306 150 - 400 K/uL   nRBC 0.0 0.0 - 0.2 %  Urinalysis, Routine w reflex microscopic Urine, Clean Catch     Status: Abnormal   Collection Time: 04/09/20  9:55 PM  Result Value Ref Range   Color, Urine YELLOW YELLOW   APPearance CLEAR CLEAR   Specific Gravity, Urine 1.021 1.005 - 1.030   pH 5.0 5.0 - 8.0   Glucose, UA NEGATIVE NEGATIVE mg/dL   Hgb urine dipstick SMALL (A) NEGATIVE   Bilirubin Urine NEGATIVE NEGATIVE   Ketones, ur NEGATIVE NEGATIVE mg/dL   Protein, ur NEGATIVE NEGATIVE mg/dL   Nitrite NEGATIVE NEGATIVE   Leukocytes,Ua NEGATIVE NEGATIVE   RBC / HPF 0-5 0 - 5 RBC/hpf   WBC, UA 0-5 0 - 5 WBC/hpf   Bacteria, UA NONE SEEN NONE SEEN   Mucus PRESENT   SARS CORONAVIRUS 2 (TAT 6-24 HRS) Nasopharyngeal Nasopharyngeal Swab     Status: None   Collection Time: 04/10/20  1:59 AM   Specimen: Nasopharyngeal Swab  Result Value Ref Range   SARS Coronavirus 2 NEGATIVE NEGATIVE  POC occult blood, ED     Status: Abnormal   Collection Time: 04/10/20  2:20 AM  Result Value Ref Range   Fecal Occult Bld POSITIVE (A) NEGATIVE  Type and screen     Status: None   Collection Time: 04/10/20  2:36 AM  Result Value Ref Range   ABO/RH(D) A POS    Antibody Screen NEG    Sample Expiration      04/13/2020,2359 Performed at [facility], [address], [place], [place] [id number]   Basic metabolic panel     Status: Abnormal   Collection Time: 04/10/20  4:40 AM  Result Value Ref Range   Sodium 138 135 - 145 mmol/L   Potassium 4.2 3.5 - 5.1 mmol/L   Chloride 107 98 - 111 mmol/L   CO2 21 (L) 22 - 32 mmol/L   Glucose, Bld 138 (H) 70 - 99 mg/dL   BUN 19 8 - 23 mg/dL   Creatinine, Ser 1.05  0.61 - 1.24 mg/dL   Calcium 8.6 (L) 8.9 - 10.3 mg/dL   GFR, Estimated >60 >60 mL/min   Anion gap 10 5 - 15  CBC      Status: Abnormal   Collection Time: 04/10/20  4:40 AM  Result Value Ref Range   WBC 10.9 (H) 4.0 - 10.5 K/uL   RBC 4.34 4.22 - 5.81 MIL/uL   Hemoglobin 11.4 (L) 13.0 - 17.0 g/dL   HCT 36.9 (L) 39.0 - 52.0 %   MCV 85.0 80.0 - 100.0 fL   MCH 26.3 26.0 - 34.0 pg   MCHC 30.9 30.0 - 36.0 g/dL   RDW 15.2 11.5 - 15.5 %   Platelets 276 150 - 400 K/uL   nRBC 0.0 0.0 - 0.2 %     CT ABDOMEN PELVIS W CONTRAST  Result Date: 04/10/2020 CLINICAL DATA:  Diverticulitis suspected. Abdominal pain since 1 a.m. EXAM: CT ABDOMEN AND PELVIS WITH CONTRAST TECHNIQUE: Multidetector CT imaging of the abdomen and pelvis was performed using the standard protocol following bolus administration of intravenous contrast. CONTRAST:  150mL OMNIPAQUE IOHEXOL 300 MG/ML  SOLN COMPARISON:  CT abdomen pelvis 05/27/2019 FINDINGS: Lower chest: Subsegmental atelectasis of bilateral lower lobes. Hepatobiliary: Subcentimeter hypodensity within left hepatic lobe is too small to characterize. Otherwise no focal liver abnormality. Calcified gallstones within the gallbladder lumen. No gallbladder wall thickening or pericholecystic fluid. No biliary dilatation. Pancreas: No focal lesion. Normal pancreatic contour. No surrounding inflammatory changes. No main pancreatic ductal dilatation. Spleen: Normal in size without focal abnormality. Adrenals/Urinary Tract: No adrenal nodule bilaterally. Bilateral kidneys enhance symmetrically. There is a stable 4.8 cm hypodense lesion within the inferior pole of the left kidney that demonstrates a density of 22 Hounsfield units. Several other similar in size hypodensities demonstrate lesions demonstrate fluid density and likely represent simple renal cysts. No hydronephrosis. No hydroureter. The urinary bladder is unremarkable. Stomach/Bowel: Stomach is within normal limits. No evidence of bowel wall thickening or dilatation. Sigmoid diverticulosis. Appendix appears normal. Vascular/Lymphatic: No abdominal  aorta or iliac aneurysm. Mild atherosclerotic plaque. No abdominal, pelvic, or inguinal lymphadenopathy. Reproductive: Prostate is unremarkable. Other: No intraperitoneal free fluid. No intraperitoneal free gas. No organized fluid collection. Musculoskeletal: No abdominal wall hernia or abnormality No suspicious lytic or blastic osseous lesions. No acute displaced fracture. Multilevel mild degenerative changes of the spine. IMPRESSION: 1. Sigmoid diverticulosis with no definite findings to suggest acute diverticulitis. 2. Cholelithiasis with no acute cholecystitis. 3. Indeterminate stable in appearance 4.8 cm lesion within the left kidney could represent a complex cystic lesion versus solid masses. Consider MRI renal protocol for further evaluation. Electronically Signed   By: Iven Finn M.D.   On: 04/10/2020 01:13    ROS:  As stated above in the HPI otherwise negative.  Blood pressure 128/83, pulse 60, temperature 97.7 F (36.5 C), temperature source Oral, resp. rate 17, height 5\' 7"  (1.702 m), weight (!) 147.4 kg, SpO2 95 %.    PE: Gen: NAD, Alert and Oriented HEENT:  [place]/AT, EOMI Neck: Supple, no LAD Lungs: CTA Bilaterally CV: RRR without M/G/R ABD: Soft, NTND, +BS Ext: No C/C/E  Assessment/Plan: 1) Diverticular bleed. 2) Anemia. 3) HTN.   The patient's presentation is consistent with a recurrent diverticular bleed.  He feels well at this time and he is hemodynamically stable.  His HGB is relatively stable.  Plan: 1) Monitor HGB. 2) Transfuse as necessary. 3) [facility] to assume care in the AM.  Mckinnley Cottier D 04/10/2020, 10:31 AM

## 2020-04-11 DIAGNOSIS — K5733 Diverticulitis of large intestine without perforation or abscess with bleeding: Secondary | ICD-10-CM | POA: Diagnosis not present

## 2020-04-11 DIAGNOSIS — K922 Gastrointestinal hemorrhage, unspecified: Secondary | ICD-10-CM | POA: Diagnosis not present

## 2020-04-11 DIAGNOSIS — R55 Syncope and collapse: Secondary | ICD-10-CM | POA: Diagnosis not present

## 2020-04-11 DIAGNOSIS — K5731 Diverticulosis of large intestine without perforation or abscess with bleeding: Secondary | ICD-10-CM | POA: Diagnosis not present

## 2020-04-11 DIAGNOSIS — K921 Melena: Secondary | ICD-10-CM | POA: Diagnosis not present

## 2020-04-11 DIAGNOSIS — K5790 Diverticulosis of intestine, part unspecified, without perforation or abscess without bleeding: Secondary | ICD-10-CM | POA: Diagnosis not present

## 2020-04-11 LAB — CBC
HCT: 29.6 % — ABNORMAL LOW (ref 39.0–52.0)
Hemoglobin: 9.2 g/dL — ABNORMAL LOW (ref 13.0–17.0)
MCH: 26.6 pg (ref 26.0–34.0)
MCHC: 31.1 g/dL (ref 30.0–36.0)
MCV: 85.5 fL (ref 80.0–100.0)
Platelets: 252 10*3/uL (ref 150–400)
RBC: 3.46 MIL/uL — ABNORMAL LOW (ref 4.22–5.81)
RDW: 15.4 % (ref 11.5–15.5)
WBC: 9.6 10*3/uL (ref 4.0–10.5)
nRBC: 0 % (ref 0.0–0.2)

## 2020-04-11 LAB — BASIC METABOLIC PANEL
Anion gap: 10 (ref 5–15)
BUN: 24 mg/dL — ABNORMAL HIGH (ref 8–23)
CO2: 22 mmol/L (ref 22–32)
Calcium: 8.9 mg/dL (ref 8.9–10.3)
Chloride: 105 mmol/L (ref 98–111)
Creatinine, Ser: 1.24 mg/dL (ref 0.61–1.24)
GFR, Estimated: >60 mL/min (ref >60)
Glucose, Bld: 128 mg/dL — ABNORMAL HIGH (ref 70–99)
Potassium: 4.2 mmol/L (ref 3.5–5.1)
Sodium: 137 mmol/L (ref 135–145)

## 2020-04-11 NOTE — Discharge Instructions (Signed)
Dear Alex [name],   Thank you for letting us participate in your care! In this section, you will find a brief hospital admission summary of why you were admitted to the hospital, what happened during your admission, your diagnosis/diagnoses, and recommended follow up.   You were admitted because you were experiencing lighteadedness and blood in bowel movement.   Your testing revealed low Hemoglobin.   You were diagnosed with this being caused by Diverticulitis  You were also seen by GI. They recommended continuing to monitor your hemoglobin and hold off on taking Aspirin for 1 week.   Your bleeding with bowel movements stopped and you were discharged from the hospital for meeting this goal.    POST-HOSPITAL & CARE INSTRUCTIONS 1. Please follow-up with your PCP tomorrow at 2:30 PM to have your Hemoglobin checked.  It is very improtant yopu go to this appointment. 2. We want you to hold off on taking your amlodipine and Aspirin at this time.  Recommend discussing this at outpatient visit and restrting Aspirin on February 6th 2022. 3. Please let PCP/Specialists know of any changes in medications that were made.  4. Please see medications section of this packet for any medication changes.   DOCTOR'S APPOINTMENTS & FOLLOW UP Future Appointments  Date Time Provider [place]  04/12/2020  2:30 PM ACCESS TO CARE POOL [id number] [place]     Thank you for choosing [facility]! Take care and be well!  [facility]  [place], [place] [address] [phone]

## 2020-04-11 NOTE — Progress Notes (Signed)
Daily Rounding Note  04/11/2020, 11:15 AM  LOS: 0 days   SUBJECTIVE:   Chief complaint: Painless hematochezia.    Last episode of hematochezia was about 3 PM yesterday.  The next bowel movement was this morning at which time he passed a dark stool without obvious blood.  He is eaten several solid meals.  He feels great.  No weakness, no dizziness, no shortness of breath, no chest pressure  OBJECTIVE:         Vital signs in last 24 hours:    Temp:  [98 F (36.7 C)-98.4 F (36.9 C)] 98.4 F (36.9 C) (01/31 0518) Pulse Rate:  [69-96] 69 (01/31 0518) Resp:  [12-20] 16 (01/31 0518) BP: (100-128)/(79-90) 127/85 (01/31 0518) SpO2:  [94 %-99 %] 98 % (01/31 0518) Weight:  [145.6 kg] 145.6 kg (01/30 1953) Last BM Date: 04/08/20 Filed Weights   04/09/20 1732 04/10/20 1953  Weight: (!) 147.4 kg (!) 145.6 kg   General: Looks well.  Obese. Heart: RRR.  No MRG.  S1, S2 present Chest: Clear bilaterally.  No labored breathing.  No cough Abdomen: Obese, soft, nontender, active bowel sounds. Extremities: No CCE. Neuro/Psych: Pleasant, calm.  Fully alert and oriented.  No gross deficits.  No tremors  Intake/Output from previous day: 01/30 0701 - 01/31 0700 In: 240 [P.O.:240] Out: -   Intake/Output this shift: No intake/output data recorded.  Lab Results: Recent Labs    04/09/20 1827 04/10/20 0440 04/11/20 0152  WBC 9.4 10.9* 9.6  HGB 12.6* 11.4* 9.2*  HCT 39.2 36.9* 29.6*  PLT 306 276 252   BMET Recent Labs    04/09/20 1827 04/10/20 0440 04/11/20 0152  NA 140 138 137  K 4.1 4.2 4.2  CL 105 107 105  CO2 23 21* 22  GLUCOSE 105* 138* 128*  BUN 18 19 24*  CREATININE 1.03 1.05 1.24  CALCIUM 9.1 8.6* 8.9   LFT Recent Labs    04/09/20 1827  PROT 7.3  ALBUMIN 3.7  AST 28  ALT 31  ALKPHOS 71  BILITOT 0.7   PT/INR No results for input(s): LABPROT, INR in the last 72 hours. Hepatitis Panel No results for  input(s): HEPBSAG, HCVAB, HEPAIGM, HEPBIGM in the last 72 hours.  Studies/Results: CT ABDOMEN PELVIS W CONTRAST  Result Date: 04/10/2020 CLINICAL DATA:  Diverticulitis suspected. Abdominal pain since 1 a.m. EXAM: CT ABDOMEN AND PELVIS WITH CONTRAST TECHNIQUE: Multidetector CT imaging of the abdomen and pelvis was performed using the standard protocol following bolus administration of intravenous contrast. CONTRAST:  140mL OMNIPAQUE IOHEXOL 300 MG/ML  SOLN COMPARISON:  CT abdomen pelvis 05/27/2019 FINDINGS: Lower chest: Subsegmental atelectasis of bilateral lower lobes. Hepatobiliary: Subcentimeter hypodensity within left hepatic lobe is too small to characterize. Otherwise no focal liver abnormality. Calcified gallstones within the gallbladder lumen. No gallbladder wall thickening or pericholecystic fluid. No biliary dilatation. Pancreas: No focal lesion. Normal pancreatic contour. No surrounding inflammatory changes. No main pancreatic ductal dilatation. Spleen: Normal in size without focal abnormality. Adrenals/Urinary Tract: No adrenal nodule bilaterally. Bilateral kidneys enhance symmetrically. There is a stable 4.8 cm hypodense lesion within the inferior pole of the left kidney that demonstrates a density of 22 Hounsfield units. Several other similar in size hypodensities demonstrate lesions demonstrate fluid density and likely represent simple renal cysts. No hydronephrosis. No hydroureter. The urinary bladder is unremarkable. Stomach/Bowel: Stomach is within normal limits. No evidence of bowel wall thickening or dilatation. Sigmoid diverticulosis. Appendix appears normal.  Vascular/Lymphatic: No abdominal aorta or iliac aneurysm. Mild atherosclerotic plaque. No abdominal, pelvic, or inguinal lymphadenopathy. Reproductive: Prostate is unremarkable. Other: No intraperitoneal free fluid. No intraperitoneal free gas. No organized fluid collection. Musculoskeletal: No abdominal wall hernia or abnormality No  suspicious lytic or blastic osseous lesions. No acute displaced fracture. Multilevel mild degenerative changes of the spine. IMPRESSION: 1. Sigmoid diverticulosis with no definite findings to suggest acute diverticulitis. 2. Cholelithiasis with no acute cholecystitis. 3. Indeterminate stable in appearance 4.8 cm lesion within the left kidney could represent a complex cystic lesion versus solid masses. Consider MRI renal protocol for further evaluation. Electronically Signed   By: Iven Finn M.D.   On: 04/10/2020 01:13    ASSESMENT:   *     Painless, large-volume hematochezia in patient with previous diverticular bleed 10/2018. 11/2018 colonoscopy with left-sided diverticulosis, no active bleeding, colon 2 small polyps removed (TAs w/o HGD), and bleeding internal/external hemorrhoids. Hemodynamically stable.  *    Blood loss anemia.  Hgb 12.6 >> 9.2.  *     Uncomplicated cholelithiasis seen on CT.  *   Indeterminate, stable, cystic lesion in the left kidney.  GFR normal.   PLAN   *   From GI standpoint patient is okay for discharge. Would have his PCP check his CBC within the next 10 to 14 days to make sure it is trending in an upward direction. Discussed the nature of diverticular bleeding with the patient and the fact that it can recur at any time and there is no predictor as to what triggers the bleeding. Advised the patient to avoid heavy lifting Normally takes low-dose aspirin daily, advised this to be restarted in 1 week. fup prn w Dr Carlean Purl.      Alex [name]  04/11/2020, 11:15 AM Phone [phone]

## 2020-04-11 NOTE — Discharge Summary (Signed)
[facility] Discharge Summary  Patient name: Alex [name] Medical record number: [id number] Date of birth: Jan 14, 1956 Age: 65 y.o. Gender: male Date of Admission: 04/09/2020  Date of Discharge: 04/11/2020 Admitting Physician: Gifford Shave, MD  Primary Care Provider: Matilde Haymaker, MD Consultants: GI  Indication for Hospitalization: BRBPR  Discharge Diagnoses/Problem List: Diverticulosis, T2DM, GERD, hypertension, CAD, HLD, OSA on CPAP, gout, OA, Hx of TIA   Disposition: Stable  Discharge Condition: Able to be discharged home safely  Discharge Exam:   Physical Exam Constitutional:      General: He is not in acute distress.    Appearance: He is not ill-appearing.  HENT:     Head: Normocephalic and atraumatic.  Cardiovascular:     Rate and Rhythm: Normal rate and regular rhythm.     Heart sounds: Normal heart sounds.  Pulmonary:     Effort: Pulmonary effort is normal.     Breath sounds: Normal breath sounds.  Abdominal:     General: Abdomen is flat and scaphoid. Bowel sounds are normal.     Palpations: Abdomen is soft.     Tenderness: There is no abdominal tenderness.  Skin:    General: Skin is warm.  Neurological:     General: No focal deficit present.     Mental Status: He is alert.     Brief Hospital Course:  Alex [name] is a 65 y.o. male presenting with bright red blood per rectum. PMH is significant for  diverticulosis, T2DM, GERD, hypertension, CAD, HLD, OSA on CPAP, gout, OA, Hx of TIA   Rectal Bleed/Near Syncope Patient had 3 episodes of rectal bleeding starting at 1 PM on 1/29.  He presented to the emergency department and while in the waiting room got up to use the restroom and take his nightly blood pressure medications.  After he left to the restroom he became diaphoretic and hypotensive in the 90s/60s.  CT showed diverticulosis without evidence of diverticulitis. GI saw patient and determined he did not need colonoscopy  given known Diverticulosis.  Hgb 12.6>11.4>9.2.  Patient remained asymptomatic and did not have rectal bleed for 24 hours.  Patient very anxious for discharge.  Engaged in shared decision making with patient, and felt was safe to discharge with follow-up for Hgb the next day in the [facility] clinic.   HTN On arrival patient had normotensive blood pressure.  He did have an episode of hypotension which has resolved with IV hydration.  Home medications include Coreg 25 mg twice daily, irbesartan 150 mg daily, amlodipine 10 mg daily.     Issues for Follow Up:  1. Hgb check on 04/12/20, and 2 weeks folowing. 2. Restart Aspirin on 04/17/20 per GI recs. 3. Held Amlodipine at D/C.  F/u BP's and consider restarting if becomes hypertensive again. 4.   Left renal mass - Consider MRI renal protocol for further evaluation. 5.   Consider PCSK1 inhibitor given history of myalgia due to statin.  Significant Procedures: None  Significant Labs and Imaging:  Recent Labs  Lab 04/09/20 1827 04/10/20 0440 04/11/20 0152  WBC 9.4 10.9* 9.6  HGB 12.6* 11.4* 9.2*  HCT 39.2 36.9* 29.6*  PLT 306 276 252   Recent Labs  Lab 04/09/20 1827 04/10/20 0440 04/11/20 0152  NA 140 138 137  K 4.1 4.2 4.2  CL 105 107 105  CO2 23 21* 22  GLUCOSE 105* 138* 128*  BUN 18 19 24*  CREATININE 1.03 1.05 1.24  CALCIUM 9.1 8.6* 8.9  ALKPHOS 71  --   --   AST 28  --   --   ALT 31  --   --   ALBUMIN 3.7  --   --      Results/Tests Pending at Time of Discharge: None  Discharge Medications:  Allergies as of 04/11/2020      Reactions   Atorvastatin Other (See Comments)   Subjective myalgias. Gave 2 trials, developed myalgias which resolved after cessation of medication.  Muscle cramps were 9/10 discomfort   Codeine Nausea Only   Shellfish Allergy Other (See Comments)   Activates asthma      Medication List    STOP taking these medications   amLODipine 10 MG tablet Commonly known as: NORVASC   aspirin EC 81 MG  tablet     TAKE these medications   Accu-Chek Aviva Connect w/Device Kit 1 Device by Does not apply route daily.   acetaminophen 500 MG tablet Commonly known as: TYLENOL Take 1 tablet (500 mg total) by mouth every 6 (six) hours as needed for headache (pain).   allopurinol 100 MG tablet Commonly known as: ZYLOPRIM Take 1 tablet (100 mg total) by mouth daily.   carvedilol 25 MG tablet Commonly known as: COREG Take 1 tablet (25 mg total) by mouth daily with supper.   colchicine 0.6 MG tablet Take 1 tablet (0.6 mg total) by mouth daily.   freestyle lancets Use as instructed   Accu-Chek FastClix Lancets Misc 1 Device by Does not apply route daily.   glucose blood test strip 1 each by Other route daily. Use as instructed   irbesartan 300 MG tablet Commonly known as: AVAPRO Take 0.5 tablets (150 mg total) by mouth daily.   Iron 325 (65 Fe) MG Tabs Take 1 tablet (325 mg total) by mouth every morning.   naproxen 500 MG tablet Commonly known as: NAPROSYN TAKE 1 TABLET BY MOUTH ONCE DAILY AS NEEDED FOR MODERATE PAIN What changed: See the new instructions.   omeprazole 40 MG capsule Commonly known as: PRILOSEC TAKE 1 CAPSULE BY MOUTH ONCE DAILY WITH SUPPER. What changed: See the new instructions.   triamcinolone ointment 0.5 % Commonly known as: KENALOG Apply to affected area. What changed:   how much to take  how to take this  when to take this  reasons to take this  additional instructions       Discharge Instructions: Please refer to Patient Instructions section of EMR for full details.  Patient was counseled important signs and symptoms that should prompt return to medical care, changes in medications, dietary instructions, activity restrictions, and follow up appointments.   Follow-Up Appointments:   [facility]- 04/12/20 2:30 PM  Delora Fuel, MD 04/11/2020, 11:47 PM PGY-1, [place]

## 2020-04-11 NOTE — Progress Notes (Signed)
RN gave pt discharge instructions and he stated understanding. IV has been removed and the patient is dressed and belongings have been packed

## 2020-04-12 ENCOUNTER — Other Ambulatory Visit: Payer: PPO

## 2020-04-12 ENCOUNTER — Other Ambulatory Visit: Payer: Self-pay

## 2020-04-12 ENCOUNTER — Telehealth (INDEPENDENT_AMBULATORY_CARE_PROVIDER_SITE_OTHER): Payer: PPO | Admitting: Family Medicine

## 2020-04-12 DIAGNOSIS — E1159 Type 2 diabetes mellitus with other circulatory complications: Secondary | ICD-10-CM

## 2020-04-12 DIAGNOSIS — N281 Cyst of kidney, acquired: Secondary | ICD-10-CM | POA: Insufficient documentation

## 2020-04-12 DIAGNOSIS — D6489 Other specified anemias: Secondary | ICD-10-CM | POA: Diagnosis not present

## 2020-04-12 DIAGNOSIS — Z8719 Personal history of other diseases of the digestive system: Secondary | ICD-10-CM

## 2020-04-12 DIAGNOSIS — K5792 Diverticulitis of intestine, part unspecified, without perforation or abscess without bleeding: Secondary | ICD-10-CM

## 2020-04-12 DIAGNOSIS — R42 Dizziness and giddiness: Secondary | ICD-10-CM | POA: Diagnosis not present

## 2020-04-12 DIAGNOSIS — R55 Syncope and collapse: Secondary | ICD-10-CM

## 2020-04-12 DIAGNOSIS — I152 Hypertension secondary to endocrine disorders: Secondary | ICD-10-CM

## 2020-04-12 DIAGNOSIS — I2510 Atherosclerotic heart disease of native coronary artery without angina pectoris: Secondary | ICD-10-CM

## 2020-04-12 NOTE — Assessment & Plan Note (Signed)
Patient has bilateral renal cysts noted on CT a/p: R ~2.5cm, L ~4.6 cm. Most recent CT a/p with recommendation for contrast study for better work up, unclear diagnosis: "There is a stable 4.8 cm hypodense lesion within the inferior pole of the left kidney that demonstrates a density of 22 Hounsfield units. Several other similar in size hypodensities demonstrate lesions demonstrate fluid density and likely represent simple renal cysts"  Will follow up this recommendation when acute problem of GI bleed resolves.

## 2020-04-12 NOTE — Progress Notes (Signed)
[facility] Telemedicine Visit  Patient consented to have virtual visit and was identified by name and date of birth. Method of visit: Video  Encounter participants: Patient: MD SMOLA - located at home Provider: Gladys Damme - located at home  Chief Complaint: Diverticular bleeding  HPI:   65 yo male patient presenting for follow today via telemedicine for recent hospitalization. Patient was admitted to [facility] service on 1/29-1/31 for BRBPR. He had a CT a/p that showed diverticulosis, he had had a colonoscopy in 2020 and GI service recommended no repeat scope. Hgb dropped from 11.2 to 9.4 day of discharge. Patient reports that since discharge he has had continued BRBPR about every 3 hours, passing clots and liquid blood. He has not felt dizzy or had any falls. Patient has automatic BP cuff and took his BP during video visit which was 149/90. Home meds include Coreg 25 mg twice daily, irbesartan 150 mg daily, amlodipine 10 mg daily. He recently was taken off amlodipine due to sx hypotension in setting of diverticular bleed. Patient reports at last visit with PCP he had been recommended to increase irbesartan to 300mg  qd, but he did not because he felt too sleepy, continues on 150mg  qd.   ROS: per HPI  Pertinent PMHx: HTN, renal cysts, diverticulosis  Exam:  There were no vitals taken for this visit.  Respiratory: speaking in full sentences  Assessment/Plan:  Anemia Due to diverticular bleed. Patient reports significant BRBPR continuing q3h at home since discharge from hospital yesterday. Reassuringly, BP is appropriate, mildly elevated (perhaps due to anemia) and he is currently asymptomatic. Due to continued significant blood loss, recommend he go to clinic for poc Hgb and stat CBC/BMP (confirmation). Last hgb yesterday (1/31) 9.4, dropped from 11.4 the day prior (1/30). If hgb <7 today, recommend he go to ED for blood transfusion/further work up from GI as  he may need intervention to stop diverticular bleed. If >7, will continue monitoring. F/u appt made for Monday, 2/7 at [facility].  Hypertension associated with diabetes ([place]) Due to recent GI bleed and hospitalization, patient had amlodipine held. Currently still using irbesartan 150 mg (reported that 300 mg daily made him too sleepy), and coreg 25 mg BID. BP today 149/90, which is appropriate in setting of active bleed. Recommend no medication changes at this time, follow up Monday, 2/7. Recommend hypertensive meds titrated when patient is at baseline health.  Bilateral renal cysts Patient has bilateral renal cysts noted on CT a/p: R ~2.5cm, L ~4.6 cm. Most recent CT a/p with recommendation for contrast study for better work up, unclear diagnosis: "There is a stable 4.8 cm hypodense lesion within the inferior pole of the left kidney that demonstrates a density of 22 Hounsfield units. Several other similar in size hypodensities demonstrate lesions demonstrate fluid density and likely represent simple renal cysts"  Will follow up this recommendation when acute problem of GI bleed resolves.  Coronary artery disease Patient cannot tolerate statins, D/C summary recommended considering PCSK9 inhibitor. Recommend considering this after acute GI bleed is resolved.    Time spent during visit with patient: 17 minutes

## 2020-04-12 NOTE — Assessment & Plan Note (Signed)
Patient cannot tolerate statins, D/C summary recommended considering PCSK9 inhibitor. Recommend considering this after acute GI bleed is resolved.

## 2020-04-12 NOTE — Assessment & Plan Note (Signed)
Due to diverticular bleed. Patient reports significant BRBPR continuing q3h at home since discharge from hospital yesterday. Reassuringly, BP is appropriate, mildly elevated (perhaps due to anemia) and he is currently asymptomatic. Due to continued significant blood loss, recommend he go to clinic for poc Hgb and stat CBC/BMP (confirmation). Last hgb yesterday (1/31) 9.4, dropped from 11.4 the day prior (1/30). If hgb <7 today, recommend he go to ED for blood transfusion/further work up from GI as he may need intervention to stop diverticular bleed. If >7, will continue monitoring. F/u appt made for Monday, 2/7 at [facility].

## 2020-04-12 NOTE — Assessment & Plan Note (Signed)
Due to recent GI bleed and hospitalization, patient had amlodipine held. Currently still using irbesartan 150 mg (reported that 300 mg daily made him too sleepy), and coreg 25 mg BID. BP today 149/90, which is appropriate in setting of active bleed. Recommend no medication changes at this time, follow up Monday, 2/7. Recommend hypertensive meds titrated when patient is at baseline health.

## 2020-04-13 ENCOUNTER — Telehealth: Payer: Self-pay | Admitting: Family Medicine

## 2020-04-13 LAB — CBC WITH DIFFERENTIAL/PLATELET
Basophils Absolute: 0.1 10*3/uL (ref 0.0–0.2)
Basos: 1 %
EOS (ABSOLUTE): 0.4 10*3/uL (ref 0.0–0.4)
Eos: 3 %
Hematocrit: 25.7 % — ABNORMAL LOW (ref 37.5–51.0)
Hemoglobin: 8.4 g/dL — ABNORMAL LOW (ref 13.0–17.7)
Immature Grans (Abs): 0.1 10*3/uL (ref 0.0–0.1)
Immature Granulocytes: 1 %
Lymphocytes Absolute: 4 10*3/uL — ABNORMAL HIGH (ref 0.7–3.1)
Lymphs: 37 %
MCH: 26.8 pg (ref 26.6–33.0)
MCHC: 32.7 g/dL (ref 31.5–35.7)
MCV: 82 fL (ref 79–97)
Monocytes Absolute: 0.8 10*3/uL (ref 0.1–0.9)
Monocytes: 7 %
Neutrophils Absolute: 5.6 10*3/uL (ref 1.4–7.0)
Neutrophils: 51 %
Platelets: 272 10*3/uL (ref 150–450)
RBC: 3.13 x10E6/uL — ABNORMAL LOW (ref 4.14–5.80)
RDW: 14.5 % (ref 11.6–15.4)
WBC: 10.8 10*3/uL (ref 3.4–10.8)

## 2020-04-13 LAB — BASIC METABOLIC PANEL
BUN/Creatinine Ratio: 14 (ref 10–24)
BUN: 17 mg/dL (ref 8–27)
CO2: 21 mmol/L (ref 20–29)
Calcium: 8.8 mg/dL (ref 8.6–10.2)
Chloride: 104 mmol/L (ref 96–106)
Creatinine, Ser: 1.2 mg/dL (ref 0.76–1.27)
GFR calc Af Amer: 73 mL/min/{1.73_m2} (ref >59)
GFR calc non Af Amer: 64 mL/min/{1.73_m2} (ref >59)
Glucose: 115 mg/dL — ABNORMAL HIGH (ref 65–99)
Potassium: 4.2 mmol/L (ref 3.5–5.2)
Sodium: 140 mmol/L (ref 134–144)

## 2020-04-13 NOTE — Telephone Encounter (Signed)
65 yo patient with diverticular GI bleed recently discharged from [facility] service on 1/31 was seen yesterday by me on virtual visit for continued GI bleed. Hgb yesterday was at 8.4, a decrease from 9.4 the day prior. Patient's transfusion threshold is 8 due to h/o CAD. I called and discussed results with patient today. He reports that the GI bleed has not completely stopped, but has slowed down. On 2/1 he reported blood clots every 3 hours, he has only had 3 total episodes today. He denies dizziness, endorses fatigue. Scheduled patient for clinic appt tomorrow with Dr. Oleh Genin on 2/3 at 9:50 AM. Plan is to check poc hgb/stat cbc tomorrow to ensure he has not dropped below transfusion threshold of 8 g/dL. If patient has chest pain or symptoms like dizziness/fainting, he should go to ED immediately. He expressed understanding. Precepted with Dr. Ardelia Mems.  Gladys Damme, MD [facility], PGY-2

## 2020-04-14 ENCOUNTER — Observation Stay (HOSPITAL_COMMUNITY): Payer: PPO

## 2020-04-14 ENCOUNTER — Encounter: Payer: Self-pay | Admitting: Family Medicine

## 2020-04-14 ENCOUNTER — Ambulatory Visit (INDEPENDENT_AMBULATORY_CARE_PROVIDER_SITE_OTHER): Payer: PPO | Admitting: Family Medicine

## 2020-04-14 ENCOUNTER — Other Ambulatory Visit: Payer: Self-pay

## 2020-04-14 ENCOUNTER — Inpatient Hospital Stay (HOSPITAL_COMMUNITY)
Admission: EM | Admit: 2020-04-14 | Discharge: 2020-04-17 | DRG: 811 | Disposition: A | Discharge status: Home / Self Care | Payer: PPO | Source: Ambulatory Visit | Attending: Family Medicine | Admitting: Family Medicine

## 2020-04-14 ENCOUNTER — Encounter (HOSPITAL_COMMUNITY): Payer: Self-pay | Admitting: Emergency Medicine

## 2020-04-14 VITALS — BP 132/72 | HR 91 | Wt 326.8 lb

## 2020-04-14 DIAGNOSIS — M109 Gout, unspecified: Secondary | ICD-10-CM | POA: Diagnosis not present

## 2020-04-14 DIAGNOSIS — Z8673 Personal history of transient ischemic attack (TIA), and cerebral infarction without residual deficits: Secondary | ICD-10-CM

## 2020-04-14 DIAGNOSIS — K922 Gastrointestinal hemorrhage, unspecified: Secondary | ICD-10-CM | POA: Diagnosis not present

## 2020-04-14 DIAGNOSIS — E669 Obesity, unspecified: Secondary | ICD-10-CM | POA: Diagnosis present

## 2020-04-14 DIAGNOSIS — I2510 Atherosclerotic heart disease of native coronary artery without angina pectoris: Secondary | ICD-10-CM | POA: Diagnosis present

## 2020-04-14 DIAGNOSIS — E1169 Type 2 diabetes mellitus with other specified complication: Secondary | ICD-10-CM | POA: Diagnosis present

## 2020-04-14 DIAGNOSIS — D62 Acute posthemorrhagic anemia: Secondary | ICD-10-CM | POA: Diagnosis not present

## 2020-04-14 DIAGNOSIS — I152 Hypertension secondary to endocrine disorders: Secondary | ICD-10-CM | POA: Diagnosis present

## 2020-04-14 DIAGNOSIS — K5731 Diverticulosis of large intestine without perforation or abscess with bleeding: Secondary | ICD-10-CM | POA: Diagnosis present

## 2020-04-14 DIAGNOSIS — I248 Other forms of acute ischemic heart disease: Secondary | ICD-10-CM | POA: Diagnosis present

## 2020-04-14 DIAGNOSIS — Z9852 Vasectomy status: Secondary | ICD-10-CM

## 2020-04-14 DIAGNOSIS — M5136 Other intervertebral disc degeneration, lumbar region: Secondary | ICD-10-CM | POA: Diagnosis present

## 2020-04-14 DIAGNOSIS — D649 Anemia, unspecified: Secondary | ICD-10-CM

## 2020-04-14 DIAGNOSIS — Z87891 Personal history of nicotine dependence: Secondary | ICD-10-CM

## 2020-04-14 DIAGNOSIS — Z91013 Allergy to seafood: Secondary | ICD-10-CM

## 2020-04-14 DIAGNOSIS — K921 Melena: Secondary | ICD-10-CM

## 2020-04-14 DIAGNOSIS — Z888 Allergy status to other drugs, medicaments and biological substances status: Secondary | ICD-10-CM

## 2020-04-14 DIAGNOSIS — Z6841 Body Mass Index (BMI) 40.0 and over, adult: Secondary | ICD-10-CM

## 2020-04-14 DIAGNOSIS — K219 Gastro-esophageal reflux disease without esophagitis: Secondary | ICD-10-CM | POA: Diagnosis present

## 2020-04-14 DIAGNOSIS — G4733 Obstructive sleep apnea (adult) (pediatric): Secondary | ICD-10-CM | POA: Diagnosis present

## 2020-04-14 DIAGNOSIS — J45909 Unspecified asthma, uncomplicated: Secondary | ICD-10-CM | POA: Diagnosis present

## 2020-04-14 DIAGNOSIS — Z79899 Other long term (current) drug therapy: Secondary | ICD-10-CM

## 2020-04-14 DIAGNOSIS — E1151 Type 2 diabetes mellitus with diabetic peripheral angiopathy without gangrene: Secondary | ICD-10-CM | POA: Diagnosis present

## 2020-04-14 DIAGNOSIS — Z885 Allergy status to narcotic agent status: Secondary | ICD-10-CM

## 2020-04-14 DIAGNOSIS — Z8249 Family history of ischemic heart disease and other diseases of the circulatory system: Secondary | ICD-10-CM

## 2020-04-14 DIAGNOSIS — I110 Hypertensive heart disease with heart failure: Secondary | ICD-10-CM | POA: Diagnosis present

## 2020-04-14 DIAGNOSIS — Z20822 Contact with and (suspected) exposure to covid-19: Secondary | ICD-10-CM | POA: Diagnosis present

## 2020-04-14 DIAGNOSIS — E785 Hyperlipidemia, unspecified: Secondary | ICD-10-CM | POA: Diagnosis present

## 2020-04-14 DIAGNOSIS — I5032 Chronic diastolic (congestive) heart failure: Secondary | ICD-10-CM | POA: Diagnosis present

## 2020-04-14 DIAGNOSIS — I252 Old myocardial infarction: Secondary | ICD-10-CM

## 2020-04-14 DIAGNOSIS — M1990 Unspecified osteoarthritis, unspecified site: Secondary | ICD-10-CM | POA: Diagnosis present

## 2020-04-14 LAB — COMPREHENSIVE METABOLIC PANEL
ALT: 20 U/L (ref 0–44)
AST: 20 U/L (ref 15–41)
Albumin: 3.2 g/dL — ABNORMAL LOW (ref 3.5–5.0)
Alkaline Phosphatase: 56 U/L (ref 38–126)
Anion gap: 9 (ref 5–15)
BUN: 17 mg/dL (ref 8–23)
CO2: 23 mmol/L (ref 22–32)
Calcium: 8.7 mg/dL — ABNORMAL LOW (ref 8.9–10.3)
Chloride: 107 mmol/L (ref 98–111)
Creatinine, Ser: 1.24 mg/dL (ref 0.61–1.24)
GFR, Estimated: >60 mL/min (ref >60)
Glucose, Bld: 124 mg/dL — ABNORMAL HIGH (ref 70–99)
Potassium: 3.8 mmol/L (ref 3.5–5.1)
Sodium: 139 mmol/L (ref 135–145)
Total Bilirubin: 0.7 mg/dL (ref 0.3–1.2)
Total Protein: 6.3 g/dL — ABNORMAL LOW (ref 6.5–8.1)

## 2020-04-14 LAB — CBC
HCT: 22 % — ABNORMAL LOW (ref 39.0–52.0)
Hematocrit: 21.2 % — ABNORMAL LOW (ref 37.5–51.0)
Hemoglobin: 6.8 g/dL — CL (ref 13.0–17.7)
Hemoglobin: 7 g/dL — ABNORMAL LOW (ref 13.0–17.0)
MCH: 26.6 pg (ref 26.6–33.0)
MCH: 28 pg (ref 26.0–34.0)
MCHC: 32.1 g/dL (ref 31.5–35.7)
MCHC: 31.8 g/dL (ref 30.0–36.0)
MCV: 83 fL (ref 79–97)
MCV: 88 fL (ref 80.0–100.0)
Platelets: 296 10*3/uL (ref 150–450)
Platelets: 268 10*3/uL (ref 150–400)
RBC: 2.56 x10E6/uL — CL (ref 4.14–5.80)
RBC: 2.5 MIL/uL — ABNORMAL LOW (ref 4.22–5.81)
RDW: 14.8 % (ref 11.6–15.4)
RDW: 16 % — ABNORMAL HIGH (ref 11.5–15.5)
WBC: 10.7 10*3/uL (ref 3.4–10.8)
WBC: 9.7 10*3/uL (ref 4.0–10.5)
nRBC: 0 % (ref 0.0–0.2)

## 2020-04-14 LAB — SARS CORONAVIRUS 2 BY RT PCR (HOSPITAL ORDER, PERFORMED IN ~~LOC~~ HOSPITAL LAB): SARS Coronavirus 2: NEGATIVE

## 2020-04-14 LAB — TROPONIN I (HIGH SENSITIVITY): Troponin I (High Sensitivity): 8 ng/L (ref <18)

## 2020-04-14 LAB — PREPARE RBC (CROSSMATCH)

## 2020-04-14 LAB — POCT HEMOGLOBIN: Hemoglobin: 6.5 g/dL — AB (ref 11–14.6)

## 2020-04-14 MED ORDER — SODIUM CHLORIDE 0.9 % IV SOLN: INTRAVENOUS | Status: DC

## 2020-04-14 MED ORDER — ONDANSETRON HCL 4 MG/2ML IJ SOLN: 4.0000 mg | Freq: Once | INTRAMUSCULAR | Status: DC

## 2020-04-14 MED ORDER — CARVEDILOL 12.5 MG PO TABS: 25.0000 mg | ORAL_TABLET | Freq: Every day | ORAL | Status: DC

## 2020-04-14 MED ORDER — ALLOPURINOL 100 MG PO TABS
100.0000 mg | ORAL_TABLET | Freq: Every day | ORAL | Status: DC
Start: 2020-04-15 — End: 2020-04-17
  Administered 2020-04-15 – 2020-04-17 (×3): 100 mg via ORAL
  Filled 2020-04-14 (×3): qty 1

## 2020-04-14 MED ORDER — SODIUM CHLORIDE 0.9 % IV BOLUS: 1000.0000 mL | Freq: Once | INTRAVENOUS | Status: DC

## 2020-04-14 MED ORDER — ACETAMINOPHEN 500 MG PO TABS: 500.0000 mg | ORAL_TABLET | Freq: Four times a day (QID) | ORAL | Status: DC | PRN

## 2020-04-14 MED ORDER — IRBESARTAN 150 MG PO TABS: 150.0000 mg | ORAL_TABLET | Freq: Every day | ORAL | Status: DC

## 2020-04-14 MED ORDER — TECHNETIUM TC 99M-LABELED RED BLOOD CELLS IV KIT
25.0000 | PACK | Freq: Once | INTRAVENOUS | Status: AC | PRN
  Administered 2020-04-14: 25 via INTRAVENOUS

## 2020-04-14 MED ORDER — FERROUS SULFATE 325 (65 FE) MG PO TABS
325.0000 mg | ORAL_TABLET | Freq: Every morning | ORAL | Status: DC
  Administered 2020-04-15: 325 mg via ORAL
  Filled 2020-04-14 (×2): qty 1

## 2020-04-14 MED ORDER — PANTOPRAZOLE SODIUM 40 MG IV SOLR
40.0000 mg | Freq: Once | INTRAVENOUS | Status: AC
  Administered 2020-04-14: 40 mg via INTRAVENOUS
  Filled 2020-04-14: qty 40

## 2020-04-14 MED ORDER — SODIUM CHLORIDE 0.9 % IV SOLN: 10.0000 mL/h | Freq: Once | INTRAVENOUS | Status: DC

## 2020-04-14 NOTE — H&P (Addendum)
[facility] Admission History and Physical Service Pager: [phone]  Patient name: Alex [name] Medical record number: [id number] Date of birth: 06-01-1955 Age: 65 y.o. Gender: male  Primary Care Provider: Matilde Haymaker, MD Consultants: [name] Code Status: Full  Preferred Emergency Contact: Sister: Alex [name] [phone]  Chief Complaint: dizziness, bloody stools   Assessment and Plan: Alex [name] is a 65 y.o. male presenting with dark bloody stools . PMH is significant for prior history of [name] bleed, diverticulosis, CAD, HTN, GERD and Type 2 DM.   Rectal bleeding  Endorsing dark red bloody stools with every bowel movement that started about 5 days ago. Patient recently hospitalized for similar concern and discharged 04/11/2020. At that time, patient's symptoms thought to be due to diverticulosis given CT abdomen demonstrated sigmoid diverticulosis. Patient presented for clinic visit today where he was found to have hemoglobin of 6.5 and then 6.8 shortly after on repeat testing. Also found to be symptomatic complaining of dizziness, lightheadedness, progressive generalized weakness and dark red blood in stool. Patient then transported to ED. On admission, Hgb 7. Given history of CAD, transfusion threshold of 8. Patient given 1 unit PRBC. Given that patient is endorsing melena includes [name] bleed on the differential possibly from duodenal ulcer. Mallory Weiss tear less likely given lack of alcohol use. Also on the differential are diverticulosis which was patient has extensive history of and noted on most recent colonoscopy. Less likely IBS related to ulcerative colitis or internal vs external hemorrhoids given lack of hematochezia.  Alex [name] was consulted after admission and Alex [name] was contacted regarding next steps for [name] work-up.  Alex [name] recommended further abdominal imaging with either CTA abdomen or tagged RBC scan.  Following a conversation with  radiology, tagged RBC scan appeared to be the better test.  We will move forward with a tagged RBC scan. -Admit to FPTS, attending Alex [name] -[name] consulted, appreciate recommendations -transfusion threshold 8 -f/u Hgb pm  -hold aspirin due to potential [name] bleed, hold NSAIDs -bolus and mIVF -IV protonix 40 mg  -NPO until further [name] evaluation -f/u am labs: CMP and CBC  Chest pain Patient complaining of chest pain that is not associated with movement or exertion. Low suspicion that this chest pain is of cardiac etiology but pending further work up.  Given his history of CAD, he is at risk for cardiac events secondary to anemia. -trend troponin  -pending EKG -cardiac telemetry monitoring   Diverticulosis Most recent colonoscopy on 11/2018 notable for diverticulosis in the sigmoid and descending colon with multiple polyps (tubular adenomas) and non-bleeding internal hemorrhoids. Believed to be the cause of the [name] bleed during last hospital visit.   Normocytic anemia Hgb 7 on admission from 6.5 earlier in clinic. Possibly secondary to iron deficiency anemia. Home medications include oral iron supplementation. -f/u Hgb pm -consider further iron studies including ferritin  CAD Previous home medications include aspirin which patient was instructed not to take. Transfusion threshold is 8.  -continue to hold aspirin due to potential [name] bleed  Hypertension On admission BP 141/87 but currently normotensive at 124/76. Home medications include carvedilol 25 mg daily and irbesartan 300 mg daily, which patient is compliant on both. -hold home irbesartan given slight elevation in Cr -Holding home carvedilol in order to better assess vital stability.  Type 2 DM Most recent A1c 6.2 on 03/23/2020. Blood glucose on admission 124. Unclear if patient taking any related home medications.  Not on any home medications at  this time. -Continue to monitor blood glucose on daily blood work  Hypocalcemia  Ca  8.7 on admission, with corrected Ca 9.3 -continue to monitor    GERD Home medications include omeprazole 40 mg daily. -hold home med -IV protonix 40 mg   OSA Report nightly CPAP. -CPAP nightly  Gout Home medications include allopurinol daily along with colchicine as needed for flare ups. No current indication of flare at this time, chronic and stable. -continue allopurinol   FEN/[name]: NPO until evaluated by [name] Prophylaxis: SCDs  Disposition: admit to [facility], attending Alex [name]   History of Present Illness:  Alex [name] is a 65 y.o. male presenting with a [name] bleed.  His previous medical history is notable for diverticulosis, CAD, hyperlipidemia.  He has noticed these changes worsen over the past week since after returning home from hospital discharge on 1/31.  He is noted maroon-colored stools in addition to passing blood clots frequently in his bowel movements.  His bowel movements seem to be decreasing in frequency since they started soon after returning home. Denies fever, chills, congestion, nausea, vomiting and hematemesis. Endorsing LLQ discomfort. Denies alcohol and tobacco use.     Review Of Systems: Per HPI with the following additions:   Review of Systems  Constitutional: Positive for fatigue. Negative for fever.  HENT: Positive for congestion. Negative for rhinorrhea and trouble swallowing.   Eyes: Negative for visual disturbance.  Respiratory: Positive for shortness of breath.   Cardiovascular: Positive for chest pain. Negative for leg swelling.  Gastrointestinal: Positive for blood in stool. Negative for abdominal pain, nausea and vomiting.  Genitourinary: Negative for dysuria and hematuria.  Musculoskeletal: Negative for gait problem.  Neurological: Positive for dizziness, weakness and light-headedness. Negative for numbness.  Psychiatric/Behavioral: Negative for confusion.     Patient Active Problem List   Diagnosis Date Noted  . Bilateral renal cysts  04/12/2020  . Diverticulitis of colon with bleeding   . [name] bleed 04/10/2020  . Diverticulosis   . Myalgia due to statin 12/22/2019  . Pure hypercholesterolemia 12/22/2019  . Diverticulitis 06/11/2019  . Pain of right inguinal ring 05/18/2019  . Pain of left inguinal ring 05/18/2019  . History of [name] diverticular bleed 11/14/2018  . Anemia   . Diabetes mellitus without complication ([place])   . Rectal bleeding 11/08/2018  . Dehydration   . Postural dizziness with presyncope 09/05/2017  . Near syncope   . Ingrown toenail of left foot with infection 11/02/2016  . Screen for STD (sexually transmitted disease) 08/14/2016  . Fatigue 11/12/2014  . Vision changes 07/23/2014  . Right foot strain 05/14/2014  . GERD (gastroesophageal reflux disease) 12/17/2013  . Seasonal allergies 06/15/2013  . Dyspnea 05/16/2013  . Headache 12/03/2012  . Rash and nonspecific skin eruption 10/07/2012  . Internal hemorrhoids with other complication 16/94/5038  . Coronary artery disease 07/14/2012  . OSA (obstructive sleep apnea) 06/02/2012  . Chest pain 05/06/2012  . Diastolic heart failure ([place]) 04/02/2012  . History of tobacco use 02/22/2012  . Osteoarthritis 02/22/2012  . History of TIA (transient ischemic attack) 02/22/2012  . Obesity 07/27/2009  . INTERMITTENT CLAUDICATION 02/09/2009  . [place] DISEASE, LUMBAR SPINE 08/04/2008  . ESOPHAGEAL STRICTURE 05/20/2008  . Diabetes type 2, controlled ([place]) 02/23/2008  . HLD (hyperlipidemia) 02/23/2008  . Gout 02/23/2008  . Hypertension associated with diabetes ([place]) 02/23/2008    Past Medical History: Past Medical History:  Diagnosis Date  . Asthma   . CAD (coronary artery disease)  Cardiac cath 2002 with 25% distal RCA stenosis.   . DDD (degenerative disc disease), lumbar   . Diabetes mellitus ([place])   . Diverticulitis   . Esophageal stricture   . Gastritis 2010  . GERD (gastroesophageal reflux disease)   . Gout   . HTN  (hypertension)   . Hyperlipidemia   . Insomnia   . MI (myocardial infarction) ([place]) 2009   no stent placement  . OA (osteoarthritis)   . Obesity   . OSA on CPAP   . Seasonal allergies   . TIA (transient ischemic attack) 2013    Past Surgical History: Past Surgical History:  Procedure Laterality Date  . COLONOSCOPY WITH PROPOFOL N/A 11/11/2018   Procedure: COLONOSCOPY WITH PROPOFOL;  Surgeon: Thornton Park, MD;  Location: [facility];  Service: Gastroenterology;  Laterality: N/A;  . ESOPHAGOGASTRODUODENOSCOPY     multiple  . NASAL SINUS SURGERY    . POLYPECTOMY  11/11/2018   Procedure: POLYPECTOMY;  Surgeon: Thornton Park, MD;  Location[facility] ENDOSCOPY;  Service: Gastroenterology;;  . VASECTOMY      Social History: Social History   Tobacco Use  . Smoking status: Former Smoker    Packs/day: 1.50    Years: 10.00    Pack years: 15.00    Types: Cigarettes    Quit date: 11/06/1991    Years since quitting: 28.4  . Smokeless tobacco: Never Used  . Tobacco comment: quit 1993  Substance Use Topics  . Alcohol use: No  . Drug use: No   Please also refer to relevant sections of EMR.  Family History: Family History  Problem Relation Age of Onset  . Heart attack Mother   . Heart failure Mother   . Colon cancer Neg Hx   . Esophageal cancer Neg Hx   . Rectal cancer Neg Hx   . Stomach cancer Neg Hx     Allergies and Medications: Allergies  Allergen Reactions  . Atorvastatin Other (See Comments)    Subjective myalgias. Gave 2 trials, developed myalgias which resolved after cessation of medication.  Muscle cramps were 9/10 discomfort  . Codeine Nausea Only  . Shellfish Allergy Other (See Comments)    Activates asthma   No current facility-administered medications on file prior to encounter.   Current Outpatient Medications on File Prior to Encounter  Medication Sig Dispense Refill  . ACCU-CHEK FASTCLIX LANCETS MISC 1 Device by Does not apply route daily. 102 each 3   . acetaminophen (TYLENOL) 500 MG tablet Take 1 tablet (500 mg total) by mouth every 6 (six) hours as needed for headache (pain). 30 tablet 10  . allopurinol (ZYLOPRIM) 100 MG tablet Take 1 tablet (100 mg total) by mouth daily. 60 tablet 3  . Blood Glucose Monitoring Suppl (ACCU-CHEK AVIVA CONNECT) W/DEVICE KIT 1 Device by Does not apply route daily. 1 kit 0  . carvedilol (COREG) 25 MG tablet Take 1 tablet (25 mg total) by mouth daily with supper. 90 tablet 3  . colchicine 0.6 MG tablet Take 1 tablet (0.6 mg total) by mouth daily. 6 tablet 0  . Ferrous Sulfate (IRON) 325 (65 Fe) MG TABS Take 1 tablet (325 mg total) by mouth every morning. 90 tablet 3  . glucose blood test strip 1 each by Other route daily. Use as instructed 100 each 12  . irbesartan (AVAPRO) 300 MG tablet Take 0.5 tablets (150 mg total) by mouth daily. 60 tablet 2  . Lancets (FREESTYLE) lancets Use as instructed 100 each 12  . naproxen (  NAPROSYN) 500 MG tablet TAKE 1 TABLET BY MOUTH ONCE DAILY AS NEEDED FOR MODERATE PAIN (Patient taking differently: Take 500 mg by mouth every other day.) 30 tablet 0  . omeprazole (PRILOSEC) 40 MG capsule TAKE 1 CAPSULE BY MOUTH ONCE DAILY WITH SUPPER. (Patient taking differently: Take 40 mg by mouth every evening.) 90 capsule 3  . triamcinolone ointment (KENALOG) 0.5 % Apply to affected area. (Patient taking differently: Apply 1 application topically as needed (to affected area(s)).) 15 g 0    Objective: BP 123/81   Pulse 74   Temp 98.5 F (36.9 C) (Oral)   Resp 15   SpO2 99%  Exam: General: Patient sitting upright in bed, in no acute distress. Neck: supple, no lymphadenopathy, non-tender thyroid Cardiovascular: RRR, no murmurs or gallops auscultated, no evidence of JVD Respiratory: lungs clear to auscultation bilaterally, breathing comfortably on room air  Gastrointestinal: soft, non-tender, presence of active bowel sounds, no evidence of organomegaly MSK: radial pulses strong and equal  bilaterally, no LE edema noted bilaterally Derm: skin warm and dry to touch, no rashes or lesions noted Neuro: AOx4 Psych: mood appropriate  Labs and Imaging: CBC BMET  Recent Labs  Lab 04/14/20 1048  WBC 9.7  HGB 7.0*  HCT 22.0*  PLT 268   Recent Labs  Lab 04/14/20 1048  NA 139  K 3.8  CL 107  CO2 23  BUN 17  CREATININE 1.24  GLUCOSE 124*  CALCIUM 8.7*       CT ABDOMEN PELVIS W CONTRAST  Result Date: 04/10/2020 CLINICAL DATA:  Diverticulitis suspected. Abdominal pain since 1 a.m. EXAM: CT ABDOMEN AND PELVIS WITH CONTRAST TECHNIQUE: Multidetector CT imaging of the abdomen and pelvis was performed using the standard protocol following bolus administration of intravenous contrast. CONTRAST:  160m OMNIPAQUE IOHEXOL 300 MG/ML  SOLN COMPARISON:  CT abdomen pelvis 05/27/2019 FINDINGS: Lower chest: Subsegmental atelectasis of bilateral lower lobes. Hepatobiliary: Subcentimeter hypodensity within left hepatic lobe is too small to characterize. Otherwise no focal liver abnormality. Calcified gallstones within the gallbladder lumen. No gallbladder wall thickening or pericholecystic fluid. No biliary dilatation. Pancreas: No focal lesion. Normal pancreatic contour. No surrounding inflammatory changes. No main pancreatic ductal dilatation. Spleen: Normal in size without focal abnormality. Adrenals/Urinary Tract: No adrenal nodule bilaterally. Bilateral kidneys enhance symmetrically. There is a stable 4.8 cm hypodense lesion within the inferior pole of the left kidney that demonstrates a density of 22 Hounsfield units. Several other similar in size hypodensities demonstrate lesions demonstrate fluid density and likely represent simple renal cysts. No hydronephrosis. No hydroureter. The urinary bladder is unremarkable. Stomach/Bowel: Stomach is within normal limits. No evidence of bowel wall thickening or dilatation. Sigmoid diverticulosis. Appendix appears normal. Vascular/Lymphatic: No abdominal  aorta or iliac aneurysm. Mild atherosclerotic plaque. No abdominal, pelvic, or inguinal lymphadenopathy. Reproductive: Prostate is unremarkable. Other: No intraperitoneal free fluid. No intraperitoneal free gas. No organized fluid collection. Musculoskeletal: No abdominal wall hernia or abnormality No suspicious lytic or blastic osseous lesions. No acute displaced fracture. Multilevel mild degenerative changes of the spine. IMPRESSION: 1. Sigmoid diverticulosis with no definite findings to suggest acute diverticulitis. 2. Cholelithiasis with no acute cholecystitis. 3. Indeterminate stable in appearance 4.8 cm lesion within the left kidney could represent a complex cystic lesion versus solid masses. Consider MRI renal protocol for further evaluation. Electronically Signed   By: MIven FinnM.D.   On: 04/10/2020 01:13     GDonney Dice DO 04/14/2020, 12:24 PM PGY-1, [place] pager:  [phone], text pages welcome  FPTS Upper-Level Resident Addendum   I have independently interviewed and examined the patient. I have discussed the above with the original author and agree with their documentation. My edits for correction/addition/clarification are in blue. Please see also any attending notes.    Alex Haymaker MD PGY-1, [facility] 04/14/2020 6:23 PM

## 2020-04-14 NOTE — Progress Notes (Signed)
    SUBJECTIVE:   CHIEF COMPLAINT / HPI:   Symptomatic anemia  GI Bleed Patient was recently hospitalized on 1/29 for GI hemorrhage and seen by GI, who did not feel a scope was necessary at that time. He had a video visit with Dr. Chauncey Reading on 2/1 and came to the clinic for a lab check. Patient reports that the bleeding has only minimally improved and he is passing multiple quarter-sized burgundy clots 2-3 times per day. Patient has had increased light headedness and some dizziness with standing. He has had some shortness of breath, difficulty focusing, fatigue, subjective leg weakness, palpitations, and occasional headaches. He has never had a transfusion before, has been diagnosed with diverticulosis (had diverticulitis several years ago).  Patient reports that he lives alone and has been afraid of staying asleep because he has been passing these clots and has been afraid that his blood counts were dropping.   Patient previously on daily ASA, which he has not taken since his hospital admission on 1/29. He has also not taken Naproxen or ibuprofen since prior to admission.  PERTINENT  PMH / PSH: Diverticulosis, CAD, HF, HTN, GERD, T2DM  OBJECTIVE:   BP 132/72   Pulse 91   Wt (!) 326 lb 12.8 oz (148.2 kg)   SpO2 98%   BMI 51.18 kg/m   Gen: appears tired, sitting in chair in clinic, subdued  CV: non-tachycardic, no murmur appreciated, cap refill 2-3 sec Pulm: CTAB, normal WOB, comfortable appearing on room air MSK: 5/5 strength of b/l LE  ASSESSMENT/PLAN:   Symptomatic anemia GI bleed Patient was recently hospitalized on 1/29 for GI hemorrhage (Hgb 12.6 on 1/29), he was seen by GI in the hospital and it was determined to be diverticulosis. Hemoglobin trend was 12.6>11.4>9.2 upon discharge. On 2/1, patient completed a CBC with a Hgb of 8.4. Patient returned to care today for repeat check, on POCT Hgb check his Hgb was 6.5, a stat CBC was sent out. Patient is symptomatic (per HPI) and  transfusion threshold is 8 due to cardiac history. Patient sent from clinic to ED via wheelchair for transfusion and admission to the [facility] Medicine Service.    [name], [place]

## 2020-04-14 NOTE — Assessment & Plan Note (Addendum)
Patient was recently hospitalized on 1/29 for GI hemorrhage (Hgb 12.6 on 1/29), he was seen by GI in the hospital and it was determined to be diverticulosis. Hemoglobin trend was 12.6>11.4>9.2 upon discharge. On 2/1, patient completed a CBC with a Hgb of 8.4. Patient returned to care today for repeat check, on POCT Hgb check his Hgb was 6.5, a stat CBC was sent out. Patient is symptomatic (per HPI) and transfusion threshold is 8 due to cardiac history. Patient sent from clinic to ED via wheelchair for transfusion and admission to the [facility] Medicine Service.

## 2020-04-14 NOTE — Progress Notes (Signed)
RT note. Pt. Taught how to place CPAP on and turn CPAP on. Settings are auto titrate 18/4. RN made aware, RT will continue to monitor.

## 2020-04-14 NOTE — ED Notes (Signed)
This RN attempted to call report.  

## 2020-04-14 NOTE — ED Provider Notes (Signed)
[facility] EMERGENCY DEPARTMENT Provider Note   CSN: [id number] Arrival date & time: 04/14/20  1026     History Chief Complaint  Patient presents with  . Abnormal Lab    Alex [name] is a 65 y.o. male w PMHx diverticulitis, gastritis, MI, P3XT, CVA, diastolic HF, presenting to the ED from PCP office for low hgb in the setting of GI bleed. Patient was recently discharged from a hospital admission for GI bleeding on 04/11/2020. Per review of medical record, his hgb was trended during admission and it appears at discharge his hgb was 8.4, down from 12.6 on initial presentation. He did not undergo GI intervention nor did he get transfused. He was noted to be asymptomatic and stable and discharged. He states since he left the hospital he has been having 1-2 bowel movements of dark red blood clot per day.  He has had 1 bloody bowel movement today.  He states however he has been becoming much more fatigued and weak with some shortness of breath. He has some abdominal discomfort but no localized pain. Does not take anticoagulation.  He was seen at PCP today for recheck of his hemoglobin was noted to be 6.8 hemoglobin of 21.2.    The history is provided by the patient and medical records.       Past Medical History:  Diagnosis Date  . Asthma   . CAD (coronary artery disease)    Cardiac cath 2002 with 25% distal RCA stenosis.   . DDD (degenerative disc disease), lumbar   . Diabetes mellitus ([place])   . Diverticulitis   . Esophageal stricture   . Gastritis 2010  . GERD (gastroesophageal reflux disease)   . Gout   . HTN (hypertension)   . Hyperlipidemia   . Insomnia   . MI (myocardial infarction) ([place]) 2009   no stent placement  . OA (osteoarthritis)   . Obesity   . OSA on CPAP   . Seasonal allergies   . TIA (transient ischemic attack) 2013    Patient Active Problem List   Diagnosis Date Noted  . Bilateral renal cysts 04/12/2020  . Diverticulitis of colon with  bleeding   . GI bleed 04/10/2020  . Diverticulosis   . Myalgia due to statin 12/22/2019  . Pure hypercholesterolemia 12/22/2019  . Diverticulitis 06/11/2019  . Pain of right inguinal ring 05/18/2019  . Pain of left inguinal ring 05/18/2019  . History of GI diverticular bleed 11/14/2018  . Anemia   . Diabetes mellitus without complication ([place])   . Rectal bleeding 11/08/2018  . Dehydration   . Postural dizziness with presyncope 09/05/2017  . Near syncope   . Ingrown toenail of left foot with infection 11/02/2016  . Screen for STD (sexually transmitted disease) 08/14/2016  . Fatigue 11/12/2014  . Vision changes 07/23/2014  . Right foot strain 05/14/2014  . GERD (gastroesophageal reflux disease) 12/17/2013  . Seasonal allergies 06/15/2013  . Dyspnea 05/16/2013  . Headache 12/03/2012  . Rash and nonspecific skin eruption 10/07/2012  . Internal hemorrhoids with other complication 02/40/9735  . Coronary artery disease 07/14/2012  . OSA (obstructive sleep apnea) 06/02/2012  . Chest pain 05/06/2012  . Diastolic heart failure ([place]) 04/02/2012  . History of tobacco use 02/22/2012  . Osteoarthritis 02/22/2012  . History of TIA (transient ischemic attack) 02/22/2012  . Obesity 07/27/2009  . INTERMITTENT CLAUDICATION 02/09/2009  . [place] DISEASE, LUMBAR SPINE 08/04/2008  . ESOPHAGEAL STRICTURE 05/20/2008  . Diabetes type 2, controlled (  [place]) 02/23/2008  . HLD (hyperlipidemia) 02/23/2008  . Gout 02/23/2008  . Hypertension associated with diabetes ([place]) 02/23/2008    Past Surgical History:  Procedure Laterality Date  . COLONOSCOPY WITH PROPOFOL N/A 11/11/2018   Procedure: COLONOSCOPY WITH PROPOFOL;  Surgeon: Thornton Park, MD;  Location: [facility];  Service: Gastroenterology;  Laterality: N/A;  . ESOPHAGOGASTRODUODENOSCOPY     multiple  . NASAL SINUS SURGERY    . POLYPECTOMY  11/11/2018   Procedure: POLYPECTOMY;  Surgeon: Thornton Park, MD;  Location: [facility]  ENDOSCOPY;  Service: Gastroenterology;;  . VASECTOMY         Family History  Problem Relation Age of Onset  . Heart attack Mother   . Heart failure Mother   . Colon cancer Neg Hx   . Esophageal cancer Neg Hx   . Rectal cancer Neg Hx   . Stomach cancer Neg Hx     Social History   Tobacco Use  . Smoking status: Former Smoker    Packs/day: 1.50    Years: 10.00    Pack years: 15.00    Types: Cigarettes    Quit date: 11/06/1991    Years since quitting: 28.4  . Smokeless tobacco: Never Used  . Tobacco comment: quit 1993  Substance Use Topics  . Alcohol use: No  . Drug use: No    Home Medications Prior to Admission medications   Medication Sig Start Date End Date Taking? Authorizing Provider  ACCU-CHEK FASTCLIX LANCETS MISC 1 Device by Does not apply route daily. 01/26/14   Leone Haven, MD  acetaminophen (TYLENOL) 500 MG tablet Take 1 tablet (500 mg total) by mouth every 6 (six) hours as needed for headache (pain). 12/22/18   Matilde Haymaker, MD  allopurinol (ZYLOPRIM) 100 MG tablet Take 1 tablet (100 mg total) by mouth daily. 01/29/20   Matilde Haymaker, MD  Blood Glucose Monitoring Suppl (ACCU-CHEK AVIVA CONNECT) W/DEVICE KIT 1 Device by Does not apply route daily. 12/01/13   Leone Haven, MD  carvedilol (COREG) 25 MG tablet Take 1 tablet (25 mg total) by mouth daily with supper. 04/03/19   Matilde Haymaker, MD  colchicine 0.6 MG tablet Take 1 tablet (0.6 mg total) by mouth daily. 10/29/18   Matilde Haymaker, MD  Ferrous Sulfate (IRON) 325 (65 Fe) MG TABS Take 1 tablet (325 mg total) by mouth every morning. 05/26/19   Matilde Haymaker, MD  glucose blood test strip 1 each by Other route daily. Use as instructed 01/26/14   Leone Haven, MD  irbesartan (AVAPRO) 300 MG tablet Take 0.5 tablets (150 mg total) by mouth daily. 03/23/20   Matilde Haymaker, MD  Lancets (FREESTYLE) lancets Use as instructed 11/27/13   Lupita Dawn, MD  naproxen (NAPROSYN) 500 MG tablet TAKE 1 TABLET BY MOUTH  ONCE DAILY AS NEEDED FOR MODERATE PAIN Patient taking differently: Take 500 mg by mouth every other day. 02/10/20   Matilde Haymaker, MD  omeprazole (PRILOSEC) 40 MG capsule TAKE 1 CAPSULE BY MOUTH ONCE DAILY WITH SUPPER. Patient taking differently: Take 40 mg by mouth every evening. 11/30/19   Matilde Haymaker, MD  triamcinolone ointment (KENALOG) 0.5 % Apply to affected area. Patient taking differently: Apply 1 application topically as needed (to affected area(s)). 02/10/20   Matilde Haymaker, MD    Allergies    Atorvastatin, Codeine, and Shellfish allergy  Review of Systems   Review of Systems  Constitutional: Positive for fatigue.  Respiratory: Positive for shortness of breath.   Gastrointestinal: Positive for  abdominal pain.       Hematochezia  All other systems reviewed and are negative.   Physical Exam Updated Vital Signs BP 123/81   Pulse 74   Temp 98.5 F (36.9 C) (Oral)   Resp 15   SpO2 99%   Physical Exam Vitals and nursing note reviewed.  Constitutional:      General: He is not in acute distress.    Appearance: He is well-developed and well-nourished.  HENT:     Head: Normocephalic and atraumatic.  Eyes:     Conjunctiva/sclera: Conjunctivae normal.  Cardiovascular:     Rate and Rhythm: Normal rate and regular rhythm.  Pulmonary:     Effort: Pulmonary effort is normal. No respiratory distress.     Breath sounds: Normal breath sounds.  Abdominal:     General: Bowel sounds are normal.     Palpations: Abdomen is soft.     Tenderness: There is no abdominal tenderness.  Skin:    General: Skin is warm.  Neurological:     Mental Status: He is alert.  Psychiatric:        Mood and Affect: Mood and affect normal.        Behavior: Behavior normal.     ED Results / Procedures / Treatments   Labs (all labs ordered are listed, but only abnormal results are displayed) Labs Reviewed  CBC - Abnormal; Notable for the following components:      Result Value   RBC 2.50 (*)     Hemoglobin 7.0 (*)    HCT 22.0 (*)    RDW 16.0 (*)    All other components within normal limits  SARS CORONAVIRUS 2 BY RT PCR (HOSPITAL ORDER, [facility])  COMPREHENSIVE METABOLIC PANEL  TYPE AND SCREEN  PREPARE RBC (CROSSMATCH)    EKG None  Radiology No results found.  Procedures .Critical Care Performed by: Markes Shatswell, [place] N, PA-C Authorized by: Dereon Corkery, [place] N, PA-C   Critical care provider statement:    Critical care time (minutes):  45   Critical care time was exclusive of:  Separately billable procedures and treating other patients and teaching time   Critical care was necessary to treat or prevent imminent or life-threatening deterioration of the following conditions: symptomatic anemia due to GI bleed.   Critical care was time spent personally by me on the following activities:  Discussions with consultants, evaluation of patient's response to treatment, examination of patient, ordering and performing treatments and interventions, ordering and review of laboratory studies, ordering and review of radiographic studies, pulse oximetry, re-evaluation of patient's condition, obtaining history from patient or surrogate and review of old charts   I assumed direction of critical care for this patient from another provider in my specialty: no       Medications Ordered in ED Medications  0.9 %  sodium chloride infusion (has no administration in time range)    ED Course  I have reviewed the triage vital signs and the nursing notes.  Pertinent labs & imaging results that were available during my care of the patient were reviewed by me and considered in my medical decision making (see chart for details).  Clinical Course as of 04/14/20 1252  Thu Apr 14, 2020  1223 Family medicine teaching service accepting admission [JR]    Clinical Course User Index [JR] Brandin Dilday, [place] N, PA-C   MDM Rules/Calculators/A&P  Patient  presenting back to the ED from PCP with low hemoglobin.  He was seen in PCP today for recheck of hemoglobin after recent admission for GI bleed.  He endorses new symptoms of generalized fatigue with some shortness of breath.  Has some discomfort in his abdomen though no discrete pain.  Abdomen is benign on exam.  His vital signs are stable.  His hemoglobin today at PCP was 6.8, recheck here is 7. 1 unit of PRBC for transfusion for symptomatic anemia is ordered.  Patient will be admitted to family medicine service for GI bleed with symptomatic anemia.  He is stable at time of admission.  The patient appears reasonably stabilized for admission considering the current resources, flow, and capabilities available in the ED at this time, and I doubt any other [facility] requiring further screening and/or treatment in the ED prior to admission.  Final Clinical Impression(s) / ED Diagnoses Final diagnoses:  Symptomatic anemia  Gastrointestinal hemorrhage, unspecified gastrointestinal hemorrhage type    Rx / DC Orders ED Discharge Orders    None       Nazaire Cordial, [place] N, PA-C 04/14/20 [place], DO 04/14/20 1301

## 2020-04-14 NOTE — Consult Note (Signed)
Referring Provider:  EDP Primary Care Physician:  Matilde Haymaker, MD Primary Gastroenterologist:  Dr. Carlean Purl  Reason for Consultation:  GI bleed and anemia  HPI: Alex [name] is a 65 y.o. male PMH is significant for prior history of GI bleed, diverticulosis, CAD, HTN, GERD and Type 2 DM. Patient recently hospitalized for GI bleed and anemia, suspected to be divertular and was discharged 04/11/2020.  Hgb was 12.6 grams 5 days ago and was 8.4 grams on the day of discharge.  Here now is 6.5 grams.  BUN is normal.  He was seen by me in nuclear medicine just prior to them getting started with his study.  He says that he had about 7 episodes or so of passing blood with clots again since discharged on 1/31.  Last episode was this morning.  No urge to pass anything at this time.  No abdominal pain.  Has not yet received his blood transfusion since he was taken to nuc med but there has been one unit PRBCs ordered for him.  Had a similar episode in August/September 2020 at which time he had a colonoscopy as below.  Colonoscopy 11/2018 showed the following:  - Diverticulosis in the sigmoid colon and in the descending colon. - One 3 mm polyp in the descending colon, removed with a cold snare. Resected and retrieved. - One 4 mm polyp in the ascending colon, removed with a cold snare. Resected and retrieved. - Non-bleeding internal hemorrhoids. - Suspect a resolved diverticular bleed as the cause of his hematochezia.  Polyps were tubular adenomas.  Past Medical History:  Diagnosis Date  . Asthma   . CAD (coronary artery disease)    Cardiac cath 2002 with 25% distal RCA stenosis.   . DDD (degenerative disc disease), lumbar   . Diabetes mellitus ([place])   . Diverticulitis   . Esophageal stricture   . Gastritis 2010  . GERD (gastroesophageal reflux disease)   . Gout   . HTN (hypertension)   . Hyperlipidemia   . Insomnia   . MI (myocardial infarction) ([place]) 2009   no stent placement  . OA  (osteoarthritis)   . Obesity   . OSA on CPAP   . Seasonal allergies   . TIA (transient ischemic attack) 2013    Past Surgical History:  Procedure Laterality Date  . COLONOSCOPY WITH PROPOFOL N/A 11/11/2018   Procedure: COLONOSCOPY WITH PROPOFOL;  Surgeon: Thornton Park, MD;  Location: [facility];  Service: Gastroenterology;  Laterality: N/A;  . ESOPHAGOGASTRODUODENOSCOPY     multiple  . NASAL SINUS SURGERY    . POLYPECTOMY  11/11/2018   Procedure: POLYPECTOMY;  Surgeon: Thornton Park, MD;  Location: [facility];  Service: Gastroenterology;;  . VASECTOMY      Prior to Admission medications   Medication Sig Start Date End Date Taking? Authorizing Provider  acetaminophen (TYLENOL) 500 MG tablet Take 500 mg by mouth every 6 (six) hours as needed for moderate pain.   Yes [provider]  allopurinol (ZYLOPRIM) 100 MG tablet Take 1 tablet (100 mg total) by mouth daily. 01/29/20  Yes Matilde Haymaker, MD  carvedilol (COREG) 25 MG tablet Take 1 tablet (25 mg total) by mouth daily with supper. 04/03/19  Yes Matilde Haymaker, MD  Ferrous Sulfate (IRON) 325 (65 Fe) MG TABS Take 1 tablet (325 mg total) by mouth every morning. 05/26/19  Yes Matilde Haymaker, MD  irbesartan (AVAPRO) 150 MG tablet Take 150 mg by mouth daily.   Yes [provider]  naproxen (NAPROSYN) 500 MG tablet TAKE 1 TABLET BY MOUTH ONCE DAILY AS NEEDED FOR MODERATE PAIN Patient taking differently: Take 500 mg by mouth daily in the afternoon. 02/10/20  Yes Matilde Haymaker, MD  omeprazole (PRILOSEC) 40 MG capsule TAKE 1 CAPSULE BY MOUTH ONCE DAILY WITH SUPPER. Patient taking differently: Take 40 mg by mouth every evening. 11/30/19  Yes Matilde Haymaker, MD  ACCU-CHEK FASTCLIX LANCETS MISC 1 Device by Does not apply route daily. 01/26/14   Leone Haven, MD  Blood Glucose Monitoring Suppl (ACCU-CHEK AVIVA CONNECT) W/DEVICE KIT 1 Device by Does not apply route daily. 12/01/13   Leone Haven, MD  colchicine 0.6 MG tablet  Take 1 tablet (0.6 mg total) by mouth daily. Patient taking differently: Take 0.6 mg by mouth daily as needed (gout). 10/29/18   Matilde Haymaker, MD  glucose blood test strip 1 each by Other route daily. Use as instructed 01/26/14   Leone Haven, MD  Lancets (FREESTYLE) lancets Use as instructed 11/27/13   Lupita Dawn, MD  triamcinolone ointment (KENALOG) 0.5 % Apply to affected area. Patient taking differently: Apply 1 application topically as needed (to affected area(s)). 02/10/20   Matilde Haymaker, MD    Current Facility-Administered Medications  Medication Dose Route Frequency Provider Last Rate Last Admin  . 0.9 %  sodium chloride infusion  10 mL/hr Intravenous Once Robinson, [place] N, PA-C       Current Outpatient Medications  Medication Sig Dispense Refill  . acetaminophen (TYLENOL) 500 MG tablet Take 500 mg by mouth every 6 (six) hours as needed for moderate pain.    Marland Kitchen allopurinol (ZYLOPRIM) 100 MG tablet Take 1 tablet (100 mg total) by mouth daily. 60 tablet 3  . carvedilol (COREG) 25 MG tablet Take 1 tablet (25 mg total) by mouth daily with supper. 90 tablet 3  . Ferrous Sulfate (IRON) 325 (65 Fe) MG TABS Take 1 tablet (325 mg total) by mouth every morning. 90 tablet 3  . irbesartan (AVAPRO) 150 MG tablet Take 150 mg by mouth daily.    . naproxen (NAPROSYN) 500 MG tablet TAKE 1 TABLET BY MOUTH ONCE DAILY AS NEEDED FOR MODERATE PAIN (Patient taking differently: Take 500 mg by mouth daily in the afternoon.) 30 tablet 0  . omeprazole (PRILOSEC) 40 MG capsule TAKE 1 CAPSULE BY MOUTH ONCE DAILY WITH SUPPER. (Patient taking differently: Take 40 mg by mouth every evening.) 90 capsule 3  . ACCU-CHEK FASTCLIX LANCETS MISC 1 Device by Does not apply route daily. 102 each 3  . Blood Glucose Monitoring Suppl (ACCU-CHEK AVIVA CONNECT) W/DEVICE KIT 1 Device by Does not apply route daily. 1 kit 0  . colchicine 0.6 MG tablet Take 1 tablet (0.6 mg total) by mouth daily. (Patient taking differently:  Take 0.6 mg by mouth daily as needed (gout).) 6 tablet 0  . glucose blood test strip 1 each by Other route daily. Use as instructed 100 each 12  . Lancets (FREESTYLE) lancets Use as instructed 100 each 12  . triamcinolone ointment (KENALOG) 0.5 % Apply to affected area. (Patient taking differently: Apply 1 application topically as needed (to affected area(s)).) 15 g 0    Allergies as of 04/14/2020 - Review Complete 04/14/2020  Allergen Reaction Noted  . Atorvastatin Other (See Comments) 04/02/2012  . Codeine Nausea Only 02/23/2008  . Shellfish allergy Other (See Comments) 07/14/2013    Family History  Problem Relation Age of Onset  . Heart attack Mother   . Heart failure  Mother   . Colon cancer Neg Hx   . Esophageal cancer Neg Hx   . Rectal cancer Neg Hx   . Stomach cancer Neg Hx     Social History   Socioeconomic History  . Marital status: Widowed    Spouse name: Not on file  . Number of children: 4  . Years of education: 100  . Highest education level: Not on file  Occupational History  . Occupation: Disability-truck driver  Tobacco Use  . Smoking status: Former Smoker    Packs/day: 1.50    Years: 10.00    Pack years: 15.00    Types: Cigarettes    Quit date: 11/06/1991    Years since quitting: 28.4  . Smokeless tobacco: Never Used  . Tobacco comment: quit 1993  Substance and Sexual Activity  . Alcohol use: No  . Drug use: No  . Sexual activity: Not on file  Other Topics Concern  . Not on file  Social History Narrative   On disability   [employer]   Quit smoking 20 years ago (as of 01/2012)      Health Care POA:    Emergency Contact: brother, Mitzi Hansen [phone]   End of Life Plan: gave Ad pamphlet 5/15   Who lives with you: self   Any pets: none   Diet: pt has a varied diet of protein, starch and vegetables.   Exercise: pt does not have regular exercise routine.   Seatbelts: Pt reports wearing seatbelt when in vehicles.    Hobbies: plays bass and keyboard in  church music group         Social Determinants of Health   Financial Resource Strain: Not on file  Food Insecurity: Not on file  Transportation Needs: Not on file  Physical Activity: Not on file  Stress: Not on file  Social Connections: Not on file  Intimate Partner Violence: Not on file    Review of Systems: ROS is O/W negative except as mentioned in HPI.  Physical Exam: Vital signs in last 24 hours: Temp:  [98.5 F (36.9 C)] 98.5 F (36.9 C) (02/03 1037) Pulse Rate:  [74-91] 80 (02/03 1300) Resp:  [15-20] 19 (02/03 1300) BP: (123-141)/(72-87) 124/76 (02/03 1230) SpO2:  [98 %-100 %] 100 % (02/03 1300) Weight:  [148.2 kg] 148.2 kg (02/03 0931)   General:  Alert, Well-developed, well-nourished, pleasant and cooperative in NAD Head:  Normocephalic and atraumatic. Eyes:  Sclera clear, no icterus.  Conjunctiva pink. Ears:  Normal auditory acuity. Mouth:  No deformity or lesions.   Lungs:  Clear throughout to auscultation.  No wheezes, crackles, or rhonchi.  Heart:  Regular rate and rhythm; no murmurs, clicks, rubs, or gallops. Abdomen:  Soft, obese, non-distended.  BS present.  Non-tender.  Msk:  Symmetrical without gross deformities. Pulses:  Normal pulses noted. Extremities:  Without clubbing or edema. Neurologic:  Alert and oriented x 4;  grossly normal neurologically. Skin:  Intact without significant lesions or rashes. Psych:  Alert and cooperative. Normal mood and affect.  Lab Results: Recent Labs    04/12/20 1557 04/14/20 0925 04/14/20 0937 04/14/20 1048  WBC 10.8  --  10.7 9.7  HGB 8.4* 6.5* 6.8* 7.0*  HCT 25.7*  --  21.2* 22.0*  PLT 272  --  296 268   BMET Recent Labs    04/12/20 1557 04/14/20 1048  NA 140 139  K 4.2 3.8  CL 104 107  CO2 21 23  GLUCOSE 115* 124*  BUN 17 17  CREATININE 1.20 1.24  CALCIUM 8.8 8.7*   LFT Recent Labs    04/14/20 1048  PROT 6.3*  ALBUMIN 3.2*  AST 20  ALT 20  ALKPHOS 56  BILITOT 0.7   IMPRESSION:  *GI  bleed:  Just discharged on 1/31 for hospital stay for this same reason.  Was presumed to be diverticular as he had similar in 2020 at which time he had a colonoscopy.  He is in nuc med for bleeding scan. *ABLA:  Hgb 12.6 grams 5 days ago and is down to 6.5 grams here.  One unit of PRBCs has been ordered.  PLAN: -Await results of bleeding scan. -Transfuse and monitor Hgb.  Laban Emperor. Hiep Ollis  04/14/2020, 1:53 PM

## 2020-04-14 NOTE — Plan of Care (Signed)
  Problem: Education: Goal: Knowledge of General Education information will improve Description Including pain rating scale, medication(s)/side effects and non-pharmacologic comfort measures Outcome: Progressing   

## 2020-04-14 NOTE — ED Triage Notes (Signed)
Pt sent from family practice due to critical  Hemoglobin of 6.5 a drop from 8.4 on Tuesday. He was admitted to hospital over this last weekend for rectal bleeding and has been home since Monday. He reports still having maroon rectal bleeding with clots, around 1-2 episodes each day. He seems to think the bleeding has slowed down.

## 2020-04-15 ENCOUNTER — Encounter (HOSPITAL_COMMUNITY): Payer: Self-pay | Admitting: Family Medicine

## 2020-04-15 DIAGNOSIS — D62 Acute posthemorrhagic anemia: Secondary | ICD-10-CM | POA: Diagnosis present

## 2020-04-15 DIAGNOSIS — I2510 Atherosclerotic heart disease of native coronary artery without angina pectoris: Secondary | ICD-10-CM | POA: Diagnosis present

## 2020-04-15 DIAGNOSIS — Z20822 Contact with and (suspected) exposure to covid-19: Secondary | ICD-10-CM | POA: Diagnosis present

## 2020-04-15 DIAGNOSIS — Z6841 Body Mass Index (BMI) 40.0 and over, adult: Secondary | ICD-10-CM | POA: Diagnosis not present

## 2020-04-15 DIAGNOSIS — Z79899 Other long term (current) drug therapy: Secondary | ICD-10-CM | POA: Diagnosis not present

## 2020-04-15 DIAGNOSIS — Z8719 Personal history of other diseases of the digestive system: Secondary | ICD-10-CM

## 2020-04-15 DIAGNOSIS — I252 Old myocardial infarction: Secondary | ICD-10-CM | POA: Diagnosis not present

## 2020-04-15 DIAGNOSIS — I152 Hypertension secondary to endocrine disorders: Secondary | ICD-10-CM | POA: Diagnosis present

## 2020-04-15 DIAGNOSIS — Z8673 Personal history of transient ischemic attack (TIA), and cerebral infarction without residual deficits: Secondary | ICD-10-CM | POA: Diagnosis not present

## 2020-04-15 DIAGNOSIS — K5731 Diverticulosis of large intestine without perforation or abscess with bleeding: Secondary | ICD-10-CM | POA: Diagnosis present

## 2020-04-15 DIAGNOSIS — J45909 Unspecified asthma, uncomplicated: Secondary | ICD-10-CM | POA: Diagnosis present

## 2020-04-15 DIAGNOSIS — K922 Gastrointestinal hemorrhage, unspecified: Secondary | ICD-10-CM | POA: Diagnosis present

## 2020-04-15 DIAGNOSIS — K219 Gastro-esophageal reflux disease without esophagitis: Secondary | ICD-10-CM | POA: Diagnosis present

## 2020-04-15 DIAGNOSIS — Z9852 Vasectomy status: Secondary | ICD-10-CM | POA: Diagnosis not present

## 2020-04-15 DIAGNOSIS — K5791 Diverticulosis of intestine, part unspecified, without perforation or abscess with bleeding: Secondary | ICD-10-CM | POA: Diagnosis not present

## 2020-04-15 DIAGNOSIS — G4733 Obstructive sleep apnea (adult) (pediatric): Secondary | ICD-10-CM | POA: Diagnosis present

## 2020-04-15 DIAGNOSIS — E669 Obesity, unspecified: Secondary | ICD-10-CM | POA: Diagnosis present

## 2020-04-15 DIAGNOSIS — E1169 Type 2 diabetes mellitus with other specified complication: Secondary | ICD-10-CM | POA: Diagnosis present

## 2020-04-15 DIAGNOSIS — M109 Gout, unspecified: Secondary | ICD-10-CM | POA: Diagnosis present

## 2020-04-15 DIAGNOSIS — I5032 Chronic diastolic (congestive) heart failure: Secondary | ICD-10-CM | POA: Diagnosis present

## 2020-04-15 DIAGNOSIS — E1151 Type 2 diabetes mellitus with diabetic peripheral angiopathy without gangrene: Secondary | ICD-10-CM | POA: Diagnosis present

## 2020-04-15 DIAGNOSIS — E785 Hyperlipidemia, unspecified: Secondary | ICD-10-CM | POA: Diagnosis present

## 2020-04-15 DIAGNOSIS — M1990 Unspecified osteoarthritis, unspecified site: Secondary | ICD-10-CM | POA: Diagnosis present

## 2020-04-15 DIAGNOSIS — I248 Other forms of acute ischemic heart disease: Secondary | ICD-10-CM | POA: Diagnosis present

## 2020-04-15 DIAGNOSIS — I110 Hypertensive heart disease with heart failure: Secondary | ICD-10-CM | POA: Diagnosis present

## 2020-04-15 DIAGNOSIS — M5136 Other intervertebral disc degeneration, lumbar region: Secondary | ICD-10-CM | POA: Diagnosis present

## 2020-04-15 DIAGNOSIS — D649 Anemia, unspecified: Secondary | ICD-10-CM | POA: Diagnosis not present

## 2020-04-15 LAB — COMPREHENSIVE METABOLIC PANEL
ALT: 19 U/L (ref 0–44)
AST: 18 U/L (ref 15–41)
Albumin: 3 g/dL — ABNORMAL LOW (ref 3.5–5.0)
Alkaline Phosphatase: 55 U/L (ref 38–126)
Anion gap: 8 (ref 5–15)
BUN: 14 mg/dL (ref 8–23)
CO2: 23 mmol/L (ref 22–32)
Calcium: 8.5 mg/dL — ABNORMAL LOW (ref 8.9–10.3)
Chloride: 107 mmol/L (ref 98–111)
Creatinine, Ser: 1.14 mg/dL (ref 0.61–1.24)
GFR, Estimated: >60 mL/min (ref >60)
Glucose, Bld: 96 mg/dL (ref 70–99)
Potassium: 3.6 mmol/L (ref 3.5–5.1)
Sodium: 138 mmol/L (ref 135–145)
Total Bilirubin: 1 mg/dL (ref 0.3–1.2)
Total Protein: 6 g/dL — ABNORMAL LOW (ref 6.5–8.1)

## 2020-04-15 LAB — CBC
HCT: 22.1 % — ABNORMAL LOW (ref 39.0–52.0)
HCT: 23.9 % — ABNORMAL LOW (ref 39.0–52.0)
Hemoglobin: 7 g/dL — ABNORMAL LOW (ref 13.0–17.0)
Hemoglobin: 7.7 g/dL — ABNORMAL LOW (ref 13.0–17.0)
MCH: 27.1 pg (ref 26.0–34.0)
MCH: 27.8 pg (ref 26.0–34.0)
MCHC: 31.7 g/dL (ref 30.0–36.0)
MCHC: 32.2 g/dL (ref 30.0–36.0)
MCV: 85.7 fL (ref 80.0–100.0)
MCV: 86.3 fL (ref 80.0–100.0)
Platelets: 263 10*3/uL (ref 150–400)
Platelets: 253 10*3/uL (ref 150–400)
RBC: 2.58 MIL/uL — ABNORMAL LOW (ref 4.22–5.81)
RBC: 2.77 MIL/uL — ABNORMAL LOW (ref 4.22–5.81)
RDW: 15.9 % — ABNORMAL HIGH (ref 11.5–15.5)
RDW: 15.6 % — ABNORMAL HIGH (ref 11.5–15.5)
WBC: 9.6 10*3/uL (ref 4.0–10.5)
WBC: 8.7 10*3/uL (ref 4.0–10.5)
nRBC: 0 % (ref 0.0–0.2)
nRBC: 0 % (ref 0.0–0.2)

## 2020-04-15 LAB — PREPARE RBC (CROSSMATCH)

## 2020-04-15 MED ORDER — SODIUM CHLORIDE 0.9% IV SOLUTION: Freq: Once | INTRAVENOUS | Status: DC

## 2020-04-15 MED ORDER — SODIUM CHLORIDE 0.9% IV SOLUTION: Freq: Once | INTRAVENOUS | Status: AC

## 2020-04-15 NOTE — Hospital Course (Addendum)
Alex [name] is a 65 y.o. male presenting with dark bloody stools . PMH is significant for prior history of diverticular GI bleed, diverticulosis, CAD, HTN, GERD and Type 2 DM.   Rectal bleeding Patient recently discharged from the hospital for diverticular bleed. Presented to clinic for follow up with Hgb 6.8 with symptomatic anemia. Admitted with Hgb 7. Given 1 pRBC and still remained unchanged. GI consulted and recommended tagged RBC scan which demonstrated diverticular bleed. Per GI recommendations, IR consulted for mesenteric angiography and possible embolization but determined that the bleed has clinically stopped and further intervention not necessary. Patient was given total of 4U pRBC and Hgb on discharge was 9.5 with resolution of symptoms. Iron supplementation was discontinued in the hospital due to dark stools complicating clinical picture and monitoring.   Chest pain Endorsing chest pain on admission. Troponins wnl, likely due to demand ischemia. Resolution of chest pain soon after admission and throughout remainder of hospitalization.   HTN Patient admitted to the hospital normotensive. BP medications of carvedilol 25mg  daily and irbesartan 300mg  daily were held due to slightly elevated creatinine as well as normotensive pressures. On day of discharge, patient was noted to have some elevations of systolic blood pressures in the 140s, medications were not restarted. Patient was advised to check pressures at home and will have close outpatient follow-up.    All other issues chronic and stable.   Issues for follow up: Follow up PCP for Hgb check on 2/7, with daily Hgb checks for 3-5 days. Iron supplementation stopped during hospital stay due to dark stools complicating clinical picture and monitoring. Consider restarting supplementation once patient hemoglobin remains stable. F/u PCP for HTN, BP medications were held due to creatinine elevation and normotensive readings in the  hospital. On day of discharge, readings were mildly elevated, recommend reassessment for restarting meds. Check A1c to assess whether patient needs diabetic regimen initiated, currently he is not taking anything for his diabetes.

## 2020-04-15 NOTE — Progress Notes (Signed)
Family Medicine Teaching Service Daily Progress Note Intern Pager: [phone]  Patient name: Alex [name] Medical record number: [id number] Date of birth: [birth year] Age: 65 y.o. Gender: male  Primary Care Provider: Matilde Haymaker, MD Consultants: GI Code Status: Full   Pt Overview and Major Events to Date:  2/3 Admitted   Assessment and Plan: Alex [name] is a 65 y.o. male presenting with dark bloody stools . PMH is significant for prior history of diverticular GI bleed, diverticulosis, CAD, HTN, GERD and Type 2 DM.   Rectal bleeding  Possibly secondary to diverticular bleed given recent scan and medical history Hgb 7 this morning, transfusion threshold 8 given history of CAD. Previous CT abdomen demonstrated sigmoid diverticulosis. Patient presented for clinic visit today where he was found to have hemoglobin of 6.5 and then 6.8 shortly after on repeat testing. S/p total 2 units PRBC. GI consulted and recommended tagged RBC scan which demonstrated bleeding within the sigmoid colon, suggestive of diverticular bleed. Repeat Hgb after second unit pRBC is 7.7, will need another unit.  -GI following, appreciate recommendations and continued involvement  -ordered 1 unit pRBC, informed nurse  -transfusion threshold 8 -continue to hold aspirin  -mIVF -IV protonix 40 mg  -NPO until further GI evaluation -monitor CBC  Chest pain Likely resolved, patient complained of chest pain that is not associated with movement or exertion on admission but denying current pain. Low suspicion that this chest pain is of cardiac etiology but pending further work up.  Given history of CAD, he is at risk for cardiac events secondary to anemia. Troponin 8. Likely due to demand ischemia.  -monitor   Diverticulosis Most recent colonoscopy on 11/2018 notable for diverticulosis in the sigmoid and descending colon with multiple polyps (tubular adenomas) and non-bleeding internal hemorrhoids. Believed to be the cause  of the GI bleed during last hospital visit.   Normocytic anemia Hgb 7 this morning, stable from yesterday.Marland Kitchen Possibly secondary to iron deficiency anemia. Home medications include oral iron supplementation. -f/u Hgb pm -consider further iron studies including ferritin  CAD Previous home medications include aspirin which patient was instructed not to take. Transfusion threshold is 8.  -continue to hold aspirin due to potential GI bleed  Hypertension On admission BP 141/87 but currently normotensive at 124/78. Home medications include carvedilol 25 mg daily and irbesartan 300 mg daily, compliant on both.  -continue to hold home irbesartan given slight elevation in Cr -continue to hold coreg   Type 2 DM Most recent A1c 6.2 on 03/23/2020. Not on any home medications. -monitor blood glucose  GERD Home medications include omeprazole 40 mg daily. -hold home med -IV protonix 40 mg   OSA Report nightly CPAP. -CPAP nightly  Gout Home medications include allopurinol daily along with colchicine as needed for flare ups. No current indication of flare at this time, chronic and stable. -continue allopurinol   FEN/GI: NPO pending GI recs  PPx: SCDs   Status is: Observation    Dispo: cardiac telemetry      Subjective:  No significant overnight events. Patient states that he feels better today compared to on admission. Reports being up occasionally including to use the restroom. Denies dizziness, lightheadedness, fatigue and balance issues. Denies any concerns at this time, states that he wants to stay in the hospital to ensure full workup and resolution of his symptoms. Has not had a bowel movement yet so unsure of bloody stool and denies hematuria.   Objective: Temp:  [98.2 F (36.8 C)-99.1  F (37.3 C)] 98.4 F (36.9 C) (02/04 0520) Pulse Rate:  [70-91] 70 (02/04 0520) Resp:  [15-20] 18 (02/04 0520) BP: (108-141)/(64-87) 124/78 (02/04 0520) SpO2:  [94 %-100 %] 96 %  (02/04 0520) Weight:  [148.2 kg] 148.2 kg (02/03 0931) Physical Exam: General: Patient sitting upright in bed, in no acute distress. Cardiovascular: RRR, no murmurs or gallops auscultated  Respiratory: lungs clear to auscultation bilaterally Abdomen: soft, nontender, presence of active bowel sounds, no evidence of organomegaly Extremities: mild non-pitting LE edema, radial and dorsalis pedis pulses strong and equal bilaterally  Derm: skin warm and dry to touch  Neuro: AOx4, appropriately conversational Psych: mood appropriate   Laboratory: Recent Labs  Lab 04/14/20 0937 04/14/20 1048 04/15/20 0032  WBC 10.7 9.7 9.6  HGB 6.8* 7.0* 7.0*  HCT 21.2* 22.0* 22.1*  PLT 296 268 263   Recent Labs  Lab 04/09/20 1827 04/10/20 0440 04/11/20 0152 04/12/20 1557 04/14/20 1048  NA 140   < > 137 140 139  K 4.1   < > 4.2 4.2 3.8  CL 105   < > 105 104 107  CO2 23   < > 22 21 23   BUN 18   < > 24* 17 17  CREATININE 1.03   < > 1.24 1.20 1.24  CALCIUM 9.1   < > 8.9 8.8 8.7*  PROT 7.3  --   --   --  6.3*  BILITOT 0.7  --   --   --  0.7  ALKPHOS 71  --   --   --  56  ALT 31  --   --   --  20  AST 28  --   --   --  20  GLUCOSE 105*   < > 128* 115* 124*   < > = values in this interval not displayed.      Imaging/Diagnostic Tests: NM GI Blood Loss  Result Date: 04/14/2020 CLINICAL DATA:  Dark bloody stools for 5 days.  Anemia. EXAM: NUCLEAR MEDICINE GASTROINTESTINAL BLEEDING SCAN TECHNIQUE: Sequential abdominal images were obtained following intravenous administration of Tc-24m labeled red blood cells. RADIOPHARMACEUTICALS:  26 mCi Tc-8m pertechnetate in-vitro labeled red cells. COMPARISON:  04/10/2020 CT abdomen/pelvis FINDINGS: During the first hour of imaging, there is subtle curvilinear activity in the left lower abdomen, which does not appreciably traverse into other portions of the abdomen. This activity is less pronounced on the second hour of imaging. No additional sites of abnormal  labeled red blood cell activity. IMPRESSION: Subtle curvilinear tagged red cell activity in the left lower abdomen on the first hour of imaging, which does not appreciably traverse into other portions of the abdomen, and which is less pronounced during the second hour of imaging. This location correlates with the sigmoid colon on the recent CT study and is suggestive of a subtle/slow sigmoid diverticular bleed. Electronically Signed   By: Ilona Sorrel M.D.   On: 04/14/2020 18:11    Donney Dice, DO 04/15/2020, 6:08 AM PGY-1, [place] Intern pager: [phone], text pages welcome

## 2020-04-15 NOTE — Consult Note (Signed)
Chief Complaint: Patient was seen in consultation today for melena  Referring Physician(s): Dr. Rush Landmark  Supervising Physician: Aletta Edouard  Patient Status: [facility] - In-pt  History of Present Illness: Alex [name] is a 65 y.o. male with past medical history of diverticulosis/diverticulitis, recent GI bleed, CAD, HTN, GERD, DM who was recently admitted with rectal bleeding 04/09/20-04/11/20.  His bleeding was attributed to lower GI/diverticular bleeding which seemed to resolve with no further dark or bloody bowel movements observed.  He reports he did well at home for a few days then had a dark bowel movement with thick clots yesterday prompting him to return for evaluation.  A NM Study was performed yesterday at 1800 with subtle suggestion of sigmoid bleed. GI has consulted on the patient and recommended IR intervention. IR consulted for possible angiogram and embolization.   Alex [name] is assessed at bedside this AM.  He reports no further bloody movements.  He is lounging comfortably with gown and pants on in bed.  Denies abdominal pain, but reports occasional "bubbling."  Feels better since admission.  He has been NPO this AM.   Past Medical History:  Diagnosis Date  . Asthma   . CAD (coronary artery disease)    Cardiac cath 2002 with 25% distal RCA stenosis.   . DDD (degenerative disc disease), lumbar   . Diabetes mellitus ([place])   . Diverticulitis   . Esophageal stricture   . Gastritis 2010  . GERD (gastroesophageal reflux disease)   . Gout   . HTN (hypertension)   . Hyperlipidemia   . Insomnia   . MI (myocardial infarction) ([place]) 2009   no stent placement  . OA (osteoarthritis)   . Obesity   . OSA on CPAP   . Seasonal allergies   . TIA (transient ischemic attack) 2013    Past Surgical History:  Procedure Laterality Date  . COLONOSCOPY WITH PROPOFOL N/A 11/11/2018   Procedure: COLONOSCOPY WITH PROPOFOL;  Surgeon: Thornton Park, MD;  Location: [facility];   Service: Gastroenterology;  Laterality: N/A;  . ESOPHAGOGASTRODUODENOSCOPY     multiple  . NASAL SINUS SURGERY    . POLYPECTOMY  11/11/2018   Procedure: POLYPECTOMY;  Surgeon: Thornton Park, MD;  Location: [facility] ENDOSCOPY;  Service: Gastroenterology;;  . VASECTOMY      Allergies: Atorvastatin, Codeine, and Shellfish allergy  Medications: Prior to Admission medications   Medication Sig Start Date End Date Taking? Authorizing Provider  acetaminophen (TYLENOL) 500 MG tablet Take 500 mg by mouth every 6 (six) hours as needed for moderate pain.   Yes [provider]  allopurinol (ZYLOPRIM) 100 MG tablet Take 1 tablet (100 mg total) by mouth daily. 01/29/20  Yes Matilde Haymaker, MD  carvedilol (COREG) 25 MG tablet Take 1 tablet (25 mg total) by mouth daily with supper. 04/03/19  Yes Matilde Haymaker, MD  Ferrous Sulfate (IRON) 325 (65 Fe) MG TABS Take 1 tablet (325 mg total) by mouth every morning. 05/26/19  Yes Matilde Haymaker, MD  irbesartan (AVAPRO) 150 MG tablet Take 150 mg by mouth daily.   Yes [provider]  naproxen (NAPROSYN) 500 MG tablet TAKE 1 TABLET BY MOUTH ONCE DAILY AS NEEDED FOR MODERATE PAIN Patient taking differently: Take 500 mg by mouth daily in the afternoon. 02/10/20  Yes Matilde Haymaker, MD  omeprazole (PRILOSEC) 40 MG capsule TAKE 1 CAPSULE BY MOUTH ONCE DAILY WITH SUPPER. Patient taking differently: Take 40 mg by mouth every evening. 11/30/19  Yes Matilde Haymaker, MD  ACCU-CHEK  FASTCLIX LANCETS MISC 1 Device by Does not apply route daily. 01/26/14   Leone Haven, MD  Blood Glucose Monitoring Suppl (ACCU-CHEK AVIVA CONNECT) W/DEVICE KIT 1 Device by Does not apply route daily. 12/01/13   Leone Haven, MD  colchicine 0.6 MG tablet Take 1 tablet (0.6 mg total) by mouth daily. Patient taking differently: Take 0.6 mg by mouth daily as needed (gout). 10/29/18   Matilde Haymaker, MD  glucose blood test strip 1 each by Other route daily. Use as instructed 01/26/14    Leone Haven, MD  Lancets (FREESTYLE) lancets Use as instructed 11/27/13   Lupita Dawn, MD  triamcinolone ointment (KENALOG) 0.5 % Apply to affected area. Patient taking differently: Apply 1 application topically as needed (to affected area(s)). 02/10/20   Matilde Haymaker, MD     Family History  Problem Relation Age of Onset  . Heart attack Mother   . Heart failure Mother   . Colon cancer Neg Hx   . Esophageal cancer Neg Hx   . Rectal cancer Neg Hx   . Stomach cancer Neg Hx     Social History   Socioeconomic History  . Marital status: Widowed    Spouse name: Not on file  . Number of children: 4  . Years of education: 68  . Highest education level: Not on file  Occupational History  . Occupation: Disability-truck driver  Tobacco Use  . Smoking status: Former Smoker    Packs/day: 1.50    Years: 10.00    Pack years: 15.00    Types: Cigarettes    Quit date: 11/06/1991    Years since quitting: 28.4  . Smokeless tobacco: Never Used  . Tobacco comment: quit 1993  Substance and Sexual Activity  . Alcohol use: No  . Drug use: No  . Sexual activity: Not on file  Other Topics Concern  . Not on file  Social History Narrative   On disability   [employer]   Quit smoking 20 years ago (as of 01/2012)      Health Care POA:    Emergency Contact: brother, Alex [name] [phone]   End of Life Plan: gave Ad pamphlet 5/15   Who lives with you: self   Any pets: none   Diet: pt has a varied diet of protein, starch and vegetables.   Exercise: pt does not have regular exercise routine.   Seatbelts: Pt reports wearing seatbelt when in vehicles.    Hobbies: plays bass and keyboard in church music group         Social Determinants of Health   Financial Resource Strain: Not on file  Food Insecurity: Not on file  Transportation Needs: Not on file  Physical Activity: Not on file  Stress: Not on file  Social Connections: Not on file     Review of Systems: A 12 point ROS discussed and  pertinent positives are indicated in the HPI above.  All other systems are negative.  Review of Systems  Constitutional: Negative for fatigue and fever.  Respiratory: Negative for cough and shortness of breath.   Cardiovascular: Negative for chest pain.  Gastrointestinal: Positive for blood in stool. Negative for abdominal pain, nausea and vomiting.  Musculoskeletal: Negative for back pain.  Psychiatric/Behavioral: Negative for behavioral problems and confusion.    Vital Signs: BP 124/78   Pulse 70   Temp 98.4 F (36.9 C) (Oral)   Resp 18   SpO2 96%   Physical Exam Vitals and nursing note reviewed.  Constitutional:      General: He is not in acute distress.    Appearance: Normal appearance. He is not ill-appearing.  HENT:     Mouth/Throat:     Mouth: Mucous membranes are moist.     Pharynx: Oropharynx is clear.  Cardiovascular:     Rate and Rhythm: Normal rate and regular rhythm.  Pulmonary:     Effort: Pulmonary effort is normal. No respiratory distress.     Breath sounds: Normal breath sounds.  Abdominal:     General: Abdomen is flat. There is no distension.     Palpations: Abdomen is soft.     Tenderness: There is abdominal tenderness (mild central, not worse with palpation. ).  Skin:    General: Skin is warm and dry.  Neurological:     General: No focal deficit present.     Mental Status: He is alert and oriented to person, place, and time. Mental status is at baseline.  Psychiatric:        Mood and Affect: Mood normal.        Behavior: Behavior normal.        Thought Content: Thought content normal.        Judgment: Judgment normal.      MD Evaluation Airway: WNL Heart: WNL Abdomen: WNL Chest/ Lungs: WNL ASA  Classification: 3 Mallampati/Airway Score: One   Imaging: NM GI Blood Loss  Result Date: 04/14/2020 CLINICAL DATA:  Dark bloody stools for 5 days.  Anemia. EXAM: NUCLEAR MEDICINE GASTROINTESTINAL BLEEDING SCAN TECHNIQUE: Sequential abdominal  images were obtained following intravenous administration of Tc-51mlabeled red blood cells. RADIOPHARMACEUTICALS:  26 mCi Tc-942mertechnetate in-vitro labeled red cells. COMPARISON:  04/10/2020 CT abdomen/pelvis FINDINGS: During the first hour of imaging, there is subtle curvilinear activity in the left lower abdomen, which does not appreciably traverse into other portions of the abdomen. This activity is less pronounced on the second hour of imaging. No additional sites of abnormal labeled red blood cell activity. IMPRESSION: Subtle curvilinear tagged red cell activity in the left lower abdomen on the first hour of imaging, which does not appreciably traverse into other portions of the abdomen, and which is less pronounced during the second hour of imaging. This location correlates with the sigmoid colon on the recent CT study and is suggestive of a subtle/slow sigmoid diverticular bleed. Electronically Signed   By: JaIlona Sorrel.D.   On: 04/14/2020 18:11   CT ABDOMEN PELVIS W CONTRAST  Result Date: 04/10/2020 CLINICAL DATA:  Diverticulitis suspected. Abdominal pain since 1 a.m. EXAM: CT ABDOMEN AND PELVIS WITH CONTRAST TECHNIQUE: Multidetector CT imaging of the abdomen and pelvis was performed using the standard protocol following bolus administration of intravenous contrast. CONTRAST:  10038mMNIPAQUE IOHEXOL 300 MG/ML  SOLN COMPARISON:  CT abdomen pelvis 05/27/2019 FINDINGS: Lower chest: Subsegmental atelectasis of bilateral lower lobes. Hepatobiliary: Subcentimeter hypodensity within left hepatic lobe is too small to characterize. Otherwise no focal liver abnormality. Calcified gallstones within the gallbladder lumen. No gallbladder wall thickening or pericholecystic fluid. No biliary dilatation. Pancreas: No focal lesion. Normal pancreatic contour. No surrounding inflammatory changes. No main pancreatic ductal dilatation. Spleen: Normal in size without focal abnormality. Adrenals/Urinary Tract: No  adrenal nodule bilaterally. Bilateral kidneys enhance symmetrically. There is a stable 4.8 cm hypodense lesion within the inferior pole of the left kidney that demonstrates a density of 22 Hounsfield units. Several other similar in size hypodensities demonstrate lesions demonstrate fluid density and likely represent simple renal cysts. No hydronephrosis.  No hydroureter. The urinary bladder is unremarkable. Stomach/Bowel: Stomach is within normal limits. No evidence of bowel wall thickening or dilatation. Sigmoid diverticulosis. Appendix appears normal. Vascular/Lymphatic: No abdominal aorta or iliac aneurysm. Mild atherosclerotic plaque. No abdominal, pelvic, or inguinal lymphadenopathy. Reproductive: Prostate is unremarkable. Other: No intraperitoneal free fluid. No intraperitoneal free gas. No organized fluid collection. Musculoskeletal: No abdominal wall hernia or abnormality No suspicious lytic or blastic osseous lesions. No acute displaced fracture. Multilevel mild degenerative changes of the spine. IMPRESSION: 1. Sigmoid diverticulosis with no definite findings to suggest acute diverticulitis. 2. Cholelithiasis with no acute cholecystitis. 3. Indeterminate stable in appearance 4.8 cm lesion within the left kidney could represent a complex cystic lesion versus solid masses. Consider MRI renal protocol for further evaluation. Electronically Signed   By: Iven Finn M.D.   On: 04/10/2020 01:13    Labs:  CBC: Recent Labs    04/14/20 0937 04/14/20 1048 04/15/20 0032 04/15/20 0823  WBC 10.7 9.7 9.6 8.7  HGB 6.8* 7.0* 7.0* 7.7*  HCT 21.2* 22.0* 22.1* 23.9*  PLT 296 268 263 253    COAGS: No results for input(s): INR, APTT in the last 8760 hours.  BMP: Recent Labs    06/20/19 0402 09/09/19 1027 03/23/20 1601 04/09/20 1827 04/11/20 0152 04/12/20 1557 04/14/20 1048 04/15/20 0823  NA 138 141 141   < > 137 140 139 138  K 3.8 4.6 4.3   < > 4.2 4.2 3.8 3.6  CL 106 103 103   < > 105 104  107 107  CO2 21* 23 22   < > '22 21 23 23  ' GLUCOSE 122* 107* 87   < > 128* 115* 124* 96  BUN '16 13 20   ' < > 24* '17 17 14  ' CALCIUM 9.2 9.7 10.4*   < > 8.9 8.8 8.7* 8.5*  CREATININE 1.07 1.14 1.12   < > 1.24 1.20 1.24 1.14  GFRNONAA >60 68 69   < > >60 64 >60 >60  GFRAA >60 78 80  --   --  73  --   --    < > = values in this interval not displayed.    LIVER FUNCTION TESTS: Recent Labs    06/20/19 0402 04/09/20 1827 04/14/20 1048 04/15/20 0823  BILITOT 0.5 0.7 0.7 1.0  AST 34 '28 20 18  ' ALT '30 31 20 19  ' ALKPHOS 75 71 56 55  PROT 7.6 7.3 6.3* 6.0*  ALBUMIN 3.6 3.7 3.2* 3.0*    TUMOR MARKERS: No results for input(s): AFPTM, CEA, CA199, CHROMGRNA in the last 8760 hours.  Assessment and Plan: GI Bleed Patient presented to [facility] ED with dark melena with clot. Recently evaluated for same.  HgB 6.8, now s/p 2u PRBCs with improvement in Hgb to 7.7. No history of renal issues; creatinine 1.14. NM Study performed yesterday afternoon suggests subtle sigmoid bleed. Patient seen by GI who recommended IR consultation for possible angiogram.  Discussed case and available imaging with Dr. Kathlene Cote.  Unfortunately, NM study performed yesterday and no further bloody bowel movements since admission.  Unlikely that angiogram will be of value in patient without evidence of active, brisk bleeding.  Recommend ongoing conservative management per the discretion of GI and primary team. If significant rebleeding occurs would recommend repeat CTA Abdomen Pelvis  Discussed held with patient at bedside.  He understand the role of interventional radiology in his case as it currently stands.  We are available if needed.  Patient indicates he would  be agreeable to proceed if necessary.   Thank you for this interesting consult.  I greatly enjoyed meeting Alex [name] and look forward to participating in their care.  A copy of this report was sent to the requesting provider on this date.  Electronically  Signed: Docia Barrier, PA 04/15/2020, 11:29 AM   I spent a total of 40 Minutes    in face to face in clinical consultation, greater than 50% of which was counseling/coordinating care for GI bleed.

## 2020-04-15 NOTE — Progress Notes (Signed)
Patient was ordered to receive 2 units of blood this morning. First unit ordered at 0933, second unit ordered at 1048. These orders were discontinued at 1130 by Vena Rua, PA-C acknowledged at 1135 by Gaspar Bidding, RN. The cancellation of these units of blood was neither approved nor communicated to the primary team, who ordered the blood.  This patient's transfusion threshold is a hemoglobin of 8. Given clinical picture, recent decline in hemoglobin, and recent scan positive for diverticular bleeding, this blood administration is clinically indicated.  We will place an order for 2 units of blood to be administered tonight as soon as possible. Do not under any circumstances cancel or alter these orders without speaking with the primary team first. You may reach our pager [phone] at any time.   Ezequiel Essex, MD

## 2020-04-15 NOTE — Progress Notes (Signed)
After speaking with interventional radiology, it may be helpful to perform a CTA abdomen if Mr. Wimberly has not a strong demonstration of a bleeding episode.  We will plan to keep Mr. Jury in the hospital until at least tomorrow with plan to immediately perform a CTA abdomen if there is evidence of a large bloody bowel movement.  Matilde Haymaker, MD

## 2020-04-15 NOTE — Progress Notes (Signed)
Daily Rounding Note  04/15/2020, 11:18 AM  LOS: 0 days   SUBJECTIVE:   Chief complaint: Painless hematochezia.     Patient feels well.  No dizziness with standing or moving about the room. No BMs or passing of blood since yesterday morning, before he got to the ED.  Overall he is feeling well.  He is hungry  OBJECTIVE:         Vital signs in last 24 hours:    Temp:  [98.2 F (36.8 C)-99.1 F (37.3 C)] 98.4 F (36.9 C) (02/04 0520) Pulse Rate:  [70-80] 70 (02/04 0520) Resp:  [15-19] 18 (02/04 0520) BP: (108-130)/(64-81) 124/78 (02/04 0520) SpO2:  [94 %-100 %] 96 % (02/04 0520) Last BM Date: 04/14/20 There were no vitals filed for this visit. General: Obese.  Looks well Heart: RRR. Chest: No labored breathing or cough.  Lungs clear bilaterally Abdomen: Not tender, obese, soft, active bowel sounds. Extremities: No CCE Neuro/Psych: Does not, calm, fully alert and oriented.  No tremors or weakness.  Intake/Output from previous day: 02/03 0701 - 02/04 0700 In: 2149 [I.V.:800; Blood:1349] Out: 0   Intake/Output this shift: Total I/O In: 654.4 [P.O.:30; I.V.:624.4] Out: -   Lab Results: Recent Labs    04/14/20 1048 04/15/20 0032 04/15/20 0823  WBC 9.7 9.6 8.7  HGB 7.0* 7.0* 7.7*  HCT 22.0* 22.1* 23.9*  PLT 268 263 253   BMET Recent Labs    04/12/20 1557 04/14/20 1048 04/15/20 0823  NA 140 139 138  K 4.2 3.8 3.6  CL 104 107 107  CO2 21 23 23   GLUCOSE 115* 124* 96  BUN 17 17 14   CREATININE 1.20 1.24 1.14  CALCIUM 8.8 8.7* 8.5*   LFT Recent Labs    04/14/20 1048 04/15/20 0823  PROT 6.3* 6.0*  ALBUMIN 3.2* 3.0*  AST 20 18  ALT 20 19  ALKPHOS 56 55  BILITOT 0.7 1.0   PT/INR No results for input(s): LABPROT, INR in the last 72 hours. Hepatitis Panel No results for input(s): HEPBSAG, HCVAB, HEPAIGM, HEPBIGM in the last 72 hours.  Studies/Results: NM GI Blood Loss  Result Date:  04/14/2020 CLINICAL DATA:  Dark bloody stools for 5 days.  Anemia. EXAM: NUCLEAR MEDICINE GASTROINTESTINAL BLEEDING SCAN TECHNIQUE: Sequential abdominal images were obtained following intravenous administration of Tc-63m labeled red blood cells. RADIOPHARMACEUTICALS:  26 mCi Tc-54m pertechnetate in-vitro labeled red cells. COMPARISON:  04/10/2020 CT abdomen/pelvis FINDINGS: During the first hour of imaging, there is subtle curvilinear activity in the left lower abdomen, which does not appreciably traverse into other portions of the abdomen. This activity is less pronounced on the second hour of imaging. No additional sites of abnormal labeled red blood cell activity. IMPRESSION: Subtle curvilinear tagged red cell activity in the left lower abdomen on the first hour of imaging, which does not appreciably traverse into other portions of the abdomen, and which is less pronounced during the second hour of imaging. This location correlates with the sigmoid colon on the recent CT study and is suggestive of a subtle/slow sigmoid diverticular bleed. Electronically Signed   By: Ilona Sorrel M.D.   On: 04/14/2020 18:11    ASSESMENT:   *    Painless hematochezia, presumed diverticular bleed. Tagged RBC study showing subtle/slow bleeding activity in the sigmoid region.  *    ABL anemia. Hgb 6.5 >> 2 PRBC >> 7.7.   PLAN   *    Given rapid  recurrence of diverticular bleed.  Needs to be observed for 1 more night. Carb mod diet.   CBC in the morning. I discontinued the oral iron.  He can start that up as an outpatient once the acute bleeding issues have resolved.  Right now iron could create a confusing picture and that it causes dark stools.    Azucena Freed  04/15/2020, 11:18 AM Phone [phone]

## 2020-04-15 NOTE — Plan of Care (Signed)

## 2020-04-16 LAB — COMPREHENSIVE METABOLIC PANEL
ALT: 20 U/L (ref 0–44)
AST: 20 U/L (ref 15–41)
Albumin: 3.2 g/dL — ABNORMAL LOW (ref 3.5–5.0)
Alkaline Phosphatase: 56 U/L (ref 38–126)
Anion gap: 11 (ref 5–15)
BUN: 14 mg/dL (ref 8–23)
CO2: 21 mmol/L — ABNORMAL LOW (ref 22–32)
Calcium: 8.7 mg/dL — ABNORMAL LOW (ref 8.9–10.3)
Chloride: 107 mmol/L (ref 98–111)
Creatinine, Ser: 1.09 mg/dL (ref 0.61–1.24)
GFR, Estimated: >60 mL/min (ref >60)
Glucose, Bld: 99 mg/dL (ref 70–99)
Potassium: 3.6 mmol/L (ref 3.5–5.1)
Sodium: 139 mmol/L (ref 135–145)
Total Bilirubin: 1.4 mg/dL — ABNORMAL HIGH (ref 0.3–1.2)
Total Protein: 6.4 g/dL — ABNORMAL LOW (ref 6.5–8.1)

## 2020-04-16 LAB — CBC WITH DIFFERENTIAL/PLATELET
Abs Immature Granulocytes: 0.1 10*3/uL — ABNORMAL HIGH (ref 0.00–0.07)
Basophils Absolute: 0.1 10*3/uL (ref 0.0–0.1)
Basophils Relative: 1 %
Eosinophils Absolute: 0.3 10*3/uL (ref 0.0–0.5)
Eosinophils Relative: 4 %
HCT: 30.7 % — ABNORMAL LOW (ref 39.0–52.0)
Hemoglobin: 9.8 g/dL — ABNORMAL LOW (ref 13.0–17.0)
Immature Granulocytes: 1 %
Lymphocytes Relative: 32 %
Lymphs Abs: 2.7 10*3/uL (ref 0.7–4.0)
MCH: 27.8 pg (ref 26.0–34.0)
MCHC: 31.9 g/dL (ref 30.0–36.0)
MCV: 87.2 fL (ref 80.0–100.0)
Monocytes Absolute: 0.6 10*3/uL (ref 0.1–1.0)
Monocytes Relative: 7 %
Neutro Abs: 4.8 10*3/uL (ref 1.7–7.7)
Neutrophils Relative %: 55 %
Platelets: 278 10*3/uL (ref 150–400)
RBC: 3.52 MIL/uL — ABNORMAL LOW (ref 4.22–5.81)
RDW: 15.8 % — ABNORMAL HIGH (ref 11.5–15.5)
WBC: 8.5 10*3/uL (ref 4.0–10.5)
nRBC: 0 % (ref 0.0–0.2)

## 2020-04-16 NOTE — Progress Notes (Signed)
Pt reports having a bowel movement this pm with no bleeding noted. Stool not seen by staff

## 2020-04-16 NOTE — Progress Notes (Signed)
     [facility] Progress Note  CC:  GI bleed  Subjective:  Feels good.  No further red blood, just some darker colored stools.  They are going to stop his PO iron for now.   Objective:  Vital signs in last 24 hours: Temp:  [98.2 F (36.8 C)-98.7 F (37.1 C)] 98.2 F (36.8 C) (02/05 1441) Pulse Rate:  [70-87] 87 (02/05 1441) Resp:  [17-18] 17 (02/05 1441) BP: (122-145)/(79-101) 145/101 (02/05 1441) SpO2:  [94 %-100 %] 98 % (02/05 1441) Last BM Date: 04/15/20 General:  Alert, Well-developed, in NAD Heart:  Regular rate and rhythm; no murmurs Pulm:  CTA.  No W/R/R. Abdomen:  Soft, non-distended.  BS present.  Non-tender.   Extremities:  Without edema. Neurologic:  Alert and oriented x 4;  grossly normal neurologically. Psych:  Alert and cooperative. Normal mood and affect.  Intake/Output from previous day: 02/04 0701 - 02/05 0700 In: 2283.4 [P.O.:510; I.V.:624.4; Blood:1149] Out: -   Lab Results: Recent Labs    04/15/20 0032 04/15/20 0823 04/16/20 0653  WBC 9.6 8.7 8.5  HGB 7.0* 7.7* 9.8*  HCT 22.1* 23.9* 30.7*  PLT 263 253 278   BMET Recent Labs    04/14/20 1048 04/15/20 0823 04/16/20 0653  NA 139 138 139  K 3.8 3.6 3.6  CL 107 107 107  CO2 23 23 21*  GLUCOSE 124* 96 99  BUN 17 14 14   CREATININE 1.24 1.14 1.09  CALCIUM 8.7* 8.5* 8.7*   LFT Recent Labs    04/16/20 0653  PROT 6.4*  ALBUMIN 3.2*  AST 20  ALT 20  ALKPHOS 56  BILITOT 1.4*   NM GI Blood Loss  Result Date: 04/14/2020 CLINICAL DATA:  Dark bloody stools for 5 days.  Anemia. EXAM: NUCLEAR MEDICINE GASTROINTESTINAL BLEEDING SCAN TECHNIQUE: Sequential abdominal images were obtained following intravenous administration of Tc-54m labeled red blood cells. RADIOPHARMACEUTICALS:  26 mCi Tc-8m pertechnetate in-vitro labeled red cells. COMPARISON:  04/10/2020 CT abdomen/pelvis FINDINGS: During the first hour of imaging, there is subtle curvilinear activity in the left lower abdomen,  which does not appreciably traverse into other portions of the abdomen. This activity is less pronounced on the second hour of imaging. No additional sites of abnormal labeled red blood cell activity. IMPRESSION: Subtle curvilinear tagged red cell activity in the left lower abdomen on the first hour of imaging, which does not appreciably traverse into other portions of the abdomen, and which is less pronounced during the second hour of imaging. This location correlates with the sigmoid colon on the recent CT study and is suggestive of a subtle/slow sigmoid diverticular bleed. Electronically Signed   By: Ilona Sorrel M.D.   On: 04/14/2020 18:11   Assessment / Plan: *    Painless hematochezia, presumed diverticular bleed. Tagged RBC study showing subtle/slow bleeding activity in the sigmoid region.  *    ABL anemia. Hgb 6.5 >> 2 PRBC >> 9.8 grams after a total of 4 units PRBCs this hospitalization.  -CTA if he rebleeds.  Otherwise if hemoglobin is stable and he remains without overt bleeding and certainly is okay for discharge on 2/6 from a GI standpoint.    LOS: 1 day   Alex [name]  04/16/2020, 4:00 PM

## 2020-04-16 NOTE — Progress Notes (Addendum)
Family Medicine Teaching Service Daily Progress Note Intern Pager: [phone]  Patient name: Alex [name] Medical record number: [id number] Date of birth: [birth year] Age: 65 y.o. Gender: male  Primary Care Provider: Matilde Haymaker, MD Consultants: GI Code Status: Full   Pt Overview and Major Events to Date:  2/3 Admitted   Assessment and Plan: Lavontay Kirk Miltonis a 65 y.o.malepresenting with dark bloody stools. PMH is significant for prior history of diverticular GI bleed, diverticulosis, CAD, HTN, GERD and Type 2 DM.   Rectal bleeding GI consulted and recommended tagged RBC scan which demonstrated bleeding within the sigmoid colon, suggestive of diverticular bleed. S/p 4 pRBC at this time, repeat Hgb 9.8. Patient denies any bloody stools buy endorses dark stool, likely secondary to iron supplementation. Will continue to monitor for further bleeding at this time, consider CTA abdomen to better localized bleeding if appropriate. Hgb appropriate, will continue to monitor for symptoms.  -GI following, appreciate recommendations and continued involvement  -transfusion threshold 8 -continue to hold aspirin  -mIVF -IV protonix 40 mg -monitor CBC -symptomatic monitoring   Chest pain Resolved, patient complained of chest pain that is not associated with movement or exertion on admission but denying current pain. Low suspicion that this chest pain is of cardiac etiology but pending further work up. Given history of CAD, he is at risk for cardiac events secondary to anemia. Troponin 8. Likely due to demand ischemia.  -monitor   Diverticulosis Most recent colonoscopy on 11/2018 notable for diverticulosis in the sigmoid and descending colon with multiple polyps (tubular adenomas) and non-bleeding internal hemorrhoids. Believed to be the cause of the GI bleed during last hospital visit.  Normocytic anemia Hgb 9.8 this morning, stable from yesterday. History of iron deficiency anemia Home  medications include oral iron supplementation. -monitor CBC as appropriate   CAD Previous home medications include aspirin which patient was instructed not to take. Transfusion threshold is8.  -continue to hold aspirin due to potential GI bleed  Hypertension Currently normotensive at BP 139/88. Home medications include carvedilol 25 mg daily and irbesartan 300 mg daily, compliant on both.  -continue to hold home irbesartan given slight elevation in Cr -continue to hold coreg   Type 2 DM Most recent A1c 6.2 on 03/23/2020. Not on any home medications. -monitor blood glucose  GERD Home medications include omeprazole 40 mg daily. -hold home med -IV protonix 40 mg   OSA Report nightly CPAP. -CPAP nightly  Gout Home medications include allopurinol daily along with colchicine as needed for flare ups. No current indication of flare at this time, chronic and stable. -continue allopurinol  FEN/GI: carb modified diet  PPx: SCDs   Status is: Inpatient    Dispo: pending GI recs, possible discharge home 2/6     Subjective:  No acute overnight events reported. Patient denies hematuria, melena, dizziness and fatigue. Endorsing dark stools yesterday afternoon but denies any presence of blood or clots. Has no additional concerns at this time.   Objective: Temp:  [98.2 F (36.8 C)-98.7 F (37.1 C)] 98.2 F (36.8 C) (02/05 0502) Pulse Rate:  [70-88] 70 (02/05 0502) Resp:  [18] 18 (02/05 0502) BP: (122-143)/(79-95) 139/88 (02/05 0502) SpO2:  [94 %-100 %] 94 % (02/05 0502) Physical Exam: General: Patient sitting upright on the side of the bed eating breakfast, in no acute distress. Cardiovascular: RRR, no murmurs or gallops auscultated  Respiratory: lungs clear to auscultation bilaterally, breathing comfortably on room air Abdomen: soft, nontender, presence of active bowel sounds Extremities:  radial pulses strong and equal bilaterally, no LE edema noted bilaterally Derm:  skin warm and dry to touch, no rashes or lesions noted   Laboratory: Recent Labs  Lab 04/14/20 1048 04/15/20 0032 04/15/20 0823  WBC 9.7 9.6 8.7  HGB 7.0* 7.0* 7.7*  HCT 22.0* 22.1* 23.9*  PLT 268 263 253   Recent Labs  Lab 04/09/20 1827 04/10/20 0440 04/12/20 1557 04/14/20 1048 04/15/20 0823  NA 140   < > 140 139 138  K 4.1   < > 4.2 3.8 3.6  CL 105   < > 104 107 107  CO2 23   < > 21 23 23   BUN 18   < > 17 17 14   CREATININE 1.03   < > 1.20 1.24 1.14  CALCIUM 9.1   < > 8.8 8.7* 8.5*  PROT 7.3  --   --  6.3* 6.0*  BILITOT 0.7  --   --  0.7 1.0  ALKPHOS 71  --   --  56 55  ALT 31  --   --  20 19  AST 28  --   --  20 18  GLUCOSE 105*   < > 115* 124* 96   < > = values in this interval not displayed.      Imaging/Diagnostic Tests: No results found.  Donney Dice, DO 04/16/2020, 6:53 AM PGY-1, [place] Intern pager: [phone], text pages welcome

## 2020-04-17 DIAGNOSIS — K5791 Diverticulosis of intestine, part unspecified, without perforation or abscess with bleeding: Secondary | ICD-10-CM

## 2020-04-17 LAB — CBC WITH DIFFERENTIAL/PLATELET
Abs Immature Granulocytes: 0.06 10*3/uL (ref 0.00–0.07)
Basophils Absolute: 0.1 10*3/uL (ref 0.0–0.1)
Basophils Relative: 1 %
Eosinophils Absolute: 0.5 10*3/uL (ref 0.0–0.5)
Eosinophils Relative: 6 %
HCT: 29.7 % — ABNORMAL LOW (ref 39.0–52.0)
Hemoglobin: 9.5 g/dL — ABNORMAL LOW (ref 13.0–17.0)
Immature Granulocytes: 1 %
Lymphocytes Relative: 34 %
Lymphs Abs: 3.1 10*3/uL (ref 0.7–4.0)
MCH: 28.1 pg (ref 26.0–34.0)
MCHC: 32 g/dL (ref 30.0–36.0)
MCV: 87.9 fL (ref 80.0–100.0)
Monocytes Absolute: 0.7 10*3/uL (ref 0.1–1.0)
Monocytes Relative: 8 %
Neutro Abs: 4.6 10*3/uL (ref 1.7–7.7)
Neutrophils Relative %: 50 %
Platelets: 281 10*3/uL (ref 150–400)
RBC: 3.38 MIL/uL — ABNORMAL LOW (ref 4.22–5.81)
RDW: 15.9 % — ABNORMAL HIGH (ref 11.5–15.5)
WBC: 8.9 10*3/uL (ref 4.0–10.5)
nRBC: 0 % (ref 0.0–0.2)

## 2020-04-17 LAB — BPAM RBC
Blood Product Expiration Date: 202202142359
Blood Product Expiration Date: 202203012359
Blood Product Expiration Date: 202203012359
Blood Product Expiration Date: 202203032359
ISSUE DATE / TIME: 202202031819
ISSUE DATE / TIME: 202202040223
ISSUE DATE / TIME: 202202042218
ISSUE DATE / TIME: 202202050137
Unit Type and Rh: 6200
Unit Type and Rh: 6200
Unit Type and Rh: 6200
Unit Type and Rh: 6200

## 2020-04-17 LAB — TYPE AND SCREEN
ABO/RH(D): A POS
Antibody Screen: NEGATIVE
Unit division: 0
Unit division: 0
Unit division: 0
Unit division: 0

## 2020-04-17 LAB — COMPREHENSIVE METABOLIC PANEL
ALT: 20 U/L (ref 0–44)
AST: 20 U/L (ref 15–41)
Albumin: 3 g/dL — ABNORMAL LOW (ref 3.5–5.0)
Alkaline Phosphatase: 53 U/L (ref 38–126)
Anion gap: 12 (ref 5–15)
BUN: 13 mg/dL (ref 8–23)
CO2: 20 mmol/L — ABNORMAL LOW (ref 22–32)
Calcium: 8.7 mg/dL — ABNORMAL LOW (ref 8.9–10.3)
Chloride: 107 mmol/L (ref 98–111)
Creatinine, Ser: 1.19 mg/dL (ref 0.61–1.24)
GFR, Estimated: >60 mL/min (ref >60)
Glucose, Bld: 107 mg/dL — ABNORMAL HIGH (ref 70–99)
Potassium: 3.7 mmol/L (ref 3.5–5.1)
Sodium: 139 mmol/L (ref 135–145)
Total Bilirubin: 0.7 mg/dL (ref 0.3–1.2)
Total Protein: 6.3 g/dL — ABNORMAL LOW (ref 6.5–8.1)

## 2020-04-17 NOTE — Plan of Care (Signed)

## 2020-04-17 NOTE — Progress Notes (Signed)
AVS given and reviewed with pt. Medications discussed. All questions answered to satisfaction. Pt verbalized understanding of information given. Pt escorted off the unit with all belongings via wheelchair by staff member.  

## 2020-04-17 NOTE — Progress Notes (Signed)
Family Medicine Teaching Service Daily Progress Note Intern Pager: [phone]  Patient name: Alex [name] Medical record number: [id number] Date of birth: 10-03-1955 Age: 65 y.o. Gender: male  Primary Care Provider: Matilde Haymaker, MD Consultants: GI Code Status: Full   Pt Overview and Major Events to Date:  2/3 Admitted, transfused 1 unit PRBC 2/4 transfused 3 units PRBC  Assessment and Plan: Alex [name] a 65 y.o.malewith diverticular bleed that required transfusion. PMH is significant for prior history of diverticular GI bleed, diverticulosis, CAD, HTN, GERD and Type 2 DM.   Recurrent lower GI bleed, likely diverticular Hx of diverticulosis Tagged RBC scan suggestive of diverticular bleed in sigmoid colon. S/p 4 pRBC at this time, repeat Hgb 9.8>9.5. Due to dark stools, iron currently discontinued due to difficulty with differentiating true bleeding--reports no blood in stools. -GI following, appreciate recs -transfusion threshold 8 -continue to hold aspirin and iron -mIVF -IV protonix 40 mg -monitor CBC -symptomatic monitoring   Normocytic anemia Hgb 9.5 this morning, stable from yesterday. History of iron deficiency anemia Home medications include oral iron supplementation. -monitor CBC as appropriate  -holding iron supplementation due to dark stools confounding GI bleed monitoring  CAD Previous home medications include aspirin which patient was instructed not to take. Transfusion threshold is8.  -continue to hold aspirin due to GI bleed  Hypertension Systolic BP in the 662H consistently for the last 24 hours. Home medications include carvedilol 25 mg daily and irbesartan 300 mg daily, compliant on both.  -continue to hold home irbesartan given slight elevation in Cr -continue to hold coreg   Type 2 DM, stable Most recent A1c 6.2 on 03/23/2020. Not on any home medications. -monitor blood glucose  GERD, stable Home medications include omeprazole 40 mg  daily. -hold home med -IV protonix 40 mg   OSA, stable Report nightly CPAP. -CPAP nightly  Gout, stable Home medications include allopurinol daily along with colchicine PRN for flares. No current indication of flare at this time, chronic and stable. -continue allopurinol  Chest pain, resolved   FEN/GI: carb modified diet  PPx: SCDs   Status is: Inpatient Dispo: 2/6 home with close follow-up    Subjective:  No acute events overnight.  Patient denies further rectal bleeding and clots.  Does report that he still has some darkness to his stools from the iron, but states that it has lightened up since we discontinued it yesterday.  Patient feels like his blood pressures have been better since he has been in the hospital.  Patient is aware that he will need close follow-up upon discharge.  Denies headache, dizziness, weakness, fatigue.  Objective: Temp:  [97.7 F (36.5 C)-98.4 F (36.9 C)] 97.7 F (36.5 C) (02/06 0449) Pulse Rate:  [71-87] 71 (02/06 0449) Resp:  [16-18] 16 (02/06 0449) BP: (141-145)/(88-101) 145/95 (02/06 0449) SpO2:  [94 %-99 %] 94 % (02/06 0449) Physical Exam: General: NAD, sitting up in chair by bed, appears comfortable and in pleasant mood Cardiovascular: RRR, no M/R/G appreciated, 2+ peripheral pulses Respiratory: CTAB, normal WOB, comfortable on room air Abdomen: Soft, nontender, nondistended, bowel sounds present Extremities: Warm, well-perfused Derm: Skin is warm and dry  Laboratory: Recent Labs  Lab 04/15/20 0823 04/16/20 0653 04/17/20 0120  WBC 8.7 8.5 8.9  HGB 7.7* 9.8* 9.5*  HCT 23.9* 30.7* 29.7*  PLT 253 278 281   Recent Labs  Lab 04/15/20 0823 04/16/20 0653 04/17/20 0120  NA 138 139 139  K 3.6 3.6 3.7  CL 107 107 107  CO2 23 21* 20*  BUN 14 14 13   CREATININE 1.14 1.09 1.19  CALCIUM 8.5* 8.7* 8.7*  PROT 6.0* 6.4* 6.3*  BILITOT 1.0 1.4* 0.7  ALKPHOS 55 56 53  ALT 19 20 20   AST 18 20 20   GLUCOSE 96 99 107*       Imaging/Diagnostic Tests: No results found.  [name], DO 04/17/2020, 8:52 AM PGY-1, [place] Intern pager: [phone], text pages welcome

## 2020-04-17 NOTE — Discharge Instructions (Signed)
It was a great being a part of your care team during your stay! You were hospitalized due to a lower GI bleed that caused you to be severely anemic. During hospitalization, you were transfused 4 units of blood. You will have very close follow-up with the clinic for hemoglobin checks over the next several days. While you're hospitalized, your blood pressure medications were also not continued as you were having normal blood pressures. Today blood pressures have been mildly elevated, we will not continue these at the moment but it will be reevaluated by your PCP at follow-up appointments.  You have a follow-up appointment at the family medicine clinic tomorrow on 2/7 at 8:50 AM, please be sure to be 15 minutes early to your appointment.    https://www.ncbi.nlm.nih.gov/books/NBK537291/#:~:text=Interventional%20Radiology-,Deterrence%20and%20Patient%20Education,also%20known%20as%20the%20colon).">  Lower Gastrointestinal Bleeding  Lower gastrointestinal (GI) bleeding is the result of bleeding from the colon, the rectum, or the area of the opening of the buttocks (anal area). The colon is the last part of the digestive tract, where stool (feces) is formed. A person with lower GI bleeding may see blood in or on his or her stool. The blood may be bright red. Lower GI bleeding often stops without treatment. Continued or heavy bleeding may be life-threatening and needs emergency treatment at a hospital. What are the causes? This condition may be caused by:  Diverticulosis. This condition causes pouches to form in your colon over time.  Diverticulitis. This is inflammation in areas where diverticulosis has occurred.  Inflammatory bowel disease (IBD), or inflammation of the colon.  Hemorrhoids, or swollen veins in the rectum.  Anal fissures, or painful tears in the anus. Tears are often caused by passing hard stools.  Cancer of the colon or rectum, or polyps of the colon or rectum. Polyps are  noncancerous growths.  Coagulopathy. This is a disorder that makes it hard to form blood clots and causes easy bleeding.  Arteriovenous malformation. This is an abnormal weakening of a blood vessel where an artery and a vein come together. What increases the risk? The following factors may make you more likely to develop this condition:  Being older than age 48.  Regular use of aspirin or NSAIDs, such as ibuprofen.  Taking blood thinners (anticoagulants) or anti-platelet medicines.  Having a history of high-dose X-ray treatment (radiation therapy) of the colon.  Having recently had a colon polyp removed.  Heavy use of alcohol, or use of nicotine products. What are the signs or symptoms? Symptoms of this condition include:  Bright red blood or blood clots coming from your rectum.  Bloody stools.  Black or maroon-colored stools.  Pain or cramping in your abdomen.  Weakness or dizziness.  Racing heartbeat. How is this diagnosed? This condition may be diagnosed based on:  Your symptoms and medical history.  A physical exam. During the exam, your health care provider will check for signs of blood loss, such as low blood pressure and a fast pulse.  Tests or procedures. These may include: ? Flexible sigmoidoscopy. In this procedure, a flexible tube with a camera on the end is used to examine your anus and the first part of your colon to look for the source of bleeding. ? Colonoscopy. This is similar to a flexible sigmoidoscopy, but the camera can extend all the way to the uppermost part of your colon. ? Blood tests to measure your red blood cell count and to check for coagulopathy. ? Angiogram. For this test, X-rays of your colon are taken after a dye  or radioactive substance is injected into your bloodstream. This may be done to look for a bleeding site. How is this treated? Treatment for this condition depends on the cause of your bleeding. Heavy or ongoing bleeding is  treated at a hospital. Treatment may include:  Getting fluids through an IV inserted into one of your veins.  Getting blood through an IV (blood transfusion).  Stopping bleeding with high heat (coagulation), injections of certain medicines, or surgical clips. This can all be done during a colonoscopy.  Having a procedure that involves first doing an angiogram and then blocking blood flow to the bleeding site (embolization).  Stopping some of your regular medicines for a certain amount of time.  Having surgery to remove part of your colon. This may be needed if bleeding is severe and does not lessen after other treatment. Follow these instructions at home: Eating and drinking  Eat foods that are high in fiber. This will help keep your stools soft. These foods include whole grains, legumes, fruits, and vegetables. Eating 1-3 prunes a day works well for many people.  Drink enough fluid to keep your urine pale yellow.  Do not drink alcohol.   General instructions  Do not use any products that contain nicotine or tobacco, such as cigarettes, e-cigarettes, and chewing tobacco. If you need help quitting, ask your health care provider.  Take over-the-counter and prescription medicines only as told by your health care provider. You may need to avoid aspirin, NSAIDs, or other medicines that increase bleeding.  Return to your normal activities as told by your health care provider. Ask your health care provider what activities are safe for you.  Keep all follow-up visits as told by your health care provider. This is important.   Contact a health care provider if:  Your symptoms do not improve.  You feel nauseous, or you vomit.  You have pain in your abdomen.  You feel weak or dizzy.  You need help to stop smoking or drinking alcohol. Get help right away if:  Your bleeding increases.  You have severe weakness or you faint.  You have sudden or severe cramps in your back or  abdomen.  You vomit blood.  You pass large blood clots in your stool.  Any of your other symptoms get worse. These symptoms may represent a serious problem that is an emergency. Do not wait to see if the symptoms will go away. Get medical help right away. Call your local emergency services (911 in the [place] Do not drive yourself to the hospital. Summary  If you have lower gastrointestinal (GI) bleeding, you may see blood in or on your stool (feces). The blood may be bright red.  Take over-the-counter and prescription medicines only as told by your health care provider. You may need to avoid aspirin, NSAIDs, or other medicines that increase bleeding.  Keep all follow-up visits as told by your health care provider. This is important.  Get help right away if your symptoms get worse. This information is not intended to replace advice given to you by your health care provider. Make sure you discuss any questions you have with your health care provider. Document Revised: 02/21/2019 Document Reviewed: 02/21/2019 Elsevier Patient Education  2021 [employer].

## 2020-04-17 NOTE — Progress Notes (Signed)
   Subjective:   Patient ID: Alex [name]    DOB: 12/31/1955, 65 y.o. male   MRN: [id number]  Alex [name] is a 65 y.o. male with a history of CAD, diastolic HF (EF 94-07%), HTN, intermittent claudication, internal hemorrhoids, near syncope/postural dizziness, OSA, diverticulosis w/ h/o diverticulitis, esophageal stricture, GERD, h/o GI bleed, T2DM, lumbar DDD, osteoarthritis, bilateral renal cysts, gout, h/o TIA, h/o tobacco use, HLD, myalgias due to statin, obesity, vision changes, seasonal allergies  here for hospital follow up  Hospital Follow up 2/2 Recurrent Painless Hematochezia  History of Iron Deficiency Anemia  Patient hospitalized from 2/3 - 2/5? For recurrent lower GI bleed likely diverticular in origin given his history of diverticulitis and evidence on tagged RBC scan that suggested diverticular bleed in sigmoid colon. S/p 4 units pRBC.. Initial Hgb 6.5, Hgb at discharge 9.5 Discharged on 04/17/20. Iron supplementation was held until serial hemoglobins demonstrated stability. Denies any further bleeding. Stool have gotten lighter.  HTN:  BP: (!) 130/92 today. Repeat 141/94. Previously on carvedilol 25mg  daily and irbesartan 150mg  daily however these were held due to elevated creatinine. Previously on Amlodipine 5mg  however this was at some point discontinued. Former Smoker. Denies any chest pain, SOB, vision changes, or headaches.   Review of Systems:  Per HPI.   Objective:   BP (!) 130/92   Pulse 100   Wt (!) 324 lb 6.4 oz (147.1 kg)   SpO2 96%   BMI 50.81 kg/m  Vitals and nursing note reviewed.  General: pleasant overweight older male, sitting comfortably in exam chair, well nourished, well developed, in no acute distress with non-toxic appearance CV: regular rate and rhythm without murmurs, rubs, or gallops Lungs: clear to auscultation bilaterally with normal work of breathing on room air, speaking in full sentences Extremities: normal tone MSK: , gait normal Neuro:  Alert and oriented, speech normal  Assessment & Plan:   Hypertension associated with diabetes ([place]) Chronic. All antihypertensives stopped during hospitalization. BP mildly elevated on initial and repeat today. History of taking Irbesartan 150mg  QD (patient endorses intolerance to 300mg ), Carvedilol 25mg  QD, and Amlodipine 5mg  QD. Kidney function appears to be at baseline. - restart Irbesartan 75mg  QD - Follow up 04/22/20 for BP follow up and BMP - likely increase back up to Irbesartan 150mg  QD with plan to follow up with PCP in few weeks for continued monitoring.  History of GI diverticular bleed Resolved. POC Hgb 10 today.  - Follow up on 04/22/20 for repeat POC hgb  - If stable, will plan to restart oral iron supplementation - Follow up with GI scheduled for 05/25/20 - Strict return precautions discussed. Patient voiced understanding and agreement with plan.  Orders Placed This Encounter  Procedures  . CBC  . Hemoglobin   No orders of the defined types were placed in this encounter.   Alex Marble, DO PGY-3, [facility] 04/18/2020 10:16 AM

## 2020-04-17 NOTE — Discharge Summary (Signed)
[facility] Discharge Summary  Patient name: Alex [name] Medical record number: [id number] Date of birth: [birth year] Age: 65 y.o. Gender: male Date of Admission: 04/14/2020  Date of Discharge: 04/17/2020 Admitting Physician: Dickie La, MD  Primary Care Provider: Matilde Haymaker, MD Consultants: GI, IR  Indication for Hospitalization: Recurrent GI bleed  Discharge Diagnoses/Problem List:  Recurrent lower GI bleed, likely diverticular Diverticulosis Normocytic anemia CAD HTN T2DM GERD OSA Gout  Disposition: home  Discharge Condition: stable, improved  Discharge Exam:  General: NAD, sitting up in chair by bed, appears comfortable and in pleasant mood Cardiovascular: RRR, no M/R/G appreciated, 2+ peripheral pulses Respiratory: CTAB, normal WOB, comfortable on room air Abdomen: Soft, nontender, nondistended, bowel sounds present Extremities: Warm, well-perfused Derm: Skin is warm and dry  Brief Hospital Course:  Alex [name] is a 65 y.o. male presenting with dark bloody stools . PMH is significant for prior history of diverticular GI bleed, diverticulosis, CAD, HTN, GERD and Type 2 DM.   Rectal bleeding Patient recently discharged from the hospital for diverticular bleed. Presented to clinic for follow up with Hgb 6.8 with symptomatic anemia. Admitted with Hgb 7. Given 1 pRBC and still remained unchanged. GI consulted and recommended tagged RBC scan which demonstrated diverticular bleed. Per GI recommendations, IR consulted for mesenteric angiography and possible embolization but determined that the bleed has clinically stopped and further intervention not necessary. Patient was given total of 4U pRBC and Hgb on discharge was 9.5 with resolution of symptoms. Iron supplementation was discontinued in the hospital due to dark stools complicating clinical picture and monitoring.   Chest pain Endorsing chest pain on admission. Troponins wnl, likely due to  demand ischemia. Resolution of chest pain soon after admission and throughout remainder of hospitalization.   HTN Patient admitted to the hospital normotensive. BP medications of carvedilol 52m daily and irbesartan 3071mdaily were held due to slightly elevated creatinine as well as normotensive pressures. On day of discharge, patient was noted to have some elevations of systolic blood pressures in the 140s, medications were not restarted. Patient was advised to check pressures at home and will have close outpatient follow-up.    All other issues chronic and stable.   Issues for follow up: 1. Follow up PCP for Hgb check on 2/7, with daily Hgb checks for 3-5 days. 2. Iron supplementation stopped during hospital stay due to dark stools complicating clinical picture and monitoring. Consider restarting supplementation once patient hemoglobin remains stable. 3. F/u PCP for HTN, BP medications were held due to creatinine elevation and normotensive readings in the hospital. On day of discharge, readings were mildly elevated, recommend reassessment for restarting meds. 4. Check A1c to assess whether patient needs diabetic regimen initiated, currently he is not taking anything for his diabetes.    Significant Procedures: None  Significant Labs and Imaging:  Recent Labs  Lab 04/15/20 0823 04/16/20 0653 04/17/20 0120  WBC 8.7 8.5 8.9  HGB 7.7* 9.8* 9.5*  HCT 23.9* 30.7* 29.7*  PLT 253 278 281   Recent Labs  Lab 04/12/20 1557 04/14/20 1048 04/15/20 0823 04/16/20 0653 04/17/20 0120  NA 140 139 138 139 139  K 4.2 3.8 3.6 3.6 3.7  CL 104 107 107 107 107  CO2 _0 21* 20*  GLUCOSE 115* 124* 96 99 107*  BUN _1 CREATININE 1.20 1.24 1.14 1.09 1.19  CALCIUM 8.8 8.7* 8.5* 8.7* 8.7*  ALKPHOS  --  56  55 56 53  AST  --  _0 ALT  --  _1 ALBUMIN  --  3.2* 3.0* 3.2* 3.0*     Results/Tests Pending at Time of Discharge: None  Discharge Medications:   Allergies as of 04/17/2020      Reactions   Atorvastatin Other (See Comments)   Subjective myalgias. Gave 2 trials, developed myalgias which resolved after cessation of medication.  Muscle cramps were 9/10 discomfort   Codeine Nausea Only   Shellfish Allergy Other (See Comments)   Activates asthma      Medication List    STOP taking these medications   carvedilol 25 MG tablet Commonly known as: COREG   irbesartan 150 MG tablet Commonly known as: AVAPRO   Iron 325 (65 Fe) MG Tabs   naproxen 500 MG tablet Commonly known as: NAPROSYN     TAKE these medications   Accu-Chek Aviva Connect w/Device Kit 1 Device by Does not apply route daily.   acetaminophen 500 MG tablet Commonly known as: TYLENOL Take 500 mg by mouth every 6 (six) hours as needed for moderate pain.   allopurinol 100 MG tablet Commonly known as: ZYLOPRIM Take 1 tablet (100 mg total) by mouth daily.   colchicine 0.6 MG tablet Take 1 tablet (0.6 mg total) by mouth daily. What changed:   when to take this  reasons to take this   freestyle lancets Use as instructed   Accu-Chek FastClix Lancets Misc 1 Device by Does not apply route daily.   glucose blood test strip 1 each by Other route daily. Use as instructed   omeprazole 40 MG capsule Commonly known as: PRILOSEC TAKE 1 CAPSULE BY MOUTH ONCE DAILY WITH SUPPER. What changed: See the new instructions.   triamcinolone ointment 0.5 % Commonly known as: KENALOG Apply to affected area. What changed:   how much to take  how to take this  when to take this  reasons to take this  additional instructions       Discharge Instructions: Please refer to Patient Instructions section of EMR for full details.  Patient was counseled important signs and symptoms that should prompt return to medical care, changes in medications, dietary instructions, activity restrictions, and follow up appointments.   Follow-Up Appointments:  Follow-up Information     Matilde Haymaker, MD .   Specialty: [facility] Medicine Contact information: [place] [address] [phone]        Burnell Blanks, MD .   Specialty: Cardiology Contact information: [place]. [address] [place] [address] [phone]               [name], DO 04/17/2020, 11:39 AM PGY-1, [place]

## 2020-04-18 ENCOUNTER — Ambulatory Visit (INDEPENDENT_AMBULATORY_CARE_PROVIDER_SITE_OTHER): Payer: PPO | Admitting: Family Medicine

## 2020-04-18 ENCOUNTER — Telehealth: Payer: Self-pay

## 2020-04-18 ENCOUNTER — Other Ambulatory Visit: Payer: Self-pay

## 2020-04-18 VITALS — BP 130/92 | HR 100 | Wt 324.4 lb

## 2020-04-18 DIAGNOSIS — E1159 Type 2 diabetes mellitus with other circulatory complications: Secondary | ICD-10-CM | POA: Diagnosis not present

## 2020-04-18 DIAGNOSIS — D649 Anemia, unspecified: Secondary | ICD-10-CM

## 2020-04-18 DIAGNOSIS — Z8719 Personal history of other diseases of the digestive system: Secondary | ICD-10-CM

## 2020-04-18 DIAGNOSIS — I152 Hypertension secondary to endocrine disorders: Secondary | ICD-10-CM | POA: Diagnosis not present

## 2020-04-18 LAB — POCT HEMOGLOBIN: Hemoglobin: 10 g/dL — AB (ref 11–14.6)

## 2020-04-18 NOTE — Assessment & Plan Note (Signed)
Resolved. POC Hgb 10 today.  - Follow up on 04/22/20 for repeat POC hgb  - If stable, will plan to restart oral iron supplementation - Follow up with GI scheduled for 05/25/20 - Strict return precautions discussed. Patient voiced understanding and agreement with plan.

## 2020-04-18 NOTE — Telephone Encounter (Signed)
3/16 at 950 am appt with Dr Carlean Purl. The pt has been advised

## 2020-04-18 NOTE — Assessment & Plan Note (Signed)
Chronic. All antihypertensives stopped during hospitalization. BP mildly elevated on initial and repeat today. History of taking Irbesartan 150mg  QD (patient endorses intolerance to 300mg ), Carvedilol 25mg  QD, and Amlodipine 5mg  QD. Kidney function appears to be at baseline. - restart Irbesartan 75mg  QD - Follow up 04/22/20 for BP follow up and BMP - likely increase back up to Irbesartan 150mg  QD with plan to follow up with PCP in few weeks for continued monitoring.

## 2020-04-18 NOTE — Patient Instructions (Signed)
It was a pleasure to see you today!  Thank you for choosing [facility] for your primary care.   Our plans for today were:  Restart Irbesartan 75mg  (half of the 150mg ) daily  Follow up on 04/22/20 at 8:50am   Return sooner or go to the ED if you develop dizziness/lightheadedness when walking, bleeding, dark stools, chest pain, SOB    Best Wishes,   [employer], DO

## 2020-04-18 NOTE — Telephone Encounter (Signed)
-----   Message from Loralie Champagne, PA-C sent at 04/17/2020  3:43 PM EST ----- Patient needs office visit for hospital follow-up of diverticular bleeding.

## 2020-04-19 LAB — CBC
Hematocrit: 30.9 % — ABNORMAL LOW (ref 37.5–51.0)
Hemoglobin: 10.2 g/dL — ABNORMAL LOW (ref 13.0–17.7)
MCH: 27.9 pg (ref 26.6–33.0)
MCHC: 33 g/dL (ref 31.5–35.7)
MCV: 84 fL (ref 79–97)
Platelets: 347 10*3/uL (ref 150–450)
RBC: 3.66 x10E6/uL — ABNORMAL LOW (ref 4.14–5.80)
RDW: 14.5 % (ref 11.6–15.4)
WBC: 7.9 10*3/uL (ref 3.4–10.8)

## 2020-04-21 ENCOUNTER — Other Ambulatory Visit: Payer: Self-pay | Admitting: Family Medicine

## 2020-04-21 NOTE — Progress Notes (Signed)
   Subjective:   Patient ID: Alex [name]    DOB: 09/04/1955, 65 y.o. male   MRN: [id number]  Alex [name] is a 65 y.o. male with a history of CAD, diastolic HF (EF 89-16%), HTN, intermittent claudication, internal hemorrhoids, near syncope/postural dizziness, OSA, diverticulosis w/ h/o diverticulitis, esophageal stricture, GERD, h/o GI bleed, T2DM, lumbar DDD, osteoarthritis, bilateral renal cysts, gout, h/o TIA, h/o tobacco use, HLD, myalgias due to statin, obesity, vision changes, seasonal allergies here for anemia follow up  Anemia Follow Up  History of Diverticular bLeed:  Patient here for follow up after hospitalization for symptomatic anemia due to diverticular bleed. Patient denies any further bleeding. Denies any lightheadedness/dizziness, chest pain, SOB. Not currently on iron supplement. Follow up with Gi scheduled for 05/25/20.  HTN:  BP: 130/75 today. Repeat 139/86. Currently on Irbesartan 75mg  QD. Notes that he had a higher blood pressure in the 140's/100's last night so he took a a whole Irbesartan 150mg . History of taking Irbesartan 150mg  QD, Carvedilol 25mg  QD, and Amlodipine 5mg  QD. Endorses compliance. Former smoker. Denies any chest pain, SOB, vision changes, or headaches.   Lab Results  Component Value Date   CREATININE 1.19 04/17/2020   CREATININE 1.09 04/16/2020   CREATININE 1.14 04/15/2020   Review of Systems:  Per HPI.   Objective:   BP 130/75   Pulse 73   Ht 5\' 7"  (1.702 m)   Wt (!) 324 lb 12.8 oz (147.3 kg)   SpO2 97%   BMI 50.87 kg/m  Vitals and nursing note reviewed.  General: pleasant overweight older male, sitting comfortably in exam chair, well nourished, well developed, in no acute distress with non-toxic appearance Resp: breathing comfortably on room air, speaking in full sentences Skin: warm, dry, no rashes or lesions Extremities: warm and well perfused MSK: gait normal Neuro: Alert and oriented, speech normal  Assessment & Plan:    Hypertension associated with diabetes ([place]) Chronic. Slightly above goal. Just restarted full dose Irbesartan 150mg  QD last night. Will wait on further changes. BMP today. Follow up with PCP on 05/09/20 for BP check and further adjustments.   History of GI diverticular bleed Resolved. No further bleeding. Hgb stable at 9.9.  - follow up with GI on 05/25/20 - follow up on PCP on 05/09/20  Orders Placed This Encounter  Procedures  . Basic Metabolic Panel  . CBC  . Hemoglobin   No orders of the defined types were placed in this encounter.   Mina Marble, DO PGY-3, [facility] 04/22/2020 1:52 PM

## 2020-04-22 ENCOUNTER — Other Ambulatory Visit: Payer: Self-pay

## 2020-04-22 ENCOUNTER — Ambulatory Visit (INDEPENDENT_AMBULATORY_CARE_PROVIDER_SITE_OTHER): Payer: PPO | Admitting: Family Medicine

## 2020-04-22 VITALS — BP 130/75 | HR 73 | Ht 67.0 in | Wt 324.8 lb

## 2020-04-22 DIAGNOSIS — D649 Anemia, unspecified: Secondary | ICD-10-CM

## 2020-04-22 DIAGNOSIS — Z8719 Personal history of other diseases of the digestive system: Secondary | ICD-10-CM

## 2020-04-22 DIAGNOSIS — I152 Hypertension secondary to endocrine disorders: Secondary | ICD-10-CM | POA: Diagnosis not present

## 2020-04-22 DIAGNOSIS — E1159 Type 2 diabetes mellitus with other circulatory complications: Secondary | ICD-10-CM

## 2020-04-22 LAB — POCT HEMOGLOBIN: Hemoglobin: 9.9 g/dL — AB (ref 11–14.6)

## 2020-04-22 NOTE — Assessment & Plan Note (Deleted)
Resolved. No further bleeding. Hgb stable at 9.9.  - follow up with GI on 05/25/20 - follow up on PCP on 05/09/20

## 2020-04-22 NOTE — Patient Instructions (Signed)
It was a pleasure to see you today!  Thank you for choosing [facility] for your primary care.   Our plans for today were:  Continue the Irbesartan 150mg  daily for your blood pressure.  I am getting labs today to monitor your kidney function. I will call you if abnormal  Your  Hemoglobin is 9.3. I am getting follow up labs to confirm this.  Please restart your iron supplement  Please follow up with Dr. Pilar Plate on 2/28 for blood pressure follow up and monitoring of your hemoglobin  Follow up with GI as scheduled  Please be sure to return or report to ED if you develop red or dark stools, lightheadedness/dizziness, or severe abdominal pain   Best Wishes,   Mina Marble, DO

## 2020-04-22 NOTE — Assessment & Plan Note (Signed)
Resolved. No further bleeding. Hgb stable at 9.9.  - follow up with GI on 05/25/20 - follow up on PCP on 05/09/20

## 2020-04-22 NOTE — Assessment & Plan Note (Signed)
Chronic. Slightly above goal. Just restarted full dose Irbesartan 150mg  QD last night. Will wait on further changes. BMP today. Follow up with PCP on 05/09/20 for BP check and further adjustments.

## 2020-04-23 LAB — CBC
Hematocrit: 31.6 % — ABNORMAL LOW (ref 37.5–51.0)
Hemoglobin: 10.2 g/dL — ABNORMAL LOW (ref 13.0–17.7)
MCH: 27.6 pg (ref 26.6–33.0)
MCHC: 32.3 g/dL (ref 31.5–35.7)
MCV: 86 fL (ref 79–97)
Platelets: 337 10*3/uL (ref 150–450)
RBC: 3.69 x10E6/uL — ABNORMAL LOW (ref 4.14–5.80)
RDW: 14.8 % (ref 11.6–15.4)
WBC: 7.3 10*3/uL (ref 3.4–10.8)

## 2020-04-23 LAB — BASIC METABOLIC PANEL
BUN/Creatinine Ratio: 16 (ref 10–24)
BUN: 18 mg/dL (ref 8–27)
CO2: 19 mmol/L — ABNORMAL LOW (ref 20–29)
Calcium: 8.8 mg/dL (ref 8.6–10.2)
Chloride: 104 mmol/L (ref 96–106)
Creatinine, Ser: 1.12 mg/dL (ref 0.76–1.27)
GFR calc Af Amer: 80 mL/min/{1.73_m2} (ref >59)
GFR calc non Af Amer: 69 mL/min/{1.73_m2} (ref >59)
Glucose: 88 mg/dL (ref 65–99)
Potassium: 4.3 mmol/L (ref 3.5–5.2)
Sodium: 139 mmol/L (ref 134–144)

## 2020-04-24 ENCOUNTER — Encounter: Payer: Self-pay | Admitting: Family Medicine

## 2020-04-27 ENCOUNTER — Telehealth: Payer: Self-pay

## 2020-04-27 NOTE — Telephone Encounter (Signed)
Patient calls nurse line regarding elevated BP readings over the last two days. Patient reports that readings have been 456'Y-563'S systolic with diastolic readings in the 937D. Most recent BP was 169/110. Patient is currently asymptomatic and endorses full medication compliance.   Precepted with Dr. Erin Hearing. Advised that patient come into the office tomorrow 2/17, for BP check. Scheduled in nurse clinic for BP check. Instructed patient to bring medications and home BP cuff to check for correlation with office BP.   Strict return/ED precautions given.   Talbot Grumbling, RN

## 2020-04-28 ENCOUNTER — Other Ambulatory Visit: Payer: Self-pay

## 2020-04-28 ENCOUNTER — Ambulatory Visit (INDEPENDENT_AMBULATORY_CARE_PROVIDER_SITE_OTHER): Payer: HMO

## 2020-04-28 VITALS — BP 156/90 | HR 57

## 2020-04-28 DIAGNOSIS — Z0130 Encounter for examination of blood pressure without abnormal findings: Secondary | ICD-10-CM

## 2020-04-28 MED ORDER — AMLODIPINE BESYLATE 5 MG PO TABS: 5.0000 mg | ORAL_TABLET | Freq: Every day | ORAL | 0 refills | Status: DC

## 2020-04-28 NOTE — Progress Notes (Signed)
Patient presents to nurse clinic for BP check per Dr. Erin Hearing.   0936: BP: 156/90, HR: 57, SpO2 99% 0940: checked BP with patient's home cuff: 157/97  Patient reports that he has been having slight headaches, otherwise, asymptomatic.   Patient endorses medication compliance with carvedilol and irbesartan.   Spoke with Dr. Erin Hearing regarding patient. Provider recommends restarting amlodipine at 5 mg.   Patient is requesting rx be sent to [facility] on [address].   Strict return/ED precautions given.   Forwarding to PCP and Dr. Erin Hearing.

## 2020-04-28 NOTE — Telephone Encounter (Signed)
BP was elevated on nurse visit today. Will send in amlodipine 5 mg daily Follow up with pcp

## 2020-05-09 ENCOUNTER — Encounter: Payer: Self-pay | Admitting: Family Medicine

## 2020-05-09 ENCOUNTER — Ambulatory Visit (INDEPENDENT_AMBULATORY_CARE_PROVIDER_SITE_OTHER): Payer: HMO | Admitting: Family Medicine

## 2020-05-09 ENCOUNTER — Other Ambulatory Visit: Payer: Self-pay

## 2020-05-09 VITALS — BP 132/86 | HR 78 | Ht 67.0 in | Wt 324.1 lb

## 2020-05-09 DIAGNOSIS — E1159 Type 2 diabetes mellitus with other circulatory complications: Secondary | ICD-10-CM | POA: Diagnosis not present

## 2020-05-09 DIAGNOSIS — E785 Hyperlipidemia, unspecified: Secondary | ICD-10-CM | POA: Diagnosis not present

## 2020-05-09 DIAGNOSIS — Z23 Encounter for immunization: Secondary | ICD-10-CM | POA: Diagnosis not present

## 2020-05-09 DIAGNOSIS — I152 Hypertension secondary to endocrine disorders: Secondary | ICD-10-CM

## 2020-05-09 DIAGNOSIS — I5032 Chronic diastolic (congestive) heart failure: Secondary | ICD-10-CM | POA: Diagnosis not present

## 2020-05-09 MED ORDER — EZETIMIBE 10 MG PO TABS: 10.0000 mg | ORAL_TABLET | Freq: Every day | ORAL | 3 refills | Status: DC

## 2020-05-09 MED ORDER — AMLODIPINE BESYLATE 10 MG PO TABS: 10.0000 mg | ORAL_TABLET | Freq: Every day | ORAL | 0 refills | Status: DC

## 2020-05-09 NOTE — Progress Notes (Signed)
    SUBJECTIVE:   CHIEF COMPLAINT / HPI:   Hypertension His current antihypertensive regimen includes: -Amlodipine 5 mg daily -Carvedilol 25 mg daily No difficulty with lightheadedness or dizziness when moving from a seated to standing position.  Hyperlipidemia He has had a previous myocardial infarction.  He is previously been prescribed statin medication.  He reports that he has attempted to use at least 3 different statin medications which have been discontinued due to myositis.  He is not currently taking any cholesterol medications.  His last LDL is 186.  PERTINENT  PMH / PSH: CAD, s/p MI, diabetes, diastolic heart failure  OBJECTIVE:   BP 132/86   Pulse 78   Ht 5\' 7"  (1.702 m)   Wt (!) 324 lb 2 oz (147 kg)   SpO2 98%   BMI 50.77 kg/m    General: Alert and cooperative and appears to be in no acute distress Cardio: Normal S1 and S2, no S3 or S4. Rhythm is regular. No murmurs or rubs.   Pulm: Clear to auscultation bilaterally, no crackles, wheezing, or diminished breath sounds. Normal respiratory effort Abdomen: Bowel sounds normal. Abdomen soft and non-tender.  Extremities: No peripheral edema. Warm/ well perfused.  Strong radial pulses. Neuro: Cranial nerves grossly intact  ASSESSMENT/PLAN:   Hypertension associated with diabetes ([place]) Due to his heart failure (preserved ejection fraction) and diabetes, I would like him to be on an ACE inhibitor or ARB.  We discussed this at some length.  He had a cough with an ACE inhibitor.  He notes that he was unable to tolerate an ARB.  He does not remember why he was intolerant of an ARB but he is not willing to try this medication again.  Chart review shows use previously prescribed irbesartan and losartan.  There is no clear adverse effects noted with these medications.  His goal blood pressure is below 130/80. for now, I will increase his amlodipine from 5 to 10 mg. -Increase amlodipine to 10 mg daily  Diastolic heart failure We  discussed that he may benefit from an SGLT2 inhibitor or GLP-1 medication in the future.  HLD (hyperlipidemia) Previous TIA and MI.  Last LDL 186.  Per patient report, he has failed 3 statin medications.  He is amenable to starting Zetia today. -Start Zetia 10 mg daily -Return to clinic in 6-8 weeks to recheck cholesterol -Consider referral to lipid clinic if cholesterol is not reduced by 50%      Matilde Haymaker, MD [place]

## 2020-05-09 NOTE — Assessment & Plan Note (Addendum)
Due to his heart failure (preserved ejection fraction) and diabetes, I would like him to be on an ACE inhibitor or ARB.  We discussed this at some length.  He had a cough with an ACE inhibitor.  He notes that he was unable to tolerate an ARB.  He does not remember why he was intolerant of an ARB but he is not willing to try this medication again.  Chart review shows use previously prescribed irbesartan and losartan.  There is no clear adverse effects noted with these medications.  His goal blood pressure is below 130/80. for now, I will increase his amlodipine from 5 to 10 mg. -Increase amlodipine to 10 mg daily

## 2020-05-09 NOTE — Assessment & Plan Note (Signed)
Previous TIA and MI.  Last LDL 186.  Per patient report, he has failed 3 statin medications.  He is amenable to starting Zetia today. -Start Zetia 10 mg daily -Return to clinic in 6-8 weeks to recheck cholesterol -Consider referral to lipid clinic if cholesterol is not reduced by 50%

## 2020-05-09 NOTE — Assessment & Plan Note (Signed)
We discussed that he may benefit from an SGLT2 inhibitor or GLP-1 medication in the future.

## 2020-05-09 NOTE — Patient Instructions (Signed)
It was great to see you today.  Here is a quick review of the things we talked about:   Blood pressure: Your blood pressure is good today but I would like to reduce your blood pressure little bit more.  I like you to return to amlodipine 10 mg daily.  Cholesterol: I am concerned about your elevated cholesterol and your past history of heart attack.  At like to start a medication today called Zetia which will help lower your cholesterol.  We will need to recheck your cholesterol level in 6 weeks.  It is very likely that your cholesterol will not be significantly lowered and we will have to move on to "bigger guns"

## 2020-05-11 ENCOUNTER — Telehealth: Payer: Self-pay | Admitting: Family Medicine

## 2020-05-25 ENCOUNTER — Ambulatory Visit: Payer: PPO | Admitting: Internal Medicine

## 2020-05-25 ENCOUNTER — Telehealth: Payer: Self-pay

## 2020-05-25 NOTE — Telephone Encounter (Signed)
No show letter mailed to patient. 

## 2020-06-01 ENCOUNTER — Ambulatory Visit: Payer: HMO | Admitting: Internal Medicine

## 2020-06-04 ENCOUNTER — Other Ambulatory Visit: Payer: Self-pay | Admitting: Family Medicine

## 2020-06-06 NOTE — Addendum Note (Signed)
Addended by: Salvatore Marvel on: 06/06/2020 12:29 PM   Modules accepted: Orders

## 2020-06-30 ENCOUNTER — Other Ambulatory Visit: Payer: Self-pay | Admitting: Family Medicine

## 2020-06-30 DIAGNOSIS — D509 Iron deficiency anemia, unspecified: Secondary | ICD-10-CM

## 2020-07-14 ENCOUNTER — Other Ambulatory Visit: Payer: Self-pay | Admitting: Family Medicine

## 2020-07-27 ENCOUNTER — Other Ambulatory Visit: Payer: Self-pay | Admitting: Family Medicine

## 2020-08-01 ENCOUNTER — Ambulatory Visit (INDEPENDENT_AMBULATORY_CARE_PROVIDER_SITE_OTHER): Payer: HMO | Admitting: Family Medicine

## 2020-08-01 ENCOUNTER — Other Ambulatory Visit: Payer: Self-pay

## 2020-08-01 ENCOUNTER — Encounter: Payer: Self-pay | Admitting: Family Medicine

## 2020-08-01 VITALS — BP 140/80 | HR 61 | Ht 67.0 in | Wt 318.0 lb

## 2020-08-01 DIAGNOSIS — E1159 Type 2 diabetes mellitus with other circulatory complications: Secondary | ICD-10-CM | POA: Diagnosis not present

## 2020-08-01 DIAGNOSIS — E119 Type 2 diabetes mellitus without complications: Secondary | ICD-10-CM | POA: Diagnosis not present

## 2020-08-01 DIAGNOSIS — E785 Hyperlipidemia, unspecified: Secondary | ICD-10-CM

## 2020-08-01 DIAGNOSIS — D649 Anemia, unspecified: Secondary | ICD-10-CM | POA: Diagnosis not present

## 2020-08-01 DIAGNOSIS — I152 Hypertension secondary to endocrine disorders: Secondary | ICD-10-CM

## 2020-08-01 LAB — POCT GLYCOSYLATED HEMOGLOBIN (HGB A1C): HbA1c, POC (controlled diabetic range): 6.5 % (ref 0.0–7.0)

## 2020-08-01 LAB — POCT HEMOGLOBIN: Hemoglobin: 13.3 g/dL (ref 11–14.6)

## 2020-08-01 NOTE — Assessment & Plan Note (Signed)
Reasonably well-controlled today although due to his history of heart failure, he would benefit from a blood pressure of under 130/80.  There seems to be some confusion about his antihypertensive regimen.  For now, would like him to continue his amlodipine, carvedilol and irbesartan.  We will check his renal function today and if he appears to be tolerating this medication well, I will have him increase his irbesartan to 300 mg daily. -Follow-up BMP -Consider irbesartan increased from 150 mg to 300 mg daily

## 2020-08-01 NOTE — Progress Notes (Signed)
    SUBJECTIVE:   CHIEF COMPLAINT / HPI:   Hypertension His current antihypertensive medications include: -Amlodipine 10 mg daily -Carvedilol 25 mg daily -Irbesartan 150 mg daily According to my last note and after visit summary, we discussed his history with ACE inhibitors and ARB's but decided not to start either of these at his last visit.  He reports that he was told to start his irbesartan at some point between his last visit and today.  He has been taking irbesartan 150 mg for several weeks now.  Diabetes His current medications include: -No blood glucose lowering agents -No statins -Currently taking irbesartan as above  Lipids He has previously demonstrated intolerance to statin medication.  We did attempt to start Zetia at his last visit.  He reports that he is not able to tolerate although he cannot remember the specific side effects. -not currently taking zetia   PERTINENT  PMH / PSH: Diabetes, hypertension, hyperlipidemia  OBJECTIVE:   BP 140/80   Pulse 61   Ht 5\' 7"  (1.702 m)   Wt (!) 318 lb (144.2 kg)   SpO2 98%   BMI 49.81 kg/m    General: Alert and cooperative and appears to be in no acute distress Cardio: Normal S1 and S2, no S3 or S4. Rhythm is regular. No murmurs or rubs.   Pulm: Clear to auscultation bilaterally, no crackles, wheezing, or diminished breath sounds. Normal respiratory effort Abdomen: Bowel sounds normal. Abdomen soft and non-tender.  Extremities: No peripheral edema. Warm/ well perfused.  Strong radial pulses. Neuro: Cranial nerves grossly intact  ASSESSMENT/PLAN:   HLD (hyperlipidemia) He reports that he has been unable to tolerate statins or Zetia.  Sadly, he is not able to properly recall his reaction to Zetia but reports that he cannot continue taking the medication.  At this point, I will refer him to a lipid clinic for additional options. -Referral placed to lipid clinic  Diabetes type 2, controlled Well-controlled.  No changes  at this time.  Hypertension Reasonably well-controlled today although due to his history of heart failure, he would benefit from a blood pressure of under 130/80.  There seems to be some confusion about his antihypertensive regimen.  For now, would like him to continue his amlodipine, carvedilol and irbesartan.  We will check his renal function today and if he appears to be tolerating this medication well, I will have him increase his irbesartan to 300 mg daily. -Follow-up BMP -Consider irbesartan increased from 150 mg to 300 mg daily     Matilde Haymaker, MD [place]

## 2020-08-01 NOTE — Assessment & Plan Note (Signed)
>>  ASSESSMENT AND PLAN FOR DIABETES TYPE 2, CONTROLLED WRITTEN ON 08/01/2020  1:31 PM BY Matilde Haymaker, MD  Well-controlled.  No changes at this time.

## 2020-08-01 NOTE — Patient Instructions (Signed)
It was great to see you today.  Here is a quick review of the things we talked about:   Hypertension: Your blood pressure is little higher than I would like today.  We are going to check your blood work to make sure your kidneys are working well.  If your kidneys look good, we will increase your medication.  Diabetes: Your diabetes is technically well-controlled today.  I recommend that you continue to focus on nutrition and exercise.  Cholesterol: Started you are not able to tolerate any of the standard cholesterol medication.  I have placed a referral to the lipid clinic. you should get a call in the next 1-2 weeks to set up your appointment.  Please let me know if you have not received a call in the next 2 weeks.

## 2020-08-01 NOTE — Assessment & Plan Note (Signed)
He reports that he has been unable to tolerate statins or Zetia.  Sadly, he is not able to properly recall his reaction to Zetia but reports that he cannot continue taking the medication.  At this point, I will refer him to a lipid clinic for additional options. -Referral placed to lipid clinic

## 2020-08-01 NOTE — Assessment & Plan Note (Signed)
Well controlled. No changes at this time

## 2020-08-02 ENCOUNTER — Encounter: Payer: Self-pay | Admitting: Family Medicine

## 2020-08-02 LAB — BASIC METABOLIC PANEL
BUN/Creatinine Ratio: 15 (ref 10–24)
BUN: 16 mg/dL (ref 8–27)
CO2: 20 mmol/L (ref 20–29)
Calcium: 9.3 mg/dL (ref 8.6–10.2)
Chloride: 105 mmol/L (ref 96–106)
Creatinine, Ser: 1.05 mg/dL (ref 0.76–1.27)
Glucose: 110 mg/dL — ABNORMAL HIGH (ref 65–99)
Potassium: 4.1 mmol/L (ref 3.5–5.2)
Sodium: 142 mmol/L (ref 134–144)
eGFR: 79 mL/min/{1.73_m2} (ref >59)

## 2020-08-10 ENCOUNTER — Other Ambulatory Visit: Payer: Self-pay | Admitting: Family Medicine

## 2020-08-22 ENCOUNTER — Other Ambulatory Visit: Payer: Self-pay

## 2020-08-22 DIAGNOSIS — D509 Iron deficiency anemia, unspecified: Secondary | ICD-10-CM

## 2020-08-22 MED ORDER — IRON 325 (65 FE) MG PO TABS
1.0000 | ORAL_TABLET | Freq: Every morning | ORAL | 0 refills | Status: DC
Start: 2020-08-22 — End: 2020-08-24

## 2020-08-24 ENCOUNTER — Other Ambulatory Visit: Payer: Self-pay | Admitting: Family Medicine

## 2020-08-24 DIAGNOSIS — D509 Iron deficiency anemia, unspecified: Secondary | ICD-10-CM

## 2020-08-29 ENCOUNTER — Other Ambulatory Visit: Payer: Self-pay | Admitting: Family Medicine

## 2020-08-29 DIAGNOSIS — D509 Iron deficiency anemia, unspecified: Secondary | ICD-10-CM

## 2020-08-31 ENCOUNTER — Ambulatory Visit (INDEPENDENT_AMBULATORY_CARE_PROVIDER_SITE_OTHER): Payer: HMO | Admitting: Pharmacist Clinician (PhC)/ Clinical Pharmacy Specialist

## 2020-08-31 ENCOUNTER — Other Ambulatory Visit: Payer: Self-pay

## 2020-08-31 VITALS — BP 138/88 | HR 60 | Resp 15 | Ht 67.0 in | Wt 320.6 lb

## 2020-08-31 DIAGNOSIS — E785 Hyperlipidemia, unspecified: Secondary | ICD-10-CM | POA: Diagnosis not present

## 2020-08-31 DIAGNOSIS — E7800 Pure hypercholesterolemia, unspecified: Secondary | ICD-10-CM | POA: Diagnosis not present

## 2020-08-31 DIAGNOSIS — I2510 Atherosclerotic heart disease of native coronary artery without angina pectoris: Secondary | ICD-10-CM | POA: Diagnosis not present

## 2020-08-31 DIAGNOSIS — G720 Drug-induced myopathy: Secondary | ICD-10-CM | POA: Diagnosis not present

## 2020-08-31 DIAGNOSIS — T466X5A Adverse effect of antihyperlipidemic and antiarteriosclerotic drugs, initial encounter: Secondary | ICD-10-CM

## 2020-08-31 DIAGNOSIS — I152 Hypertension secondary to endocrine disorders: Secondary | ICD-10-CM

## 2020-08-31 DIAGNOSIS — T466X5D Adverse effect of antihyperlipidemic and antiarteriosclerotic drugs, subsequent encounter: Secondary | ICD-10-CM

## 2020-08-31 DIAGNOSIS — E1159 Type 2 diabetes mellitus with other circulatory complications: Secondary | ICD-10-CM

## 2020-08-31 MED ORDER — IRBESARTAN 150 MG PO TABS: 150.0000 mg | ORAL_TABLET | Freq: Every day | ORAL | 1 refills | Status: DC

## 2020-08-31 NOTE — Progress Notes (Signed)
09/05/2020 Alex [name] 1955/10/09 [id number]   HPI:  Alex [name] is a 65 y.o. male patient of Dr Angelena Form, who presents today for a lipid clinic evaluation.  See pertinent past medical history below.  He was seen last fall by [facility] for lipid management, at which time he was re-challenged with pravastatin, as he did not recall what his previous reaction was.  After a short time on the pravastatin the myalgias returned and he had to discontinue medication.    Today he is in the office to discuss non-statin options.  His most recent cholesterol labs were drawn in January and showed his LDL up to 186.    Past Medical History: CHF 2019 EF 19-37%, grade 1 diastolic dysfunction  CAD 2002 cath - 25% distal RCA stenosis; prior TIA (12/2011)  DM2 5/22 A1c 6.5 - diet controlled  Hypertension Near goal - on carvedilol, amlodipine  obesity BMI 49.8  OSA     Current Medications: none  Cholesterol Goals: LDL < 55   Intolerant/previously tried: pravastatin, simvastatin, atorvastatin, rosuvastatin - all caused myalgias  Family history: mother deceased, MI and CHF, father still living  Exercise:  no regular exercise  Labs: 1/22:  TC 253, TG 179, HDL 33, LDL 186   Current Outpatient Medications  Medication Sig Dispense Refill   ACCU-CHEK FASTCLIX LANCETS MISC 1 Device by Does not apply route daily. 102 each 3   acetaminophen (TYLENOL) 500 MG tablet Take 500 mg by mouth every 6 (six) hours as needed for moderate pain.     allopurinol (ZYLOPRIM) 100 MG tablet Take 1 tablet (100 mg total) by mouth daily. 60 tablet 3   amLODipine (NORVASC) 10 MG tablet Take 1 tablet (10 mg total) by mouth daily. 60 tablet 0   Blood Glucose Monitoring Suppl (ACCU-CHEK AVIVA CONNECT) W/DEVICE KIT 1 Device by Does not apply route daily. 1 kit 0   carvedilol (COREG) 25 MG tablet TAKE 1 TABLET BY MOUTH ONCE DAILY WITH SUPPER 90 tablet 3   colchicine 0.6 MG tablet Take 1 tablet (0.6 mg total) by mouth daily. (Patient  taking differently: Take 0.6 mg by mouth daily as needed (gout).) 6 tablet 0   Ferrous Sulfate (IRON) 325 (65 Fe) MG TABS TAKE 1 TABLET BY MOUTH EVERY MORNING 90 tablet 0   glucose blood test strip 1 each by Other route daily. Use as instructed 100 each 12   irbesartan (AVAPRO) 150 MG tablet Take 1 tablet (150 mg total) by mouth daily. 90 tablet 1   naproxen (NAPROSYN) 500 MG tablet Take 500 mg by mouth 2 (two) times daily with a meal.     omeprazole (PRILOSEC) 40 MG capsule TAKE 1 CAPSULE BY MOUTH ONCE DAILY WITH SUPPER. (Patient taking differently: Take 40 mg by mouth every evening.) 90 capsule 3   triamcinolone ointment (KENALOG) 0.5 % Apply to affected area. (Patient taking differently: Apply 1 application topically as needed (to affected area(s)).) 15 g 0   No current facility-administered medications for this visit.    Allergies  Allergen Reactions   Atorvastatin Other (See Comments)    Subjective myalgias. Gave 2 trials, developed myalgias which resolved after cessation of medication.  Muscle cramps were 9/10 discomfort   Codeine Nausea Only   Crestor [Rosuvastatin]     myalgias   Shellfish Allergy Other (See Comments)    Activates asthma    Past Medical History:  Diagnosis Date   Asthma    CAD (coronary artery disease)  Cardiac cath 2002 with 25% distal RCA stenosis.    DDD (degenerative disc disease), lumbar    Diabetes mellitus ([place])    Diverticulitis    Esophageal stricture    Gastritis 2010   GERD (gastroesophageal reflux disease)    GI bleed    Gout    HTN (hypertension)    Hyperlipidemia    Insomnia    MI (myocardial infarction) ([place]) 2009   no stent placement   OA (osteoarthritis)    Obesity    OSA on CPAP    Seasonal allergies    TIA (transient ischemic attack) 2013    Blood pressure 138/88, pulse 60, resp. rate 15, height 5' 7" (1.702 m), weight (!) 320 lb 9.6 oz (145.4 kg), SpO2 97 %.   HLD (hyperlipidemia) Patient with prior TIA and LDL at 186.   He has not tolerated multiple statin drugs.  Reviewed options for lowering LDL cholesterol, including ezetimibe, PCSK-9 inhibitors, bempedoic acid and inclisiran.  Discussed mechanisms of action, dosing, side effects and potential decreases in LDL cholesterol.  Answered all patient questions.  Based on this information, patient would prefer to start bempedoic acid.  He does have a history of gout, and currently takes allopurinol 100 mg daily.  We reviewed potential for increase in uric acid levels and will have this checked in about 2 weeks.  If he is tolerating the medication at that time, will send in the authorization request for Nexlizet, as it will most likely take a combination of meds to get him at LDL goal.  Patient is adamant about avoiding injections, but if his LDL does not come down significantly with Nexlizet, may later see if he would be more willing to consider inclisiran.     Tommy Medal PharmD CPP [facility] [address] [place], [place] [address] [phone]

## 2020-08-31 NOTE — Patient Instructions (Addendum)
Your Results:             Your most recent labs Goal  Total Cholesterol 253 < 200  Triglycerides 179 < 150  HDL (happy/good cholesterol) 33 > 40  LDL (lousy/bad cholesterol 186 < 70     Medication changes:  We will try Nexletol 180 mg once daily.  After 2 weeks on medication please stop by the office to have blood work done.  We will start a Prior authorization to get this covered by your insurance.  If you tolerate this well, we will add ezetimibe (Zetia) to it and give you a combination tablet called Nexlizet   Patient Assistance:  The [employer] foundation offers assistance to help pay for medication copays.  They will cover copays for all cholesterol lowering meds, including statins, fibrates, omega-3 oils, ezetimibe, Repatha, Praluent, Nexletol, Nexlizet.  The cards are usually good for $2,500 or 12 months, whichever comes first. Go to healthwellfoundation.org Click on "Apply Now" Answer questions as to whom is applying (patient or representative) Your disease fund will be "hypercholesterolemia - Medicare access" They will ask questions about finances and which medications you are taking for cholesterol When you submit, the approval is usually within minutes.  You will need to print the card information from the site You will need to show this information to your pharmacy, they will bill your Medicare Part D plan first -then bill [employer] Well --for the copay.   You can also call them at [phone], although the hold times can be quite long.   Thank you for choosing [facility]

## 2020-08-31 NOTE — Progress Notes (Signed)
Heart and Vascular Care Navigation  08/31/2020  Alex [name] [birth year] [id number]  Reason for Referral:    Engaged with patient face to face for initial visit for Heart and Vascular Care Coordination.                                                                                                   Assessment:                    LCSW spoke with pt during appt with pharmacy team at [facility]. Pt only receives social security income- I provided him with information about Medicaid, as well as SHIIP line. Encouraged him to first see if he is eligible for Medicaid which may help with any ongoing medication/copay assistance. If he is not eligible then he should reach out to [facility] counselor to see if he can be assisted w/ completing the Extra Help/LIS application at that time.                    HRT/VAS Care Coordination     Patients Home Cardiology Office [place]   Outpatient Care Team Social Worker   Social Worker Name: Westley Hummer, LCSW, [place] arrangements for the past 2 months Single Family Home   Lives with: Self   Patient Current Insurance Coverage Managed Medicare   Patient Has Concern With Paying Medical Bills No   Does Patient Have Prescription Coverage? Yes   Patient Prescription Assistance Programs Other   Other Assistance Programs Medications provided pt with Medicaid information and SHIIP to see if eligible for Extra Help program   Home Assistive Devices/Equipment None       Social History:                                                                             SDOH Screenings   Alcohol Screen: Not on file  Depression (PHQ2-9): Low Risk    PHQ-2 Score: 0  Financial Resource Strain: Not on file  Food Insecurity: Not on file  Housing: Not on file  Physical Activity: Not on file  Social Connections: Not on file  Stress: Not on file  Tobacco Use: Medium Risk   Smoking Tobacco Use: Former   Smokeless Tobacco Use: Never   Transportation Needs: Not on file    SDOH Interventions: Financial Resources:    DSS for financial assistance    Other Care Navigation Interventions:     Provided Pharmacy assistance resources Other- extra help SHIIP information and Medicaid information provided   Follow-up plan:   LCSW provided pt with information regarding the above potential assistance programs. I shared that if he runs into any issues to give me a call/text and provided him with my name and number. I remain available  as needed.

## 2020-09-05 ENCOUNTER — Encounter: Payer: Self-pay | Admitting: Pharmacist Clinician (PhC)/ Clinical Pharmacy Specialist

## 2020-09-05 NOTE — Assessment & Plan Note (Signed)
Patient with prior TIA and LDL at 186.  He has not tolerated multiple statin drugs.  Reviewed options for lowering LDL cholesterol, including ezetimibe, PCSK-9 inhibitors, bempedoic acid and inclisiran.  Discussed mechanisms of action, dosing, side effects and potential decreases in LDL cholesterol.  Answered all patient questions.  Based on this information, patient would prefer to start bempedoic acid.  He does have a history of gout, and currently takes allopurinol 100 mg daily.  We reviewed potential for increase in uric acid levels and will have this checked in about 2 weeks.  If he is tolerating the medication at that time, will send in the authorization request for Nexlizet, as it will most likely take a combination of meds to get him at LDL goal.  Patient is adamant about avoiding injections, but if his LDL does not come down significantly with Nexlizet, may later see if he would be more willing to consider inclisiran.

## 2020-09-07 ENCOUNTER — Other Ambulatory Visit: Payer: Self-pay

## 2020-09-07 MED ORDER — ALLOPURINOL 100 MG PO TABS: 100.0000 mg | ORAL_TABLET | Freq: Every day | ORAL | 3 refills | Status: DC

## 2020-09-08 ENCOUNTER — Telehealth: Payer: Self-pay | Admitting: Licensed Clinical Social Worker

## 2020-09-08 NOTE — Telephone Encounter (Signed)
LCSW attempted to reach pt to f/u on information provided during appt w/ pharmacy. No answer, left message for pt at [phone] should pt need any additional clarification or assistance.   Westley Hummer, MSW, [place]  [phone]

## 2020-09-14 LAB — BASIC METABOLIC PANEL
BUN/Creatinine Ratio: 26 — ABNORMAL HIGH (ref 10–24)
BUN: 31 mg/dL — ABNORMAL HIGH (ref 8–27)
CO2: 21 mmol/L (ref 20–29)
Calcium: 9.5 mg/dL (ref 8.6–10.2)
Chloride: 103 mmol/L (ref 96–106)
Creatinine, Ser: 1.21 mg/dL (ref 0.76–1.27)
Glucose: 109 mg/dL — ABNORMAL HIGH (ref 65–99)
Potassium: 4.4 mmol/L (ref 3.5–5.2)
Sodium: 140 mmol/L (ref 134–144)
eGFR: 66 mL/min/{1.73_m2} (ref >59)

## 2020-09-14 LAB — URIC ACID: Uric Acid: 8 mg/dL (ref 3.8–8.4)

## 2020-09-15 ENCOUNTER — Telehealth: Payer: Self-pay

## 2020-09-15 NOTE — Telephone Encounter (Signed)
Pt called and stated that they believe that they had a reaction to the nexletol: hives/welts. None has been sent to pharmacy yet but he got some while in office. Will route to [employer] as she saw this pt.

## 2020-09-21 NOTE — Telephone Encounter (Signed)
Called patient - he reports having hives when taking Nexlizet.  Patient is adamant about not taking injectable medications - either PCSK-9 or inclisiran.  He states will work on diet/lifestyle modifications to lower cholesterol.

## 2020-10-12 ENCOUNTER — Other Ambulatory Visit: Payer: Self-pay | Admitting: Family Medicine

## 2020-11-09 ENCOUNTER — Other Ambulatory Visit: Payer: Self-pay

## 2020-11-10 ENCOUNTER — Telehealth: Payer: Self-pay

## 2020-11-10 MED ORDER — COLCHICINE 0.6 MG PO TABS: 0.6000 mg | ORAL_TABLET | Freq: Every day | ORAL | 0 refills | Status: DC

## 2020-11-10 NOTE — Telephone Encounter (Signed)
Pharmacy LVM on nurse line reporting drug to drug interaction between Carvedilol and Colchicine. Pharmacy reports "severe toxicity" when taken together. Pharmacist is requesting a verbal ok before he fill Colchicine. Please advise.

## 2020-11-10 NOTE — Telephone Encounter (Signed)
Discussed with Dr. Owens Shark and Dr. Valentina Lucks regarding possible interactions. Will continue with the PRN colchicine, to be advised that there can be increased GI side effects with the medications being taken together. Risks weighed and okay to fill prescription. Pharmacy notified.  Velia Pamer, DO

## 2020-11-23 ENCOUNTER — Other Ambulatory Visit: Payer: Self-pay | Admitting: Family Medicine

## 2021-01-02 ENCOUNTER — Other Ambulatory Visit: Payer: Self-pay | Admitting: Family Medicine

## 2021-01-09 ENCOUNTER — Other Ambulatory Visit: Payer: Self-pay | Admitting: Cardiovascular Disease

## 2021-01-09 ENCOUNTER — Other Ambulatory Visit: Payer: Self-pay | Admitting: Family Medicine

## 2021-01-24 ENCOUNTER — Other Ambulatory Visit: Payer: Self-pay

## 2021-01-24 ENCOUNTER — Encounter: Payer: Self-pay | Admitting: Family Medicine

## 2021-01-24 ENCOUNTER — Ambulatory Visit (INDEPENDENT_AMBULATORY_CARE_PROVIDER_SITE_OTHER): Payer: HMO | Admitting: Family Medicine

## 2021-01-24 VITALS — BP 144/95 | HR 68 | Ht 67.0 in | Wt 322.0 lb

## 2021-01-24 DIAGNOSIS — I10 Essential (primary) hypertension: Secondary | ICD-10-CM | POA: Diagnosis not present

## 2021-01-24 DIAGNOSIS — Z8719 Personal history of other diseases of the digestive system: Secondary | ICD-10-CM

## 2021-01-24 DIAGNOSIS — E119 Type 2 diabetes mellitus without complications: Secondary | ICD-10-CM

## 2021-01-24 DIAGNOSIS — E785 Hyperlipidemia, unspecified: Secondary | ICD-10-CM

## 2021-01-24 LAB — POCT GLYCOSYLATED HEMOGLOBIN (HGB A1C): HbA1c, POC (controlled diabetic range): 6.2 % (ref 0.0–7.0)

## 2021-01-24 NOTE — Assessment & Plan Note (Signed)
History of TIA, has not tolerated multiple statin medications.  Checking cholesterol today.  Patient previously following with a pharmacist who was discussing information about PCSK9 inhibitors, bempedoic acid and inclisiran. Patient will need to follow-up with specialty pharmacist if elevated.

## 2021-01-24 NOTE — Assessment & Plan Note (Addendum)
BP in clinic 144/95.  Home medications are taken at night and patient is compliant.  Discussed with patient about checking blood pressures at home, likely elevated in the setting that patient was recently walking from the restroom prior to blood pressure being taken. -Continue home regimen -Continue to monitor at home periodically -Urine microalbumin/creatinine ratio today

## 2021-01-24 NOTE — Assessment & Plan Note (Signed)
>>  ASSESSMENT AND PLAN FOR DIABETES TYPE 2, CONTROLLED WRITTEN ON 01/24/2021  5:03 PM BY LILLAND, ALANA, DO  A1c today 6.2.  Discussed with patient that that he does not necessarily need to be doing glucose checks as he is well controlled without medication.  Patient also desires weight loss, did counsel on talking with a nutritionist-may consider starting a GLP-1 or SGLT2 at the next visit. -Nutrition referral placed and patient given Dr. Jenne Campus information -Urine microalbumin/creatinine ratio today

## 2021-01-24 NOTE — Assessment & Plan Note (Addendum)
A1c today 6.2.  Discussed with patient that that he does not necessarily need to be doing glucose checks as he is well controlled without medication.  Patient also desires weight loss, did counsel on talking with a nutritionist-may consider starting a GLP-1 or SGLT2 at the next visit. -Nutrition referral placed and patient given Dr. Jenne Campus information -Urine microalbumin/creatinine ratio today

## 2021-01-24 NOTE — Patient Instructions (Addendum)
Your A1c today was 6.2, which is great (your goal is <7). We are going to check some other labs today that will tell us your cholesterol and how your kidney is functioning.   I am also sending in a referral to a nutritionist, it will be up to you to call and schedule with her.   We do have other medication options that may help with weight loss and your diabetes as well, and I recommend you come back for another visit if it is something you would like to discuss further.

## 2021-01-24 NOTE — Progress Notes (Signed)
    SUBJECTIVE:   CHIEF COMPLAINT / HPI:   T2DM Follow-up Patient reports that he is out of his lancets for checking his sugars at home. He is not currently on any medications and does not have any complications at this time. He overall feels pretty well, though would like to lose weight.   HTN follow-up BP at home 130s/80s.  He takes his home medications at night.  He is compliant with his amlodipine 10 mg, irbesartan 150 mg, carvedilol 25 mg.  PERTINENT  PMH / PSH: Reviewed  OBJECTIVE:   BP (!) 144/95   Pulse 68   Ht 5\' 7"  (1.702 m)   Wt (!) 322 lb (146.1 kg)   SpO2 99%   BMI 50.43 kg/m   Gen: well-appearing, NAD CV: RRR, no m/r/g appreciated, no peripheral edema Pulm: CTAB, no wheezes/crackles GI: soft, non-tender, non-distended  ASSESSMENT/PLAN:   Hypertension BP in clinic 144/95.  Home medications are taken at night and patient is compliant.  Discussed with patient about checking blood pressures at home, likely elevated in the setting that patient was recently walking from the restroom prior to blood pressure being taken. -Continue home regimen -Continue to monitor at home periodically -Urine microalbumin/creatinine ratio today  Diabetes type 2, controlled A1c today 6.2.  Discussed with patient that that he does not necessarily need to be doing glucose checks as he is well controlled without medication.  Patient also desires weight loss, did counsel on talking with a nutritionist-may consider starting a GLP-1 or SGLT2 at the next visit. -Nutrition referral placed and patient given Dr. Jenne Campus information -Urine microalbumin/creatinine ratio today  HLD (hyperlipidemia) History of TIA, has not tolerated multiple statin medications.  Checking cholesterol today.  Patient previously following with a pharmacist who was discussing information about PCSK9 inhibitors, bempedoic acid and inclisiran. Patient will need to follow-up with specialty pharmacist if elevated.   History  of GI bleed with symptomatic anemia Patient with history of symptomatic anemia from a GI bleed, currently on iron supplementation.  We will check ferritin and CBC today.   Alex [name], Alex [name]

## 2021-01-25 LAB — FERRITIN: Ferritin: 372 ng/mL (ref 30–400)

## 2021-01-25 LAB — BASIC METABOLIC PANEL
BUN/Creatinine Ratio: 14 (ref 10–24)
BUN: 15 mg/dL (ref 8–27)
CO2: 23 mmol/L (ref 20–29)
Calcium: 9.9 mg/dL (ref 8.6–10.2)
Chloride: 97 mmol/L (ref 96–106)
Creatinine, Ser: 1.04 mg/dL (ref 0.76–1.27)
Glucose: 88 mg/dL (ref 70–99)
Potassium: 4.4 mmol/L (ref 3.5–5.2)
Sodium: 137 mmol/L (ref 134–144)
eGFR: 80 mL/min/{1.73_m2} (ref >59)

## 2021-01-25 LAB — MICROALBUMIN / CREATININE URINE RATIO
Creatinine, Urine: 67.1 mg/dL
Microalb/Creat Ratio: 76 mg/g creat — ABNORMAL HIGH (ref 0–29)
Microalbumin, Urine: 50.7 ug/mL

## 2021-01-25 LAB — CBC
Hematocrit: 40.7 % (ref 37.5–51.0)
Hemoglobin: 13.2 g/dL (ref 13.0–17.7)
MCH: 25.9 pg — ABNORMAL LOW (ref 26.6–33.0)
MCHC: 32.4 g/dL (ref 31.5–35.7)
MCV: 80 fL (ref 79–97)
Platelets: 367 10*3/uL (ref 150–450)
RBC: 5.09 x10E6/uL (ref 4.14–5.80)
RDW: 14.6 % (ref 11.6–15.4)
WBC: 7.6 10*3/uL (ref 3.4–10.8)

## 2021-01-25 LAB — LIPID PANEL
Chol/HDL Ratio: 7.8 ratio — ABNORMAL HIGH (ref 0.0–5.0)
Cholesterol, Total: 233 mg/dL — ABNORMAL HIGH (ref 100–199)
HDL: 30 mg/dL — ABNORMAL LOW (ref >39)
LDL Chol Calc (NIH): 159 mg/dL — ABNORMAL HIGH (ref 0–99)
Triglycerides: 237 mg/dL — ABNORMAL HIGH (ref 0–149)
VLDL Cholesterol Cal: 44 mg/dL — ABNORMAL HIGH (ref 5–40)

## 2021-02-08 LAB — HM DIABETES EYE EXAM

## 2021-02-21 ENCOUNTER — Other Ambulatory Visit: Payer: Self-pay | Admitting: Family Medicine

## 2021-02-27 ENCOUNTER — Other Ambulatory Visit: Payer: Self-pay

## 2021-02-27 MED ORDER — ALLOPURINOL 100 MG PO TABS
100.0000 mg | ORAL_TABLET | Freq: Every day | ORAL | 3 refills | Status: DC
Start: 2021-02-27 — End: 2022-06-11

## 2021-04-18 ENCOUNTER — Other Ambulatory Visit: Payer: Self-pay | Admitting: Cardiovascular Disease

## 2021-05-10 DIAGNOSIS — Z79899 Other long term (current) drug therapy: Secondary | ICD-10-CM | POA: Diagnosis not present

## 2021-05-10 DIAGNOSIS — Z87891 Personal history of nicotine dependence: Secondary | ICD-10-CM | POA: Diagnosis not present

## 2021-05-10 DIAGNOSIS — Z23 Encounter for immunization: Secondary | ICD-10-CM | POA: Diagnosis not present

## 2021-05-10 DIAGNOSIS — J309 Allergic rhinitis, unspecified: Secondary | ICD-10-CM | POA: Diagnosis not present

## 2021-05-10 DIAGNOSIS — E119 Type 2 diabetes mellitus without complications: Secondary | ICD-10-CM | POA: Diagnosis not present

## 2021-05-10 DIAGNOSIS — Z1159 Encounter for screening for other viral diseases: Secondary | ICD-10-CM | POA: Diagnosis not present

## 2021-05-10 DIAGNOSIS — I110 Hypertensive heart disease with heart failure: Secondary | ICD-10-CM | POA: Diagnosis not present

## 2021-05-10 DIAGNOSIS — M109 Gout, unspecified: Secondary | ICD-10-CM | POA: Diagnosis not present

## 2021-05-10 DIAGNOSIS — I5032 Chronic diastolic (congestive) heart failure: Secondary | ICD-10-CM | POA: Diagnosis not present

## 2021-05-10 DIAGNOSIS — G4733 Obstructive sleep apnea (adult) (pediatric): Secondary | ICD-10-CM | POA: Diagnosis not present

## 2021-05-10 DIAGNOSIS — I700 Atherosclerosis of aorta: Secondary | ICD-10-CM | POA: Diagnosis not present

## 2021-05-10 DIAGNOSIS — K219 Gastro-esophageal reflux disease without esophagitis: Secondary | ICD-10-CM | POA: Diagnosis not present

## 2021-05-17 ENCOUNTER — Other Ambulatory Visit: Payer: Self-pay | Admitting: Cardiovascular Disease

## 2021-05-17 DIAGNOSIS — E1121 Type 2 diabetes mellitus with diabetic nephropathy: Secondary | ICD-10-CM | POA: Diagnosis not present

## 2021-05-17 DIAGNOSIS — Z0000 Encounter for general adult medical examination without abnormal findings: Secondary | ICD-10-CM | POA: Diagnosis not present

## 2021-05-17 DIAGNOSIS — J309 Allergic rhinitis, unspecified: Secondary | ICD-10-CM | POA: Diagnosis not present

## 2021-05-17 DIAGNOSIS — E1151 Type 2 diabetes mellitus with diabetic peripheral angiopathy without gangrene: Secondary | ICD-10-CM | POA: Diagnosis not present

## 2021-05-17 DIAGNOSIS — E119 Type 2 diabetes mellitus without complications: Secondary | ICD-10-CM | POA: Diagnosis not present

## 2021-05-17 DIAGNOSIS — I5032 Chronic diastolic (congestive) heart failure: Secondary | ICD-10-CM | POA: Diagnosis not present

## 2021-05-17 DIAGNOSIS — Z23 Encounter for immunization: Secondary | ICD-10-CM | POA: Diagnosis not present

## 2021-05-17 DIAGNOSIS — I2510 Atherosclerotic heart disease of native coronary artery without angina pectoris: Secondary | ICD-10-CM | POA: Diagnosis not present

## 2021-05-17 DIAGNOSIS — Z0001 Encounter for general adult medical examination with abnormal findings: Secondary | ICD-10-CM | POA: Diagnosis not present

## 2021-05-17 DIAGNOSIS — G4733 Obstructive sleep apnea (adult) (pediatric): Secondary | ICD-10-CM | POA: Diagnosis not present

## 2021-05-18 NOTE — Progress Notes (Unsigned)
Cardiology Office Note   Date:  05/18/2021   ID:  Alex [name], DOB 1955-05-18, MRN [id number]  PCP:  [name], DO  Cardiologist:  Kathyrn Drown, NP   No chief complaint on file.     History of Present Illness: Alex [name] is a 66 y.o. male who presents for overdue one year follow up, seen for Dr. Angelena Form.   Alex [name] has a hx of CAD, chronic diastolic CHF, DM, GERD, gout, HTN, HLD, obesity, sleep apnea, and prior TIA who was referred to Dr. Angelena Form 11/2019 to re-establish cardiac care. He was initially seen as a new patient in our office in 2013 however until last visit, he had not been seen since 2016. Cardiac cath in 2002 showed 25% stenosis in the distal RCA. He was admitted to [facility] 02/2008 with chest pain and ruled out for an MI with serial cardiac enzymes. Stress myoview 02/2008 with ejection fraction of 48%, with mild apical thinning, but no ischemia or scar. There was left ventricular enlargement. Echo in 2013 showed normal LV size and function with LVEF of 55-65% and moderate LVH. There were no significant valvular issues. Lexiscan myoview on 11/15/11 with no inducible ischemia. He was admitted to [facility] 12/24/11 with TIA type symptoms. Echo 08/2017 showed normal LV size and function with LVEF at 50-55%. No valve issues. He had dizziness in 09/2019 and was seen in primary care but there was no orthostatic and no anemia with a normal EKG.   He was last seen by Dr. Angelena Form 12/07/19 and was continued on ASA, beta blocker . He had stopped his statin due to muscle pain. He denied Repatha however was interest ed in referral for lipid clinic.   Most recent lipid panel shows CHO 233, HLD 30, Trig 237, LDL 159. It does not appear that he is on any lipid lowering agents at this time.   Today           1.CAD without angina: He had mild disease by cath 2002. No chest pain. Continue  ASA, beta blocker. He does not tolerate statins. See below.    2.  Hyperlipidemia: He has  stopped his statin due to muscle aches. He had trouble with Lipitor and Crestor. He is not willing to consider Repatha. Given known CAD, will repeat lipid profile now and refer to the lipid clinic to review options.   3. HTN: BP is well controlled. No changes   4. OSA: He wears CPAP        Past Medical History:  Diagnosis Date   Asthma    CAD (coronary artery disease)    Cardiac cath 2002 with 25% distal RCA stenosis.    DDD (degenerative disc disease), lumbar    Diabetes mellitus ([place])    Diverticulitis    Esophageal stricture    Gastritis 2010   GERD (gastroesophageal reflux disease)    GI bleed    Gout    HTN (hypertension)    Hyperlipidemia    Insomnia    MI (myocardial infarction) ([place]) 2009   no stent placement   OA (osteoarthritis)    Obesity    OSA on CPAP    Seasonal allergies    TIA (transient ischemic attack) 2013    Past Surgical History:  Procedure Laterality Date   COLONOSCOPY WITH PROPOFOL N/A 11/11/2018   Procedure: COLONOSCOPY WITH PROPOFOL;  Surgeon: Thornton Park, MD;  Location: [facility];  Service: Gastroenterology;  Laterality: N/A;   ESOPHAGOGASTRODUODENOSCOPY  multiple   NASAL SINUS SURGERY     POLYPECTOMY  11/11/2018   Procedure: POLYPECTOMY;  Surgeon: Thornton Park, MD;  Location: [facility] ENDOSCOPY;  Service: Gastroenterology;;   VASECTOMY       Current Outpatient Medications  Medication Sig Dispense Refill   ACCU-CHEK FASTCLIX LANCETS MISC 1 Device by Does not apply route daily. 102 each 3   acetaminophen (TYLENOL) 500 MG tablet Take 500 mg by mouth every 6 (six) hours as needed for moderate pain.     allopurinol (ZYLOPRIM) 100 MG tablet Take 1 tablet (100 mg total) by mouth daily. 60 tablet 3   amLODipine (NORVASC) 10 MG tablet TAKE 1 TABLET BY MOUTH ONCE DAILY WITH SUPPER 90 tablet 0   Blood Glucose Monitoring Suppl (ACCU-CHEK AVIVA CONNECT) W/DEVICE KIT 1 Device by Does not apply route daily. 1 kit 0   carvedilol (COREG) 25 MG  tablet TAKE 1 TABLET BY MOUTH ONCE DAILY WITH SUPPER 90 tablet 3   colchicine 0.6 MG tablet Take 1 tablet (0.6 mg total) by mouth daily. 6 tablet 0   Ferrous Sulfate (IRON) 325 (65 Fe) MG TABS TAKE 1 TABLET BY MOUTH EVERY MORNING 90 tablet 0   glucose blood test strip 1 each by Other route daily. Use as instructed 100 each 12   irbesartan (AVAPRO) 150 MG tablet Take 1 tablet (150 mg total) by mouth daily. Please make overdue appt with Dr. Angelena Form before anymore refills  Thank you 3rd and Final attempt 15 tablet 0   naproxen (NAPROSYN) 500 MG tablet TAKE 1 TABLET BY MOUTH ONCE DAILY AS NEEDED FOR MODERATE PAIN 30 tablet 0   omeprazole (PRILOSEC) 40 MG capsule TAKE 1 CAPSULE BY MOUTH ONCE DAILY WITH SUPPER 90 capsule 0   triamcinolone ointment (KENALOG) 0.5 % APPLY OINTMENT TOPICALLY TO AFFECTED AREA AS NEEDED 15 g 0   No current facility-administered medications for this visit.    Allergies:   Atorvastatin, Codeine, Crestor [rosuvastatin], and Shellfish allergy    Social History:  The patient  reports that he quit smoking about 29 years ago. His smoking use included cigarettes. He has a 15.00 pack-year smoking history. He has never used smokeless tobacco. He reports that he does not drink alcohol and does not use drugs.   Family History:  The patient's ***family history includes Heart attack in his mother; Heart failure in his mother.    ROS:  Please see the history of present illness.   Otherwise, review of systems are positive for {NONE DEFAULTED:18576}.   All other systems are reviewed and negative.    PHYSICAL EXAM: VS:  There were no vitals taken for this visit. , BMI There is no height or weight on file to calculate BMI. GEN: Well nourished, well developed, in no acute distress HEENT: normal Neck: no JVD, carotid bruits, or masses Cardiac: ***RRR; no murmurs, rubs, or gallops,no edema  Respiratory:  clear to auscultation bilaterally, normal work of breathing GI: soft, nontender,  nondistended, + BS MS: no deformity or atrophy Skin: warm and dry, no rash Neuro:  Strength and sensation are intact Psych: euthymic mood, full affect   EKG:  EKG {ACTION; IS/IS OBS:96283662} ordered today. The ekg ordered today demonstrates ***   Recent Labs: 01/24/2021: BUN 15; Creatinine, Ser 1.04; Hemoglobin 13.2; Platelets 367; Potassium 4.4; Sodium 137    Lipid Panel    Component Value Date/Time   CHOL 233 (H) 01/24/2021 1518   TRIG 237 (H) 01/24/2021 1518   HDL 30 (L) 01/24/2021  1518   CHOLHDL 7.8 (H) 01/24/2021 1518   CHOLHDL 8.4 09/06/2017 0512   VLDL 39 09/06/2017 0512   LDLCALC 159 (H) 01/24/2021 1518   LDLDIRECT 130 (H) 02/22/2012 0910      Wt Readings from Last 3 Encounters:  01/24/21 (!) 322 lb (146.1 kg)  08/31/20 (!) 320 lb 9.6 oz (145.4 kg)  08/01/20 (!) 318 lb (144.2 kg)      Other studies Reviewed: Additional studies/ records that were reviewed today include: ***. Review of the above records demonstrates: ***  Echo 08/2017:  - Left ventricle: The cavity size was normal. Systolic function was    at the lower limits of normal. The estimated ejection fraction    was in the range of 50% to 55%. Wall motion was normal; there    were no regional wall motion abnormalities. Doppler parameters    are consistent with abnormal left ventricular relaxation (grade 1    diastolic dysfunction).  - Left atrium: The atrium was mildly dilated.     ASSESSMENT AND PLAN:  1.  ***   Current medicines are reviewed at length with the patient today.  The patient {ACTIONS; HAS/DOES NOT HAVE:19233} concerns regarding medicines.  The following changes have been made:  {PLAN; NO CHANGE:13088:s}  Labs/ tests ordered today include: *** No orders of the defined types were placed in this encounter.    Disposition:   FU with *** in {gen number 6-24:469507} {Days to years:10300}  Signed, Kathyrn Drown, NP  05/18/2021 8:43 AM    [facility]  [facility], [place], [place]  [address] Phone: [phone]; Fax: [phone]

## 2021-05-30 ENCOUNTER — Ambulatory Visit: Payer: Medicare Other | Admitting: Cardiology

## 2021-05-30 ENCOUNTER — Other Ambulatory Visit: Payer: Self-pay

## 2021-05-30 VITALS — BP 134/84 | HR 60 | Ht 67.0 in | Wt 324.4 lb

## 2021-05-30 DIAGNOSIS — I152 Hypertension secondary to endocrine disorders: Secondary | ICD-10-CM | POA: Diagnosis not present

## 2021-05-30 DIAGNOSIS — G720 Drug-induced myopathy: Secondary | ICD-10-CM

## 2021-05-30 DIAGNOSIS — T466X5D Adverse effect of antihyperlipidemic and antiarteriosclerotic drugs, subsequent encounter: Secondary | ICD-10-CM | POA: Diagnosis not present

## 2021-05-30 DIAGNOSIS — I10 Essential (primary) hypertension: Secondary | ICD-10-CM

## 2021-05-30 DIAGNOSIS — E1159 Type 2 diabetes mellitus with other circulatory complications: Secondary | ICD-10-CM | POA: Diagnosis not present

## 2021-05-30 DIAGNOSIS — I2510 Atherosclerotic heart disease of native coronary artery without angina pectoris: Secondary | ICD-10-CM | POA: Diagnosis not present

## 2021-05-30 DIAGNOSIS — E785 Hyperlipidemia, unspecified: Secondary | ICD-10-CM | POA: Diagnosis not present

## 2021-05-30 NOTE — Patient Instructions (Signed)
Medication Instructions:  ?Your physician recommends that you continue on your current medications as directed. Please refer to the Current Medication list given to you today. ? ?*If you need a refill on your cardiac medications before your next appointment, please call your pharmacy* ? ? ?Lab Work: ?NONE ?If you have labs (blood work) drawn today and your tests are completely normal, you will receive your results only by: ?MyChart Message (if you have MyChart) OR ?A paper copy in the mail ?If you have any lab test that is abnormal or we need to change your treatment, we will call you to review the results. ? ? ?Testing/Procedures: ?NONE ? ? ?Follow-Up: ?At [facility], you and your health needs are our priority.  As part of our continuing mission to provide you with exceptional heart care, we have created designated Provider Care Teams.  These Care Teams include your primary Cardiologist (physician) and Advanced Practice Providers (APPs -  Physician Assistants and Nurse Practitioners) who all work together to provide you with the care you need, when you need it. ? ?We recommend signing up for the patient portal called "MyChart".  Sign up information is provided on this After Visit Summary.  MyChart is used to connect with patients for Virtual Visits (Telemedicine).  Patients are able to view lab/test results, encounter notes, upcoming appointments, etc.  Non-urgent messages can be sent to your provider as well.   ?To learn more about what you can do with MyChart, go to [email or web address].   ? ?Your next appointment:   ?6 month(s) ? ?The format for your next appointment:   ?In Person ? ?Provider:   ?Lauree Chandler, MD  or  Kathyrn Drown        ?1}  ?

## 2021-06-26 ENCOUNTER — Other Ambulatory Visit (HOSPITAL_COMMUNITY): Payer: Self-pay | Admitting: Family Medicine

## 2021-06-26 DIAGNOSIS — I110 Hypertensive heart disease with heart failure: Secondary | ICD-10-CM

## 2021-07-04 ENCOUNTER — Ambulatory Visit (HOSPITAL_COMMUNITY)
Admission: RE | Admit: 2021-07-04 | Discharge: 2021-07-04 | Disposition: A | Discharge status: Home / Self Care | Payer: Medicare Other | Source: Ambulatory Visit | Attending: Family Medicine | Admitting: Family Medicine

## 2021-07-04 DIAGNOSIS — E785 Hyperlipidemia, unspecified: Secondary | ICD-10-CM | POA: Diagnosis not present

## 2021-07-04 DIAGNOSIS — I110 Hypertensive heart disease with heart failure: Secondary | ICD-10-CM | POA: Diagnosis not present

## 2021-07-04 DIAGNOSIS — I509 Heart failure, unspecified: Secondary | ICD-10-CM | POA: Insufficient documentation

## 2021-07-04 DIAGNOSIS — I2510 Atherosclerotic heart disease of native coronary artery without angina pectoris: Secondary | ICD-10-CM | POA: Insufficient documentation

## 2021-07-04 DIAGNOSIS — I7121 Aneurysm of the ascending aorta, without rupture: Secondary | ICD-10-CM | POA: Diagnosis not present

## 2021-07-04 DIAGNOSIS — E119 Type 2 diabetes mellitus without complications: Secondary | ICD-10-CM | POA: Diagnosis not present

## 2021-07-04 DIAGNOSIS — I081 Rheumatic disorders of both mitral and tricuspid valves: Secondary | ICD-10-CM | POA: Diagnosis not present

## 2021-07-04 DIAGNOSIS — I252 Old myocardial infarction: Secondary | ICD-10-CM | POA: Insufficient documentation

## 2021-07-04 LAB — ECHOCARDIOGRAM COMPLETE
Area-P 1/2: 3.45 cm2
Calc EF: 59.6 %
S' Lateral: 3.5 cm
Single Plane A2C EF: 53.7 %
Single Plane A4C EF: 63.1 %

## 2021-07-13 IMAGING — CT CT ANGIOGRAPHY ABDOMEN AND PELVIS WITH CONTRAST AND WITHOUT CONT
2 of 16 series · 10 of 46 positions shown, 17 images · IV contrast (APPLIED)
Comparison: None.

CLINICAL DATA: 63-year-old male with acute GI bleed

EXAM:
CTA ABDOMEN AND PELVIS wITHOUT AND WITH CONTRAST
TECHNIQUE: Multidetector CT imaging of the abdomen and pelvis was performed
using the standard protocol during bolus administration of
intravenous contrast. Multiplanar reconstructed images and MIPs were
obtained and reviewed to evaluate the vascular anatomy.
CONTRAST:  100mL OMNIPAQUE IOHEXOL 350 MG/ML SOLN

[Series 14: cor · coronal · 0.95mm/px · 1 of 202 slices shown, 2 images]
[im 101/202  soft-tissue]
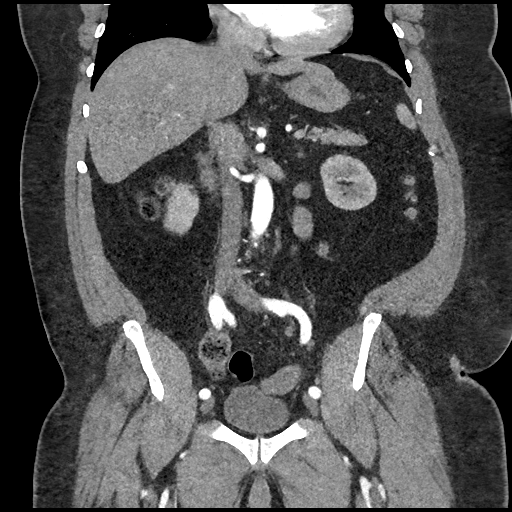
[im 101/202  bone]
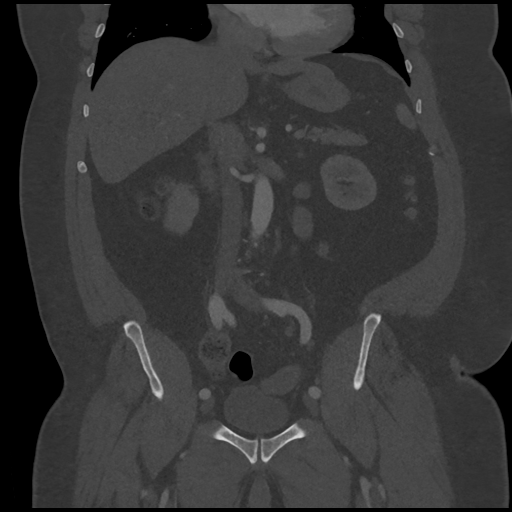

[Series 19: venous thins · axial · portal-venous · 0.91mm/px · z∈[+832,+1224]mm · 9 of 1198 slices shown, 15 images]
[im 109/1198  soft-tissue]
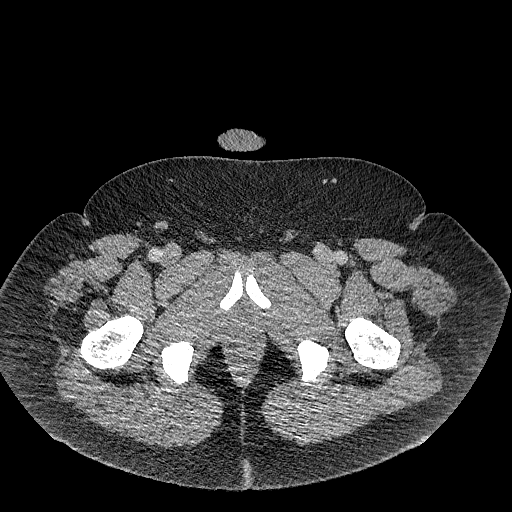
[im 109/1198  bone]
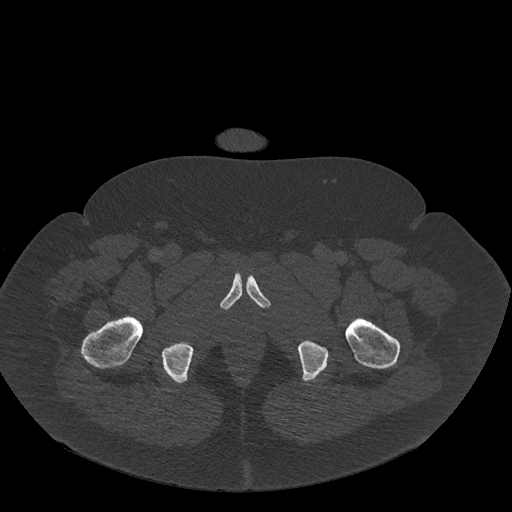
[im 218/1198  soft-tissue]
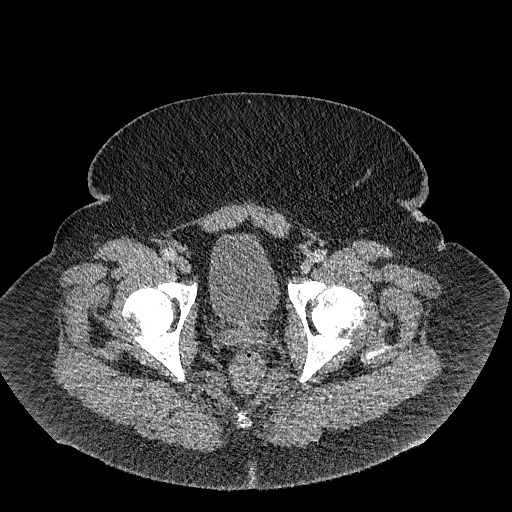
[im 327/1198  soft-tissue]
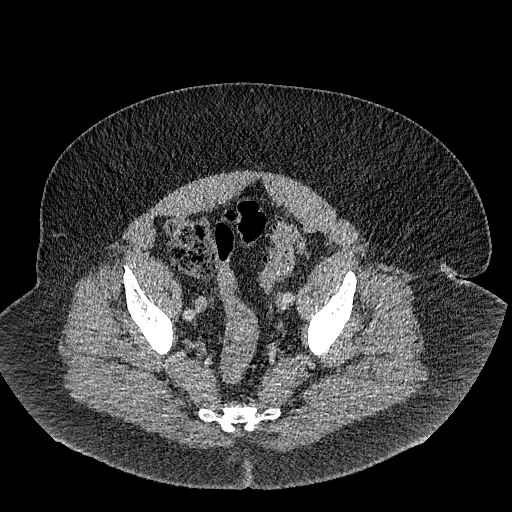
[im 436/1198  soft-tissue]
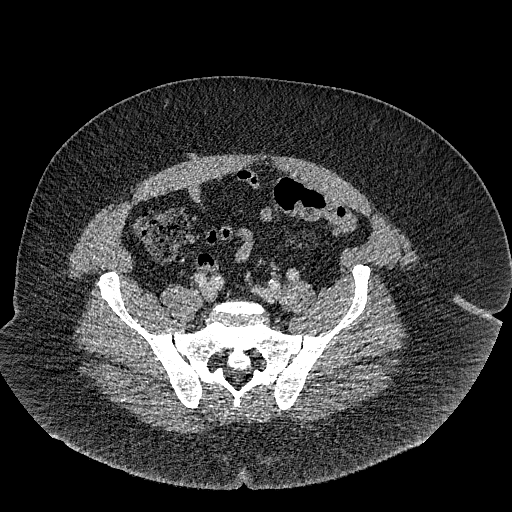
[im 653/1198  soft-tissue]
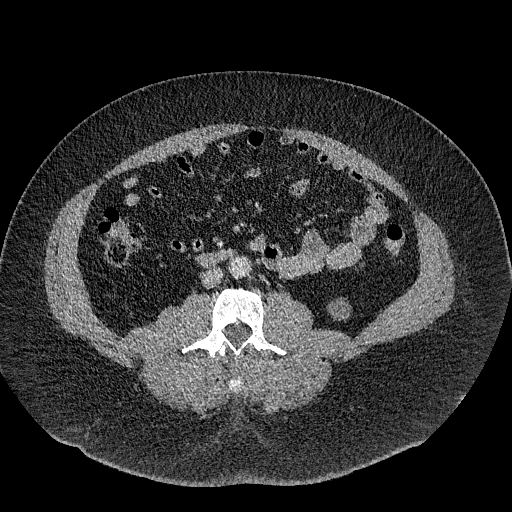
[im 762/1198  soft-tissue]
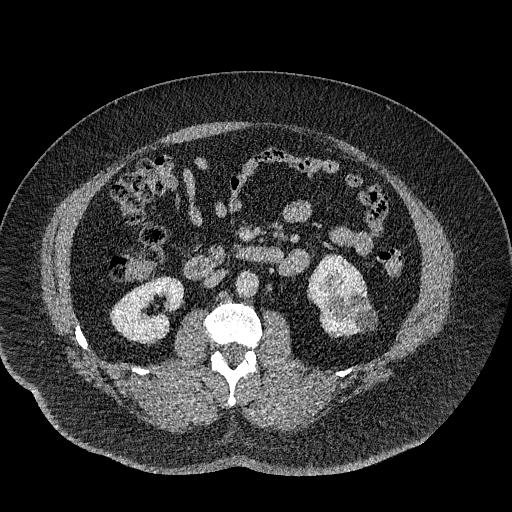
[im 762/1198  lung]
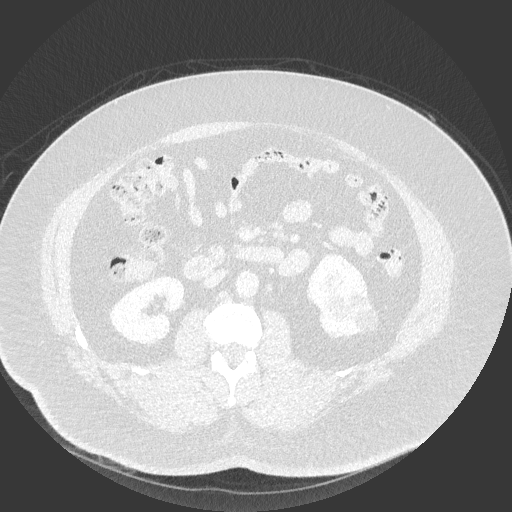
[im 871/1198  soft-tissue]
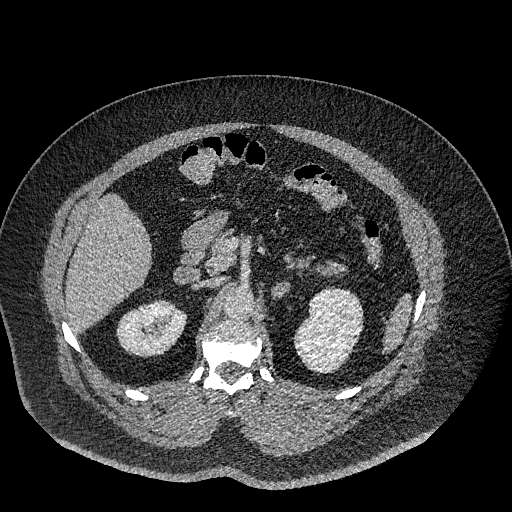
[im 871/1198  lung]
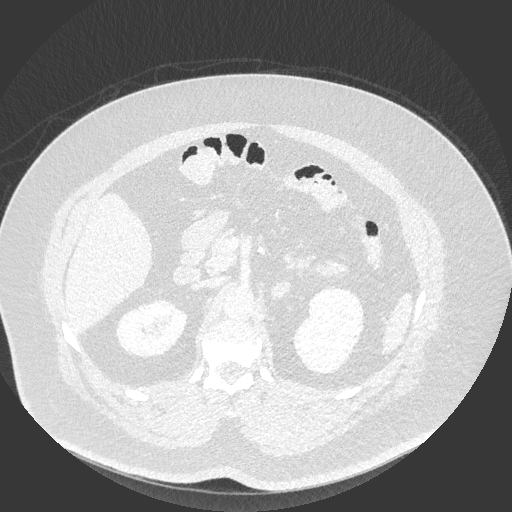
[im 980/1198  soft-tissue]
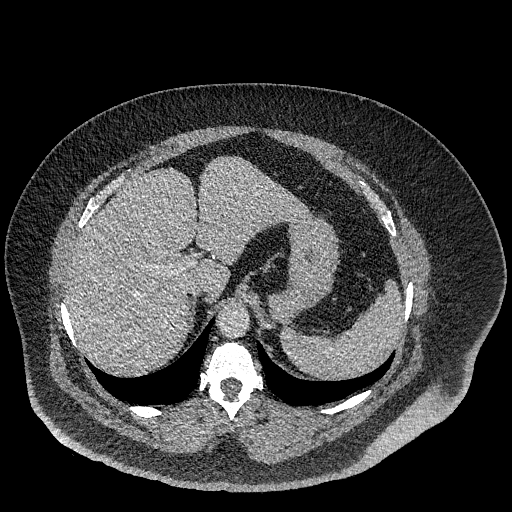
[im 980/1198  lung]
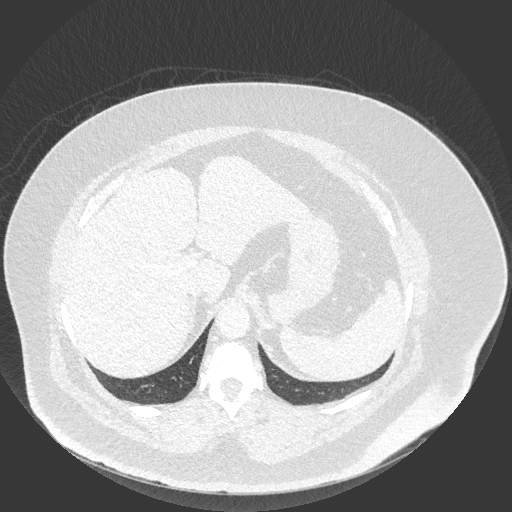
[im 1089/1198  soft-tissue]
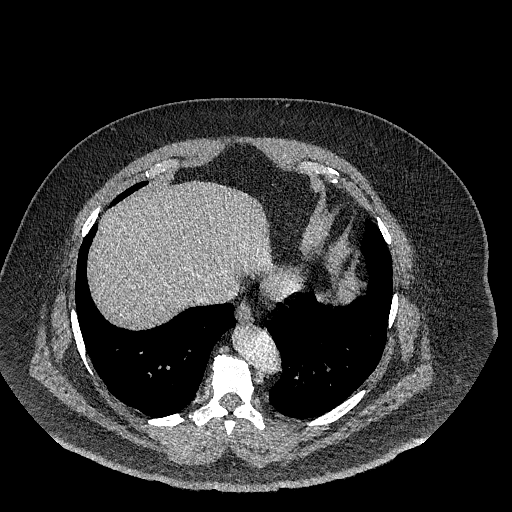
[im 1089/1198  lung]
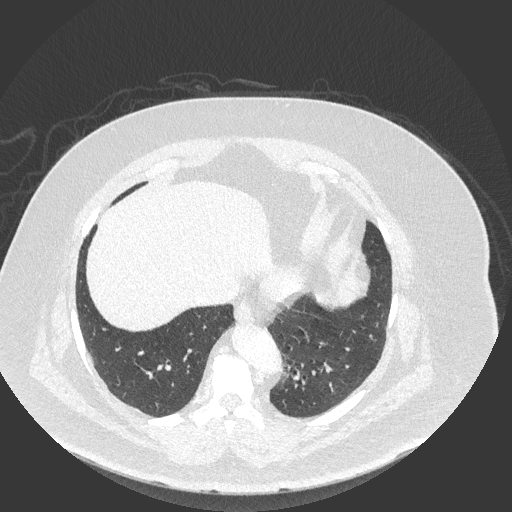
[im 1089/1198  bone]
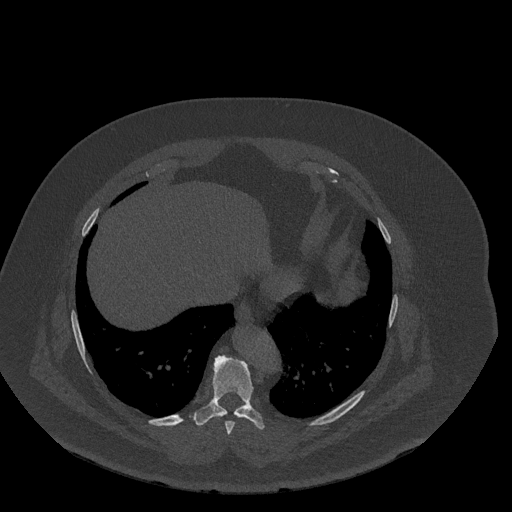

[10 of 46 positions shown; findings below may reference images not displayed]

FINDINGS: VASCULAR

Aorta: Normal in caliber. No significant atherosclerotic plaque. No
aneurysm or dissection.

Celiac: Conventional celiac anatomy. No evidence of aneurysm,
dissection or stenosis.

SMA: Patent without evidence of aneurysm, dissection, vasculitis or
significant stenosis.

Renals: Both renal arteries are patent without evidence of aneurysm,
dissection, vasculitis, fibromuscular dysplasia or significant
stenosis.

IMA: Patent without evidence of aneurysm, dissection, vasculitis or
significant stenosis.

Inflow: Patent without evidence of aneurysm, dissection, vasculitis
or significant stenosis.

Proximal Outflow: Bilateral common femoral and visualized portions
of the superficial and profunda femoral arteries are patent without
evidence of aneurysm, dissection, vasculitis or significant
stenosis.

Veins: No obvious venous abnormality within the limitations of this
arterial phase study.

Review of the MIP images confirms the above findings.

NON-VASCULAR

Lower chest: The lung bases are clear. Visualized cardiac structures
are within normal limits for size. No pericardial effusion.
Unremarkable visualized distal thoracic esophagus.

Hepatobiliary: Normal hepatic contour and morphology. No discrete
hepatic lesion. Multiple calcified gallstones are visible in the
gallbladder neck. No evidence of gallbladder distention, wall
thickening or pericholecystic fluid.

Pancreas: Unremarkable. No pancreatic ductal dilatation or
surrounding inflammatory changes.

Spleen: Normal in size without focal abnormality.

Adrenals/Urinary Tract: Normal adrenal glands. No evidence of
hydronephrosis or nephrolithiasis. Multiple circumscribed
low-attenuation lesions without evidence of contrast enhancement are
present bilaterally. On the right, there is a solitary 2.4 cm lesion
in the lower pole which demonstrates some mildly increased density
but no evidence of enhancement and likely represents a proteinaceous
cyst. On the left, there are multiple cysts, the largest of which
measures 5 cm exophytic from the lower pole. The ureters and bladder
are unremarkable.

Stomach/Bowel: Colonic diverticular disease without CT evidence of
active inflammation. No evidence of focal bowel wall thickening or
inflammation. No contrast is visualized within bowel lumen on either
the arterial or delayed venous phase. Negative for acute GI bleeding
at this time.

Lymphatic: No suspicious lymphadenopathy.

Reproductive: Prostate is unremarkable.

Other: No abdominal wall hernia or abnormality. No abdominopelvic
ascites.

Musculoskeletal: No acute fracture or aggressive appearing lytic or
blastic osseous lesion.
IMPRESSION: 1. Negative for acute gastrointestinal bleeding at this time.
2. No significant arterial abnormality or atherosclerotic plaque.
3. Cholelithiasis without evidence of acute cholecystitis.
4. Colonic diverticular disease without CT evidence of active
inflammation.
5. Bilateral renal cysts of varying complexity.

## 2021-08-11 ENCOUNTER — Other Ambulatory Visit: Payer: Self-pay | Admitting: Family Medicine

## 2021-08-15 ENCOUNTER — Encounter: Payer: Self-pay | Admitting: *Deleted

## 2021-09-01 ENCOUNTER — Other Ambulatory Visit: Payer: Self-pay | Admitting: Family Medicine

## 2021-10-05 ENCOUNTER — Ambulatory Visit: Payer: Medicare Other | Admitting: Cardiovascular Disease

## 2021-10-05 ENCOUNTER — Encounter: Payer: Self-pay | Admitting: Cardiovascular Disease

## 2021-10-05 VITALS — BP 130/78 | HR 75 | Ht 67.0 in | Wt 305.2 lb

## 2021-10-05 DIAGNOSIS — I7121 Aneurysm of the ascending aorta, without rupture: Secondary | ICD-10-CM

## 2021-10-05 DIAGNOSIS — E782 Mixed hyperlipidemia: Secondary | ICD-10-CM

## 2021-10-05 DIAGNOSIS — Z01812 Encounter for preprocedural laboratory examination: Secondary | ICD-10-CM | POA: Diagnosis not present

## 2021-10-05 NOTE — Progress Notes (Signed)
Chief Complaint  Patient presents with   Follow-up    CAD, dyspnea   History of Present Illness: 66 yo male with history of mild CAD, chronic diastolic CHF, DM, GERD, gout, HTN, Hyperlipidemia, obesity, sleep apnea, prior TIA who is here today for follow up. Cardiac cath in 2002 showed 25% stenosis in the distal RCA. He was admitted to [facility] December 2009 with chest pain and ruled out for an MI with serial cardiac enzymes. Stress myoview December 2009 with ejection fraction of 48%. There was mild apical thinning, but no ischemia or scar. There was left ventricular enlargement. Echo in 2013 showed normal LV size and function with LVEF of 55-65% and moderate LVH. There were no significant valvular issues. Lexiscan myoview on 11/15/11 with no inducible ischemia. Admitted to [facility] 12/24/11 with TIA type symptoms. He was seen in our office in March 2023 and was doing well. He has not tolerated statins due to muscle weakness. He has been on Zetia and has refused lipid clinic referral to discuss [place]. His last echo in April 2023 showed normal LV size and function with LVEF=50-55% with inferior wall hypokinesis, moderate LVH, no valve issues. 4.5 cm ascending aorta. This was ordered in primary care.   He is here today for follow up. The patient denies any chest pain, dyspnea, palpitations, lower extremity edema, orthopnea, PND, dizziness, near syncope or syncope.     Primary Care Physician: [name], DO  Past Medical History:  Diagnosis Date   Asthma    CAD (coronary artery disease)    Cardiac cath 2002 with 25% distal RCA stenosis.    DDD (degenerative disc disease), lumbar    Diabetes mellitus ([place])    Diverticulitis    Esophageal stricture    Gastritis 2010   GERD (gastroesophageal reflux disease)    GI bleed    Gout    HTN (hypertension)    Hyperlipidemia    Insomnia    MI (myocardial infarction) ([place]) 2009   no stent placement   OA (osteoarthritis)    Obesity    OSA on CPAP     Seasonal allergies    TIA (transient ischemic attack) 2013    Past Surgical History:  Procedure Laterality Date   COLONOSCOPY WITH PROPOFOL N/A 11/11/2018   Procedure: COLONOSCOPY WITH PROPOFOL;  Surgeon: Thornton Park, MD;  Location: [facility];  Service: Gastroenterology;  Laterality: N/A;   ESOPHAGOGASTRODUODENOSCOPY     multiple   NASAL SINUS SURGERY     POLYPECTOMY  11/11/2018   Procedure: POLYPECTOMY;  Surgeon: Thornton Park, MD;  Location: [facility] ENDOSCOPY;  Service: Gastroenterology;;   VASECTOMY      Current Outpatient Medications  Medication Sig Dispense Refill   acetaminophen (TYLENOL) 500 MG tablet Take 500 mg by mouth every 6 (six) hours as needed for moderate pain.     allopurinol (ZYLOPRIM) 100 MG tablet Take 1 tablet (100 mg total) by mouth daily. 60 tablet 3   amLODipine (NORVASC) 10 MG tablet TAKE 1 TABLET BY MOUTH ONCE DAILY WITH SUPPER 90 tablet 0   carvedilol (COREG) 25 MG tablet TAKE 1 TABLET BY MOUTH ONCE DAILY WITH SUPPER 90 tablet 3   colchicine 0.6 MG tablet Take 1 tablet (0.6 mg total) by mouth daily. (Patient taking differently: Take 0.6 mg by mouth as needed.) 6 tablet 0   glucose blood test strip 1 each by Other route daily. Use as instructed 100 each 12   irbesartan (AVAPRO) 150 MG tablet Take 1 tablet (150  mg total) by mouth daily. Please make overdue appt with Dr. Angelena Form before anymore refills  Thank you 3rd and Final attempt 15 tablet 0   naproxen (NAPROSYN) 500 MG tablet TAKE 1 TABLET BY MOUTH ONCE DAILY AS NEEDED FOR MODERATE PAIN 30 tablet 0   omeprazole (PRILOSEC) 40 MG capsule TAKE 1 CAPSULE BY MOUTH ONCE DAILY WITH SUPPER 90 capsule 0   triamcinolone ointment (KENALOG) 0.5 % APPLY TOPICALLY TO AFFECTED AREA AS NEEDED 15 g 0   No current facility-administered medications for this visit.    Allergies  Allergen Reactions   Atorvastatin Other (See Comments)    Subjective myalgias. Gave 2 trials, developed myalgias which resolved after  cessation of medication.  Muscle cramps were 9/10 discomfort   Codeine Nausea Only   Crestor [Rosuvastatin]     myalgias   Shellfish Allergy Other (See Comments)    Activates asthma    Social History   Socioeconomic History   Marital status: Widowed    Spouse name: Not on file   Number of children: 4   Years of education: 54   Highest education level: Not on file  Occupational History   Occupation: Disability-truck driver  Tobacco Use   Smoking status: Former    Packs/day: 1.50    Years: 10.00    Total pack years: 15.00    Types: Cigarettes    Quit date: 11/06/1991    Years since quitting: 29.9   Smokeless tobacco: Never   Tobacco comments:    quit 1993  Substance and Sexual Activity   Alcohol use: No   Drug use: No   Sexual activity: Not on file  Other Topics Concern   Not on file  Social History Narrative   On disability   [employer]   Quit smoking 20 years ago (as of 01/2012)      Health Care POA:    Emergency Contact: brother, Mitzi Hansen Fries: gave Ad pamphlet 5/15   Who lives with you: self   Any pets: none   Diet: pt has a varied diet of protein, starch and vegetables.   Exercise: pt does not have regular exercise routine.   Seatbelts: Pt reports wearing seatbelt when in vehicles.    Hobbies: plays bass and keyboard in church music group         Social Determinants of Health   Financial Resource Strain: Not on file  Food Insecurity: Not on file  Transportation Needs: Not on file  Physical Activity: Not on file  Stress: Not on file  Social Connections: Not on file  Intimate Partner Violence: Not on file    Family History  Problem Relation Age of Onset   Heart attack Mother    Heart failure Mother    Colon cancer Neg Hx    Esophageal cancer Neg Hx    Rectal cancer Neg Hx    Stomach cancer Neg Hx     Review of Systems:  As stated in the HPI and otherwise negative.   BP 130/78   Pulse 75   Ht '5\' 7"'$  (1.702 m)   Wt (!) 305  lb 3.2 oz (138.4 kg)   SpO2 97%   BMI 47.80 kg/m   Physical Examination: General: Well developed, well nourished, NAD  HEENT: OP clear, mucus membranes moist  SKIN: warm, dry. No rashes. Neuro: No focal deficits  Musculoskeletal: Muscle strength 5/5 all ext  Psychiatric: Mood and affect normal  Neck: No JVD, no carotid bruits,  no thyromegaly, no lymphadenopathy.  Lungs:Clear bilaterally, no wheezes, rhonci, crackles Cardiovascular: Regular rate and rhythm. No murmurs, gallops or rubs. Abdomen:Soft. Bowel sounds present. Non-tender.  Extremities: No lower extremity edema. Pulses are 2 + in the bilateral DP/PT.   EKG:  EKG is not ordered today. The ekg ordered today demonstrates   Echo April 2023:  1. Left ventricular ejection fraction, by estimation, is 50 to 55%. The  left ventricle has low normal function. The left ventricle demonstrates  regional wall motion abnormalities (see scoring diagram/findings for  description). There is moderate  concentric left ventricular hypertrophy. Left ventricular diastolic  parameters are consistent with Grade I diastolic dysfunction (impaired  relaxation).   2. Right ventricular systolic function is normal. The right ventricular  size is normal. Tricuspid regurgitation signal is inadequate for assessing  PA pressure.   3. The mitral valve is grossly normal. No evidence of mitral valve  regurgitation.   4. The aortic valve was not well visualized. Aortic valve regurgitation  is not visualized.   5. Aortic small ascending aortic aneurysm 4.5 cm.   6. The inferior vena cava is normal in size with greater than 50%  respiratory variability, suggesting right atrial pressure of 3 mmHg.   Recent Labs: 01/24/2021: BUN 15; Creatinine, Ser 1.04; Hemoglobin 13.2; Platelets 367; Potassium 4.4; Sodium 137   Lipid Panel    Component Value Date/Time   CHOL 233 (H) 01/24/2021 1518   TRIG 237 (H) 01/24/2021 1518   HDL 30 (L) 01/24/2021 1518    CHOLHDL 7.8 (H) 01/24/2021 1518   CHOLHDL 8.4 09/06/2017 0512   VLDL 39 09/06/2017 0512   LDLCALC 159 (H) 01/24/2021 1518   LDLDIRECT 130 (H) 02/22/2012 0910     Wt Readings from Last 3 Encounters:  10/05/21 (!) 305 lb 3.2 oz (138.4 kg)  05/30/21 (!) 324 lb 6.4 oz (147.1 kg)  01/24/21 (!) 322 lb (146.1 kg)       Assessment and Plan:   1.CAD without angina: He had mild disease by cath 2002. No chest pain or dyspnea. Echo in April 2023 with inferior wall hypokinesis. No plans for ischemic workup at this time. He does not tolerate statins. His ASA was stopped due to diverticular bleeding.    2.  Hyperlipidemia: He has stopped his statin due to muscle aches. He had trouble with Lipitor and Crestor. He was on Zetia but he stopped this. He is not sure why. He prefers not to to take an injectable medication but I think he should consider Repatha. He will restart Zetia. Will place referral to lipid clinic to review all options again.    3. HTN: BP is controlled. No changes today   4. OSA: He wears CPAP   5. Thoracic aortic aneurysm:  Seen on echo. Will arrange chest CTA to evaluate   Current medicines are reviewed at length with the patient today.  The patient does not have concerns regarding medicines.  The following changes have been made:  no change  Labs/ tests ordered today include:   Orders Placed This Encounter  Procedures   CT ANGIO CHEST AORTA W/CM & OR WO/CM   Basic Metabolic Panel (BMET)   AMB Referral to [facility]     Disposition:   F/U with me in one year.    Signed, Lauree Chandler, MD 10/05/2021 2:29 PM    [facility] [address], [place], [place]  [address] Phone: [phone]; Fax: [phone]

## 2021-10-05 NOTE — Patient Instructions (Signed)
Medication Instructions:  No changes *If you need a refill on your cardiac medications before your next appointment, please call your pharmacy*   Lab Work: Today: BMET If you have labs (blood work) drawn today and your tests are completely normal, you will receive your results only by: [place] (if you have MyChart) OR A paper copy in the mail If you have any lab test that is abnormal or we need to change your treatment, we will call you to review the results.   Testing/Procedures: Chest CT - Aorta    Follow-Up: At [facility], you and your health needs are our priority.  As part of our continuing mission to provide you with exceptional heart care, we have created designated Provider Care Teams.  These Care Teams include your primary Cardiologist (physician) and Advanced Practice Providers (APPs -  Physician Assistants and Nurse Practitioners) who all work together to provide you with the care you need, when you need it.   Your next appointment:   12 month(s)  The format for your next appointment:   In Person  Provider:   Lauree Chandler, MD     Other Instructions You have been referred to Lipid Clinic Pharmacist   Important Information About Sugar

## 2021-10-06 LAB — BASIC METABOLIC PANEL
BUN/Creatinine Ratio: 18 (ref 10–24)
BUN: 20 mg/dL (ref 8–27)
CO2: 20 mmol/L (ref 20–29)
Calcium: 9.7 mg/dL (ref 8.6–10.2)
Chloride: 104 mmol/L (ref 96–106)
Creatinine, Ser: 1.14 mg/dL (ref 0.76–1.27)
Glucose: 93 mg/dL (ref 70–99)
Potassium: 4.3 mmol/L (ref 3.5–5.2)
Sodium: 141 mmol/L (ref 134–144)
eGFR: 71 mL/min/{1.73_m2} (ref >59)

## 2021-10-12 ENCOUNTER — Other Ambulatory Visit: Payer: Self-pay | Admitting: *Deleted

## 2021-10-12 ENCOUNTER — Ambulatory Visit (HOSPITAL_COMMUNITY)
Admission: RE | Admit: 2021-10-12 | Discharge: 2021-10-12 | Disposition: A | Discharge status: Home / Self Care | Payer: Medicare Other | Source: Ambulatory Visit | Attending: Cardiovascular Disease | Admitting: Cardiovascular Disease

## 2021-10-12 DIAGNOSIS — I7121 Aneurysm of the ascending aorta, without rupture: Secondary | ICD-10-CM | POA: Insufficient documentation

## 2021-10-12 MED ORDER — IOHEXOL 350 MG/ML SOLN
100.0000 mL | Freq: Once | INTRAVENOUS | Status: AC | PRN
Start: 2021-10-12 — End: 2021-10-12
  Administered 2021-10-12: 100 mL via INTRAVENOUS

## 2021-10-12 NOTE — Progress Notes (Signed)
Order placed to repeat chest CTA aorta in one year.

## 2021-10-31 ENCOUNTER — Ambulatory Visit: Payer: Medicare Other | Admitting: Pharmacist

## 2021-10-31 NOTE — Progress Notes (Deleted)
Patient ID: KEANEN DOHSE                 DOB: 09-12-1955                    MRN: [id number]     HPI: Alex [name] is a 66 y.o. male patient referred to lipid clinic by Dr Angelena Form. PMH is significant for CAD, chronic diastolic CHF, DM, HTN, HLD, TIA in 2013, OSA, obesity, GERD, and gout. Cath in 2002 showed 25% stenosis of distal RCA. Chest CT 10/12/21 showed coronary artery calcifications.  Muscle weakness on statins Has been on zetia then stopped but didn't know why, refused lipid clinic referral to discuss repatha Did pt restart zetia? Not added back to his med list Pravastatin 20-'80mg'$  back in 2015, why did pt stop? Needs repatha TG also high, discuss diet  Current Medications:  Intolerances: atorvastatin 20-'40mg'$  daily, rosuvastatin '10mg'$  daily, simvastatin '20mg'$  daily, pravastatin 20-'80mg'$  daily - myalgias Risk Factors: nonobstructive CAD, DM, HTN, TIA, CHF LDL goal: '55mg'$ /dL  Diet:   Exercise:   Family History: Mother with MI and HF.  Social History: Former smoker 1.5 PPD for 15 years, quit in 1993.  Labs: 01/24/21: TC 233, TG 237, HDL 30, LDL 159 03/23/20: TC 253, TG 179, HDL 33, LDL 186  Past Medical History:  Diagnosis Date   Asthma    CAD (coronary artery disease)    Cardiac cath 2002 with 25% distal RCA stenosis.    DDD (degenerative disc disease), lumbar    Diabetes mellitus ([place])    Diverticulitis    Esophageal stricture    Gastritis 2010   GERD (gastroesophageal reflux disease)    GI bleed    Gout    HTN (hypertension)    Hyperlipidemia    Insomnia    MI (myocardial infarction) ([place]) 2009   no stent placement   OA (osteoarthritis)    Obesity    OSA on CPAP    Seasonal allergies    TIA (transient ischemic attack) 2013    Current Outpatient Medications on File Prior to Visit  Medication Sig Dispense Refill   acetaminophen (TYLENOL) 500 MG tablet Take 500 mg by mouth every 6 (six) hours as needed for moderate pain.     allopurinol (ZYLOPRIM) 100 MG  tablet Take 1 tablet (100 mg total) by mouth daily. 60 tablet 3   amLODipine (NORVASC) 10 MG tablet TAKE 1 TABLET BY MOUTH ONCE DAILY WITH SUPPER 90 tablet 0   carvedilol (COREG) 25 MG tablet TAKE 1 TABLET BY MOUTH ONCE DAILY WITH SUPPER 90 tablet 3   colchicine 0.6 MG tablet Take 1 tablet (0.6 mg total) by mouth daily. (Patient taking differently: Take 0.6 mg by mouth as needed.) 6 tablet 0   glucose blood test strip 1 each by Other route daily. Use as instructed 100 each 12   irbesartan (AVAPRO) 150 MG tablet Take 1 tablet (150 mg total) by mouth daily. Please make overdue appt with Dr. Angelena Form before anymore refills  Thank you 3rd and Final attempt 15 tablet 0   naproxen (NAPROSYN) 500 MG tablet TAKE 1 TABLET BY MOUTH ONCE DAILY AS NEEDED FOR MODERATE PAIN 30 tablet 0   omeprazole (PRILOSEC) 40 MG capsule TAKE 1 CAPSULE BY MOUTH ONCE DAILY WITH SUPPER 90 capsule 0   triamcinolone ointment (KENALOG) 0.5 % APPLY TOPICALLY TO AFFECTED AREA AS NEEDED 15 g 0   No current facility-administered medications on file prior to visit.  Allergies  Allergen Reactions   Atorvastatin Other (See Comments)    Subjective myalgias. Gave 2 trials, developed myalgias which resolved after cessation of medication.  Muscle cramps were 9/10 discomfort   Codeine Nausea Only   Crestor [Rosuvastatin]     myalgias   Shellfish Allergy Other (See Comments)    Activates asthma    Assessment/Plan:  1. Hyperlipidemia -

## 2021-11-29 ENCOUNTER — Ambulatory Visit: Payer: Medicare Other | Admitting: Cardiovascular Disease

## 2022-05-17 ENCOUNTER — Other Ambulatory Visit: Payer: Self-pay | Admitting: Family Medicine

## 2022-06-11 ENCOUNTER — Ambulatory Visit (INDEPENDENT_AMBULATORY_CARE_PROVIDER_SITE_OTHER): Payer: Medicare Other | Admitting: Family Medicine

## 2022-06-11 ENCOUNTER — Encounter: Payer: Self-pay | Admitting: Family Medicine

## 2022-06-11 VITALS — BP 124/86 | HR 81 | Ht 67.0 in | Wt 322.4 lb

## 2022-06-11 DIAGNOSIS — E119 Type 2 diabetes mellitus without complications: Secondary | ICD-10-CM | POA: Diagnosis not present

## 2022-06-11 DIAGNOSIS — M10029 Idiopathic gout, unspecified elbow: Secondary | ICD-10-CM

## 2022-06-11 DIAGNOSIS — I10 Essential (primary) hypertension: Secondary | ICD-10-CM

## 2022-06-11 DIAGNOSIS — I2510 Atherosclerotic heart disease of native coronary artery without angina pectoris: Secondary | ICD-10-CM | POA: Diagnosis not present

## 2022-06-11 DIAGNOSIS — R5382 Chronic fatigue, unspecified: Secondary | ICD-10-CM

## 2022-06-11 LAB — POCT GLYCOSYLATED HEMOGLOBIN (HGB A1C): HbA1c, POC (prediabetic range): 6.2 % (ref 5.7–6.4)

## 2022-06-11 NOTE — Patient Instructions (Signed)
Good to see you today - Thank you for coming in  Things we discussed today:  1) For your tiredness, let's check a few labs today. This is most likely from your body recovering from the gum infection but we will keep an eye on it.  2) For your gout: - Start taking allopurinol once a day. This med helps PREVENT gout flares - Only take colchicine when you have a gout flare. This med help STOP a gout flare after it starts.  Please always bring your medication bottles  Come back to see me in 1 month to check your labs again.

## 2022-06-11 NOTE — Progress Notes (Signed)
    SUBJECTIVE:   CHIEF COMPLAINT / HPI:   Alex [name] is a 67yo M presenting w/ fatigue and meet new PCP.  Reports feeling recent tiredness since having a gum infection earlier this month. Saw dentistry and was started on antibx (doesn't remember the name). He has since started feeling better, but still not back to baseline. Has OSA, reports wearing Bipap consistently.  PMHx OSA Diastolic HF (has Cardiologist) HTN CAD T2DM (not on meds)  Social: Tobacco use: 77yr, stopped 30 yrs ago.  Alcohol: none Other substances: none  OBJECTIVE:   BP 124/86   Pulse 81   Ht 5\' 7"  (1.702 m)   Wt (!) 146.2 kg   SpO2 96%   BMI 50.49 kg/m   Gen: Pleasant, joking, NAD HEENT: NCAT. MMM. Resp: CTAB, no wheezing or crackles. Normal WOB on RA CV: RRR Abm: Soft, nontender, nondistended. Normal BS.   ASSESSMENT/PLAN:   Gout Reports stopping allopurinol due to not finding it effective and did not know the purpose. Explained role in flare prophylaxis, and pt is agreeable to restarting. - Restart allopurinol 100mg  daily - f/u in 1 month to check uric acid  Coronary artery disease Lipid panel today shows mildly worsening dislipidemia compared to prior. Has hx of CAD and Diastolic HF. Tried 4 statins prior but unable to tolerate. Follows with cardiology.  - Consider other lipid lowering agents vs return to Cardiologist for further management  Hypertension Reports daily adherence to HTN meds. Initially elevated, but wnl on recheck.  - UACR showed mildly worsening kidney function - Consider referral to Nephrology  Diabetes mellitus without complication [id number] at goal w/o medications. - need foot exam and eye exam   Fatigue Reports ~1 month of fatigue, was found to have gum infxn and started on antibx by dentist. Feels better but still has some fatigue. Reports consistent use of Bipap.  - CBC wnl - Consider further workup if persistent at f/u   Lipid, A1c, cbc, UACR  [name], MD [place]

## 2022-06-12 LAB — BASIC METABOLIC PANEL
BUN/Creatinine Ratio: 16 (ref 10–24)
BUN: 18 mg/dL (ref 8–27)
CO2: 21 mmol/L (ref 20–29)
Calcium: 9.7 mg/dL (ref 8.6–10.2)
Chloride: 104 mmol/L (ref 96–106)
Creatinine, Ser: 1.14 mg/dL (ref 0.76–1.27)
Glucose: 83 mg/dL (ref 70–99)
Potassium: 4.6 mmol/L (ref 3.5–5.2)
Sodium: 140 mmol/L (ref 134–144)
eGFR: 70 mL/min/{1.73_m2} (ref >59)

## 2022-06-12 LAB — LIPID PANEL
Chol/HDL Ratio: 8.3 ratio — ABNORMAL HIGH (ref 0.0–5.0)
Cholesterol, Total: 249 mg/dL — ABNORMAL HIGH (ref 100–199)
HDL: 30 mg/dL — ABNORMAL LOW (ref >39)
LDL Chol Calc (NIH): 155 mg/dL — ABNORMAL HIGH (ref 0–99)
Triglycerides: 339 mg/dL — ABNORMAL HIGH (ref 0–149)
VLDL Cholesterol Cal: 64 mg/dL — ABNORMAL HIGH (ref 5–40)

## 2022-06-12 LAB — CBC
Hematocrit: 39.5 % (ref 37.5–51.0)
Hemoglobin: 12.9 g/dL — ABNORMAL LOW (ref 13.0–17.7)
MCH: 25.7 pg — ABNORMAL LOW (ref 26.6–33.0)
MCHC: 32.7 g/dL (ref 31.5–35.7)
MCV: 79 fL (ref 79–97)
Platelets: 306 10*3/uL (ref 150–450)
RBC: 5.01 x10E6/uL (ref 4.14–5.80)
RDW: 14.9 % (ref 11.6–15.4)
WBC: 8.9 10*3/uL (ref 3.4–10.8)

## 2022-06-12 LAB — MICROALBUMIN / CREATININE URINE RATIO
Creatinine, Urine: 146.1 mg/dL
Microalb/Creat Ratio: 61 mg/g creat — ABNORMAL HIGH (ref 0–29)
Microalbumin, Urine: 89.8 ug/mL

## 2022-06-14 MED ORDER — ALLOPURINOL 100 MG PO TABS: 100.0000 mg | ORAL_TABLET | Freq: Every day | ORAL | 3 refills | Status: DC

## 2022-06-14 NOTE — Assessment & Plan Note (Addendum)
Lipid panel today shows mildly worsening dislipidemia compared to prior. Has hx of CAD and Diastolic HF. Tried 4 statins prior but unable to tolerate. Follows with cardiology.  - Consider other lipid lowering agents vs return to Cardiologist for further management

## 2022-06-14 NOTE — Assessment & Plan Note (Signed)
Reports ~1 month of fatigue, was found to have gum infxn and started on antibx by dentist. Feels better but still has some fatigue. Reports consistent use of Bipap.  - CBC wnl - Consider further workup if persistent at f/u

## 2022-06-14 NOTE — Assessment & Plan Note (Addendum)
Reports stopping allopurinol due to not finding it effective and did not know the purpose. Explained role in flare prophylaxis, and pt is agreeable to restarting. - Restart allopurinol 100mg  daily - f/u in 1 month to check uric acid

## 2022-06-14 NOTE — Assessment & Plan Note (Addendum)
Reports daily adherence to HTN meds. Initially elevated, but wnl on recheck.  - UACR showed mildly worsening kidney function - Consider referral to Nephrology

## 2022-06-14 NOTE — Assessment & Plan Note (Signed)
A1c at goal w/o medications. - need foot exam and eye exam

## 2022-06-16 ENCOUNTER — Other Ambulatory Visit: Payer: Self-pay

## 2022-06-16 ENCOUNTER — Emergency Department (HOSPITAL_COMMUNITY): Payer: Medicare Other

## 2022-06-16 ENCOUNTER — Emergency Department (HOSPITAL_COMMUNITY)
Admission: EM | Admit: 2022-06-16 | Discharge: 2022-06-17 | Disposition: A | Discharge status: Home / Self Care | Payer: Medicare Other | Attending: Emergency Medicine | Admitting: Emergency Medicine

## 2022-06-16 ENCOUNTER — Encounter (HOSPITAL_COMMUNITY): Payer: Self-pay

## 2022-06-16 ENCOUNTER — Telehealth: Payer: Self-pay | Admitting: Student

## 2022-06-16 DIAGNOSIS — R9431 Abnormal electrocardiogram [ECG] [EKG]: Secondary | ICD-10-CM | POA: Insufficient documentation

## 2022-06-16 DIAGNOSIS — G459 Transient cerebral ischemic attack, unspecified: Secondary | ICD-10-CM

## 2022-06-16 DIAGNOSIS — Z7982 Long term (current) use of aspirin: Secondary | ICD-10-CM | POA: Diagnosis not present

## 2022-06-16 DIAGNOSIS — R200 Anesthesia of skin: Secondary | ICD-10-CM | POA: Diagnosis present

## 2022-06-16 LAB — COMPREHENSIVE METABOLIC PANEL
ALT: 26 U/L (ref 0–44)
AST: 29 U/L (ref 15–41)
Albumin: 3.7 g/dL (ref 3.5–5.0)
Alkaline Phosphatase: 90 U/L (ref 38–126)
Anion gap: 9 (ref 5–15)
BUN: 24 mg/dL — ABNORMAL HIGH (ref 8–23)
CO2: 23 mmol/L (ref 22–32)
Calcium: 9.3 mg/dL (ref 8.9–10.3)
Chloride: 104 mmol/L (ref 98–111)
Creatinine, Ser: 1.1 mg/dL (ref 0.61–1.24)
GFR, Estimated: >60 mL/min (ref >60)
Glucose, Bld: 112 mg/dL — ABNORMAL HIGH (ref 70–99)
Potassium: 4.1 mmol/L (ref 3.5–5.1)
Sodium: 136 mmol/L (ref 135–145)
Total Bilirubin: 0.8 mg/dL (ref 0.3–1.2)
Total Protein: 8.2 g/dL — ABNORMAL HIGH (ref 6.5–8.1)

## 2022-06-16 LAB — CBC
HCT: 40.9 % (ref 39.0–52.0)
Hemoglobin: 13.4 g/dL (ref 13.0–17.0)
MCH: 26.2 pg (ref 26.0–34.0)
MCHC: 32.8 g/dL (ref 30.0–36.0)
MCV: 79.9 fL — ABNORMAL LOW (ref 80.0–100.0)
Platelets: 304 10*3/uL (ref 150–400)
RBC: 5.12 MIL/uL (ref 4.22–5.81)
RDW: 15.9 % — ABNORMAL HIGH (ref 11.5–15.5)
WBC: 9.6 10*3/uL (ref 4.0–10.5)
nRBC: 0 % (ref 0.0–0.2)

## 2022-06-16 LAB — PROTIME-INR
INR: 1 (ref 0.8–1.2)
Prothrombin Time: 13.3 seconds (ref 11.4–15.2)

## 2022-06-16 LAB — DIFFERENTIAL
Abs Immature Granulocytes: 0.03 10*3/uL (ref 0.00–0.07)
Basophils Absolute: 0 10*3/uL (ref 0.0–0.1)
Basophils Relative: 0 %
Eosinophils Absolute: 0.3 10*3/uL (ref 0.0–0.5)
Eosinophils Relative: 3 %
Immature Granulocytes: 0 %
Lymphocytes Relative: 30 %
Lymphs Abs: 2.9 10*3/uL (ref 0.7–4.0)
Monocytes Absolute: 0.8 10*3/uL (ref 0.1–1.0)
Monocytes Relative: 8 %
Neutro Abs: 5.6 10*3/uL (ref 1.7–7.7)
Neutrophils Relative %: 59 %

## 2022-06-16 LAB — I-STAT CHEM 8, ED
BUN: 28 mg/dL — ABNORMAL HIGH (ref 8–23)
Calcium, Ion: 1.17 mmol/L (ref 1.15–1.40)
Chloride: 107 mmol/L (ref 98–111)
Creatinine, Ser: 1.1 mg/dL (ref 0.61–1.24)
Glucose, Bld: 104 mg/dL — ABNORMAL HIGH (ref 70–99)
HCT: 59 % — ABNORMAL HIGH (ref 39.0–52.0)
Hemoglobin: 20.1 g/dL — ABNORMAL HIGH (ref 13.0–17.0)
Potassium: 4 mmol/L (ref 3.5–5.1)
Sodium: 142 mmol/L (ref 135–145)
TCO2: 23 mmol/L (ref 22–32)

## 2022-06-16 LAB — APTT: aPTT: 31 seconds (ref 24–36)

## 2022-06-16 LAB — ETHANOL: Alcohol, Ethyl (B): <10 mg/dL (ref <10)

## 2022-06-16 MED ORDER — CARVEDILOL 12.5 MG PO TABS
25.0000 mg | ORAL_TABLET | Freq: Every day | ORAL | Status: DC
  Administered 2022-06-16: 25 mg via ORAL
  Filled 2022-06-16: qty 2

## 2022-06-16 MED ORDER — ALLOPURINOL 100 MG PO TABS: 100.0000 mg | ORAL_TABLET | Freq: Every day | ORAL | Status: DC

## 2022-06-16 MED ORDER — COLCHICINE 0.6 MG PO TABS: 0.6000 mg | ORAL_TABLET | Freq: Every day | ORAL | Status: DC | PRN

## 2022-06-16 MED ORDER — ACETAMINOPHEN 500 MG PO TABS: 500.0000 mg | ORAL_TABLET | Freq: Four times a day (QID) | ORAL | Status: DC | PRN

## 2022-06-16 MED ORDER — CARVEDILOL 12.5 MG PO TABS: 25.0000 mg | ORAL_TABLET | Freq: Every day | ORAL | Status: DC

## 2022-06-16 MED ORDER — ASPIRIN 325 MG PO TBEC: 325.0000 mg | DELAYED_RELEASE_TABLET | Freq: Every day | ORAL | 0 refills | Status: DC

## 2022-06-16 MED ORDER — AMLODIPINE BESYLATE 5 MG PO TABS
10.0000 mg | ORAL_TABLET | Freq: Every day | ORAL | Status: DC
  Administered 2022-06-16: 10 mg via ORAL
  Filled 2022-06-16: qty 2

## 2022-06-16 MED ORDER — SODIUM CHLORIDE 0.9% FLUSH: 3.0000 mL | Freq: Once | INTRAVENOUS | Status: DC

## 2022-06-16 MED ORDER — PANTOPRAZOLE SODIUM 40 MG PO TBEC: 40.0000 mg | DELAYED_RELEASE_TABLET | Freq: Every day | ORAL | Status: DC

## 2022-06-16 MED ORDER — IRBESARTAN 300 MG PO TABS
150.0000 mg | ORAL_TABLET | Freq: Every day | ORAL | Status: DC
  Administered 2022-06-16: 150 mg via ORAL
  Filled 2022-06-16: qty 1

## 2022-06-16 MED ORDER — IRBESARTAN 300 MG PO TABS: 150.0000 mg | ORAL_TABLET | Freq: Every day | ORAL | Status: DC

## 2022-06-16 MED ORDER — AMLODIPINE BESYLATE 5 MG PO TABS: 10.0000 mg | ORAL_TABLET | Freq: Every day | ORAL | Status: DC

## 2022-06-16 NOTE — ED Notes (Signed)
Patient transported to CT 

## 2022-06-16 NOTE — Discharge Instructions (Addendum)
You were evaluated in the Emergency Department and after careful evaluation, we did not find any emergent condition requiring admission or further testing in the hospital.  Your exam/testing today was overall reassuring.  Symptoms may have been due to a transient ischemic attack.  Take the 81 mg aspirin daily to prevent stroke and follow-up with the neurologist for further testing.  Please return to the Emergency Department if you experience any worsening of your condition.  Thank you for allowing [place] to be a part of your care.

## 2022-06-16 NOTE — ED Triage Notes (Signed)
Pt states he was walking and swinging his arms and his left arm and left leg became numb at 1030. Pt states the arm and leg numbness has resolved. Pt c/o HA and non radiating left sided chest pain started today.

## 2022-06-16 NOTE — ED Provider Notes (Signed)
  Provider Note MRN:  [id number]  Arrival date & time: 06/17/22    ED Course and Medical Decision Making  Assumed care from Dr. Lynelle Doctor at shift change.  Suspicion for TIA with unilateral transient numbness to arm and leg awaiting CTA and MRI.  If negative candidate for discharge on aspirin per neuro.  Patient unable to tolerate MRI due to issues laying flat for an extended period of time as well as claustrophobia.  Was able to tolerate the CTA which is reassuring.  Case discussed again with Dr. Otelia Limes.  Patient definitely wants to go home, prefers outpatient management.  This is appropriate per neurology, will start on daily aspirin and refer to neurology.  Procedures  Final Clinical Impressions(s) / ED Diagnoses     ICD-10-CM   1. TIA (transient ischemic attack)  G45.9       ED Discharge Orders          Ordered    aspirin 81 MG chewable tablet  Daily        06/17/22 0255    Ambulatory referral to Neurology       Comments: An appointment is requested in approximately: 1 week   06/17/22 0255    aspirin EC 325 MG tablet  Daily,   Status:  Discontinued        06/16/22 2314              Discharge Instructions      You were evaluated in the Emergency Department and after careful evaluation, we did not find any emergent condition requiring admission or further testing in the hospital.  Your exam/testing today was overall reassuring.  Symptoms may have been due to a transient ischemic attack.  Take the 81 mg aspirin daily to prevent stroke and follow-up with the neurologist for further testing.  Please return to the Emergency Department if you experience any worsening of your condition.  Thank you for allowing [place] to be a part of your care.       Elmer Sow. Pilar Plate, MD [facility] Emergency Medicine [facility] [email or web address]    Sabas Sous, MD 06/17/22 [phone]

## 2022-06-16 NOTE — Telephone Encounter (Signed)
Calls the after hours line to report an "incident" earlier in the day where he was shopping at [employer] and had a sudden loss of sensation in his arm and had to "look to make sure it was there." He had return of sensation within a few minutes but then developed loss of sensation and weakness in the ipsilateral leg which prompted him to call the after hours line. He has regained sensation and is generally feeling like himself aside from some chronic weakness that prompted him to see his PCP earlier this week.   Discussed that while he has returned to his baseline, his symptoms are quite concerning for a possible TIA and that he needs to be evaluated urgently. ABCD2 score of 4 indicating a 9.8% 10 day stroke risk.  He is coming to the [facility] by private vehicle.   Eliezer Mccoy, MD

## 2022-06-16 NOTE — ED Provider Notes (Signed)
[place] EMERGENCY DEPARTMENT AT [facility] Provider Note   CSN: [id number] Arrival date & time: 06/16/22  1546     History {Add pertinent medical, surgical, social history, OB history to HPI:1} Chief Complaint  Patient presents with   Stroke Symptoms    Alex [name] is a 67 y.o. male.  HPI Patient has history of hyperlipidemia hypercholesterolemia osteoarthritis TIA  Pt was walking in the store and felt his left arm and the involved the left leg.  His left arm didn't feel like it was there but his left leg felt like pins were in it. He was able to walk but it happened again.  It eventually went away.  It lasted several minutes.  NO trouble with speech or vision.  No other sx now.  Occasionally he has headaches.  He has had some mild chest pain occasionally.  Nothing severe.  Home Medications Prior to Admission medications   Medication Sig Start Date End Date Taking? Authorizing Provider  acetaminophen (TYLENOL) 500 MG tablet Take 500 mg by mouth every 6 (six) hours as needed for moderate pain.    [provider]  allopurinol (ZYLOPRIM) 100 MG tablet Take 1 tablet (100 mg total) by mouth daily. 06/14/22   Lincoln Brigham, MD  amLODipine (NORVASC) 10 MG tablet TAKE 1 TABLET BY MOUTH ONCE DAILY WITH SUPPER 01/09/21   Lilland, Alana, DO  carvedilol (COREG) 25 MG tablet TAKE 1 TABLET BY MOUTH ONCE DAILY WITH SUPPER 07/18/20   Mirian Mo, MD  colchicine 0.6 MG tablet Take 1 tablet (0.6 mg total) by mouth daily. Patient taking differently: Take 0.6 mg by mouth as needed. 11/10/20   Lilland, Alana, DO  glucose blood test strip 1 each by Other route daily. Use as instructed 01/26/14   Glori Luis, MD  irbesartan (AVAPRO) 150 MG tablet Take 1 tablet (150 mg total) by mouth daily. Please make overdue appt with Dr. Clifton James before anymore refills  Thank you 3rd and Final attempt 04/19/21   Kathleene Hazel, MD  naproxen (NAPROSYN) 500 MG tablet TAKE 1 TABLET BY MOUTH  ONCE DAILY AS NEEDED FOR MODERATE PAIN 09/01/21   Lilland, Alana, DO  omeprazole (PRILOSEC) 40 MG capsule TAKE 1 CAPSULE BY MOUTH ONCE DAILY WITH SUPPER 02/21/21   Lilland, Alana, DO  triamcinolone ointment (KENALOG) 0.5 % APPLY TOPICALLY TO AFFECTED AREA AS NEEDED 05/18/22   Lilland, Alana, DO      Allergies    Atorvastatin, Codeine, Crestor [rosuvastatin], and Shellfish allergy    Review of Systems   Review of Systems  Physical Exam Updated Vital Signs BP 130/87   Pulse 78   Temp 98 F (36.7 C)   Resp 17   Ht 1.702 m (5\' 7" )   Wt (!) 146.2 kg   SpO2 98%   BMI 50.48 kg/m  Physical Exam Vitals and nursing note reviewed.  Constitutional:      General: He is not in acute distress.    Appearance: He is well-developed.  HENT:     Head: Normocephalic and atraumatic.     Right Ear: External ear normal.     Left Ear: External ear normal.  Eyes:     General: No visual field deficit or scleral icterus.       Right eye: No discharge.        Left eye: No discharge.     Conjunctiva/sclera: Conjunctivae normal.  Neck:     Trachea: No tracheal deviation.  Cardiovascular:  Rate and Rhythm: Normal rate and regular rhythm.  Pulmonary:     Effort: Pulmonary effort is normal. No respiratory distress.     Breath sounds: Normal breath sounds. No stridor. No wheezing or rales.  Abdominal:     General: Bowel sounds are normal. There is no distension.     Palpations: Abdomen is soft.     Tenderness: There is no abdominal tenderness. There is no guarding or rebound.  Musculoskeletal:        General: No tenderness.     Cervical back: Neck supple.  Skin:    General: Skin is warm and dry.     Findings: No rash.  Neurological:     Mental Status: He is alert and oriented to person, place, and time.     Cranial Nerves: No cranial nerve deficit, dysarthria or facial asymmetry.     Sensory: No sensory deficit.     Motor: No abnormal muscle tone, seizure activity or pronator drift.      Coordination: Coordination normal.     Comments:  able to hold both legs off bed for 5 seconds, sensation intact in all extremities,  no left or right sided neglect, normal finger-nose exam bilaterally, no nystagmus noted   Psychiatric:        Mood and Affect: Mood normal.     ED Results / Procedures / Treatments   Labs (all labs ordered are listed, but only abnormal results are displayed) Labs Reviewed  CBC - Abnormal; Notable for the following components:      Result Value   MCV 79.9 (*)    RDW 15.9 (*)    All other components within normal limits  COMPREHENSIVE METABOLIC PANEL - Abnormal; Notable for the following components:   Glucose, Bld 112 (*)    BUN 24 (*)    Total Protein 8.2 (*)    All other components within normal limits  I-STAT CHEM 8, ED - Abnormal; Notable for the following components:   BUN 28 (*)    Glucose, Bld 104 (*)    Hemoglobin 20.1 (*)    HCT 59.0 (*)    All other components within normal limits  PROTIME-INR  APTT  DIFFERENTIAL  ETHANOL  CBG MONITORING, ED    EKG EKG Interpretation  Date/Time:  Saturday June 16 2022 16:19:53 EDT Ventricular Rate:  78 PR Interval:  213 QRS Duration: 98 QT Interval:  377 QTC Calculation: 430 R Axis:   67 Text Interpretation: Sinus rhythm Borderline prolonged PR interval Low voltage, precordial leads No significant change since last tracing Confirmed by Linwood DibblesKnapp, Waniya Hoglund [phone]([id number]) on 06/16/2022 5:10:07 PM  Radiology CT HEAD WO CONTRAST  Result Date: 06/16/2022 CLINICAL DATA:  Acute stroke.  Left arm and leg numbness. EXAM: CT HEAD WITHOUT CONTRAST TECHNIQUE: Contiguous axial images were obtained from the base of the skull through the vertex without intravenous contrast. RADIATION DOSE REDUCTION: This exam was performed according to the departmental dose-optimization program which includes automated exposure control, adjustment of the mA and/or kV according to patient size and/or use of iterative reconstruction technique.  COMPARISON:  CT head 12/24/2011 FINDINGS: Brain: No evidence of acute infarction, hemorrhage, hydrocephalus, extra-axial collection or mass lesion/mass effect. There is minimal periventricular white matter hypodensity, with a chronic small vessel ischemic change. Vascular: Atherosclerotic calcifications are present within the cavernous internal carotid arteries. Skull: Normal. Negative for fracture or focal lesion. Sinuses/Orbits: Sinuses are clear. Patient is status post sinonasal surgery on the right. Orbits are within normal limits. Other:  None. IMPRESSION: 1. No acute intracranial process. 2. Minimal chronic small vessel ischemic change. Electronically Signed   By: Darliss Cheney M.D.   On: 06/16/2022 18:08    Procedures Procedures  {Document cardiac monitor, telemetry assessment procedure when appropriate:1}  Medications Ordered in ED Medications  sodium chloride flush (NS) 0.9 % injection 3 mL (has no administration in time range)    ED Course/ Medical Decision Making/ A&P Clinical Course as of 06/16/22 1848  Sat Jun 16, 2022  1704 CBC(!) Nl  [JK]  1704 Comprehensive metabolic panel(!) nl [JK]  1846 CT scan without acute abnormality [JK]    Clinical Course User Index [JK] Linwood Dibbles, MD   {   Click here for ABCD2, HEART and other calculatorsREFRESH Note before signing :1}    ABCD2 Score: 1                     Medical Decision Making Amount and/or Complexity of Data Reviewed Labs: ordered. Decision-making details documented in ED Course. Radiology: ordered.   Patient presented to the ED for evaluation of acute numbness.  Symptoms concerning for the possibility of TIA.  He does not have any residual deficits at this time to suggest stroke.  Labs and CT scan without acute findings.  Low risk ABCD score.  {Document critical care time when appropriate:1} {Document review of labs and clinical decision tools ie heart score, Chads2Vasc2 etc:1}  {Document your independent review of  radiology images, and any outside records:1} {Document your discussion with family members, caretakers, and with consultants:1} {Document social determinants of health affecting pt's care:1} {Document your decision making why or why not admission, treatments were needed:1} Final Clinical Impression(s) / ED Diagnoses Final diagnoses:  None    Rx / DC Orders ED Discharge Orders     None

## 2022-06-16 NOTE — ED Notes (Signed)
Updated patient about MRI

## 2022-06-17 ENCOUNTER — Emergency Department (HOSPITAL_COMMUNITY): Payer: Medicare Other

## 2022-06-17 MED ORDER — ASPIRIN 81 MG PO CHEW: 81.0000 mg | CHEWABLE_TABLET | Freq: Every day | ORAL | 2 refills | Status: AC

## 2022-06-17 MED ORDER — IOHEXOL 350 MG/ML SOLN
75.0000 mL | Freq: Once | INTRAVENOUS | Status: AC | PRN
  Administered 2022-06-17: 75 mL via INTRAVENOUS

## 2022-06-17 NOTE — ED Notes (Signed)
Patient transported to CT 

## 2022-06-17 NOTE — ED Notes (Signed)
Received message from MRI tech: Alex [name] is not going through with his mri, he is unable to breathe laying flat /claustrophobic and is refusing to try with medicine .   MD made aware

## 2022-06-25 ENCOUNTER — Encounter: Payer: Self-pay | Admitting: Diagnostic Neuroimaging

## 2022-06-25 ENCOUNTER — Ambulatory Visit: Payer: Medicare Other | Admitting: Diagnostic Neuroimaging

## 2022-06-25 VITALS — BP 138/84 | HR 88 | Ht 67.0 in | Wt 314.0 lb

## 2022-06-25 DIAGNOSIS — G459 Transient cerebral ischemic attack, unspecified: Secondary | ICD-10-CM | POA: Diagnosis not present

## 2022-06-25 NOTE — Patient Instructions (Signed)
TIA (left arm, leg numbness; 20 seconds; 06/15/22) - follow up with PCP and lipid clinic (statin intolerant; consider repatha) - follow up with cardiology (repeat echocardiogram) - continue aspirin, BP control, DM control - continue CPAP for OSA

## 2022-06-25 NOTE — Progress Notes (Signed)
[facility]  PATIENT: Alex [name] DOB: 02-12-1956  REFERRING CLINICIAN: Sabas Sous, MD HISTORY FROM: patient  REASON FOR VISIT: new consult   HISTORICAL  CHIEF COMPLAINT:  Chief Complaint  Patient presents with   New Patient (Initial Visit)    Pt alone, rm 7. He went to the ER April 6 with left numbness on left side of body. He had a old stroke in 2012 that afffected speech. They stated that the CT was negative for new stroke and advised it was likely TIA     HISTORY OF PRESENT ILLNESS:   67 year old male with hypertension, hyperlipidemia, obesity, obstructive sleep apnea, diabetes, coronary artery disease here for evaluation of TIA.  06/16/22 patient had onset of left arm and left leg numbness and pins and needle sensation.  Symptoms lasted 15 to 20 seconds and resolved.  He contacted PCP who told him to go to the emergency room.  Patient went to ER for evaluation and was diagnosed with possible TIA.  Lab testing and CTA of the head and neck were unremarkable.  MRI of the brain was attempted but could not be tolerated due to claustrophobia and shortness of breath.  Since that time patient is doing well.  He is on aspirin 81 mg daily, blood pressure control, but has not been able to tolerate statins or any other lipid medications.  He was referred to lipid clinic in the past but did not follow-up.    REVIEW OF SYSTEMS: Full 14 system review of systems performed and negative with exception of: as per hPI.  ALLERGIES: Allergies  Allergen Reactions   Atorvastatin Other (See Comments)    Subjective myalgias. Gave 2 trials, developed myalgias which resolved after cessation of medication.  Muscle cramps were 9/10 discomfort   Codeine Nausea Only   Crestor [Rosuvastatin]     myalgias   Shellfish Allergy Other (See Comments)    Activates asthma    HOME MEDICATIONS: Outpatient Medications Prior to Visit  Medication Sig Dispense Refill   acetaminophen  (TYLENOL) 500 MG tablet Take 500 mg by mouth every 6 (six) hours as needed for moderate pain.     allopurinol (ZYLOPRIM) 100 MG tablet Take 1 tablet (100 mg total) by mouth daily. 60 tablet 3   amLODipine (NORVASC) 10 MG tablet TAKE 1 TABLET BY MOUTH ONCE DAILY WITH SUPPER 90 tablet 0   aspirin 81 MG chewable tablet Chew 1 tablet (81 mg total) by mouth daily. 30 tablet 2   carvedilol (COREG) 25 MG tablet TAKE 1 TABLET BY MOUTH ONCE DAILY WITH SUPPER 90 tablet 3   colchicine 0.6 MG tablet Take 1 tablet (0.6 mg total) by mouth daily. (Patient taking differently: Take 0.6 mg by mouth daily as needed (gout pain).) 6 tablet 0   glucose blood test strip 1 each by Other route daily. Use as instructed 100 each 12   irbesartan (AVAPRO) 150 MG tablet Take 1 tablet (150 mg total) by mouth daily. Please make overdue appt with Dr. Clifton James before anymore refills  Thank you 3rd and Final attempt 15 tablet 0   naproxen (NAPROSYN) 500 MG tablet TAKE 1 TABLET BY MOUTH ONCE DAILY AS NEEDED FOR MODERATE PAIN 30 tablet 0   omeprazole (PRILOSEC) 40 MG capsule TAKE 1 CAPSULE BY MOUTH ONCE DAILY WITH SUPPER 90 capsule 0   triamcinolone ointment (KENALOG) 0.5 % APPLY TOPICALLY TO AFFECTED AREA AS NEEDED (Patient taking differently: Apply 1 Application topically daily as needed (rash).) 15 g  0   No facility-administered medications prior to visit.    PAST MEDICAL HISTORY: Past Medical History:  Diagnosis Date   Asthma    CAD (coronary artery disease)    Cardiac cath 2002 with 25% distal RCA stenosis.    DDD (degenerative disc disease), lumbar    Diabetes mellitus    Diverticulitis    Esophageal stricture    Gastritis 2010   GERD (gastroesophageal reflux disease)    GI bleed    Gout    HTN (hypertension)    Hyperlipidemia    Insomnia    MI (myocardial infarction) 2009   no stent placement   OA (osteoarthritis)    Obesity    OSA on CPAP    Seasonal allergies    TIA (transient ischemic attack) 2013     PAST SURGICAL HISTORY: Past Surgical History:  Procedure Laterality Date   COLONOSCOPY WITH PROPOFOL N/A 11/11/2018   Procedure: COLONOSCOPY WITH PROPOFOL;  Surgeon: Tressia Danas, MD;  Location: [facility] ENDOSCOPY;  Service: Gastroenterology;  Laterality: N/A;   ESOPHAGOGASTRODUODENOSCOPY     multiple   NASAL SINUS SURGERY     POLYPECTOMY  11/11/2018   Procedure: POLYPECTOMY;  Surgeon: Tressia Danas, MD;  Location: [facility] ENDOSCOPY;  Service: Gastroenterology;;   VASECTOMY      FAMILY HISTORY: Family History  Problem Relation Age of Onset   Heart attack Mother    Heart failure Mother    Colon cancer Neg Hx    Esophageal cancer Neg Hx    Rectal cancer Neg Hx    Stomach cancer Neg Hx     SOCIAL HISTORY: Social History   Socioeconomic History   Marital status: Widowed    Spouse name: Not on file   Number of children: 4   Years of education: 48   Highest education level: Not on file  Occupational History   Occupation: Disability-truck driver  Tobacco Use   Smoking status: Former    Packs/day: 1.50    Years: 10.00    Additional pack years: 0.00    Total pack years: 15.00    Types: Cigarettes    Quit date: 11/06/1991    Years since quitting: 30.6   Smokeless tobacco: Never   Tobacco comments:    quit 1993  Substance and Sexual Activity   Alcohol use: No   Drug use: No   Sexual activity: Not on file  Other Topics Concern   Not on file  Social History Narrative   On disability   [employer]   Quit smoking 20 years ago (as of 01/2012)      Health Care POA:    Emergency Contact: brother, Greig Castilla [phone]   End of Life Plan: gave Ad pamphlet 5/15   Who lives with you: self   Any pets: none   Diet: pt has a varied diet of protein, starch and vegetables.   Exercise: pt does not have regular exercise routine.   Seatbelts: Pt reports wearing seatbelt when in vehicles.    Hobbies: plays bass and keyboard in church music group         Social Determinants of Health    Financial Resource Strain: Not on file  Food Insecurity: Not on file  Transportation Needs: Not on file  Physical Activity: Not on file  Stress: Not on file  Social Connections: Not on file  Intimate Partner Violence: Not on file     PHYSICAL EXAM  GENERAL EXAM/CONSTITUTIONAL: Vitals:  Vitals:   06/25/22 1109  BP: 138/84  Pulse: 88  Weight: (!) 314 lb (142.4 kg)  Height:  (1.702 m)   Body mass index is 49.18 kg/m. Wt Readings from Last 3 Encounters:  06/25/22 (!) 314 lb (142.4 kg)  06/16/22 (!) 322 lb 5 oz (146.2 kg)  06/11/22 (!) 322 lb 6.4 oz (146.2 kg)   Patient is in no distress; well developed, nourished and groomed; neck is supple  CARDIOVASCULAR: Examination of carotid arteries is normal; no carotid bruits Regular rate and rhythm, no murmurs Examination of peripheral vascular system by observation and palpation is normal  EYES: Ophthalmoscopic exam of optic discs and posterior segments is normal; no papilledema or hemorrhages No results found.  MUSCULOSKELETAL: Gait, strength, tone, movements noted in Neurologic exam below  NEUROLOGIC: MENTAL STATUS:     07/16/2013   10:00 AM  MMSE - Mini Mental State Exam  Orientation to time 5  Orientation to Place 5  Registration 3  Attention/ Calculation 5  Recall 3  Language- name 2 objects 2  Language- repeat 1  Language- follow 3 step command 3  Language- read & follow direction 1  Write a sentence 1  Copy design 1  Total score 30   awake, alert, oriented to person, place and time recent and remote memory intact normal attention and concentration language fluent, comprehension intact, naming intact fund of knowledge appropriate  CRANIAL NERVE:  2nd - no papilledema on fundoscopic exam 2nd, 3rd, 4th, 6th - pupils equal and reactive to light, visual fields full to confrontation, extraocular muscles intact, no nystagmus 5th - facial sensation symmetric 7th - facial strength symmetric 8th -  hearing intact 9th - palate elevates symmetrically, uvula midline 11th - shoulder shrug symmetric 12th - tongue protrusion midline  MOTOR:  normal bulk and tone, full strength in the BUE, BLE  SENSORY:  normal and symmetric to light touch, temperature, vibration  COORDINATION:  finger-nose-finger, fine finger movements normal  REFLEXES:  deep tendon reflexes present and symmetric  GAIT/STATION:  narrow based gait     DIAGNOSTIC DATA (LABS, IMAGING, TESTING) - I reviewed patient records, labs, notes, testing and imaging myself where available.  Lab Results  Component Value Date   WBC 9.6 06/16/2022   HGB 20.1 (H) 06/16/2022   HCT 59.0 (H) 06/16/2022   MCV 79.9 (L) 06/16/2022   PLT 304 06/16/2022      Component Value Date/Time   NA 142 06/16/2022 1648   NA 140 06/11/2022 1746   K 4.0 06/16/2022 1648   CL 107 06/16/2022 1648   CO2 23 06/16/2022 1557   GLUCOSE 104 (H) 06/16/2022 1648   BUN 28 (H) 06/16/2022 1648   BUN 18 06/11/2022 1746   CREATININE 1.10 06/16/2022 1648   CREATININE 1.11 09/29/2015 1603   CALCIUM 9.3 06/16/2022 1557   PROT 8.2 (H) 06/16/2022 1557   PROT 7.1 04/11/2018 0949   ALBUMIN 3.7 06/16/2022 1557   ALBUMIN 4.3 04/11/2018 0949   AST 29 06/16/2022 1557   ALT 26 06/16/2022 1557   ALKPHOS 90 06/16/2022 1557   BILITOT 0.8 06/16/2022 1557   BILITOT 0.4 04/11/2018 0949   GFRNONAA >60 06/16/2022 1557   GFRAA 80 04/22/2020 0934   Lab Results  Component Value Date   CHOL 249 (H) 06/11/2022   HDL 30 (L) 06/11/2022   LDLCALC 155 (H) 06/11/2022   LDLDIRECT 130 (H) 02/22/2012   TRIG 339 (H) 06/11/2022   CHOLHDL 8.3 (H) 06/11/2022   Lab Results  Component Value Date   HGBA1C 6.2  06/11/2022   No results found for: "VITAMINB12" Lab Results  Component Value Date   TSH 1.00 09/29/2015    06/17/22 CTA head / neck [I reviewed images myself and agree with interpretation. -VRP]  1. No intracranial large vessel occlusion or significant  stenosis. 2. No hemodynamically significant stenosis in the neck.  07/04/21 TTE  1. Left ventricular ejection fraction, by estimation, is 50 to 55%. The  left ventricle has low normal function. The left ventricle demonstrates  regional wall motion abnormalities (see scoring diagram/findings for  description). There is moderate  concentric left ventricular hypertrophy. Left ventricular diastolic  parameters are consistent with Grade I diastolic dysfunction (impaired  relaxation).   2. Right ventricular systolic function is normal. The right ventricular  size is normal. Tricuspid regurgitation signal is inadequate for assessing  PA pressure.   3. The mitral valve is grossly normal. No evidence of mitral valve  regurgitation.   4. The aortic valve was not well visualized. Aortic valve regurgitation  is not visualized.   5. Aortic small ascending aortic aneurysm 4.5 cm.   6. The inferior vena cava is normal in size with greater than 50%  respiratory variability, suggesting right atrial pressure of 3 mmHg.   Conclusion(s)/Recommendation(s): Recommend yearly surveillance imaging for  small aortic aneurysm.     ASSESSMENT AND PLAN  67 y.o. year old male here with:   Dx:  1. TIA (transient ischemic attack)      PLAN:  TIA (left arm, leg numbness; 20 seconds; 06/15/22) - follow up with PCP and lipid clinic (statin intolerant; consider repatha) - follow up with cardiology (repeat echocardiogram) - continue aspirin, BP control, DM control - continue CPAP for OSA  Orders Placed This Encounter  Procedures   AMB Referral to [facility]   Return for pending if symptoms worsen or fail to improve, return to PCP.  I reviewed images, labs, notes, records myself. I summarized findings and reviewed with patient, for this high risk condition (TIA) requiring high complexity decision making.    Suanne Marker, MD 06/25/2022, 12:16 PM Certified in Neurology,  Neurophysiology and Neuroimaging  [facility] [address] [place], [place] [address] [phone]

## 2022-07-06 ENCOUNTER — Telehealth: Payer: Self-pay | Admitting: Family Medicine

## 2022-07-06 NOTE — Telephone Encounter (Signed)
Contacted Alex [name] to schedule their annual wellness visit. Appointment made for 07/10/2022.  Thank you,  [facility] Direct dial  [phone]

## 2022-07-09 NOTE — Progress Notes (Unsigned)
I connected with  [name] on 07/10/2022 by a audio enabled telemedicine application and verified that I am speaking with the correct person using two identifiers.  Patient Location: Home  Provider Location: Home Office  I discussed the limitations of evaluation and management by telemedicine. The patient expressed understanding and agreed to proceed.   Subjective:   Alex [name] is a 68 y.o. male who presents for Medicare Annual/Subsequent preventive examination.  Review of Systems    Per HPI unless specifically indicated below.  Cardiac Risk Factors include: advanced age (>91men, >52 women);male gender         Objective:       06/25/2022   11:09 AM 06/17/2022    3:00 AM 06/17/2022    2:30 AM  Vitals with BMI  Height 5\' 7"     Weight 314 lbs    BMI 49.17    Systolic 138 122 161  Diastolic 84 91 87  Pulse 88 62 63    Today's Vitals   07/10/22 0903  PainSc: 0-No pain   There is no height or weight on file to calculate BMI.     06/16/2022    4:14 PM 05/09/2020    2:59 PM 04/18/2020    9:00 AM 04/14/2020    6:00 PM 04/14/2020    9:32 AM 04/10/2020    7:00 PM 03/23/2020    2:59 PM  Advanced Directives  Does Patient Have a Medical Advance Directive? No No No No No No No  Would patient like information on creating a medical advance directive?  No - Patient declined No - Patient declined No - Patient declined No - Patient declined Yes (Inpatient - patient requests chaplain consult to create a medical advance directive) No - Patient declined    Current Medications (verified) Outpatient Encounter Medications as of 07/10/2022  Medication Sig   acetaminophen (TYLENOL) 500 MG tablet Take 500 mg by mouth every 6 (six) hours as needed for moderate pain.   allopurinol (ZYLOPRIM) 100 MG tablet Take 1 tablet (100 mg total) by mouth daily.   amLODipine (NORVASC) 10 MG tablet TAKE 1 TABLET BY MOUTH ONCE DAILY WITH SUPPER   aspirin 81 MG chewable tablet Chew 1 tablet (81 mg total)  by mouth daily.   carvedilol (COREG) 25 MG tablet TAKE 1 TABLET BY MOUTH ONCE DAILY WITH SUPPER   glucose blood test strip 1 each by Other route daily. Use as instructed   irbesartan (AVAPRO) 150 MG tablet Take 1 tablet (150 mg total) by mouth daily. Please make overdue appt with Dr. Clifton James before anymore refills  Thank you 3rd and Final attempt   naproxen (NAPROSYN) 500 MG tablet TAKE 1 TABLET BY MOUTH ONCE DAILY AS NEEDED FOR MODERATE PAIN   omeprazole (PRILOSEC) 40 MG capsule TAKE 1 CAPSULE BY MOUTH ONCE DAILY WITH SUPPER   triamcinolone ointment (KENALOG) 0.5 % APPLY TOPICALLY TO AFFECTED AREA AS NEEDED (Patient taking differently: Apply 1 Application topically daily as needed (rash).)   colchicine 0.6 MG tablet Take 1 tablet (0.6 mg total) by mouth daily. (Patient not taking: Reported on 07/10/2022)   No facility-administered encounter medications on file as of 07/10/2022.    Allergies (verified) Atorvastatin, Codeine, Crestor [rosuvastatin], and Shellfish allergy   History: Past Medical History:  Diagnosis Date   Asthma    CAD (coronary artery disease)    Cardiac cath 2002 with 25% distal RCA stenosis.    DDD (degenerative disc disease), lumbar    Diabetes  mellitus (HCC)    Diverticulitis    Esophageal stricture    Gastritis 2010   GERD (gastroesophageal reflux disease)    GI bleed    Gout    HTN (hypertension)    Hyperlipidemia    Insomnia    MI (myocardial infarction) (HCC) 2009   no stent placement   OA (osteoarthritis)    Obesity    OSA on CPAP    Seasonal allergies    TIA (transient ischemic attack) 2013   Past Surgical History:  Procedure Laterality Date   COLONOSCOPY WITH PROPOFOL N/A 11/11/2018   Procedure: COLONOSCOPY WITH PROPOFOL;  Surgeon: Tressia Danas, MD;  Location: [facility] ENDOSCOPY;  Service: Gastroenterology;  Laterality: N/A;   ESOPHAGOGASTRODUODENOSCOPY     multiple   NASAL SINUS SURGERY     POLYPECTOMY  11/11/2018   Procedure: POLYPECTOMY;   Surgeon: Tressia Danas, MD;  Location: [facility] ENDOSCOPY;  Service: Gastroenterology;;   VASECTOMY     Family History  Problem Relation Age of Onset   Heart attack Mother    Heart failure Mother    Colon cancer Neg Hx    Esophageal cancer Neg Hx    Rectal cancer Neg Hx    Stomach cancer Neg Hx    Social History   Socioeconomic History   Marital status: Widowed    Spouse name: Not on file   Number of children: 4   Years of education: 36   Highest education level: Not on file  Occupational History   Occupation: Disability-truck driver  Tobacco Use   Smoking status: Former    Packs/day: 1.50    Years: 10.00    Additional pack years: 0.00    Total pack years: 15.00    Types: Cigarettes    Quit date: 11/06/1991    Years since quitting: 30.6   Smokeless tobacco: Never   Tobacco comments:    quit 1993  Vaping Use   Vaping Use: Never used  Substance and Sexual Activity   Alcohol use: No   Drug use: No   Sexual activity: Not on file  Other Topics Concern   Not on file  Social History Narrative   On disability   [employer]   Quit smoking 20 years ago (as of 01/2012)      Health Care POA:    Emergency Contact: brother, Greig Castilla [phone]   End of Life Plan: gave Ad pamphlet 5/15   Who lives with you: self   Any pets: none   Diet: pt has a varied diet of protein, starch and vegetables.   Exercise: pt does not have regular exercise routine.   Seatbelts: Pt reports wearing seatbelt when in vehicles.    Hobbies: plays bass and keyboard in church music group         Social Determinants of Health   Financial Resource Strain: Low Risk  (07/10/2022)   Overall Financial Resource Strain (CARDIA)    Difficulty of Paying Living Expenses: Not hard at all  Food Insecurity: No Food Insecurity (07/10/2022)   Hunger Vital Sign    Worried About Running Out of Food in the Last Year: Never true    Ran Out of Food in the Last Year: Never true  Transportation Needs: No Transportation Needs  (07/10/2022)   PRAPARE - Administrator, Civil Service (Medical): No    Lack of Transportation (Non-Medical): No  Physical Activity: Inactive (07/10/2022)   Exercise Vital Sign    Days of Exercise per Week: 0 days  Minutes of Exercise per Session: 0 min  Stress: No Stress Concern Present (07/10/2022)   [employer] of Occupational Health - Occupational Stress Questionnaire    Feeling of Stress : Not at all  Social Connections: Moderately Integrated (07/10/2022)   Social Connection and Isolation Panel [NHANES]    Frequency of Communication with Friends and Family: More than three times a week    Frequency of Social Gatherings with Friends and Family: More than three times a week    Attends Religious Services: More than 4 times per year    Active Member of [employer] or Organizations: Yes    Attends Banker Meetings: More than 4 times per year    Marital Status: Widowed    Tobacco Counseling Counseling given: No Tobacco comments: quit 1993   Clinical Intake:  Pre-visit preparation completed: No  Pain : No/denies pain Pain Score: 0-No pain     Nutritional Status: BMI > 30  Obese Nutritional Risks: None Diabetes: Yes CBG done?: No Did pt. bring in CBG monitor from home?: No  How often do you need to have someone help you when you read instructions, pamphlets, or other written materials from your doctor or pharmacy?: 1 - Never  Diabetic?Nutrition Risk Assessment:  Has the patient had any N/V/D within the last 2 months?  No  Does the patient have any non-healing wounds?  No  Has the patient had any unintentional weight loss or weight gain?  No   Diabetes:  Is the patient diabetic?  Yes  If diabetic, was a CBG obtained today?  Yes  Did the patient bring in their glucometer from home?  No  How often do you monitor your CBG's? Once month .   Financial Strains and Diabetes Management:  Are you having any financial strains with the device, your  supplies or your medication? No .  Does the patient want to be seen by Chronic Care Management for management of their diabetes?  No  Would the patient like to be referred to a Nutritionist or for Diabetic Management?  No   Diabetic Exams:  Diabetic Eye Exam: Overdue for diabetic eye exam. Pt has been advised about the importance in completing this exam. Patient advised to call and schedule an eye exam. Diabetic Foot Exam: Overdue, Pt has been advised about the importance in completing this exam. Pt is scheduled for diabetic foot exam on next diabetic appointment .   Interpreter Needed?: No  Information entered by :: Laurel Dimmer, CMA   Activities of Daily Living    07/10/2022    9:01 AM  In your present state of health, do you have any difficulty performing the following activities:  Hearing? 0  Vision? 1  Comment Dr. Elmer Picker  Difficulty concentrating or making decisions? 0  Walking or climbing stairs? 1  Dressing or bathing? 0  Doing errands, shopping? 0    Patient Care Team: Lincoln Brigham, MD as PCP - General (Family Medicine) Kathleene Hazel, MD as PCP - Cardiology (Cardiology) Everrett Coombe, DO as Referring Physician (Family Medicine) Kennon Rounds (Physician Assistant) Iva Boop, MD (Gastroenterology)  Indicate any recent Medical Services you may have received from other than [facility] providers in the past year (date may be approximate).     Assessment:   This is a routine wellness examination for Ravin.  Hearing/Vision screen Denies any hearing issues. Denies any change to her vision. Annual Eye Exam.  Dietary issues and exercise activities discussed: Current Exercise Habits:  The patient does not participate in regular exercise at present, Exercise limited by: None identified   Goals Addressed   None    Depression Screen    07/10/2022    9:00 AM 08/01/2020    8:39 AM 05/09/2020    3:06 PM 05/09/2020    2:58 PM 04/18/2020    9:00 AM 04/14/2020     9:32 AM 09/21/2019    8:36 AM  PHQ 2/9 Scores  PHQ - 2 Score 0 0 0 0 0 0 0  PHQ- 9 Score  0 0 0 0 0     Fall Risk    07/10/2022    9:01 AM 06/11/2022    1:53 PM 08/01/2020    8:39 AM 05/09/2020    2:58 PM 04/14/2020    9:32 AM  Fall Risk   Falls in the past year? 1 1 0 0 0  Number falls in past yr: 0 0 0 0 0  Injury with Fall? 0 0 0 0 0  Risk for fall due to : No Fall Risks      Follow up Falls evaluation completed        FALL RISK PREVENTION PERTAINING TO THE HOME:  Any stairs in or around the home? No  If so, are there any without handrails? No  Home free of loose throw rugs in walkways, pet beds, electrical cords, etc? Yes  Adequate lighting in your home to reduce risk of falls? Yes   ASSISTIVE DEVICES UTILIZED TO PREVENT FALLS:  Life alert? No  Use of a cane, walker or w/c? Yes  Grab bars in the bathroom? No  Shower chair or bench in shower? No  Elevated toilet seat or a handicapped toilet? No   TIMED UP AND GO:  Was the test performed? Unable to perform, virtual appointment    Cognitive Function:    07/16/2013   10:00 AM  MMSE - Mini Mental State Exam  Orientation to time 5  Orientation to Place 5  Registration 3  Attention/ Calculation 5  Recall 3  Language- name 2 objects 2  Language- repeat 1  Language- follow 3 step command 3  Language- read & follow direction 1  Write a sentence 1  Copy design 1  Total score 30        07/10/2022    9:07 AM  6CIT Screen  What Year? 0 points  What month? 0 points  What time? 0 points  Count back from 20 0 points  Months in reverse 0 points  Repeat phrase 0 points  Total Score 0 points    Immunizations Immunization History  Administered Date(s) Administered   Hepatitis B, ADULT 08/17/2016, 10/17/2016   Influenza Whole 03/01/2008, 12/29/2008   Influenza,inj,Quad PF,6+ Mos 01/01/2013, 04/01/2015, 01/20/2016, 02/07/2017, 12/22/2018   PFIZER(Purple Top)SARS-COV-2 Vaccination 03/23/2020   Pneumococcal  Polysaccharide-23 06/23/2008   Td 03/12/2006   Tdap 05/09/2020    TDAP status: Up to date  Flu Vaccine status: Up to date  Pneumococcal vaccine status: Due, Education has been provided regarding the importance of this vaccine. Advised may receive this vaccine at local pharmacy or Health Dept. Aware to provide a copy of the vaccination record if obtained from local pharmacy or Health Dept. Verbalized acceptance and understanding.  Covid-19 vaccine status: Information provided on how to obtain vaccines.   Qualifies for Shingles Vaccine? Yes   Zostavax completed No   Shingrix Completed?: No.    Education has been provided regarding the importance of this vaccine.  Patient has been advised to call insurance company to determine out of pocket expense if they have not yet received this vaccine. Advised may also receive vaccine at local pharmacy or Health Dept. Verbalized acceptance and understanding.  Screening Tests Health Maintenance  Topic Date Due   Zoster Vaccines- Shingrix (1 of 2) Never done   COVID-19 Vaccine (2 - Pfizer risk series) 04/13/2020   Pneumonia Vaccine 40+ Years old (2 of 2 - PCV) 04/28/2020   FOOT EXAM  05/25/2020   OPHTHALMOLOGY EXAM  02/08/2022   INFLUENZA VACCINE  10/11/2022   HEMOGLOBIN A1C  12/11/2022   Diabetic kidney evaluation - Urine ACR  06/11/2023   Diabetic kidney evaluation - eGFR measurement  06/16/2023   Medicare Annual Wellness (AWV)  07/10/2023   COLONOSCOPY (Pts 45-61yrs Insurance coverage will need to be confirmed)  11/10/2028   DTaP/Tdap/Td (3 - Td or Tdap) 05/09/2030   Hepatitis C Screening  Completed   HPV VACCINES  Aged Out    Health Maintenance  Health Maintenance Due  Topic Date Due   Zoster Vaccines- Shingrix (1 of 2) Never done   COVID-19 Vaccine (2 - Pfizer risk series) 04/13/2020   Pneumonia Vaccine 82+ Years old (2 of 2 - PCV) 04/28/2020   FOOT EXAM  05/25/2020   OPHTHALMOLOGY EXAM  02/08/2022    Colorectal cancer screening:  Type of screening: Colonoscopy. Completed 11/11/2018. Repeat every 10 years  Lung Cancer Screening: (Low Dose CT Chest recommended if Age 21-80 years, 30 pack-year currently smoking OR have quit w/in 15years.) does not qualify.   Lung Cancer Screening Referral: not applicable   Additional Screening:  Hepatitis C Screening: does qualify; Completed 08/14/2016  Vision Screening: Recommended annual ophthalmology exams for early detection of glaucoma and other disorders of the eye. Is the patient up to date with their annual eye exam?  No Who is the provider or what is the name of the office in which the patient attends annual eye exams? Dr. Elmer Picker  If pt is not established with a provider, would they like to be referred to a provider to establish care? No .   Dental Screening: Recommended annual dental exams for proper oral hygiene  Community Resource Referral / Chronic Care Management: CRR required this visit?  No   CCM required this visit?  No      Plan:     I have personally reviewed and noted the following in the patient's chart:   Medical and social history Use of alcohol, tobacco or illicit drugs  Current medications and supplements including opioid prescriptions. Patient is not currently taking opioid prescriptions. Functional ability and status Nutritional status Physical activity Advanced directives List of other physicians Hospitalizations, surgeries, and ER visits in previous 12 months Vitals Screenings to include cognitive, depression, and falls Referrals and appointments  In addition, I have reviewed and discussed with patient certain preventive protocols, quality metrics, and best practice recommendations. A written personalized care plan for preventive services as well as general preventive health recommendations were provided to patient.    Mr. Dumond , Thank you for taking time to come for your Medicare Wellness Visit. I appreciate your ongoing commitment to  your health goals. Please review the following plan we discussed and let me know if I can assist you in the future.   These are the goals we discussed:  Goals   None     This is a list of the screening recommended for you and due dates:  Health Maintenance  Topic Date Due   Zoster (Shingles) Vaccine (1 of 2) Never done   COVID-19 Vaccine (2 - Pfizer risk series) 04/13/2020   Pneumonia Vaccine (2 of 2 - PCV) 04/28/2020   Complete foot exam   05/25/2020   Eye exam for diabetics  02/08/2022   Flu Shot  10/11/2022   Hemoglobin A1C  12/11/2022   Yearly kidney health urinalysis for diabetes  06/11/2023   Yearly kidney function blood test for diabetes  06/16/2023   Medicare Annual Wellness Visit  07/10/2023   Colon Cancer Screening  11/10/2028   DTaP/Tdap/Td vaccine (3 - Td or Tdap) 05/09/2030   Hepatitis C Screening: USPSTF Recommendation to screen - Ages 27-79 yo.  Completed   HPV Vaccine  Aged [address], [place]   07/10/2022  Nurse Notes: Approximately 30 minute Non-Face -To-Face Medicare Wellness Visit

## 2022-07-09 NOTE — Patient Instructions (Signed)
Health Maintenance, Male Adopting a healthy lifestyle and getting preventive care are important in promoting health and wellness. Ask your health care provider about: The right schedule for you to have regular tests and exams. Things you can do on your own to prevent diseases and keep yourself healthy. What should I know about diet, weight, and exercise? Eat a healthy diet  Eat a diet that includes plenty of vegetables, fruits, low-fat dairy products, and lean protein. Do not eat a lot of foods that are high in solid fats, added sugars, or sodium. Maintain a healthy weight Body mass index (BMI) is a measurement that can be used to identify possible weight problems. It estimates body fat based on height and weight. Your health care provider can help determine your BMI and help you achieve or maintain a healthy weight. Get regular exercise Get regular exercise. This is one of the most important things you can do for your health. Most adults should: Exercise for at least 150 minutes each week. The exercise should increase your heart rate and make you sweat (moderate-intensity exercise). Do strengthening exercises at least twice a week. This is in addition to the moderate-intensity exercise. Spend less time sitting. Even light physical activity can be beneficial. Watch cholesterol and blood lipids Have your blood tested for lipids and cholesterol at 67 years of age, then have this test every 5 years. You may need to have your cholesterol levels checked more often if: Your lipid or cholesterol levels are high. You are older than 67 years of age. You are at high risk for heart disease. What should I know about cancer screening? Many types of cancers can be detected early and may often be prevented. Depending on your health history and family history, you may need to have cancer screening at various ages. This may include screening for: Colorectal cancer. Prostate cancer. Skin cancer. Lung  cancer. What should I know about heart disease, diabetes, and high blood pressure? Blood pressure and heart disease High blood pressure causes heart disease and increases the risk of stroke. This is more likely to develop in people who have high blood pressure readings or are overweight. Talk with your health care provider about your target blood pressure readings. Have your blood pressure checked: Every 3-5 years if you are 18-39 years of age. Every year if you are 40 years old or older. If you are between the ages of 65 and 75 and are a current or former smoker, ask your health care provider if you should have a one-time screening for abdominal aortic aneurysm (AAA). Diabetes Have regular diabetes screenings. This checks your fasting blood sugar level. Have the screening done: Once every three years after age 45 if you are at a normal weight and have a low risk for diabetes. More often and at a younger age if you are overweight or have a high risk for diabetes. What should I know about preventing infection? Hepatitis B If you have a higher risk for hepatitis B, you should be screened for this virus. Talk with your health care provider to find out if you are at risk for hepatitis B infection. Hepatitis C Blood testing is recommended for: Everyone born from 1945 through 1965. Anyone with known risk factors for hepatitis C. Sexually transmitted infections (STIs) You should be screened each year for STIs, including gonorrhea and chlamydia, if: You are sexually active and are younger than 67 years of age. You are older than 67 years of age and your   health care provider tells you that you are at risk for this type of infection. Your sexual activity has changed since you were last screened, and you are at increased risk for chlamydia or gonorrhea. Ask your health care provider if you are at risk. Ask your health care provider about whether you are at high risk for HIV. Your health care provider  may recommend a prescription medicine to help prevent HIV infection. If you choose to take medicine to prevent HIV, you should first get tested for HIV. You should then be tested every 3 months for as long as you are taking the medicine. Follow these instructions at home: Alcohol use Do not drink alcohol if your health care provider tells you not to drink. If you drink alcohol: Limit how much you have to 0-2 drinks a day. Know how much alcohol is in your drink. In the [place] one drink equals one 12 oz bottle of beer (355 mL), one 5 oz glass of wine (148 mL), or one 1 oz glass of hard liquor (44 mL). Lifestyle Do not use any products that contain nicotine or tobacco. These products include cigarettes, chewing tobacco, and vaping devices, such as e-cigarettes. If you need help quitting, ask your health care provider. Do not use street drugs. Do not share needles. Ask your health care provider for help if you need support or information about quitting drugs. General instructions Schedule regular health, dental, and eye exams. Stay current with your vaccines. Tell your health care provider if: You often feel depressed. You have ever been abused or do not feel safe at home. Summary Adopting a healthy lifestyle and getting preventive care are important in promoting health and wellness. Follow your health care provider's instructions about healthy diet, exercising, and getting tested or screened for diseases. Follow your health care provider's instructions on monitoring your cholesterol and blood pressure. This information is not intended to replace advice given to you by your health care provider. Make sure you discuss any questions you have with your health care provider. Document Revised: 07/18/2020 Document Reviewed: 07/18/2020 Elsevier Patient Education  2023 Elsevier Inc.  

## 2022-07-10 ENCOUNTER — Ambulatory Visit (INDEPENDENT_AMBULATORY_CARE_PROVIDER_SITE_OTHER): Payer: Medicare Other

## 2022-07-10 DIAGNOSIS — Z0000 Encounter for general adult medical examination without abnormal findings: Secondary | ICD-10-CM

## 2022-07-12 ENCOUNTER — Ambulatory Visit: Payer: Self-pay | Admitting: Family Medicine

## 2022-07-12 ENCOUNTER — Ambulatory Visit (INDEPENDENT_AMBULATORY_CARE_PROVIDER_SITE_OTHER): Payer: Medicare Other | Admitting: Family Medicine

## 2022-07-12 ENCOUNTER — Other Ambulatory Visit: Payer: Self-pay

## 2022-07-12 VITALS — BP 133/102 | HR 79 | Ht 67.0 in | Wt 318.2 lb

## 2022-07-12 DIAGNOSIS — M1A0290 Idiopathic chronic gout, unspecified elbow, without tophus (tophi): Secondary | ICD-10-CM

## 2022-07-12 NOTE — Patient Instructions (Addendum)
It was nice seeing you today!  Remember to take the allopurinol every day.  Remember to schedule your diabetic eye exam.  Stay well, Alex Deeds, MD [facility] [phone]  --  Make sure to check out at the front desk before you leave today.  Please arrive at least 15 minutes prior to your scheduled appointments.  If you had blood work today, I will send you a MyChart message or a letter if results are normal. Otherwise, I will give you a call.  If you had a referral placed, they will call you to set up an appointment. Please give [place] a call if you don't hear back in the next 2 weeks.  If you need additional refills before your next appointment, please call your pharmacy first.

## 2022-07-12 NOTE — Progress Notes (Signed)
    SUBJECTIVE:   CHIEF COMPLAINT / HPI:  Chief Complaint  Patient presents with   Gout    Pt is F/U for gout his joints feel ok but every now and then it hurts to move.   Coronary Artery Disease    Pt is complaining of having chest pains here and there. He was in the ED for a mini stroke, Pt describes heart pain as muscle spasms or ache     Reports he has been taking his allopurinol less than 50% of the days. He reports it makes him feel bad. Reports he had a gout flare-up 1 week ago in his elbow, resolved with naproxen.  He reports he takes the naproxen very sparingly.  Patient reports that he has had intermittent chest discomfort ongoing for years.  He reports he has not had any change to this pain recently.  No chest pain currently.  Patient was recently seen in the ED for TIA.  He did follow-up with neurology since last office visit here.  He has upcoming appointment in lipid clinic due to multiple statin intolerances.  PERTINENT  PMH / PSH: CAD, HTN, gout, chronic anemia, TIA  Patient Care Team: Lincoln Brigham, MD as PCP - General (Family Medicine) Kathleene Hazel, MD as PCP - Cardiology (Cardiology) Everrett Coombe, DO as Referring Physician (Family Medicine) Kennon Rounds (Physician Assistant) Iva Boop, MD (Gastroenterology)   OBJECTIVE:   BP (!) 145/91   Pulse 75   Ht 5\' 7"  (1.702 m)   Wt (!) 318 lb 3.2 oz (144.3 kg)   SpO2 94%   BMI 49.84 kg/m   Physical Exam Constitutional:      General: He is not in acute distress. Cardiovascular:     Rate and Rhythm: Normal rate and regular rhythm.  Pulmonary:     Effort: Pulmonary effort is normal. No respiratory distress.     Breath sounds: Normal breath sounds.  Neurological:     Mental Status: He is alert.         07/12/2022    2:35 PM  Depression screen PHQ 2/9  Decreased Interest 2  Down, Depressed, Hopeless 0  PHQ - 2 Score 2  Altered sleeping 2  Tired, decreased energy 2  Change in  appetite 2  Feeling bad or failure about yourself  2  Trouble concentrating 0  Moving slowly or fidgety/restless 0  Suicidal thoughts 0  PHQ-9 Score 10     {Show previous vital signs (optional):23777}    ASSESSMENT/PLAN:   Gout Advised to take the allopurinol consistently.  Defer checking uric acid today since he has not taking allopurinol consistently.   HCM - patient will schedule DM eye exam  Return in about 2 months (around 09/11/2022) for f/u diabetes, gout.   Littie Deeds, MD [facility]

## 2022-07-12 NOTE — Assessment & Plan Note (Signed)
Advised to take the allopurinol consistently.  Defer checking uric acid today since he has not taking allopurinol consistently.

## 2022-07-18 ENCOUNTER — Other Ambulatory Visit (HOSPITAL_COMMUNITY): Payer: Self-pay

## 2022-07-18 ENCOUNTER — Telehealth: Payer: Self-pay

## 2022-07-18 ENCOUNTER — Ambulatory Visit: Payer: Medicare Other | Attending: Cardiology | Admitting: Pharmacist

## 2022-07-18 VITALS — BP 126/83 | HR 72 | Ht 67.0 in | Wt 316.6 lb

## 2022-07-18 DIAGNOSIS — Z7985 Long-term (current) use of injectable non-insulin antidiabetic drugs: Secondary | ICD-10-CM

## 2022-07-18 DIAGNOSIS — E7800 Pure hypercholesterolemia, unspecified: Secondary | ICD-10-CM

## 2022-07-18 DIAGNOSIS — E785 Hyperlipidemia, unspecified: Secondary | ICD-10-CM

## 2022-07-18 DIAGNOSIS — I2510 Atherosclerotic heart disease of native coronary artery without angina pectoris: Secondary | ICD-10-CM

## 2022-07-18 DIAGNOSIS — E119 Type 2 diabetes mellitus without complications: Secondary | ICD-10-CM

## 2022-07-18 DIAGNOSIS — I7121 Aneurysm of the ascending aorta, without rupture: Secondary | ICD-10-CM

## 2022-07-18 DIAGNOSIS — Z8673 Personal history of transient ischemic attack (TIA), and cerebral infarction without residual deficits: Secondary | ICD-10-CM

## 2022-07-18 MED ORDER — REPATHA SURECLICK 140 MG/ML ~~LOC~~ SOAJ
1.0000 mL | SUBCUTANEOUS | 6 refills | Status: DC
Start: 2022-07-18 — End: 2023-01-21

## 2022-07-18 NOTE — Telephone Encounter (Signed)
Pharmacy Patient Advocate Encounter  Prior Authorization for Repatha 140mg /ml has been approved by [employer] (ins).    KEY# [id number] Effective dates: 5.8.24 through 11.8.24

## 2022-07-18 NOTE — Patient Instructions (Addendum)
It was nice seeing you again  We would like your LDL (bad cholesterol) to be less than 55  The medication we are recommending is called Repatha, which you would inject once every 2 weeks  I will complete the prior authorization for you and we will let you know when it is approved  Once you start the medication we will recheck your cholesterol levels in about 2-3 months  Please message with any questions  Laural Golden, PharmD, BCACP, CDCES, CPP [address], [address] [place], [place], [address] Phone: [phone], Fax: [phone]

## 2022-07-18 NOTE — Progress Notes (Signed)
Patient ID: Alex [name]                 DOB: Jul 28, 1955                    MRN: [id number]     HPI: Alex [name] is a 67 y.o. male patient referred to lipid clinic by Dr Clifton James and his neurologist Dr Marjory Lies. PMH is significant for TIA (most recently on 06/16/22), diastolic HF, CAD, HTN, OSA, obesity, T2DM, and statin intolerance. Patient has been previously referred to lipid clinic and resistant to starting new medications.  Patient presents today to discuss cholesterol. Previously tried atorvastatin, rosuvastatin, and ezetimibe and had myalgias to all. Does not want to use an injectable medication.  Current Medications: N/A  Intolerances:  Atorvastatin Rosuvastatin Zetia  Risk Factors:  CHF T2DM CAD Hx of TIA Hx of smoking  LDL goal: <55  Labs: TC 249, Trigs 339, HDL 30, LDL 155 (06/11/22 - not on any meds)  Past Medical History:  Diagnosis Date   Asthma    CAD (coronary artery disease)    Cardiac cath 2002 with 25% distal RCA stenosis.    DDD (degenerative disc disease), lumbar    Diabetes mellitus (HCC)    Diverticulitis    Esophageal stricture    Gastritis 2010   GERD (gastroesophageal reflux disease)    GI bleed    Gout    HTN (hypertension)    Hyperlipidemia    Insomnia    MI (myocardial infarction) (HCC) 2009   no stent placement   OA (osteoarthritis)    Obesity    OSA on CPAP    Seasonal allergies    TIA (transient ischemic attack) 2013    Current Outpatient Medications on File Prior to Visit  Medication Sig Dispense Refill   acetaminophen (TYLENOL) 500 MG tablet Take 500 mg by mouth every 6 (six) hours as needed for moderate pain.     allopurinol (ZYLOPRIM) 100 MG tablet Take 1 tablet (100 mg total) by mouth daily. 60 tablet 3   amLODipine (NORVASC) 10 MG tablet TAKE 1 TABLET BY MOUTH ONCE DAILY WITH SUPPER 90 tablet 0   aspirin 81 MG chewable tablet Chew 1 tablet (81 mg total) by mouth daily. 30 tablet 2   carvedilol (COREG) 25 MG tablet TAKE 1  TABLET BY MOUTH ONCE DAILY WITH SUPPER 90 tablet 3   colchicine 0.6 MG tablet Take 1 tablet (0.6 mg total) by mouth daily. 6 tablet 0   glucose blood test strip 1 each by Other route daily. Use as instructed 100 each 12   irbesartan (AVAPRO) 150 MG tablet Take 1 tablet (150 mg total) by mouth daily. Please make overdue appt with Dr. Clifton James before anymore refills  Thank you 3rd and Final attempt 15 tablet 0   naproxen (NAPROSYN) 500 MG tablet TAKE 1 TABLET BY MOUTH ONCE DAILY AS NEEDED FOR MODERATE PAIN 30 tablet 0   omeprazole (PRILOSEC) 40 MG capsule TAKE 1 CAPSULE BY MOUTH ONCE DAILY WITH SUPPER 90 capsule 0   triamcinolone ointment (KENALOG) 0.5 % APPLY TOPICALLY TO AFFECTED AREA AS NEEDED 15 g 0   No current facility-administered medications on file prior to visit.    Allergies  Allergen Reactions   Atorvastatin Other (See Comments)    Subjective myalgias. Gave 2 trials, developed myalgias which resolved after cessation of medication.  Muscle cramps were 9/10 discomfort   Codeine Nausea Only   Crestor [Rosuvastatin]  myalgias   Shellfish Allergy Other (See Comments)    Activates asthma    Assessment/Plan:  1. Hyperlipidemia - Patient;s LDL 155 which is well above goal of <55. However is not on any medications. Prefers to try another oral agent. Advised we did not have any other agents that he has not tried yet that will get him to goal.  Described differences between Repatha and Leqvio and said both would be an ideal choice. Patient prefers to try Repatha. Using demo pen, educated patient on mechanism of action, storage, site selection, administration, and possible adverse effects. Will complete PA and contact when approved. Recheck lipid panel in 2-3 months.  Start Repatha 140mg  q 2 weeks Recheck lipid panel in 2-3 months  Laural Golden, PharmD, BCACP, CDCES, CPP [address], [address] [place], [place], [address] Phone: [phone], Fax: [phone]

## 2022-07-18 NOTE — Telephone Encounter (Signed)
Pharmacy Patient Advocate Encounter   Received notification from [employer] that prior authorization for Repatha 140mg /ml is required/requested.   PA submitted on 5.8.24 to (ins) [employer] via CoverMyMeds  Key or (Medicaid) confirmation # [id number]  Status is pending

## 2022-09-11 ENCOUNTER — Other Ambulatory Visit: Payer: Self-pay | Admitting: Family Medicine

## 2022-09-21 ENCOUNTER — Telehealth: Payer: Self-pay | Admitting: Pharmacist Clinician (PhC)/ Clinical Pharmacy Specialist

## 2022-09-21 NOTE — Telephone Encounter (Signed)
Patient called to office - notes that he has developed dizziness since starting repatha.  Notes that the first time it was 10+ days after the injection before it happened, but states that has been happening more frequently since.     Advised that it doesn't sound like Repatha side effect, but he could stop the medication for 2 doses and see if the dizziness resolves.  If so, would be best to re-challenge and see if symptoms return.    Patient agreeable to plan, will call back in 1-2 months to let us know.  Will postpone labs until we determine.

## 2022-10-16 ENCOUNTER — Other Ambulatory Visit: Payer: Self-pay | Admitting: Family Medicine

## 2022-11-12 NOTE — Progress Notes (Signed)
    SUBJECTIVE:   CHIEF COMPLAINT / HPI:   Alex [name] is a 67yo M w/ hx of OSA, HFpEF, HTN, CAD, T2DM that p/w covid.  COVID - 1wk ago, had sore throat, dizziness, congestion. Friday, took theraflu and felt like it helped. On Sunday, took a test for Covid and it was positive. Reports going to church services prior to symptoms onset. Other people at the service also got covid.  - Still feels cloudy. Overall feels like he's improving.  - No SOB or fevers anymore.  HLD - Reports taking a break from Repatha, feels like it maybe improved the dizziness, but then restarted because he felt better. - Reports that he has been consistently taking it for the past 1 month.  - Per chart review of cardiology, was instructed to stop repatha and then restart after a few weeks to see if it was contributing to his dizziness. Then was supposed to f/u 1-2 months w/ Cards.   OBJECTIVE:   BP (!) 132/92   Pulse 74   Ht 5\' 7"  (1.702 m)   Wt (!) 320 lb (145.2 kg)   SpO2 100%   BMI 50.12 kg/m   General: Alert, pleasant man. NAD. HEENT: NCAT. MMM. CV: RRR, no murmurs.  Resp: CTAB, no wheezing or crackles. Normal WOB on RA.  Abm: Soft, nontender, nondistended. BS present. Ext: Moves all ext spontaneously Skin: Warm, well perfused   ASSESSMENT/PLAN:   COVID Hx and symptoms c/w 1wk of covid URI. Reassuringly, VSS, symptoms improving, no SOB. Pt is recovering well from infxn.   Coronary artery disease Started on Repatha by Cards, but took a break due to c/f dizziness, which has now improved. Pt has restarted taking Repatha for ~30month.  - Advised pt to f/u with Cardiology for further management of Repatha.  Need for shingles vaccine Discussed need for shingles, PCV, flu, and covid vaccine. - shingrix x2doses ordered - also discussed flu and covid and PCV vaccine, but pt declines at this time.   Diabetes mellitus without complication (HCC) A1c at goal. Diet controlled   Lincoln Brigham, MD [facility]  [facility]

## 2022-11-13 ENCOUNTER — Ambulatory Visit (INDEPENDENT_AMBULATORY_CARE_PROVIDER_SITE_OTHER): Payer: Medicare Other | Admitting: Family Medicine

## 2022-11-13 VITALS — BP 132/92 | HR 74 | Ht 67.0 in | Wt 320.0 lb

## 2022-11-13 DIAGNOSIS — U071 COVID-19: Secondary | ICD-10-CM

## 2022-11-13 DIAGNOSIS — Z23 Encounter for immunization: Secondary | ICD-10-CM

## 2022-11-13 DIAGNOSIS — I2510 Atherosclerotic heart disease of native coronary artery without angina pectoris: Secondary | ICD-10-CM

## 2022-11-13 DIAGNOSIS — E119 Type 2 diabetes mellitus without complications: Secondary | ICD-10-CM | POA: Diagnosis not present

## 2022-11-13 LAB — POCT GLYCOSYLATED HEMOGLOBIN (HGB A1C): HbA1c, POC (controlled diabetic range): 6.4 % (ref 0.0–7.0)

## 2022-11-13 MED ORDER — ZOSTER VAC RECOMB ADJUVANTED 50 MCG/0.5ML IM SUSR
0.5000 mL | Freq: Once | INTRAMUSCULAR | 1 refills | Status: AC
Start: 2022-11-13 — End: 2022-11-13

## 2022-11-13 NOTE — Patient Instructions (Signed)
Good to see you today - Thank you for coming in  Things we discussed today:  1) For your cholesterol - Continue taking your Repatha - Follow-up with your Cardiologist in the next month to recheck your cholesterol  2) You seem to recovering well from COVID.  - Expect your symptoms to improve over the next 7 days - You can still have a lingering cough for the next 6-8 weeks and this is normal - You can take tylenol as needed for discomfort from the infection  3) I am sending a prescription for the Shingles vaccine to your pharmacy. Go there to get your shot. - It is 1 shot not, and then a second dose 2-6 months later - You can take tylenol as needed after your shots to prevent soreness and discomfort - I also highly recommend getting your flu and covid vaccine this fall.  Come back to see me in 6 months for follow-up

## 2022-11-14 DIAGNOSIS — Z23 Encounter for immunization: Secondary | ICD-10-CM | POA: Insufficient documentation

## 2022-11-14 DIAGNOSIS — U071 COVID-19: Secondary | ICD-10-CM | POA: Insufficient documentation

## 2022-11-14 NOTE — Assessment & Plan Note (Signed)
Started on Repatha by Cards, but took a break due to c/f dizziness, which has now improved. Pt has restarted taking Repatha for ~42month.  - Advised pt to f/u with Cardiology for further management of Repatha.

## 2022-11-14 NOTE — Assessment & Plan Note (Signed)
Discussed need for shingles, PCV, flu, and covid vaccine. - shingrix x2doses ordered - also discussed flu and covid and PCV vaccine, but pt declines at this time.

## 2022-11-14 NOTE — Assessment & Plan Note (Signed)
A1c at goal. Diet controlled

## 2022-11-14 NOTE — Assessment & Plan Note (Signed)
Hx and symptoms c/w 1wk of covid URI. Reassuringly, VSS, symptoms improving, no SOB. Pt is recovering well from infxn.

## 2022-11-16 ENCOUNTER — Encounter: Payer: Self-pay | Admitting: Family Medicine

## 2023-01-20 ENCOUNTER — Other Ambulatory Visit: Payer: Self-pay | Admitting: Cardiovascular Disease

## 2023-01-20 DIAGNOSIS — Z8673 Personal history of transient ischemic attack (TIA), and cerebral infarction without residual deficits: Secondary | ICD-10-CM

## 2023-01-20 DIAGNOSIS — E785 Hyperlipidemia, unspecified: Secondary | ICD-10-CM

## 2023-01-20 DIAGNOSIS — E7800 Pure hypercholesterolemia, unspecified: Secondary | ICD-10-CM

## 2023-02-08 ENCOUNTER — Other Ambulatory Visit (HOSPITAL_COMMUNITY): Payer: Self-pay

## 2023-02-08 ENCOUNTER — Telehealth: Payer: Self-pay | Admitting: Pharmacy Technician

## 2023-02-08 NOTE — Telephone Encounter (Signed)
Pharmacy Patient Advocate Encounter   Received notification from CoverMyMeds that prior authorization for repatha is required/requested.   Insurance verification completed.   The patient is insured through [facility] .   Per test claim: PA required; PA submitted to above mentioned insurance via CoverMyMeds Key/confirmation #/EOC [id number] Status is pending

## 2023-02-12 NOTE — Telephone Encounter (Signed)
Pharmacy Patient Advocate Encounter  Received notification from [facility] that Prior Authorization for repatha has been APPROVED from 02/08/23 to 03/11/24   PA #/Case ID/Reference #: [id number]

## 2023-02-16 ENCOUNTER — Other Ambulatory Visit: Payer: Self-pay | Admitting: Family Medicine

## 2023-02-18 ENCOUNTER — Encounter: Payer: Self-pay | Admitting: Family Medicine

## 2023-02-18 ENCOUNTER — Ambulatory Visit: Payer: Medicare Other | Admitting: Family Medicine

## 2023-02-18 VITALS — BP 98/69 | HR 78 | Ht 67.0 in | Wt 323.8 lb

## 2023-02-18 DIAGNOSIS — I2510 Atherosclerotic heart disease of native coronary artery without angina pectoris: Secondary | ICD-10-CM

## 2023-02-18 DIAGNOSIS — E785 Hyperlipidemia, unspecified: Secondary | ICD-10-CM

## 2023-02-18 NOTE — Progress Notes (Signed)
    SUBJECTIVE:   CHIEF COMPLAINT / HPI:   EM is a 67yo M w/ hx of HLD, diastolic CHF, CAD, T2DM that p/f cholesterol check. - Continues to take Repatha injections once every 2 weeks - Does situps for exercise. Does this almost daily now.  - Previously unable to tolerate statin  OBJECTIVE:   BP 98/69   Pulse 78   Ht 5\' 7"  (1.702 m)   Wt (!) 323 lb 12.8 oz (146.9 kg)   SpO2 98%   BMI 50.71 kg/m   General: Alert, pleasant man. NAD. HEENT: NCAT. MMM. CV: RRR, no murmurs.  Resp: CTAB, no wheezing or crackles. Normal WOB on RA.  Abm: Soft, nontender, nondistended. BS present. Ext: Moves all ext spontaneously Skin: Warm, well perfused   ASSESSMENT/PLAN:   Assessment & Plan Dyslipidemia Follows with [facility] clinic, is on Repatha for this.  Will check an updated lipid panel today.  Counseled on weight loss and lifestyle changes, congratulated patient on his daily set up exercises.  Offered other interventions such as starting a GLP-1 or referral to dietitian, he is currently not interested in those options. -Follow-up lipid panel -Advised to follow-up with lipid clinic.  Provided their contact information -Encouraged lifestyle changes.    - f/u in 3 months for A1c  Lincoln Brigham, MD [facility]

## 2023-02-18 NOTE — Patient Instructions (Addendum)
Good to see you today - Thank you for coming in  Things we discussed today:  1) I will recheck your cholesterol level today.   2) Please schedule an appointment with your cardiology clinic to discuss your cholesterol medications.  Laural Golden, PharmD, BCACP, CDCES, CPP [address] [place], [place], [address] Phone: [phone], Fax: [phone]   Please always bring your medication bottles  Come back to see me in 3 months

## 2023-02-19 LAB — LIPID PANEL
Chol/HDL Ratio: 3.8 {ratio} (ref 0.0–5.0)
Cholesterol, Total: 115 mg/dL (ref 100–199)
HDL: 30 mg/dL — ABNORMAL LOW (ref >39)
LDL Chol Calc (NIH): 50 mg/dL (ref 0–99)
Triglycerides: 213 mg/dL — ABNORMAL HIGH (ref 0–149)
VLDL Cholesterol Cal: 35 mg/dL (ref 5–40)

## 2023-03-11 ENCOUNTER — Other Ambulatory Visit: Payer: Self-pay

## 2023-03-11 DIAGNOSIS — M10029 Idiopathic gout, unspecified elbow: Secondary | ICD-10-CM

## 2023-03-14 ENCOUNTER — Telehealth: Payer: Self-pay

## 2023-03-14 DIAGNOSIS — G4733 Obstructive sleep apnea (adult) (pediatric): Secondary | ICD-10-CM

## 2023-03-14 NOTE — Telephone Encounter (Signed)
 Patient calls nurse line requesting DME for his CPAP machine.   He is requesting a hose and "full size" face mask.  Will forward to PCP to place DME order.

## 2023-04-07 ENCOUNTER — Encounter: Payer: Self-pay | Admitting: Internal Medicine

## 2023-04-09 NOTE — Telephone Encounter (Signed)
Receipt confirmed by [employer].   Veronda Prude, RN

## 2023-04-09 NOTE — Telephone Encounter (Signed)
Community message sent to [employer]. Will await response.   Veronda Prude, RN

## 2023-05-06 ENCOUNTER — Telehealth: Payer: Self-pay | Admitting: Pharmacy Technician

## 2023-05-06 ENCOUNTER — Other Ambulatory Visit (HOSPITAL_COMMUNITY): Payer: Self-pay

## 2023-05-06 NOTE — Telephone Encounter (Signed)
 Pharmacy Patient Advocate Encounter   Received notification from CoverMyMeds that prior authorization for Repatha is required/requested.   Insurance verification completed.   The patient is insured through [employer] .   Per test claim: PA required; PA submitted to above mentioned insurance via CoverMyMeds Key/confirmation #/EOC [id number] Status is pending

## 2023-05-07 ENCOUNTER — Other Ambulatory Visit (HOSPITAL_COMMUNITY): Payer: Self-pay

## 2023-05-07 NOTE — Telephone Encounter (Signed)
 Pharmacy Patient Advocate Encounter  Received notification from [employer] that Prior Authorization for Repatha has been APPROVED from 05/06/23 to 05/04/24. Ran test claim, Copay is $47.00- one month. This test claim was processed through [facility]- copay amounts may vary at other pharmacies due to pharmacy/plan contracts, or as the patient moves through the different stages of their insurance plan.   PA #/Case ID/Reference #: [id number]

## 2023-07-04 ENCOUNTER — Emergency Department (HOSPITAL_COMMUNITY)
Admission: EM | Admit: 2023-07-04 | Discharge: 2023-07-04 | Discharge status: Against Medical Advice | Payer: Medicare (Managed Care) | Attending: Emergency Medicine | Admitting: Emergency Medicine

## 2023-07-04 ENCOUNTER — Encounter (HOSPITAL_COMMUNITY): Payer: Self-pay

## 2023-07-04 ENCOUNTER — Other Ambulatory Visit: Payer: Self-pay

## 2023-07-04 ENCOUNTER — Telehealth: Payer: Self-pay

## 2023-07-04 ENCOUNTER — Emergency Department (HOSPITAL_COMMUNITY): Payer: Medicare (Managed Care)

## 2023-07-04 DIAGNOSIS — Z5321 Procedure and treatment not carried out due to patient leaving prior to being seen by health care provider: Secondary | ICD-10-CM | POA: Diagnosis not present

## 2023-07-04 DIAGNOSIS — R531 Weakness: Secondary | ICD-10-CM | POA: Diagnosis not present

## 2023-07-04 DIAGNOSIS — R0602 Shortness of breath: Secondary | ICD-10-CM | POA: Diagnosis not present

## 2023-07-04 DIAGNOSIS — R519 Headache, unspecified: Secondary | ICD-10-CM | POA: Diagnosis not present

## 2023-07-04 DIAGNOSIS — R5383 Other fatigue: Secondary | ICD-10-CM | POA: Insufficient documentation

## 2023-07-04 DIAGNOSIS — R42 Dizziness and giddiness: Secondary | ICD-10-CM | POA: Diagnosis not present

## 2023-07-04 DIAGNOSIS — R0789 Other chest pain: Secondary | ICD-10-CM | POA: Insufficient documentation

## 2023-07-04 LAB — TROPONIN I (HIGH SENSITIVITY)
Troponin I (High Sensitivity): 9 ng/L (ref <18)
Troponin I (High Sensitivity): 9 ng/L (ref <18)

## 2023-07-04 LAB — CBC
HCT: 41.1 % (ref 39.0–52.0)
Hemoglobin: 12.8 g/dL — ABNORMAL LOW (ref 13.0–17.0)
MCH: 25.6 pg — ABNORMAL LOW (ref 26.0–34.0)
MCHC: 31.1 g/dL (ref 30.0–36.0)
MCV: 82.2 fL (ref 80.0–100.0)
Platelets: 311 10*3/uL (ref 150–400)
RBC: 5 MIL/uL (ref 4.22–5.81)
RDW: 15.8 % — ABNORMAL HIGH (ref 11.5–15.5)
WBC: 8.5 10*3/uL (ref 4.0–10.5)
nRBC: 0 % (ref 0.0–0.2)

## 2023-07-04 LAB — URINALYSIS, ROUTINE W REFLEX MICROSCOPIC
Bilirubin Urine: NEGATIVE
Glucose, UA: NEGATIVE mg/dL
Ketones, ur: NEGATIVE mg/dL
Leukocytes,Ua: NEGATIVE
Nitrite: NEGATIVE
Protein, ur: NEGATIVE mg/dL
Specific Gravity, Urine: 1.003 — ABNORMAL LOW (ref 1.005–1.030)
pH: 6 (ref 5.0–8.0)

## 2023-07-04 LAB — BASIC METABOLIC PANEL WITH GFR
Anion gap: 12 (ref 5–15)
BUN: 20 mg/dL (ref 8–23)
CO2: 18 mmol/L — ABNORMAL LOW (ref 22–32)
Calcium: 9.3 mg/dL (ref 8.9–10.3)
Chloride: 108 mmol/L (ref 98–111)
Creatinine, Ser: 1.07 mg/dL (ref 0.61–1.24)
GFR, Estimated: >60 mL/min (ref >60)
Glucose, Bld: 103 mg/dL — ABNORMAL HIGH (ref 70–99)
Potassium: 4.2 mmol/L (ref 3.5–5.1)
Sodium: 138 mmol/L (ref 135–145)

## 2023-07-04 NOTE — Telephone Encounter (Signed)
 Received notification by front office that patient walked in asking if he could be seen for a same day appointment.   Patient voiced concerns for possible "mild heart attack."   Patient reports that he has been having intermittent episodes of chest discomfort. Denies radiation of pain. He does report that pain is similar to how he felt with last heart attack.   Advised patient to proceed to the Emergency Department as soon as possible for further evaluation.   Patient voices understanding.   Elsie Halo, RN

## 2023-07-04 NOTE — ED Triage Notes (Signed)
 Reports Tuesday has some chest pain and thought it would be better but has continued to get weak and have sob with any exertion. Denies chest pain at this time.

## 2023-07-04 NOTE — ED Notes (Signed)
Pt called multiple times for vitals with no answer

## 2023-07-04 NOTE — ED Provider Triage Note (Signed)
 Emergency Medicine Provider Triage Evaluation Note  Alex [name] , a 68 y.o. male  was evaluated in triage.  Pt complains of fatigue. For the past 3 days pt has noticed chest pressure, fatigue, headache, lightheadedness, dizzy, mild sob and weak.  Hx of cardiac disease.  No n/v/d, no dysuria.  No active cp currently  Review of Systems  Positive: As above Negative: As above  Physical Exam  BP (!) 148/102 (BP Location: Right Arm)   Pulse 87   Temp 99.1 F (37.3 C)   Resp 16   Ht 5\' 7"  (1.702 m)   Wt (!) 146.5 kg   SpO2 97%   BMI 50.59 kg/m  Gen:   Awake, no distress   Resp:  Normal effort  MSK:   Moves extremities without difficulty  Other:    Medical Decision Making  Medically screening exam initiated at 3:54 PM.  Appropriate orders placed.  Arman Lamprey was informed that the remainder of the evaluation will be completed by another provider, this initial triage assessment does not replace that evaluation, and the importance of remaining in the ED until their evaluation is complete.     Debbra Fairy, PA-C 07/04/23 1555

## 2023-07-05 ENCOUNTER — Encounter (HOSPITAL_COMMUNITY): Payer: Self-pay | Admitting: Emergency Medicine

## 2023-07-05 ENCOUNTER — Emergency Department (HOSPITAL_COMMUNITY): Payer: Medicare (Managed Care)

## 2023-07-05 ENCOUNTER — Telehealth: Payer: Self-pay | Admitting: Cardiovascular Disease

## 2023-07-05 ENCOUNTER — Emergency Department (HOSPITAL_COMMUNITY)
Admission: EM | Admit: 2023-07-05 | Discharge: 2023-07-06 | Disposition: A | Discharge status: Home / Self Care | Payer: Medicare (Managed Care) | Attending: Emergency Medicine | Admitting: Emergency Medicine

## 2023-07-05 DIAGNOSIS — E119 Type 2 diabetes mellitus without complications: Secondary | ICD-10-CM | POA: Diagnosis not present

## 2023-07-05 DIAGNOSIS — Z7982 Long term (current) use of aspirin: Secondary | ICD-10-CM | POA: Diagnosis not present

## 2023-07-05 DIAGNOSIS — R079 Chest pain, unspecified: Secondary | ICD-10-CM | POA: Diagnosis present

## 2023-07-05 DIAGNOSIS — R002 Palpitations: Secondary | ICD-10-CM | POA: Diagnosis not present

## 2023-07-05 DIAGNOSIS — I2510 Atherosclerotic heart disease of native coronary artery without angina pectoris: Secondary | ICD-10-CM | POA: Diagnosis not present

## 2023-07-05 DIAGNOSIS — I5030 Unspecified diastolic (congestive) heart failure: Secondary | ICD-10-CM | POA: Insufficient documentation

## 2023-07-05 LAB — BASIC METABOLIC PANEL WITH GFR
Anion gap: 11 (ref 5–15)
BUN: 17 mg/dL (ref 8–23)
CO2: 23 mmol/L (ref 22–32)
Calcium: 9.5 mg/dL (ref 8.9–10.3)
Chloride: 105 mmol/L (ref 98–111)
Creatinine, Ser: 1.16 mg/dL (ref 0.61–1.24)
GFR, Estimated: >60 mL/min (ref >60)
Glucose, Bld: 110 mg/dL — ABNORMAL HIGH (ref 70–99)
Potassium: 4 mmol/L (ref 3.5–5.1)
Sodium: 139 mmol/L (ref 135–145)

## 2023-07-05 LAB — TROPONIN I (HIGH SENSITIVITY)
Troponin I (High Sensitivity): 7 ng/L (ref <18)
Troponin I (High Sensitivity): 8 ng/L (ref <18)

## 2023-07-05 LAB — CBC
HCT: 41 % (ref 39.0–52.0)
Hemoglobin: 12.9 g/dL — ABNORMAL LOW (ref 13.0–17.0)
MCH: 25.9 pg — ABNORMAL LOW (ref 26.0–34.0)
MCHC: 31.5 g/dL (ref 30.0–36.0)
MCV: 82.2 fL (ref 80.0–100.0)
Platelets: 298 10*3/uL (ref 150–400)
RBC: 4.99 MIL/uL (ref 4.22–5.81)
RDW: 15.7 % — ABNORMAL HIGH (ref 11.5–15.5)
WBC: 8.8 10*3/uL (ref 4.0–10.5)
nRBC: 0 % (ref 0.0–0.2)

## 2023-07-05 LAB — MAGNESIUM: Magnesium: 1.8 mg/dL (ref 1.7–2.4)

## 2023-07-05 NOTE — ED Provider Notes (Signed)
 [facility] AT [facility] Provider Note   CSN: [id number] Arrival date & time: 07/05/23  1206     History  Chief Complaint  Patient presents with   Chest Pain    Alex [name] is a 68 y.o. male.  With history of diastolic heart failure, type 2 diabetes, hyperlipidemia, TIA, and CAD on cardiac catheter 2002 with history of MI without stent placement.  He presents today with concern for fatigue with mild exertion as well as intermittent palpitations, onset during activity resolved with rest.  He experienced a brief episode of discomfort in his chest when walking yesterday which resolved as soon as he sat down and rested.  No chest pain today, but does not continue to endorse dyspnea on exertion.  Patient seen by Dr. Abel Hoe, heart care.  Does state that he has an appointment scheduled with them for Monday, less than 72 hours from now..  He has not on any anticoagulation but does endorse compliance with his medications daily.  HPI     Home Medications Prior to Admission medications   Medication Sig Start Date End Date Taking? Authorizing Provider  acetaminophen  (TYLENOL ) 500 MG tablet Take 500 mg by mouth every 6 (six) hours as needed for moderate pain.    [provider]  allopurinol  (ZYLOPRIM ) 100 MG tablet Take 1 tablet (100 mg total) by mouth daily. 06/14/22   Albin Huh, MD  amLODipine  (NORVASC ) 10 MG tablet TAKE 1 TABLET BY MOUTH ONCE DAILY WITH SUPPER 01/09/21   Lilland, Alana, DO  aspirin  81 MG chewable tablet Chew 1 tablet (81 mg total) by mouth daily. 06/17/22   Edson Graces, MD  carvedilol  (COREG ) 25 MG tablet TAKE 1 TABLET BY MOUTH ONCE DAILY WITH SUPPER 07/18/20   Jovita Nipper, MD  colchicine  0.6 MG tablet Take 1 tablet (0.6 mg total) by mouth daily. 11/10/20   Lilland, Alana, DO  Evolocumab  (REPATHA  SURECLICK) 140 MG/ML SOAJ INJECT 140 MG INTO THE SKIN EVERY 14 DAYS 01/21/23   Odie Benne, MD  glucose blood test strip 1  each by Other route daily. Use as instructed 01/26/14   Kent Pear, MD  irbesartan  (AVAPRO ) 150 MG tablet Take 1 tablet (150 mg total) by mouth daily. Please make overdue appt with Dr. Abel Hoe before anymore refills  Thank you 3rd and Final attempt 04/19/21   Odie Benne, MD  naproxen  (NAPROSYN ) 500 MG tablet TAKE 1 TABLET BY MOUTH ONCE DAILY AS NEEDED FOR MODERATE PAIN 10/16/22   Albin Huh, MD  omeprazole  (PRILOSEC) 40 MG capsule TAKE 1 CAPSULE BY MOUTH ONCE DAILY WITH SUPPER 02/21/21   Lilland, Alana, DO  triamcinolone  ointment (KENALOG ) 0.5 % APPLY OINTMENT TOPICALLY TO AFFECTED AREA AS NEEDED 02/19/23   Albin Huh, MD      Allergies    Atorvastatin , Codeine, Crestor  [rosuvastatin ], and Shellfish allergy    Review of Systems   Review of Systems  Respiratory:         DOE  Cardiovascular:  Positive for chest pain and palpitations. Negative for leg swelling.    Physical Exam Updated Vital Signs BP (!) 134/98 (BP Location: Left Arm)   Pulse 72   Temp 97.7 F (36.5 C) (Oral)   Resp 20   Wt (!) 146 kg   SpO2 97%   BMI 50.41 kg/m  Physical Exam Vitals and nursing note reviewed.  Constitutional:      Appearance: He is obese. He is not ill-appearing or toxic-appearing.  HENT:     Head: Normocephalic and atraumatic.     Mouth/Throat:     Mouth: Mucous membranes are moist.     Pharynx: No oropharyngeal exudate or posterior oropharyngeal erythema.  Eyes:     General:        Right eye: No discharge.        Left eye: No discharge.     Conjunctiva/sclera: Conjunctivae normal.  Cardiovascular:     Rate and Rhythm: Normal rate and regular rhythm.     Pulses: Normal pulses.     Heart sounds: Normal heart sounds. No murmur heard. Pulmonary:     Effort: Pulmonary effort is normal. No respiratory distress.     Breath sounds: Normal breath sounds. No wheezing or rales.  Chest:     Chest wall: No mass, tenderness or edema.  Abdominal:     General: Bowel sounds  are normal. There is no distension.     Palpations: Abdomen is soft.     Tenderness: There is no abdominal tenderness.  Musculoskeletal:        General: No deformity.     Cervical back: Neck supple.     Right lower leg: No edema.     Left lower leg: No edema.  Skin:    General: Skin is warm and dry.     Capillary Refill: Capillary refill takes less than 2 seconds.  Neurological:     General: No focal deficit present.     Mental Status: He is alert and oriented to person, place, and time. Mental status is at baseline.  Psychiatric:        Mood and Affect: Mood normal.     ED Results / Procedures / Treatments   Labs (all labs ordered are listed, but only abnormal results are displayed) Labs Reviewed  BASIC METABOLIC PANEL WITH GFR - Abnormal; Notable for the following components:      Result Value   Glucose, Bld 110 (*)    All other components within normal limits  CBC - Abnormal; Notable for the following components:   Hemoglobin 12.9 (*)    MCH 25.9 (*)    RDW 15.7 (*)    All other components within normal limits  MAGNESIUM  TROPONIN I (HIGH SENSITIVITY)  TROPONIN I (HIGH SENSITIVITY)    EKG None  Radiology DG Chest 2 View Result Date: 07/05/2023 CLINICAL DATA:  Five day history of left-sided chest pain and shortness of breath EXAM: CHEST - 2 VIEW COMPARISON:  Chest radiograph dated 07/04/2023 FINDINGS: Normal lung volumes. No focal consolidations. No pleural effusion or pneumothorax. Similar mildly enlarged cardiomediastinal silhouette. No acute osseous abnormality. IMPRESSION: 1. No active cardiopulmonary disease. 2. Similar mild cardiomegaly Electronically Signed   By: Limin  Xu M.D.   On: 07/05/2023 14:27   DG Chest 2 View Result Date: 07/04/2023 CLINICAL DATA:  Chest pain EXAM: CHEST - 2 VIEW COMPARISON:  September 05, 2017 FINDINGS: No focal airspace consolidation, pleural effusion, or pneumothorax. No cardiomegaly. Tortuous aorta with aortic atherosclerosis. No acute  fracture or destructive lesions. Multilevel thoracic osteophytosis. IMPRESSION: No acute cardiopulmonary abnormality. Electronically Signed   By: Rance Burrows M.D.   On: 07/04/2023 16:55    Procedures Procedures    Medications Ordered in ED Medications - No data to display  ED Course/ Medical Decision Making/ A&P  Medical Decision Making 68 year old male with history of CAD who presents with concern for dyspnea on exertion.  Hypertensive on intake, vital signs otherwise normal.  Cardiopulmonary abdominal was in the room.  No lower extremity edema.  Patient without difficulty speaking, no shortness of breath during conversation.  Ambulatory to and from the restroom without difficulty.  Differential diagnosis includes limited to ACS, PE, pleural effusion, pneumonia, pneumothorax, stable angina, viral illness.    Amount and/or Complexity of Data Reviewed Labs: ordered.    Details: CBC with mild anemia with hemoglobin of 12.9 at patient's baseline.  BMP unremarkable, troponin negative x 2, 7 and 8.  Magnesium is normal.   Radiology: ordered.    Details: X-ray negative for acute cardiopulmonary disease, mild cardiomegaly at patient's baseline.    ECG/medicine tests:     Details:  EKGs with PACs and new first-degree AV block.   Clinical concern for emergent underlying condition that would warrant further ED workup or inpatient management this evening is exceedingly low.  I do feel it is appropriate that patient follow-up closely in the outpatient setting with his cardiologist with whom he has an appointment on Monday, less than 72 hours from now.  Alex [name] voiced understanding of his medical evaluation and treatment plan. Each of their questions answered to their expressed satisfaction.  Return precautions were given.  Patient is well-appearing, stable, and was discharged in good condition.  This chart was dictated using voice recognition software,  Dragon. Despite the best efforts of this provider to proofread and correct errors, errors may still occur which can change documentation meaning.          Final Clinical Impression(s) / ED Diagnoses Final diagnoses:  Palpitations    Rx / DC Orders ED Discharge Orders          Ordered    Ambulatory referral to Cardiology       Comments: If you have not heard from the Cardiology office within the next 72 hours please call [phone].   07/06/23 0113              Elaya Droege, Adelle Agent, PA-C 07/06/23 Marzetta Solar, MD 07/08/23 [phone]

## 2023-07-05 NOTE — Telephone Encounter (Signed)
 Patient identification verified by 2 forms. Alex Lucky, RN    Called and spoke to patient  Patient states:   -presented to ED yesterday with c/o chest pain   -he had a mild heart attack   -did not finish waiting to be seen in ED yesterday   -continues to have chest pain today, fluttering and over all weakness  Advised patient to present to ED today for persistent symptoms and to wait to be seen  Patient agrees, no further questions at this time

## 2023-07-05 NOTE — Telephone Encounter (Signed)
 Pt was seen in the ED yesterday and states he had a mild heart attack. He would like a c/b in regards to what he should do next. Please advise

## 2023-07-05 NOTE — ED Provider Triage Note (Signed)
 Emergency Medicine Provider Triage Evaluation Note  Alex [name] , a 68 y.o. male  was evaluated in triage.  Pt complains of chest fluttering for the past week.  Patient says that the chest pain is in the left side and comes and goes without notice.  Patient does have history of cardiac disease including CAD.  Patient does not have history of blood clots and is not on any blood thinners.  Patient denies any recent hospitalizations, travel, surgeries or any leg swelling.  Patient denies any shortness of breath.  Patient was seen yesterday in triage but ultimately left due to the wait and states that the symptoms are still the same without any changes.  Review of Systems  Positive:  Negative:   Physical Exam  BP (!) 144/98 (BP Location: Right Arm)   Pulse 60   Temp 98.7 F (37.1 C)   Resp 16   Wt (!) 146 kg   SpO2 98%   BMI 50.41 kg/m  Gen:   Awake, no distress   Resp:  Normal effort  MSK:   Moves extremities without difficulty  Other:    Medical Decision Making  Medically screening exam initiated at 12:51 PM.  Appropriate orders placed.  Arman Lamprey was informed that the remainder of the evaluation will be completed by another provider, this initial triage assessment does not replace that evaluation, and the importance of remaining in the ED until their evaluation is complete.  Workup initiated, patient is EKG does show a sinus arrhythmia with first-degree heart block which could explain patient's fluttering in his chest and symptoms, patient stable at this time.   Denese Finn, PA-C 07/05/23 1253

## 2023-07-05 NOTE — ED Triage Notes (Signed)
 Chest pain, weakness, headache and SOB, x 5 days. PT states came yesterday with same complaints but didn't stay.

## 2023-07-06 NOTE — Discharge Instructions (Addendum)
 You were seen in the ER today for your palpitations and fatigue.  Please follow-up with a cardiologist as previously scheduled on Monday.  There is no evidence of heart attack in the ER tonight.  Return to the ER with any severe symptoms.

## 2023-07-08 ENCOUNTER — Encounter: Payer: Self-pay | Admitting: Cardiovascular Disease

## 2023-07-08 ENCOUNTER — Ambulatory Visit: Payer: Medicare (Managed Care) | Attending: Cardiovascular Disease | Admitting: Cardiovascular Disease

## 2023-07-08 VITALS — BP 135/85 | HR 77 | Ht 67.0 in | Wt 322.6 lb

## 2023-07-08 DIAGNOSIS — E7800 Pure hypercholesterolemia, unspecified: Secondary | ICD-10-CM

## 2023-07-08 DIAGNOSIS — I2510 Atherosclerotic heart disease of native coronary artery without angina pectoris: Secondary | ICD-10-CM

## 2023-07-08 DIAGNOSIS — I10 Essential (primary) hypertension: Secondary | ICD-10-CM

## 2023-07-08 DIAGNOSIS — I7121 Aneurysm of the ascending aorta, without rupture: Secondary | ICD-10-CM

## 2023-07-08 DIAGNOSIS — R072 Precordial pain: Secondary | ICD-10-CM

## 2023-07-08 NOTE — Patient Instructions (Addendum)
 Medication Instructions:  No changes *If you need a refill on your cardiac medications before your next appointment, please call your pharmacy*  Lab Work: Today: go to the first floor [employer] for blood work Designer, jewellery)  If you have labs (blood work) drawn today and your tests are completely normal, you will receive your results only by: [employer] (if you have MyChart) OR A paper copy in the mail If you have any lab test that is abnormal or we need to change your treatment, we will call you to review the results.  Testing/Procedures: Coronary CT Angiogram  Follow-Up: At [facility], you and your health needs are our priority.  As part of our continuing mission to provide you with exceptional heart care, our providers are all part of one team.  This team includes your primary Cardiologist (physician) and Advanced Practice Providers or APPs (Physician Assistants and Nurse Practitioners) who all work together to provide you with the care you need, when you need it.  Your next appointment:   12 month(s)  Provider:   Antoinette Batman, MD     Other Instructions   Your cardiac CT will be scheduled at one of the below locations:   [facility] [address] [place], [place] [address] [phone]  OR  [facility] [address] [place], [place] [address] [phone]  OR   [facility] [address] [place], [place] [address] [phone]  OR   [facility] [address] [place], [place] [address] [phone]  OR   [facility] and V[facility] Tower [address]  [place], [place] [address] Opening July 08, 2023  If scheduled at [facility], please arrive at the [facility] and Children's Entrance (Entrance C2) of [facility] 30 minutes prior to test start time. You can use the FREE valet parking offered at entrance C (encouraged  to control the heart rate for the test)  Proceed to the [facility] Radiology Department (first floor) to check-in and test prep.   All radiology patients and guests should use entrance C2 at [facility], accessed from [facility], even though the hospital's physical address listed is [address]    If scheduled at the Heart and Vascular Tower at [address], please enter the parking lot using the [address] entrance and use the FREE valet service at the patient drop-off area. Enter the buidling and check-in with registration on the main floor.  If scheduled at [facility] or [facility], please arrive 15 mins early for check-in and test prep.  There is spacious parking and easy access to the radiology department from the [facility] Heart and Vascular entrance. Please enter here and check-in with the desk attendant.   If scheduled at [facility], please arrive 30 minutes early for check-in and test prep.  Please follow these instructions carefully (unless otherwise directed):  An IV will be required for this test and Nitroglycerin will be given.  Hold all erectile dysfunction medications at least 3 days (72 hrs) prior to test. (Ie viagra, cialis, sildenafil, tadalafil, etc)   On the Night Before the Test: Be sure to Drink plenty of water. Do not consume any caffeinated/decaffeinated beverages or chocolate 12 hours prior to your test. Do not take any antihistamines 12 hours prior to your test.  On the Day of the Test: Drink plenty of  water until 1 hour prior to the test. Do not eat any food 1 hour prior to test. You may take your regular medications prior to the test.  Take your usual Carvedilol  tablet two hours prior to test. Patients who wear a continuous glucose monitor MUST remove the device prior to scanning.      After the Test: Drink plenty of water. After receiving IV contrast, you may  experience a mild flushed feeling. This is normal. On occasion, you may experience a mild rash up to 24 hours after the test. This is not dangerous. If this occurs, you can take Benadryl 25 mg, Zyrtec , Claritin , or Allegra and increase your fluid intake. (Patients taking Tikosyn should avoid Benadryl, and may take Zyrtec , Claritin , or Allegra) If you experience trouble breathing, this can be serious. If it is severe call 911 IMMEDIATELY. If it is mild, please call our office.  We will call to schedule your test 2-4 weeks out understanding that some insurance companies will need an authorization prior to the service being performed.   For more information and frequently asked questions, please visit our website : [email or web address]  For non-scheduling related questions, please contact the cardiac imaging nurse navigator should you have any questions/concerns: Cardiac Imaging Nurse Navigators Direct Office Dial: [phone]   For scheduling needs, including cancellations and rescheduling, please call [place], [phone].

## 2023-07-08 NOTE — Progress Notes (Signed)
 Chief Complaint  Patient presents with   Follow-up    [name]   History of Present Illness: 68 yo male with history of mild [name], Alex [name], Alex [name], Alex [name], Alex [name], Alex [name], Alex [name], Alex [name], Alex [name], Alex TIA who is here today for follow up. Cardiac cath in 2002 showed 25% stenosis in the distal RCA. Echo in 2013 showed normal LV size and function with LVEF of 55-65% and moderate LVH. There were no significant valvular issues. Stress myoview  in 2013 with no ischemia. He has not tolerated statins due to muscle weakness but is now on Repatha . Echo April 2023 with normal LV size and function with LVEF=50-55% with inferior wall hypokinesis, moderate LVH, no valve issues. Dilated ascending aorta. Chest CTA August 2023 with 4.1 cm ascending aortic aneurysm.   He is here today for follow up. He is now having central chest pain at rest and with exertion. He was seen in the ED on 07/05/23 and had negative troponin. He has severe fatigue. No dyspnea, palpitations, lower extremity edema, orthopnea, PND, dizziness, near syncope or syncope.    Primary Care Physician: Alex Huh, MD  Past Medical History:  Diagnosis Date   Asthma    [name] (coronary artery disease)    Cardiac cath 2002 with 25% distal RCA stenosis.    DDD (degenerative disc disease), lumbar    Diabetes mellitus (HCC)    Diverticulitis    Esophageal stricture    Gastritis 2010   Alex [name] (gastroesophageal reflux disease)    GI bleed    Alex [name]    Alex [name] (hypertension)    Alex [name]    Insomnia    MI (myocardial infarction) (HCC) 2009   no stent placement   OA (osteoarthritis)    Alex [name]    OSA on CPAP    Seasonal allergies    TIA (transient ischemic attack) 2013    Past Surgical History:  Procedure Laterality Date   COLONOSCOPY WITH PROPOFOL  N/A 11/11/2018   Procedure: COLONOSCOPY WITH PROPOFOL ;  Surgeon: Lindle Rhea, MD;  Location: [facility] ENDOSCOPY;  Service: Gastroenterology;  Laterality: N/A;    ESOPHAGOGASTRODUODENOSCOPY     multiple   NASAL SINUS SURGERY     POLYPECTOMY  11/11/2018   Procedure: POLYPECTOMY;  Surgeon: Lindle Rhea, MD;  Location: [facility] ENDOSCOPY;  Service: Gastroenterology;;   VASECTOMY      Current Outpatient Medications  Medication Sig Dispense Refill   acetaminophen  (TYLENOL ) 500 MG tablet Take 500 mg by mouth every 6 (six) hours as needed for moderate pain.     allopurinol  (ZYLOPRIM ) 100 MG tablet Take 1 tablet (100 mg total) by mouth daily. 60 tablet 3   amLODipine  (NORVASC ) 10 MG tablet TAKE 1 TABLET BY MOUTH ONCE DAILY WITH SUPPER 90 tablet 0   aspirin  81 MG chewable tablet Chew 1 tablet (81 mg total) by mouth daily. 30 tablet 2   carvedilol  (COREG ) 25 MG tablet TAKE 1 TABLET BY MOUTH ONCE DAILY WITH SUPPER 90 tablet 3   colchicine  0.6 MG tablet Take 1 tablet (0.6 mg total) by mouth daily. 6 tablet 0   Evolocumab  (REPATHA  SURECLICK) 140 MG/ML SOAJ INJECT 140 MG INTO THE SKIN EVERY 14 DAYS 2 mL 5   irbesartan  (AVAPRO ) 150 MG tablet Take 1 tablet (150 mg total) by mouth daily. Please make overdue appt with Dr. Abel Hoe before anymore refills  Thank you 3rd and Final attempt 15 tablet 0   naproxen  (NAPROSYN ) 500 MG tablet TAKE 1 TABLET BY MOUTH ONCE DAILY AS NEEDED FOR MODERATE  PAIN 30 tablet 2   omeprazole  (PRILOSEC) 40 MG capsule TAKE 1 CAPSULE BY MOUTH ONCE DAILY WITH SUPPER 90 capsule 0   triamcinolone  ointment (KENALOG ) 0.5 % APPLY OINTMENT TOPICALLY TO AFFECTED AREA AS NEEDED 15 g 2   glucose blood test strip 1 each by Other route daily. Use as instructed 100 each 12   No current facility-administered medications for this visit.    Allergies  Allergen Reactions   Atorvastatin  Other (See Comments)    Subjective myalgias. Gave 2 trials, developed myalgias which resolved after cessation of medication.  Muscle cramps were 9/10 discomfort   Codeine Nausea Only   Crestor  [Rosuvastatin ]     myalgias   Shellfish Allergy Other (See Comments)    Activates  asthma    Social History   Socioeconomic History   Marital status: Widowed    Spouse name: Not on file   Number of children: 4   Years of education: 35   Highest education level: Not on file  Occupational History   Occupation: Disability-truck driver  Tobacco Use   Smoking status: Former    Current packs/day: 0.00    Average packs/day: 1.5 packs/day for 10.0 years (15.0 ttl pk-yrs)    Types: Cigarettes    Start date: 11/05/1981    Quit date: 11/06/1991    Years since quitting: 31.6   Smokeless tobacco: Never   Tobacco comments:    quit 1993  Vaping Use   Vaping status: Never Used  Substance and Sexual Activity   Alcohol use: No   Drug use: No   Sexual activity: Not on file  Other Topics Concern   Not on file  Social History Narrative   On disability   [employer]   Quit smoking 20 years ago (as of 01/2012)      Health Care POA:    Emergency Contact: brother, Debria Fang [phone]   End of Life Plan: gave Ad pamphlet 5/15   Who lives with you: self   Any pets: [name]   Diet: pt has a varied diet of protein, starch and vegetables.   Exercise: pt does not have regular exercise routine.   Seatbelts: Pt reports wearing seatbelt when in vehicles.    Hobbies: plays bass and keyboard in church music group         Social Drivers of Health   Financial Resource Strain: Low Risk  (07/10/2022)   Overall Financial Resource Strain (CARDIA)    Difficulty of Paying Living Expenses: Not hard at all  Food Insecurity: No Food Insecurity (07/10/2022)   Hunger Vital Sign    Worried About Running Out of Food in the Last Year: Never true    Ran Out of Food in the Last Year: Never true  Transportation Needs: No Transportation Needs (07/10/2022)   PRAPARE - Administrator, Civil Service (Medical): No    Lack of Transportation (Non-Medical): No  Physical Activity: Inactive (07/10/2022)   Exercise Vital Sign    Days of Exercise per Week: 0 days    Minutes of Exercise per Session: 0 min   Stress: No Stress Concern Present (07/10/2022)   [employer] of Occupational Health - Occupational Stress Questionnaire    Feeling of Stress : Not at all  Social Connections: Moderately Integrated (07/10/2022)   Social Connection and Isolation Panel [NHANES]    Frequency of Communication with Friends and Family: More than three times a week    Frequency of Social Gatherings with Friends and Family: More than three  times a week    Attends Religious Services: More than 4 times per year    Active Member of Clubs or Organizations: Yes    Attends Banker Meetings: More than 4 times per year    Marital Status: Widowed  Intimate Partner Violence: Not At Risk (07/10/2022)   Humiliation, Afraid, Rape, and Kick questionnaire    Fear of Current or Ex-Partner: No    Emotionally Abused: No    Physically Abused: No    Sexually Abused: No    Family History  Problem Relation Age of Onset   Heart attack Mother    Heart failure Mother    Colon cancer Neg Hx    Esophageal cancer Neg Hx    Rectal cancer Neg Hx    Stomach cancer Neg Hx     Review of Systems:  As stated in the HPI and otherwise negative.   BP 135/85   Pulse 77   Ht 5\' 7"  (1.702 m)   Wt (!) 146.3 kg   SpO2 97%   BMI 50.53 kg/m   Physical Examination: General: Well developed, well nourished, NAD  HEENT: OP clear, mucus membranes moist  SKIN: warm, dry. No rashes. Neuro: No focal deficits  Musculoskeletal: Muscle strength 5/5 all ext  Psychiatric: Mood and affect normal  Neck: No JVD, no carotid bruits, no thyromegaly, no lymphadenopathy.  Lungs:Clear bilaterally, no wheezes, rhonci, crackles Cardiovascular: Regular rate and rhythm. No murmurs, gallops or rubs. Abdomen:Soft. Bowel sounds present. Non-tender.  Extremities: No lower extremity edema. Pulses are 2 + in the bilateral DP/PT.  EKG:  EKG is not ordered today. The ekg ordered today demonstrates    Recent Labs: 07/05/2023: BUN 17; Creatinine,  Ser 1.16; Hemoglobin 12.9; Magnesium 1.8; Platelets 298; Potassium 4.0; Sodium 139   Lipid Panel    Component Value Date/Time   CHOL 115 02/18/2023 1436   TRIG 213 (H) 02/18/2023 1436   HDL 30 (L) 02/18/2023 1436   CHOLHDL 3.8 02/18/2023 1436   CHOLHDL 8.4 09/06/2017 0512   VLDL 39 09/06/2017 0512   LDLCALC 50 02/18/2023 1436   LDLDIRECT 130 (H) 02/22/2012 0910     Wt Readings from Last 3 Encounters:  07/08/23 (!) 146.3 kg  07/05/23 (!) 146 kg  07/04/23 (!) 146.5 kg    Assessment and Plan:   1.[name] without angina: He had mild disease by cath 2002. Echo in April 2023 with inferior wall hypokinesis. He is having chest pain, Cannot exclude angina. Will arrange a coronary CTA to exclude obstructive [name]. He does not tolerate statins. His ASA was stopped when he had diverticulitis.    2.  Alex [name]: He is statin intolerant. He is tolerating Repatha . LDL 50 in December 204.    3. Alex [name]: BP is well controlled. No changes today   4. Alex [name]: He wears CPAP   5. Thoracic aortic aneurysm:  4.1 cm ascending aorta by chest CTA August 2023. Repeat chest CTA now.   Labs/ tests ordered today include:   Orders Placed This Encounter  Procedures   CT CORONARY MORPH W/CTA COR W/SCORE W/CA W/CM &/OR WO/CM   Basic Metabolic Panel (BMET)     Disposition:   F/U with me in one year.    Signed, Antoinette Batman, MD 07/08/2023 11:28 AM    [facility] [address] [place], [place], [place]  [address] Phone: [phone]; Fax: [phone]

## 2023-07-09 LAB — BASIC METABOLIC PANEL WITH GFR
BUN/Creatinine Ratio: 18 (ref 10–24)
BUN: 21 mg/dL (ref 8–27)
CO2: 21 mmol/L (ref 20–29)
Calcium: 9.9 mg/dL (ref 8.6–10.2)
Chloride: 105 mmol/L (ref 96–106)
Creatinine, Ser: 1.15 mg/dL (ref 0.76–1.27)
Glucose: 103 mg/dL — ABNORMAL HIGH (ref 70–99)
Potassium: 4.5 mmol/L (ref 3.5–5.2)
Sodium: 142 mmol/L (ref 134–144)
eGFR: 69 mL/min/{1.73_m2} (ref >59)

## 2023-07-18 ENCOUNTER — Other Ambulatory Visit: Payer: Self-pay

## 2023-07-18 MED ORDER — COLCHICINE 0.6 MG PO TABS
0.6000 mg | ORAL_TABLET | Freq: Every day | ORAL | 1 refills | Status: DC
Start: 2023-07-18 — End: 2023-08-15

## 2023-07-18 NOTE — Telephone Encounter (Signed)
 Patient calls nurse line requesting a refill on Colchicine .   He reports a gout flare to his foot that started yesterday. He reports he can barely put his shoe on.   Will forward to PCP for refill.

## 2023-07-19 ENCOUNTER — Encounter (HOSPITAL_COMMUNITY): Payer: Self-pay

## 2023-07-22 ENCOUNTER — Telehealth (HOSPITAL_COMMUNITY): Payer: Self-pay | Admitting: *Deleted

## 2023-07-22 NOTE — Telephone Encounter (Signed)
 Reaching out to patient to offer assistance regarding upcoming cardiac imaging study; pt verbalizes understanding of appt date/time, parking situation and where to check in, pre-test NPO status and medications ordered, and verified current allergies; name and call back number provided for further questions should they arise Johney Frame RN Navigator Cardiac Imaging [name] [phone] office [phone] cell

## 2023-07-23 ENCOUNTER — Ambulatory Visit (HOSPITAL_COMMUNITY)
Admission: RE | Admit: 2023-07-23 | Discharge: 2023-07-23 | Disposition: A | Discharge status: Home / Self Care | Payer: Medicare (Managed Care) | Source: Ambulatory Visit | Attending: Cardiovascular Disease | Admitting: Cardiovascular Disease

## 2023-07-23 DIAGNOSIS — R931 Abnormal findings on diagnostic imaging of heart and coronary circulation: Secondary | ICD-10-CM | POA: Diagnosis present

## 2023-07-23 DIAGNOSIS — I2510 Atherosclerotic heart disease of native coronary artery without angina pectoris: Secondary | ICD-10-CM | POA: Diagnosis not present

## 2023-07-23 DIAGNOSIS — R072 Precordial pain: Secondary | ICD-10-CM | POA: Insufficient documentation

## 2023-07-23 MED ORDER — NITROGLYCERIN 0.4 MG SL SUBL
0.8000 mg | SUBLINGUAL_TABLET | Freq: Once | SUBLINGUAL | Status: AC
  Administered 2023-07-23: 0.8 mg via SUBLINGUAL

## 2023-07-23 MED ORDER — IOHEXOL 350 MG/ML SOLN
100.0000 mL | Freq: Once | INTRAVENOUS | Status: AC | PRN
  Administered 2023-07-23: 100 mL via INTRAVENOUS

## 2023-07-24 ENCOUNTER — Ambulatory Visit (HOSPITAL_COMMUNITY)
Admission: RE | Admit: 2023-07-24 | Discharge: 2023-07-24 | Disposition: A | Discharge status: Home / Self Care | Payer: Medicare (Managed Care) | Source: Ambulatory Visit | Attending: Cardiology | Admitting: Cardiology

## 2023-07-24 ENCOUNTER — Other Ambulatory Visit: Payer: Self-pay | Admitting: Cardiology

## 2023-07-24 DIAGNOSIS — I2510 Atherosclerotic heart disease of native coronary artery without angina pectoris: Secondary | ICD-10-CM

## 2023-07-24 DIAGNOSIS — R931 Abnormal findings on diagnostic imaging of heart and coronary circulation: Secondary | ICD-10-CM

## 2023-07-25 ENCOUNTER — Other Ambulatory Visit (HOSPITAL_COMMUNITY): Payer: Medicare (Managed Care)

## 2023-07-25 ENCOUNTER — Telehealth: Payer: Self-pay | Admitting: *Deleted

## 2023-07-25 ENCOUNTER — Ambulatory Visit: Payer: Self-pay | Admitting: Cardiovascular Disease

## 2023-07-25 MED ORDER — ISOSORBIDE MONONITRATE ER 30 MG PO TB24: 30.0000 mg | ORAL_TABLET | Freq: Every day | ORAL | 3 refills | Status: DC

## 2023-07-25 MED ORDER — NITROGLYCERIN 0.4 MG SL SUBL
0.4000 mg | SUBLINGUAL_TABLET | SUBLINGUAL | 3 refills | Status: AC | PRN
Start: 2023-07-25 — End: ?

## 2023-07-25 NOTE — Telephone Encounter (Signed)
 Message from Dr. Abel Hoe re: cardiac CTA findings - shows high grade lesions in prox LAD and mid RCA positive by FFR. Pt needs follow up appointment to discuss results and cardiac catheterization, and prescriptions for isosorbide 30 mg and ntg prn sent in.  Called patient and reviewed information above.  He voices understanding. Will start IMDUR today.   Appointment scheduled 07/29/23 with Dr. Abel Hoe.  Pt appreciative for call today.

## 2023-07-29 ENCOUNTER — Encounter: Payer: Self-pay | Admitting: Cardiovascular Disease

## 2023-07-29 ENCOUNTER — Ambulatory Visit: Payer: Medicare (Managed Care) | Attending: Cardiovascular Disease | Admitting: Cardiovascular Disease

## 2023-07-29 VITALS — BP 118/86 | HR 82 | Ht 67.0 in | Wt 325.0 lb

## 2023-07-29 DIAGNOSIS — I10 Essential (primary) hypertension: Secondary | ICD-10-CM

## 2023-07-29 DIAGNOSIS — E7800 Pure hypercholesterolemia, unspecified: Secondary | ICD-10-CM | POA: Diagnosis not present

## 2023-07-29 DIAGNOSIS — I25110 Atherosclerotic heart disease of native coronary artery with unstable angina pectoris: Secondary | ICD-10-CM | POA: Diagnosis not present

## 2023-07-29 DIAGNOSIS — I7121 Aneurysm of the ascending aorta, without rupture: Secondary | ICD-10-CM

## 2023-07-29 NOTE — Progress Notes (Signed)
 Chief Complaint  Patient presents with   Follow-up    CAD   History of Present Illness: 68 yo male with history of mild CAD, chronic diastolic CHF, DM, GERD, gout, HTN, Hyperlipidemia, obesity, sleep apnea, prior TIA who is here today for follow up. Cardiac cath in 2002 showed 25% stenosis in the distal RCA. Echo in 2013 showed normal LV size and function with LVEF of 55-65% and moderate LVH. There were no significant valvular issues. Stress myoview  in 2013 with no ischemia. He has not tolerated statins due to muscle weakness but is now on Repatha . Echo April 2023 with normal LV size and function with LVEF=50-55% with inferior wall hypokinesis, moderate LVH, no valve issues. Dilated ascending aorta. Chest CTA August 2023 with 4.1 cm ascending aortic aneurysm. I saw him in the office on 07/08/23 and he c/o central chest pain at rest and with exertion. He was seen in the ED on 07/05/23 and had negative troponin. He also reported severe fatigue. Coronary CTA 07/24/23 with severe stenosis in the mid LAD and in the proximal to mid RCA.   He is here today for follow up. He has central chest pressure with exertion. He denies dyspnea, palpitations, lower extremity edema, orthopnea, PND, dizziness, near syncope or syncope. He has been on Imdur  but does thinks it makes his fatigue worse.    Primary Care Physician: Albin Huh, MD  Past Medical History:  Diagnosis Date   Asthma    CAD (coronary artery disease)    Cardiac cath 2002 with 25% distal RCA stenosis.    DDD (degenerative disc disease), lumbar    Diabetes mellitus (HCC)    Diverticulitis    Esophageal stricture    Gastritis 2010   GERD (gastroesophageal reflux disease)    GI bleed    Gout    HTN (hypertension)    Hyperlipidemia    Insomnia    MI (myocardial infarction) (HCC) 2009   no stent placement   OA (osteoarthritis)    Obesity    OSA on CPAP    Seasonal allergies    TIA (transient ischemic attack) 2013    Past Surgical  History:  Procedure Laterality Date   COLONOSCOPY WITH PROPOFOL  N/A 11/11/2018   Procedure: COLONOSCOPY WITH PROPOFOL ;  Surgeon: Lindle Rhea, MD;  [facility] ENDOSCOPY;  Service: Gastroenterology;  Laterality: N/A;   ESOPHAGOGASTRODUODENOSCOPY     multiple   NASAL SINUS SURGERY     POLYPECTOMY  11/11/2018   Procedure: POLYPECTOMY;  Surgeon: Lindle Rhea, MD;  Location: [facility] ENDOSCOPY;  Service: Gastroenterology;;   VASECTOMY      Current Outpatient Medications  Medication Sig Dispense Refill   acetaminophen  (TYLENOL ) 500 MG tablet Take 500 mg by mouth every 6 (six) hours as needed for moderate pain.     allopurinol  (ZYLOPRIM ) 100 MG tablet Take 1 tablet (100 mg total) by mouth daily. 60 tablet 3   amLODipine  (NORVASC ) 10 MG tablet TAKE 1 TABLET BY MOUTH ONCE DAILY WITH SUPPER 90 tablet 0   aspirin  81 MG chewable tablet Chew 1 tablet (81 mg total) by mouth daily. 30 tablet 2   carvedilol  (COREG ) 25 MG tablet TAKE 1 TABLET BY MOUTH ONCE DAILY WITH SUPPER 90 tablet 3   colchicine  0.6 MG tablet Take 1 tablet (0.6 mg total) by mouth daily. 6 tablet 1   Evolocumab  (REPATHA  SURECLICK) 140 MG/ML SOAJ INJECT 140 MG INTO THE SKIN EVERY 14 DAYS 2 mL 5   glucose blood test strip 1 each  by Other route daily. Use as instructed 100 each 12   irbesartan  (AVAPRO ) 150 MG tablet Take 1 tablet (150 mg total) by mouth daily. Please make overdue appt with Dr. Abel Hoe before anymore refills  Thank you 3rd and Final attempt 15 tablet 0   isosorbide  mononitrate (IMDUR ) 30 MG 24 hr tablet Take 1 tablet (30 mg total) by mouth daily. 90 tablet 3   naproxen  (NAPROSYN ) 500 MG tablet TAKE 1 TABLET BY MOUTH ONCE DAILY AS NEEDED FOR MODERATE PAIN 30 tablet 2   nitroGLYCERIN  (NITROSTAT ) 0.4 MG SL tablet Place 1 tablet (0.4 mg total) under the tongue every 5 (five) minutes as needed for chest pain. 25 tablet 3   omeprazole  (PRILOSEC) 40 MG capsule TAKE 1 CAPSULE BY MOUTH ONCE DAILY WITH SUPPER 90 capsule 0    triamcinolone  ointment (KENALOG ) 0.5 % APPLY OINTMENT TOPICALLY TO AFFECTED AREA AS NEEDED 15 g 2   No current facility-administered medications for this visit.    Allergies  Allergen Reactions   Atorvastatin  Other (See Comments)    Subjective myalgias. Gave 2 trials, developed myalgias which resolved after cessation of medication.  Muscle cramps were 9/10 discomfort   Codeine Nausea Only   Crestor  [Rosuvastatin ]     myalgias   Shellfish Allergy Other (See Comments)    Activates asthma    Social History   Socioeconomic History   Marital status: Widowed    Spouse name: Not on file   Number of children: 4   Years of education: 12   Highest education level: Not on file  Occupational History   Occupation: Disability-truck driver  Tobacco Use   Smoking status: Former    Current packs/day: 0.00    Average packs/day: 1.5 packs/day for 10.0 years (15.0 ttl pk-yrs)    Types: Cigarettes    Start date: 11/05/1981    Quit date: 11/06/1991    Years since quitting: 31.7   Smokeless tobacco: Never   Tobacco comments:    quit 1993  Vaping Use   Vaping status: Never Used  Substance and Sexual Activity   Alcohol use: No   Drug use: No   Sexual activity: Not on file  Other Topics Concern   Not on file  Social History Narrative   On disability   [employer]   Quit smoking 20 years ago (as of 01/2012)      Health Care POA:    Emergency Contact: brother, Debria Fang [phone]   End of Life Plan: gave Ad pamphlet 5/15   Who lives with you: self   Any pets: none   Diet: pt has a varied diet of protein, starch and vegetables.   Exercise: pt does not have regular exercise routine.   Seatbelts: Pt reports wearing seatbelt when in vehicles.    Hobbies: plays bass and keyboard in church music group         Social Drivers of Health   Financial Resource Strain: Low Risk  (07/10/2022)   Overall Financial Resource Strain (CARDIA)    Difficulty of Paying Living Expenses: Not hard at all  Food  Insecurity: No Food Insecurity (07/10/2022)   Hunger Vital Sign    Worried About Running Out of Food in the Last Year: Never true    Ran Out of Food in the Last Year: Never true  Transportation Needs: No Transportation Needs (07/10/2022)   PRAPARE - Administrator, Civil Service (Medical): No    Lack of Transportation (Non-Medical): No  Physical Activity: Inactive (07/10/2022)  Exercise Vital Sign    Days of Exercise per Week: 0 days    Minutes of Exercise per Session: 0 min  Stress: No Stress Concern Present (07/10/2022)   [employer] of Occupational Health - Occupational Stress Questionnaire    Feeling of Stress : Not at all  Social Connections: Moderately Integrated (07/10/2022)   Social Connection and Isolation Panel [NHANES]    Frequency of Communication with Friends and Family: More than three times a week    Frequency of Social Gatherings with Friends and Family: More than three times a week    Attends Religious Services: More than 4 times per year    Active Member of [employer] or Organizations: Yes    Attends Banker Meetings: More than 4 times per year    Marital Status: Widowed  Intimate Partner Violence: Not At Risk (07/10/2022)   Humiliation, Afraid, Rape, and Kick questionnaire    Fear of Current or Ex-Partner: No    Emotionally Abused: No    Physically Abused: No    Sexually Abused: No    Family History  Problem Relation Age of Onset   Heart attack Mother    Heart failure Mother    Colon cancer Neg Hx    Esophageal cancer Neg Hx    Rectal cancer Neg Hx    Stomach cancer Neg Hx     Review of Systems:  As stated in the HPI and otherwise negative.   BP 118/86   Pulse 82   Ht 5\' 7"  (1.702 m)   Wt (!) 325 lb (147.4 kg)   SpO2 96%   BMI 50.90 kg/m   Physical Examination:  General: Well developed, well nourished, NAD  HEENT: OP clear, mucus membranes moist  SKIN: warm, dry. No rashes. Neuro: No focal deficits  Musculoskeletal:  Muscle strength 5/5 all ext  Psychiatric: Mood and affect normal  Neck: No JVD, no carotid bruits, no thyromegaly, no lymphadenopathy.  Lungs:Clear bilaterally, no wheezes, rhonci, crackles Cardiovascular: Regular rate and rhythm. No murmurs, gallops or rubs. Abdomen:Soft. Bowel sounds present. Non-tender.  Extremities: No lower extremity edema. Pulses are 2 + in the bilateral DP/PT.  EKG:  EKG is  ordered today. The ekg ordered today demonstrates  EKG Interpretation Date/Time:  Monday Jul 29 2023 16:25:47 EDT Ventricular Rate:  71 PR Interval:  226 QRS Duration:  100 QT Interval:  392 QTC Calculation: 425 R Axis:   -11  Text Interpretation: Sinus rhythm with 1st degree A-V block Minimal voltage criteria for LVH, may be normal variant ( R in aVL ) Confirmed by Antoinette Batman [phone]) on 07/29/2023 4:46:22 PM    Recent Labs: 07/05/2023: Hemoglobin 12.9; Magnesium 1.8; Platelets 298 07/08/2023: BUN 21; Creatinine, Ser 1.15; Potassium 4.5; Sodium 142   Lipid Panel    Component Value Date/Time   CHOL 115 02/18/2023 1436   TRIG 213 (H) 02/18/2023 1436   HDL 30 (L) 02/18/2023 1436   CHOLHDL 3.8 02/18/2023 1436   CHOLHDL 8.4 09/06/2017 0512   VLDL 39 09/06/2017 0512   LDLCALC 50 02/18/2023 1436   LDLDIRECT 130 (H) 02/22/2012 0910     Wt Readings from Last 3 Encounters:  07/29/23 (!) 325 lb (147.4 kg)  07/08/23 (!) 322 lb 9.6 oz (146.3 kg)  07/05/23 (!) 321 lb 14 oz (146 kg)    Assessment and Plan:   1.CAD with angina: Recent coronary CTA with severe mid LAD and severe proximal RCA stenosis. He has chest pain c/w angina. Cardiac cath  is indicated.  I have reviewed the risks, indications, and alternatives to cardiac catheterization, possible angioplasty, and stenting with the patient. Risks include but are not limited to bleeding, infection, vascular injury, stroke, myocardial infection, arrhythmia, kidney injury, radiation-related injury in the case of prolonged fluoroscopy  use, emergency cardiac surgery, and death. The patient understands the risks of serious complication is 1-2 in 1000 with diagnostic cardiac cath and 1-2% or less with angioplasty/stenting. He does not tolerate statins. His ASA was stopped when he had diverticulitis.  - Plan cardiac [facility] at [facility] on 08/14/23 at 11:30 am.  -Restart ASA -Continue Coreg .  -Stop Imdur  secondary to fatigue   2.  Hyperlipidemia: He is statin intolerant. He is tolerating Repatha . LDL 50 in December 2024.    3. HTN: BP is controlled. No changes today   4. Sleep apnea: He wears CPAP   5. Thoracic aortic aneurysm:  4.5 x 4.3  cm ascending aorta by chest CTA May 2025. Repeat in one year.   Labs/ tests ordered today include:   Orders Placed This Encounter  Procedures   EKG 12-Lead   Disposition:   F/U 4 weeks post cath.   Signed, Antoinette Batman, MD 07/29/2023 5:00 PM    [facility] [address] [place], [place], [place]  [address] Phone: [phone]; Fax: [phone]

## 2023-07-29 NOTE — H&P (View-Only) (Signed)
 Chief Complaint  Patient presents with   Follow-up    CAD   History of Present Illness: 68 yo male with history of mild CAD, chronic diastolic CHF, DM, GERD, gout, HTN, Hyperlipidemia, obesity, sleep apnea, prior TIA who is here today for follow up. Cardiac cath in 2002 showed 25% stenosis in the distal RCA. Echo in 2013 showed normal LV size and function with LVEF of 55-65% and moderate LVH. There were no significant valvular issues. Stress myoview  in 2013 with no ischemia. He has not tolerated statins due to muscle weakness but is now on Repatha . Echo April 2023 with normal LV size and function with LVEF=50-55% with inferior wall hypokinesis, moderate LVH, no valve issues. Dilated ascending aorta. Chest CTA August 2023 with 4.1 cm ascending aortic aneurysm. I saw him in the office on 07/08/23 and he c/o central chest pain at rest and with exertion. He was seen in the ED on 07/05/23 and had negative troponin. He also reported severe fatigue. Coronary CTA 07/24/23 with severe stenosis in the mid LAD and in the proximal to mid RCA.   He is here today for follow up. He has central chest pressure with exertion. He denies dyspnea, palpitations, lower extremity edema, orthopnea, PND, dizziness, near syncope or syncope. He has been on Imdur  but does thinks it makes his fatigue worse.    Primary Care Physician: Albin Huh, MD  Past Medical History:  Diagnosis Date   Asthma    CAD (coronary artery disease)    Cardiac cath 2002 with 25% distal RCA stenosis.    DDD (degenerative disc disease), lumbar    Diabetes mellitus (HCC)    Diverticulitis    Esophageal stricture    Gastritis 2010   GERD (gastroesophageal reflux disease)    GI bleed    Gout    HTN (hypertension)    Hyperlipidemia    Insomnia    MI (myocardial infarction) (HCC) 2009   no stent placement   OA (osteoarthritis)    Obesity    OSA on CPAP    Seasonal allergies    TIA (transient ischemic attack) 2013    Past Surgical  History:  Procedure Laterality Date   COLONOSCOPY WITH PROPOFOL  N/A 11/11/2018   Procedure: COLONOSCOPY WITH PROPOFOL ;  Surgeon: Lindle Rhea, MD;  Location: [facility] ENDOSCOPY;  Service: Gastroenterology;  Laterality: N/A;   ESOPHAGOGASTRODUODENOSCOPY     multiple   NASAL SINUS SURGERY     POLYPECTOMY  11/11/2018   Procedure: POLYPECTOMY;  Surgeon: Lindle Rhea, MD;  Location: [facility] ENDOSCOPY;  Service: Gastroenterology;;   VASECTOMY      Current Outpatient Medications  Medication Sig Dispense Refill   acetaminophen  (TYLENOL ) 500 MG tablet Take 500 mg by mouth every 6 (six) hours as needed for moderate pain.     allopurinol  (ZYLOPRIM ) 100 MG tablet Take 1 tablet (100 mg total) by mouth daily. 60 tablet 3   amLODipine  (NORVASC ) 10 MG tablet TAKE 1 TABLET BY MOUTH ONCE DAILY WITH SUPPER 90 tablet 0   aspirin  81 MG chewable tablet Chew 1 tablet (81 mg total) by mouth daily. 30 tablet 2   carvedilol  (COREG ) 25 MG tablet TAKE 1 TABLET BY MOUTH ONCE DAILY WITH SUPPER 90 tablet 3   colchicine  0.6 MG tablet Take 1 tablet (0.6 mg total) by mouth daily. 6 tablet 1   Evolocumab  (REPATHA  SURECLICK) 140 MG/ML SOAJ INJECT 140 MG INTO THE SKIN EVERY 14 DAYS 2 mL 5   glucose blood test strip 1 each  by Other route daily. Use as instructed 100 each 12   irbesartan  (AVAPRO ) 150 MG tablet Take 1 tablet (150 mg total) by mouth daily. Please make overdue appt with Dr. Abel Hoe before anymore refills  Thank you 3rd and Final attempt 15 tablet 0   isosorbide  mononitrate (IMDUR ) 30 MG 24 hr tablet Take 1 tablet (30 mg total) by mouth daily. 90 tablet 3   naproxen  (NAPROSYN ) 500 MG tablet TAKE 1 TABLET BY MOUTH ONCE DAILY AS NEEDED FOR MODERATE PAIN 30 tablet 2   nitroGLYCERIN  (NITROSTAT ) 0.4 MG SL tablet Place 1 tablet (0.4 mg total) under the tongue every 5 (five) minutes as needed for chest pain. 25 tablet 3   omeprazole  (PRILOSEC) 40 MG capsule TAKE 1 CAPSULE BY MOUTH ONCE DAILY WITH SUPPER 90 capsule 0    triamcinolone  ointment (KENALOG ) 0.5 % APPLY OINTMENT TOPICALLY TO AFFECTED AREA AS NEEDED 15 g 2   No current facility-administered medications for this visit.    Allergies  Allergen Reactions   Atorvastatin  Other (See Comments)    Subjective myalgias. Gave 2 trials, developed myalgias which resolved after cessation of medication.  Muscle cramps were 9/10 discomfort   Codeine Nausea Only   Crestor  [Rosuvastatin ]     myalgias   Shellfish Allergy Other (See Comments)    Activates asthma    Social History   Socioeconomic History   Marital status: Widowed    Spouse name: Not on file   Number of children: 4   Years of education: 12   Highest education level: Not on file  Occupational History   Occupation: Disability-truck driver  Tobacco Use   Smoking status: Former    Current packs/day: 0.00    Average packs/day: 1.5 packs/day for 10.0 years (15.0 ttl pk-yrs)    Types: Cigarettes    Start date: 11/05/1981    Quit date: 11/06/1991    Years since quitting: 31.7   Smokeless tobacco: Never   Tobacco comments:    quit 1993  Vaping Use   Vaping status: Never Used  Substance and Sexual Activity   Alcohol use: No   Drug use: No   Sexual activity: Not on file  Other Topics Concern   Not on file  Social History Narrative   On disability   [employer]   Quit smoking 20 years ago (as of 01/2012)      Health Care POA:    Emergency Contact: brother, Debria Fang [phone]   End of Life Plan: gave Ad pamphlet 5/15   Who lives with you: self   Any pets: none   Diet: pt has a varied diet of protein, starch and vegetables.   Exercise: pt does not have regular exercise routine.   Seatbelts: Pt reports wearing seatbelt when in vehicles.    Hobbies: plays bass and keyboard in church music group         Social Drivers of Health   Financial Resource Strain: Low Risk  (07/10/2022)   Overall Financial Resource Strain (CARDIA)    Difficulty of Paying Living Expenses: Not hard at all  Food  Insecurity: No Food Insecurity (07/10/2022)   Hunger Vital Sign    Worried About Running Out of Food in the Last Year: Never true    Ran Out of Food in the Last Year: Never true  Transportation Needs: No Transportation Needs (07/10/2022)   PRAPARE - Administrator, Civil Service (Medical): No    Lack of Transportation (Non-Medical): No  Physical Activity: Inactive (07/10/2022)  Exercise Vital Sign    Days of Exercise per Week: 0 days    Minutes of Exercise per Session: 0 min  Stress: No Stress Concern Present (07/10/2022)   [employer] of Occupational Health - Occupational Stress Questionnaire    Feeling of Stress : Not at all  Social Connections: Moderately Integrated (07/10/2022)   Social Connection and Isolation Panel [NHANES]    Frequency of Communication with Friends and Family: More than three times a week    Frequency of Social Gatherings with Friends and Family: More than three times a week    Attends Religious Services: More than 4 times per year    Active Member of [employer] or Organizations: Yes    Attends Banker Meetings: More than 4 times per year    Marital Status: Widowed  Intimate Partner Violence: Not At Risk (07/10/2022)   Humiliation, Afraid, Rape, and Kick questionnaire    Fear of Current or Ex-Partner: No    Emotionally Abused: No    Physically Abused: No    Sexually Abused: No    Family History  Problem Relation Age of Onset   Heart attack Mother    Heart failure Mother    Colon cancer Neg Hx    Esophageal cancer Neg Hx    Rectal cancer Neg Hx    Stomach cancer Neg Hx     Review of Systems:  As stated in the HPI and otherwise negative.   BP 118/86   Pulse 82   Ht 5\' 7"  (1.702 m)   Wt (!) 325 lb (147.4 kg)   SpO2 96%   BMI 50.90 kg/m   Physical Examination:  General: Well developed, well nourished, NAD  HEENT: OP clear, mucus membranes moist  SKIN: warm, dry. No rashes. Neuro: No focal deficits  Musculoskeletal:  Muscle strength 5/5 all ext  Psychiatric: Mood and affect normal  Neck: No JVD, no carotid bruits, no thyromegaly, no lymphadenopathy.  Lungs:Clear bilaterally, no wheezes, rhonci, crackles Cardiovascular: Regular rate and rhythm. No murmurs, gallops or rubs. Abdomen:Soft. Bowel sounds present. Non-tender.  Extremities: No lower extremity edema. Pulses are 2 + in the bilateral DP/PT.  EKG:  EKG is  ordered today. The ekg ordered today demonstrates  EKG Interpretation Date/Time:  Monday Jul 29 2023 16:25:47 EDT Ventricular Rate:  71 PR Interval:  226 QRS Duration:  100 QT Interval:  392 QTC Calculation: 425 R Axis:   -11  Text Interpretation: Sinus rhythm with 1st degree A-V block Minimal voltage criteria for LVH, may be normal variant ( R in aVL ) Confirmed by Antoinette Batman [phone]) on 07/29/2023 4:46:22 PM    Recent Labs: 07/05/2023: Hemoglobin 12.9; Magnesium 1.8; Platelets 298 07/08/2023: BUN 21; Creatinine, Ser 1.15; Potassium 4.5; Sodium 142   Lipid Panel    Component Value Date/Time   CHOL 115 02/18/2023 1436   TRIG 213 (H) 02/18/2023 1436   HDL 30 (L) 02/18/2023 1436   CHOLHDL 3.8 02/18/2023 1436   CHOLHDL 8.4 09/06/2017 0512   VLDL 39 09/06/2017 0512   LDLCALC 50 02/18/2023 1436   LDLDIRECT 130 (H) 02/22/2012 0910     Wt Readings from Last 3 Encounters:  07/29/23 (!) 325 lb (147.4 kg)  07/08/23 (!) 322 lb 9.6 oz (146.3 kg)  07/05/23 (!) 321 lb 14 oz (146 kg)    Assessment and Plan:   1.CAD with angina: Recent coronary CTA with severe mid LAD and severe proximal RCA stenosis. He has chest pain c/w angina. Cardiac cath  is indicated.  I have reviewed the risks, indications, and alternatives to cardiac catheterization, possible angioplasty, and stenting with the patient. Risks include but are not limited to bleeding, infection, vascular injury, stroke, myocardial infection, arrhythmia, kidney injury, radiation-related injury in the case of prolonged fluoroscopy  use, emergency cardiac surgery, and death. The patient understands the risks of serious complication is 1-2 in 1000 with diagnostic cardiac cath and 1-2% or less with angioplasty/stenting. He does not tolerate statins. His ASA was stopped when he had diverticulitis.  - Plan cardiac cath at [facility] on 08/14/23 at 11:30 am.  -Restart ASA -Continue Coreg .  -Stop Imdur  secondary to fatigue   2.  Hyperlipidemia: He is statin intolerant. He is tolerating Repatha . LDL 50 in December 2024.    3. HTN: BP is controlled. No changes today   4. Sleep apnea: He wears CPAP   5. Thoracic aortic aneurysm:  4.5 x 4.3  cm ascending aorta by chest CTA May 2025. Repeat in one year.   Labs/ tests ordered today include:   Orders Placed This Encounter  Procedures   EKG 12-Lead   Disposition:   F/U 4 weeks post cath.   Signed, Antoinette Batman, MD 07/29/2023 5:00 PM    [facility] [address] [place], [place], [place]  [address] Phone: [phone]; Fax: [phone]

## 2023-07-29 NOTE — Patient Instructions (Signed)
 Medication Instructions:  No changes *If you need a refill on your cardiac medications before your next appointment, please call your pharmacy*  Lab Work:  Testing/Procedures: Your physician has requested that you have a cardiac catheterization. Cardiac catheterization is used to diagnose and/or treat various heart conditions. Doctors may recommend this procedure for a number of different reasons. The most common reason is to evaluate chest pain. Chest pain can be a symptom of coronary artery disease (CAD), and cardiac catheterization can show whether plaque is narrowing or blocking your heart's arteries. This procedure is also used to evaluate the valves, as well as measure the blood flow and oxygen levels in different parts of your heart. For further information please visit [email or web address] Please follow instruction sheet, as given.   Follow-Up: At [facility], you and your health needs are our priority.  As part of our continuing mission to provide you with exceptional heart care, our providers are all part of one team.  This team includes your primary Cardiologist (physician) and Advanced Practice Providers or APPs (Physician Assistants and Nurse Practitioners) who all work together to provide you with the care you need, when you need it.  Your next appointment:   4-6 week(s)  Provider:   Antoinette Batman, MD or an Advanced Practice Provider          Cardiac/Peripheral Catheterization   You are scheduled for a Cardiac Catheterization on Wednesday, June 4 with Dr. Antoinette Batman.  1. Please arrive at the [facility] (Main Entrance A) at [facility]: [address] [place], [place] [address] at 6:30 AM (This time is TWO hour(s) before your procedure to ensure your preparation).   Free valet parking service is available. You will check in at ADMITTING. The support person will be asked to wait in the waiting room.  It is OK to have someone drop you off  and come back when you are ready to be discharged.        Special note: Every effort is made to have your procedure done on time. Please understand that emergencies sometimes delay scheduled procedures.  2. Diet: Do not eat solid foods after midnight.  You may have clear liquids until 5 AM the day of the procedure.  3. Labs: completed  4. Medication instructions in preparation for your procedure:   Contrast Allergy: No  No irbesartan  on day of procedure.  On the morning of your procedure, take Aspirin  81 mg and any morning medicines NOT listed above.  You may use sips of water.  5. Plan to go home the same day, you will only stay overnight if medically necessary. 6. You MUST have a responsible adult to drive you home. 7. An adult MUST be with you the first 24 hours after you arrive home. 8. Bring a current list of your medications, and the last time and date medication taken. 9. Bring ID and current insurance cards. 10.Please wear clothes that are easy to get on and off and wear slip-on shoes.  Thank you for allowing us  to care for you!   -- [facility]

## 2023-07-30 ENCOUNTER — Other Ambulatory Visit: Payer: Self-pay | Admitting: Cardiovascular Disease

## 2023-07-30 DIAGNOSIS — Z8673 Personal history of transient ischemic attack (TIA), and cerebral infarction without residual deficits: Secondary | ICD-10-CM

## 2023-07-30 DIAGNOSIS — E7800 Pure hypercholesterolemia, unspecified: Secondary | ICD-10-CM

## 2023-07-30 DIAGNOSIS — E785 Hyperlipidemia, unspecified: Secondary | ICD-10-CM

## 2023-08-06 ENCOUNTER — Other Ambulatory Visit: Payer: Self-pay | Admitting: Family Medicine

## 2023-08-07 ENCOUNTER — Telehealth: Payer: Self-pay | Admitting: *Deleted

## 2023-08-07 ENCOUNTER — Other Ambulatory Visit: Payer: Self-pay | Admitting: *Deleted

## 2023-08-07 DIAGNOSIS — Z01812 Encounter for preprocedural laboratory examination: Secondary | ICD-10-CM

## 2023-08-07 DIAGNOSIS — I25110 Atherosclerotic heart disease of native coronary artery with unstable angina pectoris: Secondary | ICD-10-CM

## 2023-08-07 DIAGNOSIS — R931 Abnormal findings on diagnostic imaging of heart and coronary circulation: Secondary | ICD-10-CM

## 2023-08-07 NOTE — Telephone Encounter (Addendum)
 Cardiac Catheterization scheduled at [facility] for: Wednesday August 14, 2023 8:30 AM Arrival time [facility] Main Entrance A at: 6:30 AM  Nothing to eat after midnight prior to procedure, clear liquids until 5 AM day of procedure.  Medication instructions: -Usual morning medications can be taken with sips of water including aspirin  81 mg.  Plan to go home the same day, you will only stay overnight if medically necessary.  You must have responsible adult to drive you home.  Someone must be with you the first 24 hours after you arrive home.  Epic/[facility] DXA-last BMP 07/08/23/last CBC 07/05/23 -pt tells me he does not think he has had more recent lab -will go to [facility] lab 08/08/23 to update BMP/CBC prior to cath 08/14/23.

## 2023-08-08 LAB — BASIC METABOLIC PANEL WITH GFR
BUN/Creatinine Ratio: 16 (ref 10–24)
BUN: 19 mg/dL (ref 8–27)
CO2: 19 mmol/L — ABNORMAL LOW (ref 20–29)
Calcium: 9.6 mg/dL (ref 8.6–10.2)
Chloride: 102 mmol/L (ref 96–106)
Creatinine, Ser: 1.17 mg/dL (ref 0.76–1.27)
Glucose: 109 mg/dL — ABNORMAL HIGH (ref 70–99)
Potassium: 4.6 mmol/L (ref 3.5–5.2)
Sodium: 139 mmol/L (ref 134–144)
eGFR: 68 mL/min/{1.73_m2} (ref >59)

## 2023-08-09 ENCOUNTER — Ambulatory Visit: Payer: Self-pay | Admitting: Cardiovascular Disease

## 2023-08-09 LAB — CBC
Hematocrit: 40.3 % (ref 37.5–51.0)
Hemoglobin: 12.9 g/dL — ABNORMAL LOW (ref 13.0–17.7)
MCH: 25.9 pg — ABNORMAL LOW (ref 26.6–33.0)
MCHC: 32 g/dL (ref 31.5–35.7)
MCV: 81 fL (ref 79–97)
Platelets: 325 x10E3/uL (ref 150–450)
RBC: 4.99 x10E6/uL (ref 4.14–5.80)
RDW: 14.5 % (ref 11.6–15.4)
WBC: 7.9 x10E3/uL (ref 3.4–10.8)

## 2023-08-12 ENCOUNTER — Telehealth: Payer: Self-pay | Admitting: *Deleted

## 2023-08-12 NOTE — Telephone Encounter (Signed)
 Cardiac Catheterization scheduled at [facility] for: Wednesday August 14, 2023 8:30 AM Arrival time [facility] Main Entrance A at: 6:30 AM  Nothing to eat after midnight prior to procedure, clear liquids until 5 AM day of procedure.  Medication instructions: -Usual morning medications can be taken with sips of water including aspirin  81 mg.  Plan to go home the same day, you will only stay overnight if medically necessary.  You must have responsible adult to drive you home.  Someone must be with you the first 24 hours after you arrive home.  Reviewed procedure instructions with patient.

## 2023-08-14 ENCOUNTER — Other Ambulatory Visit: Payer: Self-pay

## 2023-08-14 ENCOUNTER — Encounter (HOSPITAL_COMMUNITY): Payer: Self-pay | Admitting: Cardiovascular Disease

## 2023-08-14 ENCOUNTER — Encounter (HOSPITAL_COMMUNITY)
Admission: RE | Disposition: A | Discharge status: Home / Self Care | Payer: Self-pay | Source: Home / Self Care | Attending: Cardiovascular Disease

## 2023-08-14 ENCOUNTER — Ambulatory Visit (HOSPITAL_COMMUNITY)
Admission: RE | Admit: 2023-08-14 | Discharge: 2023-08-15 | Disposition: A | Discharge status: Home / Self Care | Payer: Medicare (Managed Care) | Attending: Cardiovascular Disease | Admitting: Cardiovascular Disease

## 2023-08-14 DIAGNOSIS — K219 Gastro-esophageal reflux disease without esophagitis: Secondary | ICD-10-CM | POA: Diagnosis not present

## 2023-08-14 DIAGNOSIS — M109 Gout, unspecified: Secondary | ICD-10-CM | POA: Diagnosis not present

## 2023-08-14 DIAGNOSIS — I5032 Chronic diastolic (congestive) heart failure: Secondary | ICD-10-CM | POA: Insufficient documentation

## 2023-08-14 DIAGNOSIS — I110 Hypertensive heart disease with heart failure: Secondary | ICD-10-CM | POA: Insufficient documentation

## 2023-08-14 DIAGNOSIS — I7121 Aneurysm of the ascending aorta, without rupture: Secondary | ICD-10-CM | POA: Diagnosis not present

## 2023-08-14 DIAGNOSIS — D649 Anemia, unspecified: Secondary | ICD-10-CM | POA: Diagnosis not present

## 2023-08-14 DIAGNOSIS — Z79899 Other long term (current) drug therapy: Secondary | ICD-10-CM | POA: Insufficient documentation

## 2023-08-14 DIAGNOSIS — E785 Hyperlipidemia, unspecified: Secondary | ICD-10-CM | POA: Insufficient documentation

## 2023-08-14 DIAGNOSIS — Z7982 Long term (current) use of aspirin: Secondary | ICD-10-CM | POA: Insufficient documentation

## 2023-08-14 DIAGNOSIS — Z87891 Personal history of nicotine dependence: Secondary | ICD-10-CM | POA: Insufficient documentation

## 2023-08-14 DIAGNOSIS — Z8673 Personal history of transient ischemic attack (TIA), and cerebral infarction without residual deficits: Secondary | ICD-10-CM | POA: Insufficient documentation

## 2023-08-14 DIAGNOSIS — E119 Type 2 diabetes mellitus without complications: Secondary | ICD-10-CM | POA: Diagnosis not present

## 2023-08-14 DIAGNOSIS — G4733 Obstructive sleep apnea (adult) (pediatric): Secondary | ICD-10-CM | POA: Diagnosis not present

## 2023-08-14 DIAGNOSIS — Z6841 Body Mass Index (BMI) 40.0 and over, adult: Secondary | ICD-10-CM | POA: Diagnosis not present

## 2023-08-14 DIAGNOSIS — I200 Unstable angina: Secondary | ICD-10-CM | POA: Diagnosis present

## 2023-08-14 DIAGNOSIS — I25110 Atherosclerotic heart disease of native coronary artery with unstable angina pectoris: Secondary | ICD-10-CM | POA: Diagnosis not present

## 2023-08-14 DIAGNOSIS — M10029 Idiopathic gout, unspecified elbow: Secondary | ICD-10-CM

## 2023-08-14 HISTORY — PX: CORONARY STENT INTERVENTION: CATH118234

## 2023-08-14 HISTORY — PX: LEFT HEART CATH AND CORONARY ANGIOGRAPHY: CATH118249

## 2023-08-14 LAB — GLUCOSE, CAPILLARY: Glucose-Capillary: 115 mg/dL — ABNORMAL HIGH (ref 70–99)

## 2023-08-14 LAB — POCT ACTIVATED CLOTTING TIME
Activated Clotting Time: 239 s
Activated Clotting Time: 279 s

## 2023-08-14 MED ORDER — VERAPAMIL HCL 2.5 MG/ML IV SOLN
INTRAVENOUS | Status: DC | PRN
  Administered 2023-08-14: 10 mL via INTRA_ARTERIAL

## 2023-08-14 MED ORDER — VERAPAMIL HCL 2.5 MG/ML IV SOLN
INTRAVENOUS | Status: AC
  Filled 2023-08-14: qty 2

## 2023-08-14 MED ORDER — MIDAZOLAM HCL 2 MG/2ML IJ SOLN
INTRAMUSCULAR | Status: AC
  Filled 2023-08-14: qty 2

## 2023-08-14 MED ORDER — MIDAZOLAM HCL 2 MG/2ML IJ SOLN
INTRAMUSCULAR | Status: DC | PRN
  Administered 2023-08-14 (×2): 2 mg via INTRAVENOUS

## 2023-08-14 MED ORDER — SODIUM CHLORIDE 0.9 % IV SOLN: 250.0000 mL | INTRAVENOUS | Status: DC | PRN

## 2023-08-14 MED ORDER — ASPIRIN 81 MG PO CHEW
81.0000 mg | CHEWABLE_TABLET | Freq: Every day | ORAL | Status: DC
  Administered 2023-08-15: 81 mg via ORAL
  Filled 2023-08-14: qty 1

## 2023-08-14 MED ORDER — HEPARIN SODIUM (PORCINE) 1000 UNIT/ML IJ SOLN
INTRAMUSCULAR | Status: DC | PRN
  Administered 2023-08-14 (×2): 8000 [IU] via INTRAVENOUS
  Administered 2023-08-14: 4000 [IU] via INTRAVENOUS

## 2023-08-14 MED ORDER — AMLODIPINE BESYLATE 5 MG PO TABS: 10.0000 mg | ORAL_TABLET | Freq: Every day | ORAL | Status: DC

## 2023-08-14 MED ORDER — IOHEXOL 350 MG/ML SOLN
INTRAVENOUS | Status: DC | PRN
  Administered 2023-08-14: 165 mL

## 2023-08-14 MED ORDER — ONDANSETRON HCL 4 MG/2ML IJ SOLN: 4.0000 mg | Freq: Four times a day (QID) | INTRAMUSCULAR | Status: DC | PRN

## 2023-08-14 MED ORDER — FENTANYL CITRATE (PF) 100 MCG/2ML IJ SOLN
INTRAMUSCULAR | Status: DC | PRN
  Administered 2023-08-14 (×2): 50 ug via INTRAVENOUS

## 2023-08-14 MED ORDER — PANTOPRAZOLE SODIUM 40 MG PO TBEC
40.0000 mg | DELAYED_RELEASE_TABLET | Freq: Every day | ORAL | Status: DC
  Administered 2023-08-15: 40 mg via ORAL
  Filled 2023-08-14: qty 1

## 2023-08-14 MED ORDER — SODIUM CHLORIDE 0.9 % WEIGHT BASED INFUSION: 1.0000 mL/kg/h | INTRAVENOUS | Status: DC

## 2023-08-14 MED ORDER — HEPARIN SODIUM (PORCINE) 1000 UNIT/ML IJ SOLN
INTRAMUSCULAR | Status: AC
  Filled 2023-08-14: qty 10

## 2023-08-14 MED ORDER — PRASUGREL HCL 10 MG PO TABS
10.0000 mg | ORAL_TABLET | Freq: Every day | ORAL | Status: DC
  Administered 2023-08-15: 10 mg via ORAL
  Filled 2023-08-14: qty 1

## 2023-08-14 MED ORDER — PRASUGREL HCL 10 MG PO TABS
ORAL_TABLET | ORAL | Status: AC
Start: 2023-08-14 — End: ?
  Filled 2023-08-14: qty 6

## 2023-08-14 MED ORDER — PRASUGREL HCL 10 MG PO TABS
ORAL_TABLET | ORAL | Status: DC | PRN
  Administered 2023-08-14: 60 mg via ORAL

## 2023-08-14 MED ORDER — ACETAMINOPHEN 325 MG PO TABS: 650.0000 mg | ORAL_TABLET | ORAL | Status: DC | PRN

## 2023-08-14 MED ORDER — SODIUM CHLORIDE 0.9 % IV SOLN: INTRAVENOUS | Status: AC

## 2023-08-14 MED ORDER — FENTANYL CITRATE (PF) 100 MCG/2ML IJ SOLN
INTRAMUSCULAR | Status: AC
  Filled 2023-08-14: qty 2

## 2023-08-14 MED ORDER — CARVEDILOL 25 MG PO TABS
25.0000 mg | ORAL_TABLET | Freq: Every day | ORAL | Status: DC
  Administered 2023-08-14: 25 mg via ORAL
  Filled 2023-08-14: qty 1

## 2023-08-14 MED ORDER — LABETALOL HCL 5 MG/ML IV SOLN: 10.0000 mg | INTRAVENOUS | Status: AC | PRN

## 2023-08-14 MED ORDER — NITROGLYCERIN 0.4 MG SL SUBL: 0.4000 mg | SUBLINGUAL_TABLET | SUBLINGUAL | Status: DC | PRN

## 2023-08-14 MED ORDER — HEPARIN (PORCINE) IN NACL 1000-0.9 UT/500ML-% IV SOLN
INTRAVENOUS | Status: DC | PRN
  Administered 2023-08-14 (×3): 500 mL

## 2023-08-14 MED ORDER — SODIUM CHLORIDE 0.9% FLUSH
3.0000 mL | INTRAVENOUS | Status: DC | PRN
Start: 2023-08-14 — End: 2023-08-15

## 2023-08-14 MED ORDER — SODIUM CHLORIDE 0.9 % WEIGHT BASED INFUSION: 3.0000 mL/kg/h | INTRAVENOUS | Status: DC

## 2023-08-14 MED ORDER — HYDRALAZINE HCL 20 MG/ML IJ SOLN: 10.0000 mg | INTRAMUSCULAR | Status: AC | PRN

## 2023-08-14 MED ORDER — IRBESARTAN 150 MG PO TABS
150.0000 mg | ORAL_TABLET | Freq: Every day | ORAL | Status: DC
  Administered 2023-08-15: 150 mg via ORAL
  Filled 2023-08-14 (×2): qty 1

## 2023-08-14 MED ORDER — ASPIRIN 81 MG PO CHEW: 81.0000 mg | CHEWABLE_TABLET | ORAL | Status: DC

## 2023-08-14 MED ORDER — SODIUM CHLORIDE 0.9% FLUSH
3.0000 mL | Freq: Two times a day (BID) | INTRAVENOUS | Status: DC
  Administered 2023-08-14 – 2023-08-15 (×3): 3 mL via INTRAVENOUS

## 2023-08-14 MED ORDER — LIDOCAINE HCL (PF) 1 % IJ SOLN
INTRAMUSCULAR | Status: DC | PRN
Start: 2023-08-14 — End: 2023-08-14
  Administered 2023-08-14: 5 mL via INTRADERMAL
  Administered 2023-08-14: 2 mL via INTRADERMAL

## 2023-08-14 MED ORDER — LIDOCAINE HCL (PF) 1 % IJ SOLN
INTRAMUSCULAR | Status: AC
  Filled 2023-08-14: qty 30

## 2023-08-14 SURGICAL SUPPLY — 22 items
BALLOON EMERGE MR 2.5X15 (BALLOONS) IMPLANT
BALLOON ~~LOC~~ EMERGE MR 4.5X15 (BALLOONS) IMPLANT
CATH 5FR JL3.5 JR4 ANG PIG MP (CATHETERS) IMPLANT
CATH INFINITI 5F JL4 125CM (CATHETERS) IMPLANT
CATH INFINITI 5FR JL4 (CATHETERS) IMPLANT
CATH INFINITI 5FR JR4 125CM (CATHETERS) IMPLANT
CATH VISTA GUIDE 6FR XBLAD4 (CATHETERS) IMPLANT
CLOSURE PERCLOSE PROSTYLE (VASCULAR PRODUCTS) IMPLANT
DEVICE RAD COMP TR BAND LRG (VASCULAR PRODUCTS) IMPLANT
GLIDESHEATH SLEND SS 6F .021 (SHEATH) IMPLANT
GUIDEWIRE INQWIRE 1.5J.035X260 (WIRE) IMPLANT
KIT ENCORE 26 ADVANTAGE (KITS) IMPLANT
KIT MICROPUNCTURE NIT STIFF (SHEATH) IMPLANT
KIT SYRINGE INJ CVI SPIKEX1 (MISCELLANEOUS) IMPLANT
PACK CARDIAC CATHETERIZATION (CUSTOM PROCEDURE TRAY) ×1 IMPLANT
SET ATX-X65L (MISCELLANEOUS) IMPLANT
SHEATH 6FR 85 DEST SLENDER (SHEATH) IMPLANT
SHEATH PINNACLE 6F 10CM (SHEATH) IMPLANT
SHEATH PROBE COVER 6X72 (BAG) IMPLANT
STENT SYNERGY XD 4.0X20 (Permanent Stent) IMPLANT
TUBING CIL FLEX 10 FLL-RA (TUBING) IMPLANT
WIRE ASAHI PROWATER 180CM (WIRE) IMPLANT

## 2023-08-14 NOTE — Progress Notes (Signed)
 Pt remains in reverse trendelenburg position, this RN checked right groin, scant reddish drainage noted on gauze, area "pushed" with fingers, amount of reddish drainage increased, drsg removed, area noted to be harder then previously, manual pressure applied for approximately 10 minutes, blood expressed, bedrest to restart at 1115, Dr. Abel Hoe notified through [place], [place], will inform him of situation, will continue to monitor

## 2023-08-14 NOTE — Interval H&P Note (Signed)
 History and Physical Interval Note:  08/14/2023 7:38 AM  Alex [name]  has presented today for surgery, with the diagnosis of unstable angina.  The various methods of treatment have been discussed with the patient and family. After consideration of risks, benefits and other options for treatment, the patient has consented to  Procedure(s): LEFT HEART CATH AND CORONARY ANGIOGRAPHY (N/A) as a surgical intervention.  The patient's history has been reviewed, patient examined, no change in status, stable for surgery.  I have reviewed the patient's chart and labs.  Questions were answered to the patient's satisfaction.    Cath Lab Visit (complete for each Cath Lab visit)  Clinical Evaluation Leading to the Procedure:   ACS: No.  Non-ACS:    Anginal Classification: CCS III  Anti-ischemic medical therapy: Maximal Therapy (2 or more classes of medications)  Non-Invasive Test Results: High-risk stress test findings: cardiac mortality >3%/year (Coronary CTA with possible high grade LAD and RCA stenosis)  Prior CABG: No previous CABG   Antoinette Batman

## 2023-08-14 NOTE — Progress Notes (Signed)
 TR BAND REMOVAL  LOCATION:    Right radial  DEFLATED PER PROTOCOL:   Yes  TIME BAND OFF / DRESSING APPLIED:   30 minutes , gauze and tegaderm applied  SITE UPON ARRIVAL:    Level 0 SITE AFTER BAND REMOVAL:    Level 0  CIRCULATION SENSATION AND MOVEMENT:    Within Normal Limits : Yes  COMMENTS:

## 2023-08-15 ENCOUNTER — Telehealth (HOSPITAL_COMMUNITY): Payer: Self-pay | Admitting: Pharmacy Technician

## 2023-08-15 ENCOUNTER — Other Ambulatory Visit (HOSPITAL_COMMUNITY): Payer: Self-pay

## 2023-08-15 ENCOUNTER — Encounter (HOSPITAL_COMMUNITY): Payer: Self-pay | Admitting: Cardiovascular Disease

## 2023-08-15 ENCOUNTER — Other Ambulatory Visit: Payer: Self-pay

## 2023-08-15 ENCOUNTER — Telehealth: Payer: Self-pay | Admitting: Student

## 2023-08-15 ENCOUNTER — Other Ambulatory Visit: Payer: Self-pay | Admitting: Student

## 2023-08-15 DIAGNOSIS — I5032 Chronic diastolic (congestive) heart failure: Secondary | ICD-10-CM | POA: Diagnosis not present

## 2023-08-15 DIAGNOSIS — I110 Hypertensive heart disease with heart failure: Secondary | ICD-10-CM | POA: Diagnosis not present

## 2023-08-15 DIAGNOSIS — E119 Type 2 diabetes mellitus without complications: Secondary | ICD-10-CM | POA: Diagnosis not present

## 2023-08-15 DIAGNOSIS — I25110 Atherosclerotic heart disease of native coronary artery with unstable angina pectoris: Secondary | ICD-10-CM | POA: Diagnosis not present

## 2023-08-15 LAB — CBC
HCT: 36 % — ABNORMAL LOW (ref 39.0–52.0)
Hemoglobin: 11.7 g/dL — ABNORMAL LOW (ref 13.0–17.0)
MCH: 26.5 pg (ref 26.0–34.0)
MCHC: 32.5 g/dL (ref 30.0–36.0)
MCV: 81.4 fL (ref 80.0–100.0)
Platelets: 246 10*3/uL (ref 150–400)
RBC: 4.42 MIL/uL (ref 4.22–5.81)
RDW: 15.4 % (ref 11.5–15.5)
WBC: 8.6 10*3/uL (ref 4.0–10.5)
nRBC: 0 % (ref 0.0–0.2)

## 2023-08-15 LAB — BASIC METABOLIC PANEL WITH GFR
Anion gap: 7 (ref 5–15)
BUN: 15 mg/dL (ref 8–23)
CO2: 25 mmol/L (ref 22–32)
Calcium: 8.8 mg/dL — ABNORMAL LOW (ref 8.9–10.3)
Chloride: 105 mmol/L (ref 98–111)
Creatinine, Ser: 1.07 mg/dL (ref 0.61–1.24)
GFR, Estimated: >60 mL/min (ref >60)
Glucose, Bld: 142 mg/dL — ABNORMAL HIGH (ref 70–99)
Potassium: 3.8 mmol/L (ref 3.5–5.1)
Sodium: 137 mmol/L (ref 135–145)

## 2023-08-15 MED ORDER — CARVEDILOL 12.5 MG PO TABS: 12.5000 mg | ORAL_TABLET | Freq: Two times a day (BID) | ORAL | Status: DC

## 2023-08-15 MED ORDER — METOPROLOL SUCCINATE ER 25 MG PO TB24
25.0000 mg | ORAL_TABLET | Freq: Every day | ORAL | Status: DC
  Administered 2023-08-15: 25 mg via ORAL
  Filled 2023-08-15: qty 1

## 2023-08-15 MED ORDER — COLCHICINE 0.6 MG PO TABS: 0.6000 mg | ORAL_TABLET | Freq: Every day | ORAL | Status: DC | PRN

## 2023-08-15 MED ORDER — METOPROLOL SUCCINATE ER 25 MG PO TB24
25.0000 mg | ORAL_TABLET | Freq: Every day | ORAL | 11 refills | Status: AC
  Filled 2023-08-15: qty 30, 30d supply, fill #0

## 2023-08-15 MED ORDER — PRASUGREL HCL 10 MG PO TABS
10.0000 mg | ORAL_TABLET | Freq: Every day | ORAL | 3 refills | Status: AC
Start: 2023-08-15 — End: ?
  Filled 2023-08-15: qty 90, 90d supply, fill #0

## 2023-08-15 MED ORDER — PRASUGREL HCL 10 MG PO TABS
10.0000 mg | ORAL_TABLET | Freq: Every day | ORAL | 3 refills | Status: DC
Start: 2023-08-15 — End: 2023-08-15
  Filled 2023-08-15: qty 90, 90d supply, fill #0

## 2023-08-15 MED ORDER — ALLOPURINOL 100 MG PO TABS: 100.0000 mg | ORAL_TABLET | Freq: Every day | ORAL | Status: AC | PRN

## 2023-08-15 NOTE — Plan of Care (Signed)

## 2023-08-15 NOTE — Telephone Encounter (Signed)
 Transition Care Management Follow-up Telephone Call Date of discharge and from where: 08/15/23, [facility] How have you been since you were released from the hospital? PATIENT REPORTS NO ABNORMAL SYMPTOMS SINCE DISCHARGE Any questions or concerns? No  Items Reviewed: Did the pt receive and understand the discharge instructions provided? Yes  Medications obtained and verified? Yes  Other?N/A  Any new allergies since your discharge? No  Dietary orders reviewed? N/A  Do you have support at home? Yes - SISTER CHECKS ON HIM   Home Care and Equipment/Supplies: Were home health services ordered? no   Functional Questionnaire: (I = Independent and D = Dependent) PATIENT HAS MOBILITY RESTRICTIONS IN CATH EXTREMITY BUT INDEPENDENT IN ALL OTHER AREAS.   ADL - I   Bathing/Dressing- I  Meal Prep- I  Eating- I  Maintaining continence- I  Transferring/Ambulation- I   Follow up appointments reviewed:  PCP Hospital f/u appt confirmed[facility] f/u appt confirmed? Yes  Scheduled to see ERNEST DICK on 08/21/23 @ 0825. Are transportation arrangements needed? No  If their condition worsens, is the pt aware to call PCP or go to the Emergency Dept.? Yes Was the patient provided with contact information for the PCP's office or ED? Yes Was to pt encouraged to call back with questions or concerns? Yes

## 2023-08-15 NOTE — Discharge Instructions (Signed)
Groin Site Care °Refer to this sheet in the next few weeks. These instructions provide you with information on caring for yourself after your procedure. Your caregiver may also give you more specific instructions. Your treatment has been planned according to current medical practices, but problems sometimes occur. Call your caregiver if you have any problems or questions after your procedure. °HOME CARE INSTRUCTIONS °· You may shower 24 hours after the procedure. Remove the bandage (dressing) and gently wash the site with plain soap and water. Gently pat the site dry.  °· Do not apply powder or lotion to the site.  °· Do not sit in a bathtub, swimming pool, or whirlpool for 5 to 7 days.  °· No bending, squatting, or lifting anything over 10 pounds (4.5 kg) as directed by your caregiver.  °· Inspect the site at least twice daily.  °· Do not drive home if you are discharged the same day of the procedure. Have someone else drive you.  °· You may drive 24 hours after the procedure unless otherwise instructed by your caregiver.  °What to expect: °· Any bruising will usually fade within 1 to 2 weeks.  °· Blood that collects in the tissue (hematoma) may be painful to the touch. It should usually decrease in size and tenderness within 1 to 2 weeks.  °SEEK IMMEDIATE MEDICAL CARE IF: °· You have unusual pain at the groin site or down the affected leg.  °· You have redness, warmth, swelling, or pain at the groin site.  °· You have drainage (other than a small amount of blood on the dressing).  °· You have chills.  °· You have a fever or persistent symptoms for more than 72 hours.  °· You have a fever and your symptoms suddenly get worse.  °· Your leg becomes pale, cool, tingly, or numb.  °You have heavy bleeding from the site. Hold pressure on the site. . ° ° °Radial Site Care °Refer to this sheet in the next few weeks. These instructions provide you with information on caring for yourself after your procedure. Your caregiver  may also give you more specific instructions. Your treatment has been planned according to current medical practices, but problems sometimes occur. Call your caregiver if you have any problems or questions after your procedure. °HOME CARE INSTRUCTIONS °· You may shower the day after the procedure. Remove the bandage (dressing) and gently wash the site with plain soap and water. Gently pat the site dry.  °· Do not apply powder or lotion to the site.  °· Do not submerge the affected site in water for 3 to 5 days.  °· Inspect the site at least twice daily.  °· Do not flex or bend the affected arm for 24 hours.  °· No lifting over 5 pounds (2.3 kg) for 5 days after your procedure.  °· Do not drive home if you are discharged the same day of the procedure. Have someone else drive you.  °· You may drive 24 hours after the procedure unless otherwise instructed by your caregiver.  °What to expect: °· Any bruising will usually fade within 1 to 2 weeks.  °· Blood that collects in the tissue (hematoma) may be painful to the touch. It should usually decrease in size and tenderness within 1 to 2 weeks.  °SEEK IMMEDIATE MEDICAL CARE IF: °· You have unusual pain at the radial site.  °· You have redness, warmth, swelling, or pain at the radial site.  °· You have drainage (other   than a small amount of blood on the dressing).  °· You have chills.  °· You have a fever or persistent symptoms for more than 72 hours.  °· You have a fever and your symptoms suddenly get worse.  °· Your arm becomes pale, cool, tingly, or numb.  °· You have heavy bleeding from the site. Hold pressure on the site.  °·  °

## 2023-08-15 NOTE — Discharge Summary (Signed)
 Discharge Summary   Patient ID: Alex [name] MRN: [id number]; DOB: 10/07/1955  Admit date: 08/14/2023 Discharge date: 08/15/2023  PCP:  Albin Huh, MD   [facility] Providers Cardiologist:  Antoinette Batman, MD     Discharge Diagnoses  Principal Problem:   Unstable angina [facility])   Diagnostic Studies/Procedures   Cardiac catheterization on 08/14/2023   Prox RCA to Mid RCA lesion is 70% stenosed.   Mid Cx to Dist Cx lesion is 60% stenosed.   Mid LAD lesion is 90% stenosed.   A drug-eluting stent was successfully placed using a STENT SYNERGY XD 4.0X20.   Post intervention, there is a 0% residual stenosis.   Severe proximal to mid LAD stenosis Successful PTCA/DES x 1 proximal to mid LAD. Flow lost down a small diagonal branch that was jailed by the stent Moderate disease in the mid AV groove Circumflex Dominant RCA with moderately severe mid RCA stenosis.  LVEDP 24 mmHg     Recommendations: Will watch on telemetry given need for groin access and his increased risk of bleeding (obesity). DAPT for at least six months with ASA and Effient. Continue Repatha  (statin intolerant). Question dosing of Coreg  (once daily). Can try BID dosing. Medical management of RCA stenosis for now. _____________   History of Present Illness   Alex [name] is a 68 y.o. male with a history of CAD, chronic diastolic heart failure, hypertension, hyperlipidemia on Repatha , type 2 diabetes, GERD, gout, obesity BMI 49, OSA, aortic aneurysm 4.5cm, and prior TIA in 2013.  Patient's CAD was initially diagnosed by a cardiac cath in 2002 that found 25% stenosis in the distal RCA.  Because of chest pain he had a echo and stress Myoview  in 2013.  The stress test showed no ischemia and echo showed an LVEF of 55 to 65%, no regional wall motion abnormalities, moderate pulmonary hypertension, grade 1 diastolic dysfunction, and moderate LVH.  An 2014 the patient had weight gain and lower extremity edema and an  echo at that time showed a normal LVEF and was diagnosed with diastolic heart failure. patient was intolerant to statins because of muscle weakness but is currently on Repatha .  Most recent LDL was 50 on 02/2023.  Patient's most recent echo was performed on 06/2021 and showed the EF 50 to 55%, inferior septal hypokinesis, moderate concentric LVH, grade 1 diastolic dysfunction, and ascending aortic aneurysm of 4.5 cm.  Was seen in the emergency department on 06/2023 for chest pain, and fatigue.  Troponins were negative and EKG showed no acute ischemic changes.  Had an outpatient follow-up a few days later with Dr. Abel Hoe and a cardiac CTA was ordered.  Cardiac CTA was performed on 07/24/2023 and showed 25 to 49% stenosis in proximal LAD, 70 to 99% stenosis in mid LAD, 25 to 49% stenosis in mid LCX, 50 to 99% stenosis in proximal RCA, and 50 to 69% stenosis in mid RCA.  The patient also had a coronary calcium  score in the 96th percentile.  Because of these findings and the patient symptoms he was scheduled for a cardiac catheterization on 08/14/23.   Hospital Course     Feeling better, been able to get up and walk to bathroom, chest pain and shortness of breath have improved  CAD with unstable angina Hyperlipidemia  Patient presented on 08/14/2023 for an outpatient cardiac catheterization with Dr. Abel Hoe. Received a cardiac catheterization on 08/14/2023 that showed 70% stenosis in the mid to proximal RCA, 60% stenosis in the mid to  distal circumflex, and 90% stenosis in the mid LAD.  A drug-eluting stent was placed in the LAD.  The patient required groin access for the cardiac catheterization.  Was started on aspirin  and Effient for 6 months.  The RCA was managed medically. -EKG today shows normal sinus rhythm with a first-degree AV block. - Continue aspirin  81 mg - Continue Effient 10 mg daily - Stop Coreg  25 mg daily. Patient prefers to take his medications once daily so will start metoprolol  succinate 25  daily.  May need titration at follow-up. - He is intolerant to statins because of muscle weakness but is on Repatha .  On 02/2023 LDL was 50. - Has outpatient cardiology follow-up on 08/21/2023. - Needs outpatient echo scheduled  Hypertension Blood pressures have been elevated with most recent BP at 144/99.  It does not appear like he has received his amlodipine  and irbesartan  this is likely contributing to the elevated blood pressure - Restart home amlodipine  10 mg daily with supper - Manage beta-blockers as above - Restart home irbesartan  150 mg daily  Chronic HFpEF Was initially diagnosed with heart failure back in 2014. I am not seeing any recent exacerbations.  Discussed with pharmacy and SGLT2 would cost $45 a month. Discussed with patient and he feels like this would be a significant financial burden.   Sleep apnea - Wears CPAP  GERD -continue home omeprazole .    Thoracic aortic aneurysm  - 4.5 x 4.3  cm ascending aorta by chest CTA May 2025. Repeat imaging in one year.  Normocytic anemia - On 08/15/2023 hemoglobin is 11.7.  This appears to be about baseline.  Gout - Continue home allopurinol  and colchicine   Type 2 diabetes Morbid obesity Was previously on metformin  stopped taking in 2021 because it made him feel poorly.  Suspect will benefit from SGLT2 or GLP-1. Check blood sugar daily.  Most recent A1c was in 2022 and was 6.2.  Needs repeat A1c outpatient. Needs outpatient follow-up with PCP work on diabetic management.  Has annual wellness visit scheduled on 10/10/2023.    Did the patient have an acute coronary syndrome (MI, NSTEMI, STEMI, etc) this admission?:  No.       The patient will be scheduled for a TOC follow up appointment in 6 days.  A message has been sent to the [facility] and Scheduling Pool at the office where the patient should be seen for follow up.  _____________  Discharge Vitals Blood pressure (!) 144/99, pulse 78, temperature 98.1 F (36.7 C),  temperature source Oral, resp. rate 17, height 5\' 8"  (1.727 m), weight (!) 146.5 kg, SpO2 98%.  Filed Weights   08/14/23 0981  Weight: (!) 146.5 kg   Telemetry/ECG  Normal sinus rhythm with resting rates in the 60s to 70s- Personally Reviewed  Physical Exam  GEN: No acute distress.   Neck: No JVD Cardiac: RRR, no murmurs, rubs, or gallops.  Respiratory: Clear to auscultation bilaterally. GI: Soft, nontender, non-distended  MS: No edema.  Lower extremity pulses 2+ bilaterally.  Right radial site has minimal swelling.  Right femoral site was initially bleeding but bleeding appears to have slowed.  Femoral site has minimal swelling and pain.  Pulses present at both sides.  Labs & Radiologic Studies  CBC Recent Labs    08/15/23 0408  WBC 8.6  HGB 11.7*  HCT 36.0*  MCV 81.4  PLT 246   Basic Metabolic Panel Recent Labs    19/14/78 0408  NA 137  K 3.8  CL 105  CO2 25  GLUCOSE 142*  BUN 15  CREATININE 1.07  CALCIUM  8.8*   Liver Function Tests No results for input(s): "AST", "ALT", "ALKPHOS", "BILITOT", "PROT", "ALBUMIN" in the last 72 hours. No results for input(s): "LIPASE", "AMYLASE" in the last 72 hours. High Sensitivity Troponin:   No results for input(s): "TROPONINIHS" in the last 720 hours.  No results for input(s): "TRNPT" in the last 720 hours.  BNP Invalid input(s): "POCBNP" No results for input(s): "PROBNP" in the last 72 hours.  No results for input(s): "BNP" in the last 72 hours.  D-Dimer No results for input(s): "DDIMER" in the last 72 hours. Hemoglobin A1C No results for input(s): "HGBA1C" in the last 72 hours. Fasting Lipid Panel No results for input(s): "CHOL", "HDL", "LDLCALC", "TRIG", "CHOLHDL", "LDLDIRECT" in the last 72 hours. No results found for: "LIPOA"  Thyroid  Function Tests No results for input(s): "TSH", "T4TOTAL", "T3FREE", "THYROIDAB" in the last 72 hours.  Invalid input(s): "FREET3" _____________  CARDIAC CATHETERIZATION Result  Date: 08/14/2023   Prox RCA to Mid RCA lesion is 70% stenosed.   Mid Cx to Dist Cx lesion is 60% stenosed.   Mid LAD lesion is 90% stenosed.   A drug-eluting stent was successfully placed using a STENT SYNERGY XD 4.0X20.   Post intervention, there is a 0% residual stenosis. Severe proximal to mid LAD stenosis Successful PTCA/DES x 1 proximal to mid LAD. Flow lost down a small diagonal branch that was jailed by the stent Moderate disease in the mid AV groove Circumflex Dominant RCA with moderately severe mid RCA stenosis. LVEDP 24 mmHg Recommendations: Will watch on telemetry given need for groin access and his increased risk of bleeding (obesity). DAPT for at least six months with ASA and Effient. Continue Repatha  (statin intolerant). Question dosing of Coreg  (once daily). Can try BID dosing. Medical management of RCA stenosis for now.   CT CORONARY FRACTIONAL FLOW RESERVE FLUID ANALYSIS Result Date: 07/24/2023 EXAM: FFRCT ANALYSIS FINDINGS: FFRct analysis was performed on the original cardiac CT angiogram dataset. Diagrammatic representation of the FFRct analysis is provided in a separate PDF document in PACS. This dictation was created using the PDF document and an interactive 3D model of the results. 3D model is not available in the EMR/PACS. Normal FFR range is >0.80. 1. Left Main: No significant stenosis. FFR = 1.00 2. LAD: Possible significant stenosis: Proximal 0.99, Mid, 0.70 Distal 0.64. 3. LCX: No significant stenosis: Proximal 0.96,, Mid 0.96, Distal 0.90. 4. RCA: Possible significant stenosis: Proximal 0.98, Mid 0.61, Distal 0.61. IMPRESSION: 1. Coronary CTA FFR analysis demonstrates possible flow limiting lesions in the proximal LAD (0.7) and mid RCA (0.61). 2.  Recommend Cardiac Catheterization. Gaylyn Keas MD Electronically Signed   By: Gaylyn Keas M.D.   On: 07/24/2023 11:53   CT CORONARY MORPH W/CTA COR W/SCORE W/CA W/CM &/OR WO/CM Result Date: 07/24/2023 CLINICAL DATA:  Chest pain EXAM:  Cardiac/Coronary CTA TECHNIQUE: A non-contrast, gated CT scan was obtained with axial slices of 3 mm through the heart for calcium  scoring. Calcium  scoring was performed using the Agatston method. A 120 kV prospective, gated, contrast cardiac scan was obtained. Gantry rotation speed was 250 msecs and collimation was 0.6 mm. Two sublingual nitroglycerin  tablets (0.8 mg) were given. The 3D data set was reconstructed in 5% intervals of the 35-75% of the R-R cycle. Diastolic phases were analyzed on a dedicated workstation using MPR, MIP, and VRT modes. The patient received 95 cc of contrast. FINDINGS: Image quality:  Fair Noise artifact is: Moderate Coronary Arteries:  Normal coronary origin.  Right dominance. Left main: The left main is a large caliber vessel with a normal take off from the left coronary cusp that bifurcates to form a left anterior descending artery and a left circumflex artery. trifurcates into a LAD, LCX, and ramus intermedius. There is no plaque or stenosis. Left anterior descending artery: The LAD gives off 2 patent diagonal branches. There is mild calcified plaque in the proximal LAD with associated stenosis of 25-49%. There is severe mixed plaque in the mid LAD with associated stenosis of 70-99%. Left circumflex artery: The LCX is non-dominant and gives off 2 patent obtuse marginal branches. There is mild calcified plaque in the proximal and mid LCx with associated stenosis of 25-49%. Right coronary artery: The RCA is dominant with normal take off from the right coronary cusp. There is mild calcified plaque in the proximal RCA with associated stenosis of 25-49% followed by moderate to severe soft plaque in the proximal to mid RCA with associated stenosis of at least 50-69% and possibly 70-99%. This may be over estimated due to noise artifact which reduces ability to accurately quantify stenosis. There is moderate calcified plaque in the mid RCA with associated stenosis of 50-69%. the RCA  terminates as a PDA and right posterolateral branch without evidence of plaque or stenosis. Right Atrium: Right atrial size is within normal limits. Right Ventricle: The right ventricular cavity is within normal limits. Left Atrium: Left atrial size is normal in size with no left atrial appendage filling defect. Left Ventricle: The ventricular cavity size is within normal limits. Pulmonary arteries: Normal in size. Pulmonary veins: Normal pulmonary venous drainage. Pericardium: Normal thickness without significant effusion or calcium  present. Cardiac valves: The aortic valve is trileaflet without significant calcification. The mitral valve is normal without significant calcification. Aorta: Mild to moderately dilated ascending aorta measuring 45mm x 43mm (double oblique) at the bifurcation of the main pulmonary artery with scattered calcifications. Extra-cardiac findings: See attached radiology report for non-cardiac structures. IMPRESSION: 1. Coronary calcium  score of 953. This was 96th percentile for age-, sex, and race-matched controls. 2. Total plaque volume 1456 mm3 which is 91st percentile for age- and sex-matched controls (calcified plaque 334mm3; non-calcified plaque 1058mm3). TPV is extensive. 3. Normal coronary origin with right dominance. 4. Severe atherosclerosis: 70-99% mid LAD and 50-69% prox/mid RCA but possibly >70%. 5. Mild to moderately dilated ascending aorta measuring 45mm x 43mm (double oblique) at the bifurcation of the main pulmonary artery with scattered calcifications. 6. Recommend cardiac catheterization. 7. This study has been submitted for FFR analysis of the LAD and RCA. RECOMMENDATIONS: 1. CAD-RADS 0: No evidence of CAD (0%). Consider non-atherosclerotic causes of chest pain. 2. CAD-RADS 1: Minimal non-obstructive CAD (0-24%). Consider non-atherosclerotic causes of chest pain. Consider preventive therapy and risk factor modification. 3. CAD-RADS 2: Mild non-obstructive CAD (25-49%).  Consider non-atherosclerotic causes of chest pain. Consider preventive therapy and risk factor modification. 4. CAD-RADS 3: Moderate stenosis. Consider symptom-guided anti-ischemic pharmacotherapy as well as risk factor modification per guideline directed care. Additional analysis with CT FFR will be submitted. 5. CAD-RADS 4: Severe stenosis. (70-99% or > 50% left main). Cardiac catheterization or CT FFR is recommended. Consider symptom-guided anti-ischemic pharmacotherapy as well as risk factor modification per guideline directed care. Invasive coronary angiography recommended with revascularization per published guideline statements. 6. CAD-RADS 5: Total coronary occlusion (100%). Consider cardiac catheterization or viability assessment. Consider symptom-guided anti-ischemic pharmacotherapy as well as risk factor modification  per guideline directed care. 7. CAD-RADS N: Non-diagnostic study. Obstructive CAD can't be excluded. Alternative evaluation is recommended. Gaylyn Keas, MD Electronically Signed   By: Gaylyn Keas M.D.   On: 07/24/2023 11:46    Disposition Pt is being discharged home today in good condition.  Follow-up Plans & Appointments    Discharge Medications Allergies as of 08/15/2023       Reactions   Atorvastatin  Other (See Comments)   Subjective myalgias. Gave 2 trials, developed myalgias which resolved after cessation of medication.  Muscle cramps were 9/10 discomfort   Codeine Nausea Only   Crestor  [rosuvastatin ]    myalgias   Shellfish Allergy Other (See Comments)   Activates asthma        Medication List     STOP taking these medications    carvedilol  25 MG tablet Commonly known as: COREG    isosorbide  mononitrate 30 MG 24 hr tablet Commonly known as: IMDUR        TAKE these medications    acetaminophen  500 MG tablet Commonly known as: TYLENOL  Take 500 mg by mouth every 6 (six) hours as needed for moderate pain.   allopurinol  100 MG tablet Commonly  known as: ZYLOPRIM  Take 1 tablet (100 mg total) by mouth daily as needed (gout).   amLODipine  10 MG tablet Commonly known as: NORVASC  TAKE 1 TABLET BY MOUTH ONCE DAILY WITH SUPPER   aspirin  81 MG chewable tablet Chew 1 tablet (81 mg total) by mouth daily.   colchicine  0.6 MG tablet Take 1 tablet (0.6 mg total) by mouth daily as needed (gout).   glucose blood test strip 1 each by Other route daily. Use as instructed   irbesartan  150 MG tablet Commonly known as: AVAPRO  Take 1 tablet (150 mg total) by mouth daily. Please make overdue appt with Dr. Abel Hoe before anymore refills  Thank you 3rd and Final attempt   metoprolol  succinate 25 MG 24 hr tablet Commonly known as: TOPROL -XL Take 1 tablet (25 mg total) by mouth daily.   naproxen  500 MG tablet Commonly known as: NAPROSYN  TAKE 1 TABLET BY MOUTH ONCE DAILY AS NEEDED FOR MODERATE PAIN   nitroGLYCERIN  0.4 MG SL tablet Commonly known as: NITROSTAT  Place 1 tablet (0.4 mg total) under the tongue every 5 (five) minutes as needed for chest pain.   omeprazole  40 MG capsule Commonly known as: PRILOSEC TAKE 1 CAPSULE BY MOUTH ONCE DAILY WITH SUPPER   prasugrel 10 MG Tabs tablet Commonly known as: EFFIENT Take 1 tablet (10 mg total) by mouth daily.   Repatha  SureClick 140 MG/ML Soaj Generic drug: Evolocumab  INJECT 140 MG INTO THE SKIN EVERY 14 DAYS   triamcinolone  ointment 0.5 % Commonly known as: KENALOG  APPLY OINTMENT TOPICALLY TO AFFECTED AREA AS NEEDED         Outstanding Labs/Studies Will schedule outpatient echo  Duration of Discharge Encounter: APP Time: 20 minutes   Signed, Melita Springer, PA-C 08/15/2023, 9:37 AM

## 2023-08-15 NOTE — Progress Notes (Signed)
 Needs appointment scheduled. Dr Abel Hoe to read.

## 2023-08-15 NOTE — Telephone Encounter (Addendum)
 Patient Product/process development scientist completed.    The patient is insured through [employer]. Patient has Medicare and is not eligible for a copay card, but may be able to apply for patient assistance or Medicare RX Payment Plan (Patient Must reach out to their plan, if eligible for payment plan), if available.    Ran test claim for prasugrel (Effient) 10 mg and the current 30 day co-pay is $23.24.  Ran test claim for Farxiga 10 mg and the current 30 day co-pay is $45.00.  Ran test claim for Jardiance 10 mg and the current 30 day co-pay is $45.00.  This test claim was processed through [facility]- copay amounts may vary at other pharmacies due to pharmacy/plan contracts, or as the patient moves through the different stages of their insurance plan.     Morgan Arab, CPHT Pharmacy Technician III Certified Patient Advocate [facility] Patient Advocate Team Direct Number: [phone]  Fax: [phone]

## 2023-08-15 NOTE — Plan of Care (Signed)
   Problem: Education: Goal: Understanding of CV disease, CV risk reduction, and recovery process will improve Outcome: Progressing   Problem: Cardiovascular: Goal: Ability to achieve and maintain adequate cardiovascular perfusion will improve Outcome: Progressing Goal: Vascular access site(s) Level 0-1 will be maintained Outcome: Progressing

## 2023-08-15 NOTE — Telephone Encounter (Signed)
   Transition of Care Follow-up Phone Call Request    Patient Name: Alex [name] Date of Birth: 12-27-1955 Date of Encounter: 08/15/2023  Primary Care Provider:  Albin Huh, MD Primary Cardiologist:  Antoinette Batman, MD  Arman Lamprey has been scheduled for a transition of care follow up appointment with a [facility] provider:  Gerald Kitty., NP on 08/21/2023 at 8:25 AM  Please reach out to Arman Lamprey within 48 hours of discharge to confirm appointment and review transition of care protocol questionnaire. Anticipated discharge date: 08/15/23  Melita Springer, PA-C  08/15/2023, 10:43 AM

## 2023-08-19 NOTE — Telephone Encounter (Signed)
 CARDIAC REHAB PHASE I     Post stent education including site care, restrictions, risk factors, exercise guidelines, NTG use, antiplatelet therapy importance, heart healthy diabetic diet and CRP2 reviewed. All questions and concerns addressed. Will refer to [facility] for CRP2. Will send written materials to email on file.    Ronny Colas, RN BSN 08/19/2023 3:00 PM

## 2023-08-20 ENCOUNTER — Telehealth: Payer: Self-pay

## 2023-08-20 ENCOUNTER — Ambulatory Visit: Payer: Medicare (Managed Care)

## 2023-08-20 NOTE — Progress Notes (Signed)
 " Cardiology Office Note    Patient Name: Alex [name] Date of Encounter: 08/21/2023  Primary Care Provider:  Elicia Hamlet, MD Primary Cardiologist:  Lonni Cash, MD Primary Electrophysiologist: None   Past Medical History    Past Medical History:  Diagnosis Date   Asthma    CAD (coronary artery disease)    Cardiac cath 2002 with 25% distal RCA stenosis.    DDD (degenerative disc disease), lumbar    Diabetes mellitus (HCC)    Diverticulitis    Esophageal stricture    Gastritis 2010   GERD (gastroesophageal reflux disease)    GI bleed    Gout    HTN (hypertension)    Hyperlipidemia    Insomnia    MI (myocardial infarction) (HCC) 2009   no stent placement   OA (osteoarthritis)    Obesity    OSA on CPAP    Seasonal allergies    TIA (transient ischemic attack) 2013    History of Present Illness  Alex [name] is a 68 y.o. male with a PMH of CAD s/p LHC with 70% stenosed mid to proximal RCA, 90% stenosis mid LAD treated with PCI/DES x 1, DM type II, HTN, HLD, statin intolerance, HFpEF, TIA, obesity, OSA, GERD, gout who presents today for post PCI follow-up.  [name] has been followed by Dr. Cash since 2013 for management of coronary artery disease.  He was previously followed by Dr. Pietro and underwent LHC in 2002 that showed moderate nonobstructive disease.  Presented to the ED in 2009 and underwent ACS workup that was negative. Echo in 2013 showed normal LV size and function with LVEF of 55-65% and moderate LVH.  He completed a repeat echo in 2023 that showed dilated ascending aorta with chest CT 10/2021 showing 4.1 cm aneurysm.  He was last seen in office on 07/29/2023 with complaint of severe fatigue and central chest pain at rest.  He completed a coronary CTA that showed severe stenosis in mid LAD and proximal to mid RCA.  LHC was recommended and patient completed study on 08/14/2023 showing 70% proximal to mid RCA lesion, 60% mid circumflex to distal  circumflex lesion, 90% mid LAD lesion that was treated with DES/PCI x 1.  He was started on DAPT with ASA and Effient .  He is on Repatha  due to statin intolerance and was discharged on metoprolol  25 mg daily.  He was noted to have elevated blood pressures with reinitiation of amlodipine  10 mg and irbesartan  150 mg.  [name] presents today for post PCI follow-up. He underwent a heart catheterization on August 14, 2023, revealing a 90% blockage in the mid left anterior descending artery, for which a stent was placed. There was also a 70% blockage in the circumflex artery. He was discharged on Effient , aspirin , metoprolol , amlodipine , and irbesartan . Since discharge, he feels fine, with occasional sensations of feeling hot and cold, which he attributes to the blood thinner. He reports no significant chest pain since the procedure but experiences some shortness of breath, particularly during activities such as showering. He has not yet started cardiac rehabilitation but is interested in doing so. He has recently resumed driving. His past medical history includes a heart catheterization in 2002 showing nonobstructive disease, a negative workup for chest pain in 2009, and an echocardiogram in 2013 that was normal. A 2023 echocardiogram showed dilation of the aorta, and he has been undergoing annual CT scans since then. His last CT scan showed the aorta measured 4.1 cm.  He is currently on Repatha  due to intolerance to regular statins and monitors his blood pressure at home, which has been well-controlled with recent readings around 120/80 mmHg. He has a history of diabetes, which is currently diet-controlled. He has lost 2 pounds since his last visit, weighing 323 pounds currently. He reports a slight soreness and a small knot at the catheterization site in the groin, which is not pulsating or significantly painful. No swelling in his feet or ankles. Patient denies chest pain, palpitations, dyspnea, PND, orthopnea,  nausea, vomiting, dizziness, syncope, edema, weight gain, or early satiety.  Discussed the use of AI scribe software for clinical note transcription with the patient, who gave verbal consent to proceed.  History of Present Illness   Review of Systems  Please see the history of present illness.    All other systems reviewed and are otherwise negative except as noted above.  Physical Exam     Wt Readings from Last 3 Encounters:  08/21/23 (!) 323 lb (146.5 kg)  08/14/23 (!) 323 lb (146.5 kg)  07/29/23 (!) 325 lb (147.4 kg)   VS: Vitals:   08/21/23 0801  BP: 122/88  Pulse: 74  SpO2: 97%  ,Body mass index is 49.11 kg/m. GEN: Well nourished, well developed in no acute distress Neck: No JVD; No carotid bruits Pulmonary: Clear to auscultation without rales, wheezing or rhonchi  Cardiovascular: Normal rate. Regular rhythm. Normal S1. Normal S2.  Right femoral site clean dry and intact with no evidence of ecchymosis or hematomaMurmurs: There is no murmur.  ABDOMEN: Soft, non-tender, non-distended EXTREMITIES:  No edema; No deformity   EKG/LABS/ Recent Cardiac Studies   ECG personally reviewed by me today -none completed today  Risk Assessment/Calculations:          Lab Results  Component Value Date   WBC 8.6 08/15/2023   HGB 11.7 (L) 08/15/2023   HCT 36.0 (L) 08/15/2023   MCV 81.4 08/15/2023   PLT 246 08/15/2023   Lab Results  Component Value Date   CREATININE 1.07 08/15/2023   BUN 15 08/15/2023   NA 137 08/15/2023   K 3.8 08/15/2023   CL 105 08/15/2023   CO2 25 08/15/2023   Lab Results  Component Value Date   CHOL 115 02/18/2023   HDL 30 (L) 02/18/2023   LDLCALC 50 02/18/2023   LDLDIRECT 130 (H) 02/22/2012   TRIG 213 (H) 02/18/2023   CHOLHDL 3.8 02/18/2023    Lab Results  Component Value Date   HGBA1C 6.4 11/13/2022   Assessment & Plan   Assessment & Plan   1.  Coronary artery disease: -s/p LHC with  70% stenosed mid to proximal RCA, 90% stenosis  mid LAD treated with PCI/DES x 1 -Today shortness of breath noted, no significant chest pain. BP controlled. Emphasized medication and lifestyle changes to prevent further blockages. - Initiate cardiac rehabilitation program. - Continue Effient  10 mg, Repatha  140 mg q. 14 days, Toprol -XL 25 mg and and aspirin  81 mg. - Educate on risk factor modification including diet and exercise.  2.  Chronic HFpEF: - Patient reports shortness of breath and is euvolemic on examination -Echocardiogram scheduled. Discussed beta blockers' benefits in improving circulation and reducing heart stiffness. - Continue metoprolol  25 mg daily. -Low sodium diet, fluid restriction <2L, and daily weights encouraged. Educated to contact our office for weight gain of 2 lbs overnight or 5 lbs in one week.    3.  Essential hypertension: - Patient's blood pressure today is stable at  122/88 - Continue Toprol -XL 25 mg, irbesartan  150 and Norvasc  10 mg  4.  Hyperlipidemia: - Patient's last LDL cholesterol was 50 - Marjan 40 mg q. 14 days  5.  DM type II: -Diabetes management focused on lifestyle. Discussed SGLT2 inhibitors for cardiovascular protection, declined due to cost. Explained benefits in preventing diabetes progression and heart remodeling. - Consider SGLT2 inhibitors in the future if cost becomes manageable.     Cardiac Rehabilitation Eligibility Assessment  The patient is ready to start cardiac rehabilitation from a cardiac standpoint.     Disposition: Follow-up with Lonni Cash, MD or APP in 3 months    Signed, Wyn Raddle, Jackee Shove, NP 08/21/2023, 10:15 AM [facility] "

## 2023-08-20 NOTE — Telephone Encounter (Signed)
 Patient calls nurse line reporting gout flare.   He reports he takes Allopurinol  and Colchicine  as needed. He reports his last dose was on 6/5. He reports he feels his flares are coming every 7-10 days. He reports mostly in his left foot.   He is requesting a different medication to help prevent/alleviate symptoms.   Patient scheduled for this afternoon for evaluation.

## 2023-08-21 ENCOUNTER — Encounter: Payer: Self-pay | Admitting: Nurse Practitioner

## 2023-08-21 ENCOUNTER — Ambulatory Visit: Payer: Medicare (Managed Care) | Attending: Nurse Practitioner | Admitting: Nurse Practitioner

## 2023-08-21 VITALS — BP 122/88 | HR 74 | Ht 68.0 in | Wt 323.0 lb

## 2023-08-21 DIAGNOSIS — E7800 Pure hypercholesterolemia, unspecified: Secondary | ICD-10-CM

## 2023-08-21 DIAGNOSIS — I5032 Chronic diastolic (congestive) heart failure: Secondary | ICD-10-CM

## 2023-08-21 DIAGNOSIS — I10 Essential (primary) hypertension: Secondary | ICD-10-CM

## 2023-08-21 DIAGNOSIS — I25110 Atherosclerotic heart disease of native coronary artery with unstable angina pectoris: Secondary | ICD-10-CM | POA: Diagnosis not present

## 2023-08-21 DIAGNOSIS — G4733 Obstructive sleep apnea (adult) (pediatric): Secondary | ICD-10-CM

## 2023-08-21 DIAGNOSIS — E119 Type 2 diabetes mellitus without complications: Secondary | ICD-10-CM

## 2023-08-21 NOTE — Patient Instructions (Signed)
 Medication Instructions:  Your physician recommends that you continue on your current medications as directed. Please refer to the Current Medication list given to you today. *If you need a refill on your cardiac medications before your next appointment, please call your pharmacy*  Lab Work: None ordered If you have labs (blood work) drawn today and your tests are completely normal, you will receive your results only by: MyChart Message (if you have MyChart) OR A paper copy in the mail If you have any lab test that is abnormal or we need to change your treatment, we will call you to review the results.  Testing/Procedures: None ordered  Follow-Up: At [facility], you and your health needs are our priority.  As part of our continuing mission to provide you with exceptional heart care, our providers are all part of one team.  This team includes your primary Cardiologist (physician) and Advanced Practice Providers or APPs (Physician Assistants and Nurse Practitioners) who all work together to provide you with the care you need, when you need it.  Your next appointment:   3 month(s)  Provider:   Charles Connor, NP  We recommend signing up for the patient portal called MyChart.  Sign up information is provided on this After Visit Summary.  MyChart is used to connect with patients for Virtual Visits (Telemedicine).  Patients are able to view lab/test results, encounter notes, upcoming appointments, etc.  Non-urgent messages can be sent to your provider as well.   To learn more about what you can do with MyChart, go to [email or web address].   Other Instructions

## 2023-08-22 ENCOUNTER — Telehealth (HOSPITAL_COMMUNITY): Payer: Self-pay

## 2023-08-22 NOTE — Telephone Encounter (Signed)
 Called patient to see if he is interested in the Cardiac Rehab Program. Patient expressed interest. Explained scheduling process and went over insurance, patient verbalized understanding. Will contact patient for scheduling once f/u has been completed.

## 2023-08-22 NOTE — Telephone Encounter (Signed)
 Pt insurance is active and benefits verified through [facility]. Co-pay $10.00, DED $0.00/$0.00 met, out of pocket $4,000.00/$1,231.32 met, co-insurance 0%. NO pre-authorization required. Aron Lard S./[employer] , 08/22/23 @ 4:14PM, ZOX#[id number]   How many CR sessions are covered? TCR only, no visit limit Is this a lifetime maximum or an annual maximum? Annual Has the member used any of these services to date? No Is there a time limit (weeks/months) on start of program and/or program completion? No     Will contact patient to see if he is interested in the Cardiac Rehab Program. If interested, patient will need to complete follow up appt. Once completed, patient will be contacted for scheduling upon review by the RN Navigator.

## 2023-08-30 ENCOUNTER — Other Ambulatory Visit: Payer: Self-pay | Admitting: Family Medicine

## 2023-09-04 ENCOUNTER — Ambulatory Visit (HOSPITAL_COMMUNITY): Payer: Medicare (Managed Care)

## 2023-09-04 ENCOUNTER — Telehealth (HOSPITAL_COMMUNITY): Payer: Self-pay

## 2023-09-04 NOTE — Telephone Encounter (Signed)
 Called patient to confirm Cardiac Rehab appointment tomorrow. RN completed Nursing Assessment with patient. Directions and instructions provided for the appointment. Pt understands without assistance.

## 2023-09-05 ENCOUNTER — Encounter (HOSPITAL_COMMUNITY)
Admission: RE | Admit: 2023-09-05 | Discharge: 2023-09-05 | Disposition: A | Discharge status: Home / Self Care | Payer: Medicare (Managed Care) | Source: Ambulatory Visit | Attending: Cardiovascular Disease | Admitting: Cardiovascular Disease

## 2023-09-05 VITALS — BP 112/86 | HR 63 | Ht 68.0 in | Wt 326.5 lb

## 2023-09-05 DIAGNOSIS — Z955 Presence of coronary angioplasty implant and graft: Secondary | ICD-10-CM | POA: Insufficient documentation

## 2023-09-05 NOTE — Progress Notes (Signed)
 Cardiac Individual Treatment Plan  Patient Details  Name: Alex [name] MRN: [id number] Date of Birth: Apr 17, 1955 Referring Provider:   Flowsheet Row CARDIAC REHAB PHASE II ORIENTATION from 09/05/2023 in [facility]  Referring Provider Lonni Cash, MD    Initial Encounter Date:  Flowsheet Row CARDIAC REHAB PHASE II ORIENTATION from 09/05/2023 in [facility]  Date 09/05/23    Visit Diagnosis: 08/14/23 DES LAD  Patient's Home Medications on Admission:  Current Outpatient Medications:    acetaminophen  (TYLENOL ) 500 MG tablet, Take 500 mg by mouth every 6 (six) hours as needed for moderate pain., Disp: , Rfl:    allopurinol  (ZYLOPRIM ) 100 MG tablet, Take 1 tablet (100 mg total) by mouth daily as needed (gout)., Disp: , Rfl:    amLODipine  (NORVASC ) 10 MG tablet, TAKE 1 TABLET BY MOUTH ONCE DAILY WITH SUPPER, Disp: 90 tablet, Rfl: 0   aspirin  81 MG chewable tablet, Chew 1 tablet (81 mg total) by mouth daily., Disp: 30 tablet, Rfl: 2   colchicine  0.6 MG tablet, Take 1 tablet (0.6 mg total) by mouth daily as needed (gout)., Disp: , Rfl:    Evolocumab  (REPATHA  SURECLICK) 140 MG/ML SOAJ, INJECT 140 MG INTO THE SKIN EVERY 14 DAYS, Disp: 2 mL, Rfl: 11   irbesartan  (AVAPRO ) 150 MG tablet, Take 1 tablet (150 mg total) by mouth daily. Please make overdue appt with Dr. Cash before anymore refills  Thank you 3rd and Final attempt, Disp: 15 tablet, Rfl: 0   metoprolol  succinate (TOPROL -XL) 25 MG 24 hr tablet, Take 1 tablet (25 mg total) by mouth daily., Disp: 30 tablet, Rfl: 11   naproxen  (NAPROSYN ) 500 MG tablet, TAKE 1 TABLET BY MOUTH ONCE DAILY AS NEEDED FOR MODERATE PAIN, Disp: 30 tablet, Rfl: 0   nitroGLYCERIN  (NITROSTAT ) 0.4 MG SL tablet, Place 1 tablet (0.4 mg total) under the tongue every 5 (five) minutes as needed for chest pain., Disp: 25 tablet, Rfl: 3   omeprazole   (PRILOSEC) 40 MG capsule, TAKE 1 CAPSULE BY MOUTH ONCE DAILY WITH SUPPER, Disp: 90 capsule, Rfl: 0   prasugrel  (EFFIENT ) 10 MG TABS tablet, Take 1 tablet (10 mg total) by mouth daily., Disp: 90 tablet, Rfl: 3   triamcinolone  ointment (KENALOG ) 0.5 %, APPLY OINTMENT TOPICALLY TO AFFECTED AREA AS NEEDED, Disp: 15 g, Rfl: 2   glucose blood test strip, 1 each by Other route daily. Use as instructed (Patient not taking: Reported on 09/05/2023), Disp: 100 each, Rfl: 12  Past Medical History: Past Medical History:  Diagnosis Date   Asthma    CAD (coronary artery disease)    Cardiac cath 2002 with 25% distal RCA stenosis.    DDD (degenerative disc disease), lumbar    Diabetes mellitus (HCC)    Diverticulitis    Esophageal stricture    Gastritis 2010   GERD (gastroesophageal reflux disease)    GI bleed    Gout    HTN (hypertension)    Hyperlipidemia    Insomnia    MI (myocardial infarction) (HCC) 2009   no stent placement   OA (osteoarthritis)    Obesity    OSA on CPAP    Seasonal allergies    TIA (transient ischemic attack) 2013    Tobacco Use: Social History   Tobacco Use  Smoking Status Former   Current packs/day: 0.00   Average packs/day: 1.5 packs/day for 10.0 years (15.0 ttl pk-yrs)  Types: Cigarettes   Start date: 11/05/1981   Quit date: 11/06/1991   Years since quitting: 31.8  Smokeless Tobacco Never  Tobacco Comments   quit 1993    Labs: Review Flowsheet  More data exists      Latest Ref Rng & Units 01/24/2021 06/11/2022 06/16/2022 11/13/2022 02/18/2023  Labs for ITP Cardiac and Pulmonary Rehab  Cholestrol 100 - 199 mg/dL 766  750  - - 884   LDL (calc) 0 - 99 mg/dL 840  844  - - 50   HDL-C >39 mg/dL 30  30  - - 30   Trlycerides 0 - 149 mg/dL 762  660  - - 786   Hemoglobin A1c 0.0 - 7.0 % 6.2  6.2  - 6.4  -  TCO2 22 - 32 mmol/L - - 23  - -    Capillary Blood Glucose: Lab Results  Component Value Date   GLUCAP 115 (H) 08/14/2023   GLUCAP 99 09/05/2017   GLUCAP  99 11/27/2012   GLUCAP 88 11/27/2012   GLUCAP 92 09/09/2012     Exercise Target Goals: Exercise Program Goal: Individual exercise prescription set using results from initial 6 min walk test and THRR while considering  patient's activity barriers and safety.   Exercise Prescription Goal: Initial exercise prescription builds to 30-45 minutes a day of aerobic activity, 2-3 days per week.  Home exercise guidelines will be given to patient during program as part of exercise prescription that the participant will acknowledge.  Activity Barriers & Risk Stratification:  Activity Barriers & Cardiac Risk Stratification - 09/05/23 1358       Activity Barriers & Cardiac Risk Stratification   Activity Barriers Arthritis;Joint Problems    Cardiac Risk Stratification High   <5 METs on         6 Minute Walk:  6 Minute Walk     Row Name 09/05/23 1528         6 Minute Walk   Phase Initial     Distance 1140 feet     Walk Time 6 minutes     # of Rest Breaks 0     MPH 2.16     METS 1.55     RPE 14     Perceived Dyspnea  0     VO2 Peak 5.42     Symptoms Yes (comment)     Comments no pain, a little winded. resolved with rest     Resting HR 63 bpm     Resting BP 112/86     Resting Oxygen Saturation  97 %     Exercise Oxygen Saturation  during 6 min walk 97 %     Max Ex. HR 110 bpm     Max Ex. BP 142/82     2 Minute Post BP 122/86        Oxygen Initial Assessment:   Oxygen Re-Evaluation:   Oxygen Discharge (Final Oxygen Re-Evaluation):   Initial Exercise Prescription:  Initial Exercise Prescription - 09/05/23 1500       Date of Initial Exercise RX and Referring Provider   Date 09/05/23    Referring Provider Lonni Cash, MD    Expected Discharge Date 11/27/23      NuStep   Level 1    SPM 70    Minutes 15    METs 1.5      Track   Laps 13    Minutes 15    METs 1.5  Prescription Details   Frequency (times per week) 3    Duration Progress to 30  minutes of continuous aerobic without signs/symptoms of physical distress      Intensity   THRR 40-80% of Max Heartrate 61-122    Ratings of Perceived Exertion 11-13    Perceived Dyspnea 0-4      Progression   Progression Continue progressive overload as per policy without signs/symptoms or physical distress.      Resistance Training   Training Prescription Yes    Weight 3    Reps 10-15          Perform Capillary Blood Glucose checks as needed.  Exercise Prescription Changes:   Exercise Comments:   Exercise Goals and Review:   Exercise Goals     Row Name 09/05/23 1254             Exercise Goals   Increase Physical Activity Yes       Intervention Provide advice, education, support and counseling about physical activity/exercise needs.;Develop an individualized exercise prescription for aerobic and resistive training based on initial evaluation findings, risk stratification, comorbidities and participant's personal goals.       Expected Outcomes Short Term: Attend rehab on a regular basis to increase amount of physical activity.;Long Term: Exercising regularly at least 3-5 days a week.;Long Term: Add in home exercise to make exercise part of routine and to increase amount of physical activity.       Increase Strength and Stamina Yes       Intervention Develop an individualized exercise prescription for aerobic and resistive training based on initial evaluation findings, risk stratification, comorbidities and participant's personal goals.;Provide advice, education, support and counseling about physical activity/exercise needs.       Expected Outcomes Short Term: Increase workloads from initial exercise prescription for resistance, speed, and METs.;Short Term: Perform resistance training exercises routinely during rehab and add in resistance training at home;Long Term: Improve cardiorespiratory fitness, muscular endurance and strength as measured by increased METs and  functional capacity ( )       Able to understand and use rate of perceived exertion (RPE) scale Yes       Intervention Provide education and explanation on how to use RPE scale       Expected Outcomes Short Term: Able to use RPE daily in rehab to express subjective intensity level;Long Term:  Able to use RPE to guide intensity level when exercising independently       Knowledge and understanding of Target Heart Rate Range (THRR) Yes       Intervention Provide education and explanation of THRR including how the numbers were predicted and where they are located for reference       Expected Outcomes Short Term: Able to state/look up THRR;Short Term: Able to use daily as guideline for intensity in rehab;Long Term: Able to use THRR to govern intensity when exercising independently       Understanding of Exercise Prescription Yes       Intervention Provide education, explanation, and written materials on patient's individual exercise prescription       Expected Outcomes Long Term: Able to explain home exercise prescription to exercise independently;Short Term: Able to explain program exercise prescription          Exercise Goals Re-Evaluation :   Discharge Exercise Prescription (Final Exercise Prescription Changes):   Nutrition:  Target Goals: Understanding of nutrition guidelines, daily intake of sodium 1500mg , cholesterol 200mg , calories 30% from fat and 7% or less from  saturated fats, daily to have 5 or more servings of fruits and vegetables.  Biometrics:  Pre Biometrics - 09/05/23 1252       Pre Biometrics   Waist Circumference 59 inches    Hip Circumference 53 inches    Waist to Hip Ratio 1.11 %    Triceps Skinfold 48 mm    % Body Fat 50.8 %    Grip Strength 29 kg    Flexibility 9 in    Single Leg Stand 1 seconds           Nutrition Therapy Plan and Nutrition Goals:   Nutrition Assessments:  MEDIFICTS Score Key: >=70 Need to make dietary changes  40-70 Heart  Healthy Diet <= 40 Therapeutic Level Cholesterol Diet    Picture Your Plate Scores: <59 Unhealthy dietary pattern with much room for improvement. 41-50 Dietary pattern unlikely to meet recommendations for good health and room for improvement. 51-60 More healthful dietary pattern, with some room for improvement.  >60 Healthy dietary pattern, although there may be some specific behaviors that could be improved.    Nutrition Goals Re-Evaluation:   Nutrition Goals Re-Evaluation:   Nutrition Goals Discharge (Final Nutrition Goals Re-Evaluation):   Psychosocial: Target Goals: Acknowledge presence or absence of significant depression and/or stress, maximize coping skills, provide positive support system. Participant is able to verbalize types and ability to use techniques and skills needed for reducing stress and depression.  Initial Review & Psychosocial Screening:  Initial Psych Review & Screening - 09/05/23 1358       Initial Review   Current issues with None Identified      Family Dynamics   Good Support System? Yes   friends and family     Barriers   Psychosocial barriers to participate in program There are no identifiable barriers or psychosocial needs.      Screening Interventions   Interventions Provide feedback about the scores to participant;Encouraged to exercise    Expected Outcomes Long Term goal: The participant improves quality of Life and PHQ9 Scores as seen by post scores and/or verbalization of changes;Short Term goal: Identification and review with participant of any Quality of Life or Depression concerns found by scoring the questionnaire.          Quality of Life Scores:  Quality of Life - 09/05/23 1531       Quality of Life   Select Quality of Life      Quality of Life Scores   Health/Function Pre 22.47 %    Socioeconomic Pre 27.25 %    Psych/Spiritual Pre 26.07 %    Family Pre 24.7 %    GLOBAL Pre 24.6 %         Scores of 19 and below  usually indicate a poorer quality of life in these areas.  A difference of  2-3 points is a clinically meaningful difference.  A difference of 2-3 points in the total score of the Quality of Life Index has been associated with significant improvement in overall quality of life, self-image, physical symptoms, and general health in studies assessing change in quality of life.  PHQ-9: Review Flowsheet  More data exists      09/05/2023 02/18/2023 07/12/2022 07/10/2022 08/01/2020  Depression screen PHQ 2/9  Decreased Interest 0 0 2 0 0  Down, Depressed, Hopeless 0 0 0 0 0  PHQ - 2 Score 0 0 2 0 0  Altered sleeping 0 0 2 - 0  Tired, decreased energy 0 0 2 -  0  Change in appetite 0 0 2 - 0  Feeling bad or failure about yourself  0 0 2 - 0  Trouble concentrating 0 0 0 - 0  Moving slowly or fidgety/restless 0 0 0 - 0  Suicidal thoughts 0 0 0 - 0  PHQ-9 Score 0 0 10 - 0  Difficult doing work/chores Not difficult at all - - - -   Interpretation of Total Score  Total Score Depression Severity:  1-4 = Minimal depression, 5-9 = Mild depression, 10-14 = Moderate depression, 15-19 = Moderately severe depression, 20-27 = Severe depression   Psychosocial Evaluation and Intervention:   Psychosocial Re-Evaluation:   Psychosocial Discharge (Final Psychosocial Re-Evaluation):   Vocational Rehabilitation: Provide vocational rehab assistance to qualifying candidates.   Vocational Rehab Evaluation & Intervention:  Vocational Rehab - 09/05/23 1359       Initial Vocational Rehab Evaluation & Intervention   Assessment shows need for Vocational Rehabilitation No   retired         Education: Education Goals: Education classes will be provided on a weekly basis, covering required topics. Participant will state understanding/return demonstration of topics presented.     Core Videos: Exercise    Move It!  Clinical staff conducted group or individual video education with verbal and written material  and guidebook.  Patient learns the recommended Pritikin exercise program. Exercise with the goal of living a long, healthy life. Some of the health benefits of exercise include controlled diabetes, healthier blood pressure levels, improved cholesterol levels, improved heart and lung capacity, improved sleep, and better body composition. Everyone should speak with their doctor before starting or changing an exercise routine.  Biomechanical Limitations Clinical staff conducted group or individual video education with verbal and written material and guidebook.  Patient learns how biomechanical limitations can impact exercise and how we can mitigate and possibly overcome limitations to have an impactful and balanced exercise routine.  Body Composition Clinical staff conducted group or individual video education with verbal and written material and guidebook.  Patient learns that body composition (ratio of muscle mass to fat mass) is a key component to assessing overall fitness, rather than body weight alone. Increased fat mass, especially visceral belly fat, can put us  at increased risk for metabolic syndrome, type 2 diabetes, heart disease, and even death. It is recommended to combine diet and exercise (cardiovascular and resistance training) to improve your body composition. Seek guidance from your physician and exercise physiologist before implementing an exercise routine.  Exercise Action Plan Clinical staff conducted group or individual video education with verbal and written material and guidebook.  Patient learns the recommended strategies to achieve and enjoy long-term exercise adherence, including variety, self-motivation, self-efficacy, and positive decision making. Benefits of exercise include fitness, good health, weight management, more energy, better sleep, less stress, and overall well-being.  Medical   Heart Disease Risk Reduction Clinical staff conducted group or individual video  education with verbal and written material and guidebook.  Patient learns our heart is our most vital organ as it circulates oxygen, nutrients, white blood cells, and hormones throughout the entire body, and carries waste away. Data supports a plant-based eating plan like the Pritikin Program for its effectiveness in slowing progression of and reversing heart disease. The video provides a number of recommendations to address heart disease.   Metabolic Syndrome and Belly Fat  Clinical staff conducted group or individual video education with verbal and written material and guidebook.  Patient learns what metabolic syndrome is,  how it leads to heart disease, and how one can reverse it and keep it from coming back. You have metabolic syndrome if you have 3 of the following 5 criteria: abdominal obesity, high blood pressure, high triglycerides, low HDL cholesterol, and high blood sugar.  Hypertension and Heart Disease Clinical staff conducted group or individual video education with verbal and written material and guidebook.  Patient learns that high blood pressure, or hypertension, is very common in the [place] . Hypertension is largely due to excessive salt intake, but other important risk factors include being overweight, physical inactivity, drinking too much alcohol, smoking, and not eating enough potassium from fruits and vegetables. High blood pressure is a leading risk factor for heart attack, stroke, congestive heart failure, dementia, kidney failure, and premature death. Long-term effects of excessive salt intake include stiffening of the arteries and thickening of heart muscle and organ damage. Recommendations include ways to reduce hypertension and the risk of heart disease.  Diseases of Our Time - Focusing on Diabetes Clinical staff conducted group or individual video education with verbal and written material and guidebook.  Patient learns why the best way to stop diseases of our time is  prevention, through food and other lifestyle changes. Medicine (such as prescription pills and surgeries) is often only a Band-Aid on the problem, not a long-term solution. Most common diseases of our time include obesity, type 2 diabetes, hypertension, heart disease, and cancer. The Pritikin Program is recommended and has been proven to help reduce, reverse, and/or prevent the damaging effects of metabolic syndrome.  Nutrition   Overview of the Pritikin Eating Plan  Clinical staff conducted group or individual video education with verbal and written material and guidebook.  Patient learns about the Pritikin Eating Plan for disease risk reduction. The Pritikin Eating Plan emphasizes a wide variety of unrefined, minimally-processed carbohydrates, like fruits, vegetables, whole grains, and legumes. Go, Caution, and Stop food choices are explained. Plant-based and lean animal proteins are emphasized. Rationale provided for low sodium intake for blood pressure control, low added sugars for blood sugar stabilization, and low added fats and oils for coronary artery disease risk reduction and weight management.  Calorie Density  Clinical staff conducted group or individual video education with verbal and written material and guidebook.  Patient learns about calorie density and how it impacts the Pritikin Eating Plan. Knowing the characteristics of the food you choose will help you decide whether those foods will lead to weight gain or weight loss, and whether you want to consume more or less of them. Weight loss is usually a side effect of the Pritikin Eating Plan because of its focus on low calorie-dense foods.  Label Reading  Clinical staff conducted group or individual video education with verbal and written material and guidebook.  Patient learns about the Pritikin recommended label reading guidelines and corresponding recommendations regarding calorie density, added sugars, sodium content, and whole  grains.  Dining Out - Part 1  Clinical staff conducted group or individual video education with verbal and written material and guidebook.  Patient learns that restaurant meals can be sabotaging because they can be so high in calories, fat, sodium, and/or sugar. Patient learns recommended strategies on how to positively address this and avoid unhealthy pitfalls.  Facts on Fats  Clinical staff conducted group or individual video education with verbal and written material and guidebook.  Patient learns that lifestyle modifications can be just as effective, if not more so, as many medications for lowering your risk  of heart disease. A Pritikin lifestyle can help to reduce your risk of inflammation and atherosclerosis (cholesterol build-up, or plaque, in the artery walls). Lifestyle interventions such as dietary choices and physical activity address the cause of atherosclerosis. A review of the types of fats and their impact on blood cholesterol levels, along with dietary recommendations to reduce fat intake is also included.  Nutrition Action Plan  Clinical staff conducted group or individual video education with verbal and written material and guidebook.  Patient learns how to incorporate Pritikin recommendations into their lifestyle. Recommendations include planning and keeping personal health goals in mind as an important part of their success.  Healthy Mind-Set    Healthy Minds, Bodies, Hearts  Clinical staff conducted group or individual video education with verbal and written material and guidebook.  Patient learns how to identify when they are stressed. Video will discuss the impact of that stress, as well as the many benefits of stress management. Patient will also be introduced to stress management techniques. The way we think, act, and feel has an impact on our hearts.  How Our Thoughts Can Heal Our Hearts  Clinical staff conducted group or individual video education with verbal and  written material and guidebook.  Patient learns that negative thoughts can cause depression and anxiety. This can result in negative lifestyle behavior and serious health problems. Cognitive behavioral therapy is an effective method to help control our thoughts in order to change and improve our emotional outlook.  Additional Videos:  Exercise    Improving Performance  Clinical staff conducted group or individual video education with verbal and written material and guidebook.  Patient learns to use a non-linear approach by alternating intensity levels and lengths of time spent exercising to help burn more calories and lose more body fat. Cardiovascular exercise helps improve heart health, metabolism, hormonal balance, blood sugar control, and recovery from fatigue. Resistance training improves strength, endurance, balance, coordination, reaction time, metabolism, and muscle mass. Flexibility exercise improves circulation, posture, and balance. Seek guidance from your physician and exercise physiologist before implementing an exercise routine and learn your capabilities and proper form for all exercise.  Introduction to Yoga  Clinical staff conducted group or individual video education with verbal and written material and guidebook.  Patient learns about yoga, a discipline of the coming together of mind, breath, and body. The benefits of yoga include improved flexibility, improved range of motion, better posture and core strength, increased lung function, weight loss, and positive self-image. Yoga's heart health benefits include lowered blood pressure, healthier heart rate, decreased cholesterol and triglyceride levels, improved immune function, and reduced stress. Seek guidance from your physician and exercise physiologist before implementing an exercise routine and learn your capabilities and proper form for all exercise.  Medical   Aging: Enhancing Your Quality of Life  Clinical staff conducted  group or individual video education with verbal and written material and guidebook.  Patient learns key strategies and recommendations to stay in good physical health and enhance quality of life, such as prevention strategies, having an advocate, securing a Health Care Proxy and Power of Attorney, and keeping a list of medications and system for tracking them. It also discusses how to avoid risk for bone loss.  Biology of Weight Control  Clinical staff conducted group or individual video education with verbal and written material and guidebook.  Patient learns that weight gain occurs because we consume more calories than we burn (eating more, moving less). Even if your body weight is  normal, you may have higher ratios of fat compared to muscle mass. Too much body fat puts you at increased risk for cardiovascular disease, heart attack, stroke, type 2 diabetes, and obesity-related cancers. In addition to exercise, following the Pritikin Eating Plan can help reduce your risk.  Decoding Lab Results  Clinical staff conducted group or individual video education with verbal and written material and guidebook.  Patient learns that lab test reflects one measurement whose values change over time and are influenced by many factors, including medication, stress, sleep, exercise, food, hydration, pre-existing medical conditions, and more. It is recommended to use the knowledge from this video to become more involved with your lab results and evaluate your numbers to speak with your doctor.   Diseases of Our Time - Overview  Clinical staff conducted group or individual video education with verbal and written material and guidebook.  Patient learns that according to the CDC, 50% to 70% of chronic diseases (such as obesity, type 2 diabetes, elevated lipids, hypertension, and heart disease) are avoidable through lifestyle improvements including healthier food choices, listening to satiety cues, and increased physical  activity.  Sleep Disorders Clinical staff conducted group or individual video education with verbal and written material and guidebook.  Patient learns how good quality and duration of sleep are important to overall health and well-being. Patient also learns about sleep disorders and how they impact health along with recommendations to address them, including discussing with a physician.  Nutrition  Dining Out - Part 2 Clinical staff conducted group or individual video education with verbal and written material and guidebook.  Patient learns how to plan ahead and communicate in order to maximize their dining experience in a healthy and nutritious manner. Included are recommended food choices based on the type of restaurant the patient is visiting.   Fueling a Banker conducted group or individual video education with verbal and written material and guidebook.  There is a strong connection between our food choices and our health. Diseases like obesity and type 2 diabetes are very prevalent and are in large-part due to lifestyle choices. The Pritikin Eating Plan provides plenty of food and hunger-curbing satisfaction. It is easy to follow, affordable, and helps reduce health risks.  Menu Workshop  Clinical staff conducted group or individual video education with verbal and written material and guidebook.  Patient learns that restaurant meals can sabotage health goals because they are often packed with calories, fat, sodium, and sugar. Recommendations include strategies to plan ahead and to communicate with the manager, chef, or server to help order a healthier meal.  Planning Your Eating Strategy  Clinical staff conducted group or individual video education with verbal and written material and guidebook.  Patient learns about the Pritikin Eating Plan and its benefit of reducing the risk of disease. The Pritikin Eating Plan does not focus on calories. Instead, it emphasizes  high-quality, nutrient-rich foods. By knowing the characteristics of the foods, we choose, we can determine their calorie density and make informed decisions.  Targeting Your Nutrition Priorities  Clinical staff conducted group or individual video education with verbal and written material and guidebook.  Patient learns that lifestyle habits have a tremendous impact on disease risk and progression. This video provides eating and physical activity recommendations based on your personal health goals, such as reducing LDL cholesterol, losing weight, preventing or controlling type 2 diabetes, and reducing high blood pressure.  Vitamins and Minerals  Clinical staff conducted group or individual video  education with verbal and written material and guidebook.  Patient learns different ways to obtain key vitamins and minerals, including through a recommended healthy diet. It is important to discuss all supplements you take with your doctor.   Healthy Mind-Set    Smoking Cessation  Clinical staff conducted group or individual video education with verbal and written material and guidebook.  Patient learns that cigarette smoking and tobacco addiction pose a serious health risk which affects millions of people. Stopping smoking will significantly reduce the risk of heart disease, lung disease, and many forms of cancer. Recommended strategies for quitting are covered, including working with your doctor to develop a successful plan.  Culinary   Becoming a Set designer conducted group or individual video education with verbal and written material and guidebook.  Patient learns that cooking at home can be healthy, cost-effective, quick, and puts them in control. Keys to cooking healthy recipes will include looking at your recipe, assessing your equipment needs, planning ahead, making it simple, choosing cost-effective seasonal ingredients, and limiting the use of added fats, salts, and  sugars.  Cooking - Breakfast and Snacks  Clinical staff conducted group or individual video education with verbal and written material and guidebook.  Patient learns how important breakfast is to satiety and nutrition through the entire day. Recommendations include key foods to eat during breakfast to help stabilize blood sugar levels and to prevent overeating at meals later in the day. Planning ahead is also a key component.  Cooking - Educational psychologist conducted group or individual video education with verbal and written material and guidebook.  Patient learns eating strategies to improve overall health, including an approach to cook more at home. Recommendations include thinking of animal protein as a side on your plate rather than center stage and focusing instead on lower calorie dense options like vegetables, fruits, whole grains, and plant-based proteins, such as beans. Making sauces in large quantities to freeze for later and leaving the skin on your vegetables are also recommended to maximize your experience.  Cooking - Healthy Salads and Dressing Clinical staff conducted group or individual video education with verbal and written material and guidebook.  Patient learns that vegetables, fruits, whole grains, and legumes are the foundations of the Pritikin Eating Plan. Recommendations include how to incorporate each of these in flavorful and healthy salads, and how to create homemade salad dressings. Proper handling of ingredients is also covered. Cooking - Soups and [employer] - Soups and Desserts Clinical staff conducted group or individual video education with verbal and written material and guidebook.  Patient learns that Pritikin soups and desserts make for easy, nutritious, and delicious snacks and meal components that are low in sodium, fat, sugar, and calorie density, while high in vitamins, minerals, and filling fiber. Recommendations include simple and healthy  ideas for soups and desserts.   Overview     The Pritikin Solution Program Overview Clinical staff conducted group or individual video education with verbal and written material and guidebook.  Patient learns that the results of the Pritikin Program have been documented in more than 100 articles published in peer-reviewed journals, and the benefits include reducing risk factors for (and, in some cases, even reversing) high cholesterol, high blood pressure, type 2 diabetes, obesity, and more! An overview of the three key pillars of the Pritikin Program will be covered: eating well, doing regular exercise, and having a healthy mind-set.  WORKSHOPS  Exercise: Exercise Basics: Building  Your Action Plan Clinical staff led group instruction and group discussion with PowerPoint presentation and patient guidebook. To enhance the learning environment the use of posters, models and videos may be added. At the conclusion of this workshop, patients will comprehend the difference between physical activity and exercise, as well as the benefits of incorporating both, into their routine. Patients will understand the FITT (Frequency, Intensity, Time, and Type) principle and how to use it to build an exercise action plan. In addition, safety concerns and other considerations for exercise and cardiac rehab will be addressed by the presenter. The purpose of this lesson is to promote a comprehensive and effective weekly exercise routine in order to improve patients' overall level of fitness.   Managing Heart Disease: Your Path to a Healthier Heart Clinical staff led group instruction and group discussion with PowerPoint presentation and patient guidebook. To enhance the learning environment the use of posters, models and videos may be added.At the conclusion of this workshop, patients will understand the anatomy and physiology of the heart. Additionally, they will understand how Pritikin's three pillars impact the  risk factors, the progression, and the management of heart disease.  The purpose of this lesson is to provide a high-level overview of the heart, heart disease, and how the Pritikin lifestyle positively impacts risk factors.  Exercise Biomechanics Clinical staff led group instruction and group discussion with PowerPoint presentation and patient guidebook. To enhance the learning environment the use of posters, models and videos may be added. Patients will learn how the structural parts of their bodies function and how these functions impact their daily activities, movement, and exercise. Patients will learn how to promote a neutral spine, learn how to manage pain, and identify ways to improve their physical movement in order to promote healthy living. The purpose of this lesson is to expose patients to common physical limitations that impact physical activity. Participants will learn practical ways to adapt and manage aches and pains, and to minimize their effect on regular exercise. Patients will learn how to maintain good posture while sitting, walking, and lifting.  Balance Training and Fall Prevention  Clinical staff led group instruction and group discussion with PowerPoint presentation and patient guidebook. To enhance the learning environment the use of posters, models and videos may be added. At the conclusion of this workshop, patients will understand the importance of their sensorimotor skills (vision, proprioception, and the vestibular system) in maintaining their ability to balance as they age. Patients will apply a variety of balancing exercises that are appropriate for their current level of function. Patients will understand the common causes for poor balance, possible solutions to these problems, and ways to modify their physical environment in order to minimize their fall risk. The purpose of this lesson is to teach patients about the importance of maintaining balance as they age  and ways to minimize their risk of falling.  WORKSHOPS   Nutrition:  Fueling a Ship broker led group instruction and group discussion with PowerPoint presentation and patient guidebook. To enhance the learning environment the use of posters, models and videos may be added. Patients will review the foundational principles of the Pritikin Eating Plan and understand what constitutes a serving size in each of the food groups. Patients will also learn Pritikin-friendly foods that are better choices when away from home and review make-ahead meal and snack options. Calorie density will be reviewed and applied to three nutrition priorities: weight maintenance, weight loss, and weight gain. The purpose of  this lesson is to reinforce (in a group setting) the key concepts around what patients are recommended to eat and how to apply these guidelines when away from home by planning and selecting Pritikin-friendly options. Patients will understand how calorie density may be adjusted for different weight management goals.  Mindful Eating  Clinical staff led group instruction and group discussion with PowerPoint presentation and patient guidebook. To enhance the learning environment the use of posters, models and videos may be added. Patients will briefly review the concepts of the Pritikin Eating Plan and the importance of low-calorie dense foods. The concept of mindful eating will be introduced as well as the importance of paying attention to internal hunger signals. Triggers for non-hunger eating and techniques for dealing with triggers will be explored. The purpose of this lesson is to provide patients with the opportunity to review the basic principles of the Pritikin Eating Plan, discuss the value of eating mindfully and how to measure internal cues of hunger and fullness using the Hunger Scale. Patients will also discuss reasons for non-hunger eating and learn strategies to use for controlling  emotional eating.  Targeting Your Nutrition Priorities Clinical staff led group instruction and group discussion with PowerPoint presentation and patient guidebook. To enhance the learning environment the use of posters, models and videos may be added. Patients will learn how to determine their genetic susceptibility to disease by reviewing their family history. Patients will gain insight into the importance of diet as part of an overall healthy lifestyle in mitigating the impact of genetics and other environmental insults. The purpose of this lesson is to provide patients with the opportunity to assess their personal nutrition priorities by looking at their family history, their own health history and current risk factors. Patients will also be able to discuss ways of prioritizing and modifying the Pritikin Eating Plan for their highest risk areas  Menu  Clinical staff led group instruction and group discussion with PowerPoint presentation and patient guidebook. To enhance the learning environment the use of posters, models and videos may be added. Using menus brought in from [employer], or printed from [employer], patients will apply the Pritikin dining out guidelines that were presented in the [employer] video. Patients will also be able to practice these guidelines in a variety of provided scenarios. The purpose of this lesson is to provide patients with the opportunity to practice hands-on learning of the Pritikin Dining Out guidelines with actual menus and practice scenarios.  Label Reading Clinical staff led group instruction and group discussion with PowerPoint presentation and patient guidebook. To enhance the learning environment the use of posters, models and videos may be added. Patients will review and discuss the Pritikin label reading guidelines presented in Pritikin's Label Reading Educational series video. Using fool labels brought in from local grocery stores  and markets, patients will apply the label reading guidelines and determine if the packaged food meet the Pritikin guidelines. The purpose of this lesson is to provide patients with the opportunity to review, discuss, and practice hands-on learning of the Pritikin Label Reading guidelines with actual packaged food labels. Cooking School  Pritikin's [employer] are designed to teach patients ways to prepare quick, simple, and affordable recipes at home. The importance of nutrition's role in chronic disease risk reduction is reflected in its emphasis in the overall Pritikin program. By learning how to prepare essential core Pritikin Eating Plan recipes, patients will increase control over what they eat; be able to  customize the flavor of foods without the use of added salt, sugar, or fat; and improve the quality of the food they consume. By learning a set of core recipes which are easily assembled, quickly prepared, and affordable, patients are more likely to prepare more healthy foods at home. These workshops focus on convenient breakfasts, simple entres, side dishes, and desserts which can be prepared with minimal effort and are consistent with nutrition recommendations for cardiovascular risk reduction. Cooking [employer] are taught by a Armed forces logistics/support/administrative officer (RD) who has been trained by the [employer]. The chef or RD has a clear understanding of the importance of minimizing - if not completely eliminating - added fat, sugar, and sodium in recipes. Throughout the series of Cooking School Workshop sessions, patients will learn about healthy ingredients and efficient methods of cooking to build confidence in their capability to prepare    Cooking School weekly topics:  Adding Flavor- Sodium-Free  Fast and Healthy Breakfasts  Powerhouse Plant-Based Proteins  Satisfying Salads and Dressings  Simple Sides and Sauces  International Cuisine-Spotlight on the [employer]  Zones  Delicious Desserts  Savory Soups  [employer] - Meals in a Astronomer Appetizers and Snacks  Comforting Weekend Breakfasts  One-Pot Wonders   Fast Evening Meals  Landscape architect Your Pritikin Plate  WORKSHOPS   Healthy Mindset (Psychosocial):  Focused Goals, Sustainable Changes Clinical staff led group instruction and group discussion with PowerPoint presentation and patient guidebook. To enhance the learning environment the use of posters, models and videos may be added. Patients will be able to apply effective goal setting strategies to establish at least one personal goal, and then take consistent, meaningful action toward that goal. They will learn to identify common barriers to achieving personal goals and develop strategies to overcome them. Patients will also gain an understanding of how our mind-set can impact our ability to achieve goals and the importance of cultivating a positive and growth-oriented mind-set. The purpose of this lesson is to provide patients with a deeper understanding of how to set and achieve personal goals, as well as the tools and strategies needed to overcome common obstacles which may arise along the way.  From Head to Heart: The Power of a Healthy Outlook  Clinical staff led group instruction and group discussion with PowerPoint presentation and patient guidebook. To enhance the learning environment the use of posters, models and videos may be added. Patients will be able to recognize and describe the impact of emotions and mood on physical health. They will discover the importance of self-care and explore self-care practices which may work for them. Patients will also learn how to utilize the 4 C's to cultivate a healthier outlook and better manage stress and challenges. The purpose of this lesson is to demonstrate to patients how a healthy outlook is an essential part of maintaining good health, especially as they continue their  cardiac rehab journey.  Healthy Sleep for a Healthy Heart Clinical staff led group instruction and group discussion with PowerPoint presentation and patient guidebook. To enhance the learning environment the use of posters, models and videos may be added. At the conclusion of this workshop, patients will be able to demonstrate knowledge of the importance of sleep to overall health, well-being, and quality of life. They will understand the symptoms of, and treatments for, common sleep disorders. Patients will also be able to identify daytime and nighttime behaviors which impact sleep, and they will be able to  apply these tools to help manage sleep-related challenges. The purpose of this lesson is to provide patients with a general overview of sleep and outline the importance of quality sleep. Patients will learn about a few of the most common sleep disorders. Patients will also be introduced to the concept of "sleep hygiene," and discover ways to self-manage certain sleeping problems through simple daily behavior changes. Finally, the workshop will motivate patients by clarifying the links between quality sleep and their goals of heart-healthy living.   Recognizing and Reducing Stress Clinical staff led group instruction and group discussion with PowerPoint presentation and patient guidebook. To enhance the learning environment the use of posters, models and videos may be added. At the conclusion of this workshop, patients will be able to understand the types of stress reactions, differentiate between acute and chronic stress, and recognize the impact that chronic stress has on their health. They will also be able to apply different coping mechanisms, such as reframing negative self-talk. Patients will have the opportunity to practice a variety of stress management techniques, such as deep abdominal breathing, progressive muscle relaxation, and/or guided imagery.  The purpose of this lesson is to educate  patients on the role of stress in their lives and to provide healthy techniques for coping with it.  Learning Barriers/Preferences:  Learning Barriers/Preferences - 09/05/23 1400       Learning Barriers/Preferences   Learning Barriers Sight   glasses   Learning Preferences Audio;Computer/Internet;Skilled Demonstration;Group Instruction;Individual Instruction;Pictoral;Verbal Instruction;Video;Written Material          Education Topics:  Knowledge Questionnaire Score:  Knowledge Questionnaire Score - 09/05/23 1411       Knowledge Questionnaire Score   Pre Score 18/24          Core Components/Risk Factors/Patient Goals at Admission:  Personal Goals and Risk Factors at Admission - 09/05/23 1359       Core Components/Risk Factors/Patient Goals on Admission    Weight Management Yes;Obesity;Weight Loss    Intervention Weight Management: Develop a combined nutrition and exercise program designed to reach desired caloric intake, while maintaining appropriate intake of nutrient and fiber, sodium and fats, and appropriate energy expenditure required for the weight goal.;Weight Management: Provide education and appropriate resources to help participant work on and attain dietary goals.;Weight Management/Obesity: Establish reasonable short term and long term weight goals.;Obesity: Provide education and appropriate resources to help participant work on and attain dietary goals.    Admit Weight 326 lb 8 oz (148.1 kg)    Goal Weight: Long Term 226 lb (102.5 kg)   pt goal   Diabetes Yes    Intervention Provide education about signs/symptoms and action to take for hypo/hyperglycemia.;Provide education about proper nutrition, including hydration, and aerobic/resistive exercise prescription along with prescribed medications to achieve blood glucose in normal ranges: Fasting glucose 65-99 mg/dL    Expected Outcomes Short Term: Participant verbalizes understanding of the signs/symptoms and immediate  care of hyper/hypoglycemia, proper foot care and importance of medication, aerobic/resistive exercise and nutrition plan for blood glucose control.;Long Term: Attainment of HbA1C < 7%.    Heart Failure Yes    Intervention Provide a combined exercise and nutrition program that is supplemented with education, support and counseling about heart failure. Directed toward relieving symptoms such as shortness of breath, decreased exercise tolerance, and extremity edema.    Expected Outcomes Improve functional capacity of life;Short term: Attendance in program 2-3 days a week with increased exercise capacity. Reported lower sodium intake. Reported increased fruit and vegetable intake.  Reports medication compliance.;Short term: Daily weights obtained and reported for increase. Utilizing diuretic protocols set by physician.;Long term: Adoption of self-care skills and reduction of barriers for early signs and symptoms recognition and intervention leading to self-care maintenance.    Hypertension Yes    Intervention Provide education on lifestyle modifcations including regular physical activity/exercise, weight management, moderate sodium restriction and increased consumption of fresh fruit, vegetables, and low fat dairy, alcohol moderation, and smoking cessation.;Monitor prescription use compliance.    Expected Outcomes Long Term: Maintenance of blood pressure at goal levels.;Short Term: Continued assessment and intervention until BP is < 140/75mm HG in hypertensive participants. < 130/43mm HG in hypertensive participants with diabetes, heart failure or chronic kidney disease.    Lipids Yes    Intervention Provide education and support for participant on nutrition & aerobic/resistive exercise along with prescribed medications to achieve LDL 70mg , HDL >40mg .    Expected Outcomes Short Term: Participant states understanding of desired cholesterol values and is compliant with medications prescribed. Participant is  following exercise prescription and nutrition guidelines.;Long Term: Cholesterol controlled with medications as prescribed, with individualized exercise RX and with personalized nutrition plan. Value goals: LDL < 70mg , HDL > 40 mg.          Core Components/Risk Factors/Patient Goals Review:    Core Components/Risk Factors/Patient Goals at Discharge (Final Review):    ITP Comments:  ITP Comments     Row Name 09/05/23 1253           ITP Comments Dr. Wilbert Bihari medical director. Introduction to [facility] education/intensive cardiac rehab. Initial orientation packet reviewed with patient.          Comments: Participant attended orientation for the cardiac rehabilitation program on  09/05/2023  to perform initial intake and exercise walk test. Patient introduced to the [facility] education and orientation packet was reviewed. Completed 6-minute walk test, measurements, initial ITP, and exercise prescription. Vital signs stable. Telemetry-normal sinus rhythm, asymptomatic.   Service time was from 1258 to 1503.  Con KATHEE Pereyra, MS, ACSM-CEP 09/05/2023 3:35 PM

## 2023-09-05 NOTE — Progress Notes (Signed)
 Cardiac Rehab Medication Review   Does the patient  feel that his/her medications are working for him/her? Yes    Has the patient been experiencing any side effects to the medications prescribed? No  Does the patient measure his/her own blood pressure or blood glucose at home?   No  Does the patient have any problems obtaining medications due to transportation or finances?   No  Understanding of regimen: good Understanding of indications: good Potential of compliance: good    Comments: Alex [name] understands his medications and regime okay. He has a BP cuff but doesn't check his BP often.    Con Alex Pereyra, MS, ACSM-CEP 09/05/2023 1:56 PM

## 2023-09-09 NOTE — Progress Notes (Signed)
 Cardiac Individual Treatment Plan  Patient Details  Name: Alex [name] MRN: [id number] Date of Birth: 1955-12-14 Referring Provider:   Flowsheet Row CARDIAC REHAB PHASE II ORIENTATION from 09/05/2023 in [facility]  Referring Provider Lonni Cash, MD    Initial Encounter Date:  Flowsheet Row CARDIAC REHAB PHASE II ORIENTATION from 09/05/2023 in [facility]  Date 09/05/23    Visit Diagnosis: 08/14/23 DES LAD  Patient's Home Medications on Admission:  Current Outpatient Medications:    acetaminophen  (TYLENOL ) 500 MG tablet, Take 500 mg by mouth every 6 (six) hours as needed for moderate pain., Disp: , Rfl:    allopurinol  (ZYLOPRIM ) 100 MG tablet, Take 1 tablet (100 mg total) by mouth daily as needed (gout)., Disp: , Rfl:    amLODipine  (NORVASC ) 10 MG tablet, TAKE 1 TABLET BY MOUTH ONCE DAILY WITH SUPPER, Disp: 90 tablet, Rfl: 0   aspirin  81 MG chewable tablet, Chew 1 tablet (81 mg total) by mouth daily., Disp: 30 tablet, Rfl: 2   colchicine  0.6 MG tablet, Take 1 tablet (0.6 mg total) by mouth daily as needed (gout)., Disp: , Rfl:    Evolocumab  (REPATHA  SURECLICK) 140 MG/ML SOAJ, INJECT 140 MG INTO THE SKIN EVERY 14 DAYS, Disp: 2 mL, Rfl: 11   irbesartan  (AVAPRO ) 150 MG tablet, Take 1 tablet (150 mg total) by mouth daily. Please make overdue appt with Dr. Cash before anymore refills  Thank you 3rd and Final attempt, Disp: 15 tablet, Rfl: 0   metoprolol  succinate (TOPROL -XL) 25 MG 24 hr tablet, Take 1 tablet (25 mg total) by mouth daily., Disp: 30 tablet, Rfl: 11   naproxen  (NAPROSYN ) 500 MG tablet, TAKE 1 TABLET BY MOUTH ONCE DAILY AS NEEDED FOR MODERATE PAIN, Disp: 30 tablet, Rfl: 0   nitroGLYCERIN  (NITROSTAT ) 0.4 MG SL tablet, Place 1 tablet (0.4 mg total) under the tongue every 5 (five) minutes as needed for chest pain., Disp: 25 tablet, Rfl: 3   omeprazole   (PRILOSEC) 40 MG capsule, TAKE 1 CAPSULE BY MOUTH ONCE DAILY WITH SUPPER, Disp: 90 capsule, Rfl: 0   prasugrel  (EFFIENT ) 10 MG TABS tablet, Take 1 tablet (10 mg total) by mouth daily., Disp: 90 tablet, Rfl: 3   triamcinolone  ointment (KENALOG ) 0.5 %, APPLY OINTMENT TOPICALLY TO AFFECTED AREA AS NEEDED, Disp: 15 g, Rfl: 2   glucose blood test strip, 1 each by Other route daily. Use as instructed (Patient not taking: Reported on 09/05/2023), Disp: 100 each, Rfl: 12  Past Medical History: Past Medical History:  Diagnosis Date   Asthma    CAD (coronary artery disease)    Cardiac cath 2002 with 25% distal RCA stenosis.    DDD (degenerative disc disease), lumbar    Diabetes mellitus (HCC)    Diverticulitis    Esophageal stricture    Gastritis 2010   GERD (gastroesophageal reflux disease)    GI bleed    Gout    HTN (hypertension)    Hyperlipidemia    Insomnia    MI (myocardial infarction) (HCC) 2009   no stent placement   OA (osteoarthritis)    Obesity    OSA on CPAP    Seasonal allergies    TIA (transient ischemic attack) 2013    Tobacco Use: Social History   Tobacco Use  Smoking Status Former   Current packs/day: 0.00   Average packs/day: 1.5 packs/day for 10.0 years (15.0 ttl pk-yrs)  Types: Cigarettes   Start date: 11/05/1981   Quit date: 11/06/1991   Years since quitting: 31.8  Smokeless Tobacco Never  Tobacco Comments   quit 1993    Labs: Review Flowsheet  More data exists      Latest Ref Rng & Units 01/24/2021 06/11/2022 06/16/2022 11/13/2022 02/18/2023  Labs for ITP Cardiac and Pulmonary Rehab  Cholestrol 100 - 199 mg/dL 766  750  - - 884   LDL (calc) 0 - 99 mg/dL 840  844  - - 50   HDL-C >39 mg/dL 30  30  - - 30   Trlycerides 0 - 149 mg/dL 762  660  - - 786   Hemoglobin A1c 0.0 - 7.0 % 6.2  6.2  - 6.4  -  TCO2 22 - 32 mmol/L - - 23  - -    Capillary Blood Glucose: Lab Results  Component Value Date   GLUCAP 115 (H) 08/14/2023   GLUCAP 99 09/05/2017   GLUCAP  99 11/27/2012   GLUCAP 88 11/27/2012   GLUCAP 92 09/09/2012     Exercise Target Goals: Exercise Program Goal: Individual exercise prescription set using results from initial 6 min walk test and THRR while considering  patient's activity barriers and safety.   Exercise Prescription Goal: Initial exercise prescription builds to 30-45 minutes a day of aerobic activity, 2-3 days per week.  Home exercise guidelines will be given to patient during program as part of exercise prescription that the participant will acknowledge.  Activity Barriers & Risk Stratification:  Activity Barriers & Cardiac Risk Stratification - 09/05/23 1358       Activity Barriers & Cardiac Risk Stratification   Activity Barriers Arthritis;Joint Problems    Cardiac Risk Stratification High   <5 METs on         6 Minute Walk:  6 Minute Walk     Row Name 09/05/23 1528         6 Minute Walk   Phase Initial     Distance 1140 feet     Walk Time 6 minutes     # of Rest Breaks 0     MPH 2.16     METS 1.55     RPE 14     Perceived Dyspnea  0     VO2 Peak 5.42     Symptoms Yes (comment)     Comments no pain, a little winded. resolved with rest     Resting HR 63 bpm     Resting BP 112/86     Resting Oxygen Saturation  97 %     Exercise Oxygen Saturation  during 6 min walk 97 %     Max Ex. HR 110 bpm     Max Ex. BP 142/82     2 Minute Post BP 122/86        Oxygen Initial Assessment:   Oxygen Re-Evaluation:   Oxygen Discharge (Final Oxygen Re-Evaluation):   Initial Exercise Prescription:  Initial Exercise Prescription - 09/05/23 1500       Date of Initial Exercise RX and Referring Provider   Date 09/05/23    Referring Provider Lonni Cash, MD    Expected Discharge Date 11/27/23      NuStep   Level 1    SPM 70    Minutes 15    METs 1.5      Track   Laps 13    Minutes 15    METs 1.5  Prescription Details   Frequency (times per week) 3    Duration Progress to 30  minutes of continuous aerobic without signs/symptoms of physical distress      Intensity   THRR 40-80% of Max Heartrate 61-122    Ratings of Perceived Exertion 11-13    Perceived Dyspnea 0-4      Progression   Progression Continue progressive overload as per policy without signs/symptoms or physical distress.      Resistance Training   Training Prescription Yes    Weight 3    Reps 10-15          Perform Capillary Blood Glucose checks as needed.  Exercise Prescription Changes:   Exercise Comments:   Exercise Goals and Review:   Exercise Goals     Row Name 09/05/23 1254             Exercise Goals   Increase Physical Activity Yes       Intervention Provide advice, education, support and counseling about physical activity/exercise needs.;Develop an individualized exercise prescription for aerobic and resistive training based on initial evaluation findings, risk stratification, comorbidities and participant's personal goals.       Expected Outcomes Short Term: Attend rehab on a regular basis to increase amount of physical activity.;Long Term: Exercising regularly at least 3-5 days a week.;Long Term: Add in home exercise to make exercise part of routine and to increase amount of physical activity.       Increase Strength and Stamina Yes       Intervention Develop an individualized exercise prescription for aerobic and resistive training based on initial evaluation findings, risk stratification, comorbidities and participant's personal goals.;Provide advice, education, support and counseling about physical activity/exercise needs.       Expected Outcomes Short Term: Increase workloads from initial exercise prescription for resistance, speed, and METs.;Short Term: Perform resistance training exercises routinely during rehab and add in resistance training at home;Long Term: Improve cardiorespiratory fitness, muscular endurance and strength as measured by increased METs and  functional capacity ( )       Able to understand and use rate of perceived exertion (RPE) scale Yes       Intervention Provide education and explanation on how to use RPE scale       Expected Outcomes Short Term: Able to use RPE daily in rehab to express subjective intensity level;Long Term:  Able to use RPE to guide intensity level when exercising independently       Knowledge and understanding of Target Heart Rate Range (THRR) Yes       Intervention Provide education and explanation of THRR including how the numbers were predicted and where they are located for reference       Expected Outcomes Short Term: Able to state/look up THRR;Short Term: Able to use daily as guideline for intensity in rehab;Long Term: Able to use THRR to govern intensity when exercising independently       Understanding of Exercise Prescription Yes       Intervention Provide education, explanation, and written materials on patient's individual exercise prescription       Expected Outcomes Long Term: Able to explain home exercise prescription to exercise independently;Short Term: Able to explain program exercise prescription          Exercise Goals Re-Evaluation :   Discharge Exercise Prescription (Final Exercise Prescription Changes):   Nutrition:  Target Goals: Understanding of nutrition guidelines, daily intake of sodium 1500mg , cholesterol 200mg , calories 30% from fat and 7% or less from  saturated fats, daily to have 5 or more servings of fruits and vegetables.  Biometrics:  Pre Biometrics - 09/05/23 1252       Pre Biometrics   Waist Circumference 59 inches    Hip Circumference 53 inches    Waist to Hip Ratio 1.11 %    Triceps Skinfold 48 mm    % Body Fat 50.8 %    Grip Strength 29 kg    Flexibility 9 in    Single Leg Stand 1 seconds           Nutrition Therapy Plan and Nutrition Goals:   Nutrition Assessments:  MEDIFICTS Score Key: >=70 Need to make dietary changes  40-70 Heart  Healthy Diet <= 40 Therapeutic Level Cholesterol Diet    Picture Your Plate Scores: <59 Unhealthy dietary pattern with much room for improvement. 41-50 Dietary pattern unlikely to meet recommendations for good health and room for improvement. 51-60 More healthful dietary pattern, with some room for improvement.  >60 Healthy dietary pattern, although there may be some specific behaviors that could be improved.    Nutrition Goals Re-Evaluation:   Nutrition Goals Re-Evaluation:   Nutrition Goals Discharge (Final Nutrition Goals Re-Evaluation):   Psychosocial: Target Goals: Acknowledge presence or absence of significant depression and/or stress, maximize coping skills, provide positive support system. Participant is able to verbalize types and ability to use techniques and skills needed for reducing stress and depression.  Initial Review & Psychosocial Screening:  Initial Psych Review & Screening - 09/05/23 1358       Initial Review   Current issues with None Identified      Family Dynamics   Good Support System? Yes   friends and family     Barriers   Psychosocial barriers to participate in program There are no identifiable barriers or psychosocial needs.      Screening Interventions   Interventions Provide feedback about the scores to participant;Encouraged to exercise    Expected Outcomes Long Term goal: The participant improves quality of Life and PHQ9 Scores as seen by post scores and/or verbalization of changes;Short Term goal: Identification and review with participant of any Quality of Life or Depression concerns found by scoring the questionnaire.          Quality of Life Scores:  Quality of Life - 09/05/23 1531       Quality of Life   Select Quality of Life      Quality of Life Scores   Health/Function Pre 22.47 %    Socioeconomic Pre 27.25 %    Psych/Spiritual Pre 26.07 %    Family Pre 24.7 %    GLOBAL Pre 24.6 %         Scores of 19 and below  usually indicate a poorer quality of life in these areas.  A difference of  2-3 points is a clinically meaningful difference.  A difference of 2-3 points in the total score of the Quality of Life Index has been associated with significant improvement in overall quality of life, self-image, physical symptoms, and general health in studies assessing change in quality of life.  PHQ-9: Review Flowsheet  More data exists      09/05/2023 02/18/2023 07/12/2022 07/10/2022 08/01/2020  Depression screen PHQ 2/9  Decreased Interest 0 0 2 0 0  Down, Depressed, Hopeless 0 0 0 0 0  PHQ - 2 Score 0 0 2 0 0  Altered sleeping 0 0 2 - 0  Tired, decreased energy 0 0 2 -  0  Change in appetite 0 0 2 - 0  Feeling bad or failure about yourself  0 0 2 - 0  Trouble concentrating 0 0 0 - 0  Moving slowly or fidgety/restless 0 0 0 - 0  Suicidal thoughts 0 0 0 - 0  PHQ-9 Score 0 0 10 - 0  Difficult doing work/chores Not difficult at all - - - -   Interpretation of Total Score  Total Score Depression Severity:  1-4 = Minimal depression, 5-9 = Mild depression, 10-14 = Moderate depression, 15-19 = Moderately severe depression, 20-27 = Severe depression   Psychosocial Evaluation and Intervention:   Psychosocial Re-Evaluation:   Psychosocial Discharge (Final Psychosocial Re-Evaluation):   Vocational Rehabilitation: Provide vocational rehab assistance to qualifying candidates.   Vocational Rehab Evaluation & Intervention:  Vocational Rehab - 09/05/23 1359       Initial Vocational Rehab Evaluation & Intervention   Assessment shows need for Vocational Rehabilitation No   retired         Education: Education Goals: Education classes will be provided on a weekly basis, covering required topics. Participant will state understanding/return demonstration of topics presented.     Core Videos: Exercise    Move It!  Clinical staff conducted group or individual video education with verbal and written material  and guidebook.  Patient learns the recommended Pritikin exercise program. Exercise with the goal of living a long, healthy life. Some of the health benefits of exercise include controlled diabetes, healthier blood pressure levels, improved cholesterol levels, improved heart and lung capacity, improved sleep, and better body composition. Everyone should speak with their doctor before starting or changing an exercise routine.  Biomechanical Limitations Clinical staff conducted group or individual video education with verbal and written material and guidebook.  Patient learns how biomechanical limitations can impact exercise and how we can mitigate and possibly overcome limitations to have an impactful and balanced exercise routine.  Body Composition Clinical staff conducted group or individual video education with verbal and written material and guidebook.  Patient learns that body composition (ratio of muscle mass to fat mass) is a key component to assessing overall fitness, rather than body weight alone. Increased fat mass, especially visceral belly fat, can put us  at increased risk for metabolic syndrome, type 2 diabetes, heart disease, and even death. It is recommended to combine diet and exercise (cardiovascular and resistance training) to improve your body composition. Seek guidance from your physician and exercise physiologist before implementing an exercise routine.  Exercise Action Plan Clinical staff conducted group or individual video education with verbal and written material and guidebook.  Patient learns the recommended strategies to achieve and enjoy long-term exercise adherence, including variety, self-motivation, self-efficacy, and positive decision making. Benefits of exercise include fitness, good health, weight management, more energy, better sleep, less stress, and overall well-being.  Medical   Heart Disease Risk Reduction Clinical staff conducted group or individual video  education with verbal and written material and guidebook.  Patient learns our heart is our most vital organ as it circulates oxygen, nutrients, white blood cells, and hormones throughout the entire body, and carries waste away. Data supports a plant-based eating plan like the Pritikin Program for its effectiveness in slowing progression of and reversing heart disease. The video provides a number of recommendations to address heart disease.   Metabolic Syndrome and Belly Fat  Clinical staff conducted group or individual video education with verbal and written material and guidebook.  Patient learns what metabolic syndrome is,  how it leads to heart disease, and how one can reverse it and keep it from coming back. You have metabolic syndrome if you have 3 of the following 5 criteria: abdominal obesity, high blood pressure, high triglycerides, low HDL cholesterol, and high blood sugar.  Hypertension and Heart Disease Clinical staff conducted group or individual video education with verbal and written material and guidebook.  Patient learns that high blood pressure, or hypertension, is very common in the [place] . Hypertension is largely due to excessive salt intake, but other important risk factors include being overweight, physical inactivity, drinking too much alcohol, smoking, and not eating enough potassium from fruits and vegetables. High blood pressure is a leading risk factor for heart attack, stroke, congestive heart failure, dementia, kidney failure, and premature death. Long-term effects of excessive salt intake include stiffening of the arteries and thickening of heart muscle and organ damage. Recommendations include ways to reduce hypertension and the risk of heart disease.  Diseases of Our Time - Focusing on Diabetes Clinical staff conducted group or individual video education with verbal and written material and guidebook.  Patient learns why the best way to stop diseases of our time is  prevention, through food and other lifestyle changes. Medicine (such as prescription pills and surgeries) is often only a Band-Aid on the problem, not a long-term solution. Most common diseases of our time include obesity, type 2 diabetes, hypertension, heart disease, and cancer. The Pritikin Program is recommended and has been proven to help reduce, reverse, and/or prevent the damaging effects of metabolic syndrome.  Nutrition   Overview of the Pritikin Eating Plan  Clinical staff conducted group or individual video education with verbal and written material and guidebook.  Patient learns about the Pritikin Eating Plan for disease risk reduction. The Pritikin Eating Plan emphasizes a wide variety of unrefined, minimally-processed carbohydrates, like fruits, vegetables, whole grains, and legumes. Go, Caution, and Stop food choices are explained. Plant-based and lean animal proteins are emphasized. Rationale provided for low sodium intake for blood pressure control, low added sugars for blood sugar stabilization, and low added fats and oils for coronary artery disease risk reduction and weight management.  Calorie Density  Clinical staff conducted group or individual video education with verbal and written material and guidebook.  Patient learns about calorie density and how it impacts the Pritikin Eating Plan. Knowing the characteristics of the food you choose will help you decide whether those foods will lead to weight gain or weight loss, and whether you want to consume more or less of them. Weight loss is usually a side effect of the Pritikin Eating Plan because of its focus on low calorie-dense foods.  Label Reading  Clinical staff conducted group or individual video education with verbal and written material and guidebook.  Patient learns about the Pritikin recommended label reading guidelines and corresponding recommendations regarding calorie density, added sugars, sodium content, and whole  grains.  Dining Out - Part 1  Clinical staff conducted group or individual video education with verbal and written material and guidebook.  Patient learns that restaurant meals can be sabotaging because they can be so high in calories, fat, sodium, and/or sugar. Patient learns recommended strategies on how to positively address this and avoid unhealthy pitfalls.  Facts on Fats  Clinical staff conducted group or individual video education with verbal and written material and guidebook.  Patient learns that lifestyle modifications can be just as effective, if not more so, as many medications for lowering your risk  of heart disease. A Pritikin lifestyle can help to reduce your risk of inflammation and atherosclerosis (cholesterol build-up, or plaque, in the artery walls). Lifestyle interventions such as dietary choices and physical activity address the cause of atherosclerosis. A review of the types of fats and their impact on blood cholesterol levels, along with dietary recommendations to reduce fat intake is also included.  Nutrition Action Plan  Clinical staff conducted group or individual video education with verbal and written material and guidebook.  Patient learns how to incorporate Pritikin recommendations into their lifestyle. Recommendations include planning and keeping personal health goals in mind as an important part of their success.  Healthy Mind-Set    Healthy Minds, Bodies, Hearts  Clinical staff conducted group or individual video education with verbal and written material and guidebook.  Patient learns how to identify when they are stressed. Video will discuss the impact of that stress, as well as the many benefits of stress management. Patient will also be introduced to stress management techniques. The way we think, act, and feel has an impact on our hearts.  How Our Thoughts Can Heal Our Hearts  Clinical staff conducted group or individual video education with verbal and  written material and guidebook.  Patient learns that negative thoughts can cause depression and anxiety. This can result in negative lifestyle behavior and serious health problems. Cognitive behavioral therapy is an effective method to help control our thoughts in order to change and improve our emotional outlook.  Additional Videos:  Exercise    Improving Performance  Clinical staff conducted group or individual video education with verbal and written material and guidebook.  Patient learns to use a non-linear approach by alternating intensity levels and lengths of time spent exercising to help burn more calories and lose more body fat. Cardiovascular exercise helps improve heart health, metabolism, hormonal balance, blood sugar control, and recovery from fatigue. Resistance training improves strength, endurance, balance, coordination, reaction time, metabolism, and muscle mass. Flexibility exercise improves circulation, posture, and balance. Seek guidance from your physician and exercise physiologist before implementing an exercise routine and learn your capabilities and proper form for all exercise.  Introduction to Yoga  Clinical staff conducted group or individual video education with verbal and written material and guidebook.  Patient learns about yoga, a discipline of the coming together of mind, breath, and body. The benefits of yoga include improved flexibility, improved range of motion, better posture and core strength, increased lung function, weight loss, and positive self-image. Yoga's heart health benefits include lowered blood pressure, healthier heart rate, decreased cholesterol and triglyceride levels, improved immune function, and reduced stress. Seek guidance from your physician and exercise physiologist before implementing an exercise routine and learn your capabilities and proper form for all exercise.  Medical   Aging: Enhancing Your Quality of Life  Clinical staff conducted  group or individual video education with verbal and written material and guidebook.  Patient learns key strategies and recommendations to stay in good physical health and enhance quality of life, such as prevention strategies, having an advocate, securing a Health Care Proxy and Power of Attorney, and keeping a list of medications and system for tracking them. It also discusses how to avoid risk for bone loss.  Biology of Weight Control  Clinical staff conducted group or individual video education with verbal and written material and guidebook.  Patient learns that weight gain occurs because we consume more calories than we burn (eating more, moving less). Even if your body weight is  normal, you may have higher ratios of fat compared to muscle mass. Too much body fat puts you at increased risk for cardiovascular disease, heart attack, stroke, type 2 diabetes, and obesity-related cancers. In addition to exercise, following the Pritikin Eating Plan can help reduce your risk.  Decoding Lab Results  Clinical staff conducted group or individual video education with verbal and written material and guidebook.  Patient learns that lab test reflects one measurement whose values change over time and are influenced by many factors, including medication, stress, sleep, exercise, food, hydration, pre-existing medical conditions, and more. It is recommended to use the knowledge from this video to become more involved with your lab results and evaluate your numbers to speak with your doctor.   Diseases of Our Time - Overview  Clinical staff conducted group or individual video education with verbal and written material and guidebook.  Patient learns that according to the CDC, 50% to 70% of chronic diseases (such as obesity, type 2 diabetes, elevated lipids, hypertension, and heart disease) are avoidable through lifestyle improvements including healthier food choices, listening to satiety cues, and increased physical  activity.  Sleep Disorders Clinical staff conducted group or individual video education with verbal and written material and guidebook.  Patient learns how good quality and duration of sleep are important to overall health and well-being. Patient also learns about sleep disorders and how they impact health along with recommendations to address them, including discussing with a physician.  Nutrition  Dining Out - Part 2 Clinical staff conducted group or individual video education with verbal and written material and guidebook.  Patient learns how to plan ahead and communicate in order to maximize their dining experience in a healthy and nutritious manner. Included are recommended food choices based on the type of restaurant the patient is visiting.   Fueling a Banker conducted group or individual video education with verbal and written material and guidebook.  There is a strong connection between our food choices and our health. Diseases like obesity and type 2 diabetes are very prevalent and are in large-part due to lifestyle choices. The Pritikin Eating Plan provides plenty of food and hunger-curbing satisfaction. It is easy to follow, affordable, and helps reduce health risks.  Menu Workshop  Clinical staff conducted group or individual video education with verbal and written material and guidebook.  Patient learns that restaurant meals can sabotage health goals because they are often packed with calories, fat, sodium, and sugar. Recommendations include strategies to plan ahead and to communicate with the manager, chef, or server to help order a healthier meal.  Planning Your Eating Strategy  Clinical staff conducted group or individual video education with verbal and written material and guidebook.  Patient learns about the Pritikin Eating Plan and its benefit of reducing the risk of disease. The Pritikin Eating Plan does not focus on calories. Instead, it emphasizes  high-quality, nutrient-rich foods. By knowing the characteristics of the foods, we choose, we can determine their calorie density and make informed decisions.  Targeting Your Nutrition Priorities  Clinical staff conducted group or individual video education with verbal and written material and guidebook.  Patient learns that lifestyle habits have a tremendous impact on disease risk and progression. This video provides eating and physical activity recommendations based on your personal health goals, such as reducing LDL cholesterol, losing weight, preventing or controlling type 2 diabetes, and reducing high blood pressure.  Vitamins and Minerals  Clinical staff conducted group or individual video  education with verbal and written material and guidebook.  Patient learns different ways to obtain key vitamins and minerals, including through a recommended healthy diet. It is important to discuss all supplements you take with your doctor.   Healthy Mind-Set    Smoking Cessation  Clinical staff conducted group or individual video education with verbal and written material and guidebook.  Patient learns that cigarette smoking and tobacco addiction pose a serious health risk which affects millions of people. Stopping smoking will significantly reduce the risk of heart disease, lung disease, and many forms of cancer. Recommended strategies for quitting are covered, including working with your doctor to develop a successful plan.  Culinary   Becoming a Set designer conducted group or individual video education with verbal and written material and guidebook.  Patient learns that cooking at home can be healthy, cost-effective, quick, and puts them in control. Keys to cooking healthy recipes will include looking at your recipe, assessing your equipment needs, planning ahead, making it simple, choosing cost-effective seasonal ingredients, and limiting the use of added fats, salts, and  sugars.  Cooking - Breakfast and Snacks  Clinical staff conducted group or individual video education with verbal and written material and guidebook.  Patient learns how important breakfast is to satiety and nutrition through the entire day. Recommendations include key foods to eat during breakfast to help stabilize blood sugar levels and to prevent overeating at meals later in the day. Planning ahead is also a key component.  Cooking - Educational psychologist conducted group or individual video education with verbal and written material and guidebook.  Patient learns eating strategies to improve overall health, including an approach to cook more at home. Recommendations include thinking of animal protein as a side on your plate rather than center stage and focusing instead on lower calorie dense options like vegetables, fruits, whole grains, and plant-based proteins, such as beans. Making sauces in large quantities to freeze for later and leaving the skin on your vegetables are also recommended to maximize your experience.  Cooking - Healthy Salads and Dressing Clinical staff conducted group or individual video education with verbal and written material and guidebook.  Patient learns that vegetables, fruits, whole grains, and legumes are the foundations of the Pritikin Eating Plan. Recommendations include how to incorporate each of these in flavorful and healthy salads, and how to create homemade salad dressings. Proper handling of ingredients is also covered. Cooking - Soups and [employer] - Soups and Desserts Clinical staff conducted group or individual video education with verbal and written material and guidebook.  Patient learns that Pritikin soups and desserts make for easy, nutritious, and delicious snacks and meal components that are low in sodium, fat, sugar, and calorie density, while high in vitamins, minerals, and filling fiber. Recommendations include simple and healthy  ideas for soups and desserts.   Overview     The Pritikin Solution Program Overview Clinical staff conducted group or individual video education with verbal and written material and guidebook.  Patient learns that the results of the Pritikin Program have been documented in more than 100 articles published in peer-reviewed journals, and the benefits include reducing risk factors for (and, in some cases, even reversing) high cholesterol, high blood pressure, type 2 diabetes, obesity, and more! An overview of the three key pillars of the Pritikin Program will be covered: eating well, doing regular exercise, and having a healthy mind-set.  WORKSHOPS  Exercise: Exercise Basics: Building  Your Action Plan Clinical staff led group instruction and group discussion with PowerPoint presentation and patient guidebook. To enhance the learning environment the use of posters, models and videos may be added. At the conclusion of this workshop, patients will comprehend the difference between physical activity and exercise, as well as the benefits of incorporating both, into their routine. Patients will understand the FITT (Frequency, Intensity, Time, and Type) principle and how to use it to build an exercise action plan. In addition, safety concerns and other considerations for exercise and cardiac rehab will be addressed by the presenter. The purpose of this lesson is to promote a comprehensive and effective weekly exercise routine in order to improve patients' overall level of fitness.   Managing Heart Disease: Your Path to a Healthier Heart Clinical staff led group instruction and group discussion with PowerPoint presentation and patient guidebook. To enhance the learning environment the use of posters, models and videos may be added.At the conclusion of this workshop, patients will understand the anatomy and physiology of the heart. Additionally, they will understand how Pritikin's three pillars impact the  risk factors, the progression, and the management of heart disease.  The purpose of this lesson is to provide a high-level overview of the heart, heart disease, and how the Pritikin lifestyle positively impacts risk factors.  Exercise Biomechanics Clinical staff led group instruction and group discussion with PowerPoint presentation and patient guidebook. To enhance the learning environment the use of posters, models and videos may be added. Patients will learn how the structural parts of their bodies function and how these functions impact their daily activities, movement, and exercise. Patients will learn how to promote a neutral spine, learn how to manage pain, and identify ways to improve their physical movement in order to promote healthy living. The purpose of this lesson is to expose patients to common physical limitations that impact physical activity. Participants will learn practical ways to adapt and manage aches and pains, and to minimize their effect on regular exercise. Patients will learn how to maintain good posture while sitting, walking, and lifting.  Balance Training and Fall Prevention  Clinical staff led group instruction and group discussion with PowerPoint presentation and patient guidebook. To enhance the learning environment the use of posters, models and videos may be added. At the conclusion of this workshop, patients will understand the importance of their sensorimotor skills (vision, proprioception, and the vestibular system) in maintaining their ability to balance as they age. Patients will apply a variety of balancing exercises that are appropriate for their current level of function. Patients will understand the common causes for poor balance, possible solutions to these problems, and ways to modify their physical environment in order to minimize their fall risk. The purpose of this lesson is to teach patients about the importance of maintaining balance as they age  and ways to minimize their risk of falling.  WORKSHOPS   Nutrition:  Fueling a Ship broker led group instruction and group discussion with PowerPoint presentation and patient guidebook. To enhance the learning environment the use of posters, models and videos may be added. Patients will review the foundational principles of the Pritikin Eating Plan and understand what constitutes a serving size in each of the food groups. Patients will also learn Pritikin-friendly foods that are better choices when away from home and review make-ahead meal and snack options. Calorie density will be reviewed and applied to three nutrition priorities: weight maintenance, weight loss, and weight gain. The purpose of  this lesson is to reinforce (in a group setting) the key concepts around what patients are recommended to eat and how to apply these guidelines when away from home by planning and selecting Pritikin-friendly options. Patients will understand how calorie density may be adjusted for different weight management goals.  Mindful Eating  Clinical staff led group instruction and group discussion with PowerPoint presentation and patient guidebook. To enhance the learning environment the use of posters, models and videos may be added. Patients will briefly review the concepts of the Pritikin Eating Plan and the importance of low-calorie dense foods. The concept of mindful eating will be introduced as well as the importance of paying attention to internal hunger signals. Triggers for non-hunger eating and techniques for dealing with triggers will be explored. The purpose of this lesson is to provide patients with the opportunity to review the basic principles of the Pritikin Eating Plan, discuss the value of eating mindfully and how to measure internal cues of hunger and fullness using the Hunger Scale. Patients will also discuss reasons for non-hunger eating and learn strategies to use for controlling  emotional eating.  Targeting Your Nutrition Priorities Clinical staff led group instruction and group discussion with PowerPoint presentation and patient guidebook. To enhance the learning environment the use of posters, models and videos may be added. Patients will learn how to determine their genetic susceptibility to disease by reviewing their family history. Patients will gain insight into the importance of diet as part of an overall healthy lifestyle in mitigating the impact of genetics and other environmental insults. The purpose of this lesson is to provide patients with the opportunity to assess their personal nutrition priorities by looking at their family history, their own health history and current risk factors. Patients will also be able to discuss ways of prioritizing and modifying the Pritikin Eating Plan for their highest risk areas  Menu  Clinical staff led group instruction and group discussion with PowerPoint presentation and patient guidebook. To enhance the learning environment the use of posters, models and videos may be added. Using menus brought in from [employer], or printed from [employer], patients will apply the Pritikin dining out guidelines that were presented in the [employer] video. Patients will also be able to practice these guidelines in a variety of provided scenarios. The purpose of this lesson is to provide patients with the opportunity to practice hands-on learning of the Pritikin Dining Out guidelines with actual menus and practice scenarios.  Label Reading Clinical staff led group instruction and group discussion with PowerPoint presentation and patient guidebook. To enhance the learning environment the use of posters, models and videos may be added. Patients will review and discuss the Pritikin label reading guidelines presented in Pritikin's Label Reading Educational series video. Using fool labels brought in from local grocery stores  and markets, patients will apply the label reading guidelines and determine if the packaged food meet the Pritikin guidelines. The purpose of this lesson is to provide patients with the opportunity to review, discuss, and practice hands-on learning of the Pritikin Label Reading guidelines with actual packaged food labels. Cooking School  Pritikin's [employer] are designed to teach patients ways to prepare quick, simple, and affordable recipes at home. The importance of nutrition's role in chronic disease risk reduction is reflected in its emphasis in the overall Pritikin program. By learning how to prepare essential core Pritikin Eating Plan recipes, patients will increase control over what they eat; be able to  customize the flavor of foods without the use of added salt, sugar, or fat; and improve the quality of the food they consume. By learning a set of core recipes which are easily assembled, quickly prepared, and affordable, patients are more likely to prepare more healthy foods at home. These workshops focus on convenient breakfasts, simple entres, side dishes, and desserts which can be prepared with minimal effort and are consistent with nutrition recommendations for cardiovascular risk reduction. Cooking [employer] are taught by a Armed forces logistics/support/administrative officer (RD) who has been trained by the [employer]. The chef or RD has a clear understanding of the importance of minimizing - if not completely eliminating - added fat, sugar, and sodium in recipes. Throughout the series of Cooking School Workshop sessions, patients will learn about healthy ingredients and efficient methods of cooking to build confidence in their capability to prepare    Cooking School weekly topics:  Adding Flavor- Sodium-Free  Fast and Healthy Breakfasts  Powerhouse Plant-Based Proteins  Satisfying Salads and Dressings  Simple Sides and Sauces  International Cuisine-Spotlight on the [employer]  Zones  Delicious Desserts  Savory Soups  [employer] - Meals in a Astronomer Appetizers and Snacks  Comforting Weekend Breakfasts  One-Pot Wonders   Fast Evening Meals  Landscape architect Your Pritikin Plate  WORKSHOPS   Healthy Mindset (Psychosocial):  Focused Goals, Sustainable Changes Clinical staff led group instruction and group discussion with PowerPoint presentation and patient guidebook. To enhance the learning environment the use of posters, models and videos may be added. Patients will be able to apply effective goal setting strategies to establish at least one personal goal, and then take consistent, meaningful action toward that goal. They will learn to identify common barriers to achieving personal goals and develop strategies to overcome them. Patients will also gain an understanding of how our mind-set can impact our ability to achieve goals and the importance of cultivating a positive and growth-oriented mind-set. The purpose of this lesson is to provide patients with a deeper understanding of how to set and achieve personal goals, as well as the tools and strategies needed to overcome common obstacles which may arise along the way.  From Head to Heart: The Power of a Healthy Outlook  Clinical staff led group instruction and group discussion with PowerPoint presentation and patient guidebook. To enhance the learning environment the use of posters, models and videos may be added. Patients will be able to recognize and describe the impact of emotions and mood on physical health. They will discover the importance of self-care and explore self-care practices which may work for them. Patients will also learn how to utilize the 4 C's to cultivate a healthier outlook and better manage stress and challenges. The purpose of this lesson is to demonstrate to patients how a healthy outlook is an essential part of maintaining good health, especially as they continue their  cardiac rehab journey.  Healthy Sleep for a Healthy Heart Clinical staff led group instruction and group discussion with PowerPoint presentation and patient guidebook. To enhance the learning environment the use of posters, models and videos may be added. At the conclusion of this workshop, patients will be able to demonstrate knowledge of the importance of sleep to overall health, well-being, and quality of life. They will understand the symptoms of, and treatments for, common sleep disorders. Patients will also be able to identify daytime and nighttime behaviors which impact sleep, and they will be able to  apply these tools to help manage sleep-related challenges. The purpose of this lesson is to provide patients with a general overview of sleep and outline the importance of quality sleep. Patients will learn about a few of the most common sleep disorders. Patients will also be introduced to the concept of "sleep hygiene," and discover ways to self-manage certain sleeping problems through simple daily behavior changes. Finally, the workshop will motivate patients by clarifying the links between quality sleep and their goals of heart-healthy living.   Recognizing and Reducing Stress Clinical staff led group instruction and group discussion with PowerPoint presentation and patient guidebook. To enhance the learning environment the use of posters, models and videos may be added. At the conclusion of this workshop, patients will be able to understand the types of stress reactions, differentiate between acute and chronic stress, and recognize the impact that chronic stress has on their health. They will also be able to apply different coping mechanisms, such as reframing negative self-talk. Patients will have the opportunity to practice a variety of stress management techniques, such as deep abdominal breathing, progressive muscle relaxation, and/or guided imagery.  The purpose of this lesson is to educate  patients on the role of stress in their lives and to provide healthy techniques for coping with it.  Learning Barriers/Preferences:  Learning Barriers/Preferences - 09/05/23 1400       Learning Barriers/Preferences   Learning Barriers Sight   glasses   Learning Preferences Audio;Computer/Internet;Skilled Demonstration;Group Instruction;Individual Instruction;Pictoral;Verbal Instruction;Video;Written Material          Education Topics:  Knowledge Questionnaire Score:  Knowledge Questionnaire Score - 09/05/23 1411       Knowledge Questionnaire Score   Pre Score 18/24          Core Components/Risk Factors/Patient Goals at Admission:  Personal Goals and Risk Factors at Admission - 09/05/23 1359       Core Components/Risk Factors/Patient Goals on Admission    Weight Management Yes;Obesity;Weight Loss    Intervention Weight Management: Develop a combined nutrition and exercise program designed to reach desired caloric intake, while maintaining appropriate intake of nutrient and fiber, sodium and fats, and appropriate energy expenditure required for the weight goal.;Weight Management: Provide education and appropriate resources to help participant work on and attain dietary goals.;Weight Management/Obesity: Establish reasonable short term and long term weight goals.;Obesity: Provide education and appropriate resources to help participant work on and attain dietary goals.    Admit Weight 326 lb 8 oz (148.1 kg)    Goal Weight: Long Term 226 lb (102.5 kg)   pt goal   Diabetes Yes    Intervention Provide education about signs/symptoms and action to take for hypo/hyperglycemia.;Provide education about proper nutrition, including hydration, and aerobic/resistive exercise prescription along with prescribed medications to achieve blood glucose in normal ranges: Fasting glucose 65-99 mg/dL    Expected Outcomes Short Term: Participant verbalizes understanding of the signs/symptoms and immediate  care of hyper/hypoglycemia, proper foot care and importance of medication, aerobic/resistive exercise and nutrition plan for blood glucose control.;Long Term: Attainment of HbA1C < 7%.    Heart Failure Yes    Intervention Provide a combined exercise and nutrition program that is supplemented with education, support and counseling about heart failure. Directed toward relieving symptoms such as shortness of breath, decreased exercise tolerance, and extremity edema.    Expected Outcomes Improve functional capacity of life;Short term: Attendance in program 2-3 days a week with increased exercise capacity. Reported lower sodium intake. Reported increased fruit and vegetable intake.  Reports medication compliance.;Short term: Daily weights obtained and reported for increase. Utilizing diuretic protocols set by physician.;Long term: Adoption of self-care skills and reduction of barriers for early signs and symptoms recognition and intervention leading to self-care maintenance.    Hypertension Yes    Intervention Provide education on lifestyle modifcations including regular physical activity/exercise, weight management, moderate sodium restriction and increased consumption of fresh fruit, vegetables, and low fat dairy, alcohol moderation, and smoking cessation.;Monitor prescription use compliance.    Expected Outcomes Long Term: Maintenance of blood pressure at goal levels.;Short Term: Continued assessment and intervention until BP is < 140/36mm HG in hypertensive participants. < 130/33mm HG in hypertensive participants with diabetes, heart failure or chronic kidney disease.    Lipids Yes    Intervention Provide education and support for participant on nutrition & aerobic/resistive exercise along with prescribed medications to achieve LDL 70mg , HDL >40mg .    Expected Outcomes Short Term: Participant states understanding of desired cholesterol values and is compliant with medications prescribed. Participant is  following exercise prescription and nutrition guidelines.;Long Term: Cholesterol controlled with medications as prescribed, with individualized exercise RX and with personalized nutrition plan. Value goals: LDL < 70mg , HDL > 40 mg.          Core Components/Risk Factors/Patient Goals Review:    Core Components/Risk Factors/Patient Goals at Discharge (Final Review):    ITP Comments:  ITP Comments     Row Name 09/05/23 1253           ITP Comments Dr. Wilbert Bihari medical director. Introduction to [facility] education/intensive cardiac rehab. Initial orientation packet reviewed with patient.          Comments: Participant attended orientation for the cardiac rehabilitation program on  09/09/2023  to perform initial intake and exercise walk test. Patient introduced to the [facility] education and orientation packet was reviewed. Completed 6-minute walk test, measurements, initial ITP, and exercise prescription. Vital signs stable. Telemetry-normal sinus rhythm, asymptomatic.   Service time was from 1258 to 1503.  Con KATHEE Pereyra, MS, ACSM-CEP 09/09/2023 8:44 AM  .

## 2023-09-14 ENCOUNTER — Other Ambulatory Visit (HOSPITAL_COMMUNITY): Payer: Self-pay

## 2023-09-16 ENCOUNTER — Encounter (HOSPITAL_COMMUNITY)
Admission: RE | Admit: 2023-09-16 | Discharge: 2023-09-16 | Disposition: A | Discharge status: Home / Self Care | Payer: Medicare (Managed Care) | Source: Ambulatory Visit | Attending: Cardiovascular Disease | Admitting: Cardiovascular Disease

## 2023-09-16 ENCOUNTER — Encounter (HOSPITAL_COMMUNITY): Payer: Medicare (Managed Care)

## 2023-09-16 DIAGNOSIS — Z955 Presence of coronary angioplasty implant and graft: Secondary | ICD-10-CM | POA: Insufficient documentation

## 2023-09-16 DIAGNOSIS — Z48812 Encounter for surgical aftercare following surgery on the circulatory system: Secondary | ICD-10-CM | POA: Diagnosis not present

## 2023-09-16 DIAGNOSIS — I252 Old myocardial infarction: Secondary | ICD-10-CM | POA: Insufficient documentation

## 2023-09-16 DIAGNOSIS — I5032 Chronic diastolic (congestive) heart failure: Secondary | ICD-10-CM | POA: Insufficient documentation

## 2023-09-16 NOTE — Progress Notes (Signed)
 Daily Session Note  Patient Details  Name: Alex [name] MRN: [id number] Date of Birth: April 11, 1955 Referring Provider:   Flowsheet Row CARDIAC REHAB PHASE II ORIENTATION from 09/05/2023 in [facility]  Referring Provider Lonni Cash, MD    Encounter Date: 09/16/2023  Check In:  Session Check In - 09/16/23 0820       Check-In   Supervising physician immediately available to respond to emergencies [facility] MD immediately available    Physician(s) Damien Braver, NP    Location [facility]    Staff Present Con Pereyra, MS, Exercise Physiologist;Johnny Fayette, MS, Exercise Physiologist;Olinty Valere, MS, ACSM-CEP, Exercise Physiologist;Jetta Vannie BS, ACSM-CEP, Exercise Physiologist;David Makemson, MS, ACSM-CEP, CCRP, Exercise Physiologist;Maria Whitaker, RN, BSN    Virtual Visit No    Medication changes reported     No    Fall or balance concerns reported    No    Tobacco Cessation No Change    Warm-up and Cool-down Performed as group-led instruction    Resistance Training Performed Yes    VAD Patient? No    PAD/SET Patient? No      Pain Assessment   Currently in Pain? No/denies    Pain Score 0-No pain    Multiple Pain Sites No          Capillary Blood Glucose: No results found for this or any previous visit (from the past 24 hours).   Exercise Prescription Changes - 09/16/23 1600       Response to Exercise   Blood Pressure (Admit) 130/80    Blood Pressure (Exercise) 160/88    Blood Pressure (Exit) 126/80    Heart Rate (Admit) 77 bpm    Heart Rate (Exercise) 147 bpm    Heart Rate (Exit) 86 bpm    Rating of Perceived Exertion (Exercise) 12    Symptoms 4/10 bilateral knee apin    Comments Pt's first day in the [facility] program    Duration Progress to 30 minutes of  aerobic without signs/symptoms of physical distress    Intensity THRR unchanged      Progression   Progression Continue to  progress workloads to maintain intensity without signs/symptoms of physical distress.    Average METs 2.3      Resistance Training   Training Prescription Yes    Weight 3    Reps 10-15    Time 5 Minutes      Interval Training   Interval Training No      NuStep   Level 1    Minutes 10    METs 1.8      Track   Laps 14    Minutes 15    METs 2.79          Social History   Tobacco Use  Smoking Status Former   Current packs/day: 0.00   Average packs/day: 1.5 packs/day for 10.0 years (15.0 ttl pk-yrs)   Types: Cigarettes   Start date: 11/05/1981   Quit date: 11/06/1991   Years since quitting: 31.8  Smokeless Tobacco Never  Tobacco Comments   quit 1993    Goals Met:  Pt stopped at 25 minutes of exercise c/o bilateral knee pain 4/10 (15 minutes of walking exercise tolerated but only 10 minutes of exercise on recumbent stepper tolerated).  Goals Unmet:  Not Applicable  Comments: Pt started cardiac rehab today.  Pt tolerated light exercise without difficulty. VSS, telemetry Normal Sinus Rhythm with occasional PVCs while  at rest, tachycardic when exercising, asymptomatic.  Medication list reconciled. Pt denies barriers to medication compliance.  PSYCHOSOCIAL ASSESSMENT:  PHQ-0. Pt exhibits positive coping skills, hopeful outlook with supportive family. No psychosocial needs identified at this time, no psychosocial interventions necessary.    Pt enjoys family, church, playing musical instruments.   Pt oriented to exercise equipment and routine.    Understanding verbalized.     Dr. Wilbert Bihari is Medical Director for Cardiac Rehab at [facility].

## 2023-09-18 ENCOUNTER — Encounter (HOSPITAL_COMMUNITY): Payer: Medicare (Managed Care)

## 2023-09-18 ENCOUNTER — Encounter (HOSPITAL_COMMUNITY)
Admission: RE | Admit: 2023-09-18 | Discharge: 2023-09-18 | Disposition: A | Discharge status: Home / Self Care | Payer: Medicare (Managed Care) | Source: Ambulatory Visit | Attending: Cardiovascular Disease | Admitting: Cardiovascular Disease

## 2023-09-18 DIAGNOSIS — Z955 Presence of coronary angioplasty implant and graft: Secondary | ICD-10-CM | POA: Diagnosis not present

## 2023-09-20 ENCOUNTER — Encounter (HOSPITAL_COMMUNITY): Payer: Medicare (Managed Care)

## 2023-09-20 ENCOUNTER — Telehealth (HOSPITAL_COMMUNITY): Payer: Self-pay

## 2023-09-20 ENCOUNTER — Encounter (HOSPITAL_COMMUNITY): Admission: RE | Admit: 2023-09-20 | Payer: Medicare (Managed Care) | Source: Ambulatory Visit

## 2023-09-20 NOTE — Telephone Encounter (Signed)
 Patient c/o for 8:15 CR class, stated he does not feel well. Asked patient if he had flu or other symptoms, he stated he thinks it was something he ate and does not have flu symptoms.

## 2023-09-23 ENCOUNTER — Encounter (HOSPITAL_COMMUNITY): Admission: RE | Admit: 2023-09-23 | Payer: Medicare (Managed Care) | Source: Ambulatory Visit

## 2023-09-23 ENCOUNTER — Telehealth (HOSPITAL_COMMUNITY): Payer: Self-pay

## 2023-09-23 ENCOUNTER — Telehealth: Payer: Self-pay | Admitting: Cardiovascular Disease

## 2023-09-23 ENCOUNTER — Encounter (HOSPITAL_COMMUNITY): Payer: Medicare (Managed Care)

## 2023-09-23 NOTE — Telephone Encounter (Signed)
 Spoke with pt over the phone and he stated that he had some mild chest discomfort on Saturday after working out (pt stated he was trying to take it easy and was walking around the track). Pt denies any more CP episodes after that. Pt wanted to discuss with Dr. Verlin to make sure he was okay to return to cardiac rehab on Wednesday of this week. Explained to pt that I will forward this to Dr. Verlin and his nurse to review. Pt verbalized understanding of plan and had no further issues or concerns at this time.

## 2023-09-23 NOTE — Telephone Encounter (Signed)
 Patient left message calling out for 8:15 CR class, stated he is still sick and is going to see his doctor as he feels he needs to be checked out. He stated he will not be in for classes until he has gotten an appt with his doctor.

## 2023-09-23 NOTE — Telephone Encounter (Signed)
 Pt would like a c/b regarding whether he needs to f/u with Dr. Verlin before returning to Cardiac Rehab on Wednesday. Reason being is that pt stated that he did have a little CP on this past Saturday. Please advise

## 2023-09-23 NOTE — Telephone Encounter (Signed)
 Spoke with pt and explained that Dr. Verlin and stated that with him having the mild CP but it resolving and not having issues since, he should be okay to return to cardiac rehab. Pt stated he has been feeling like he has no energy and she feels blah. Asked pt to check his BP while on the phone and it was 136/78. Advised pt to call PCP and discuss if he needs to come in for symptoms. Pt verbalized understanding and agreed with plan.

## 2023-09-23 NOTE — Progress Notes (Signed)
 Cardiac Individual Treatment Plan  Patient Details  Name: Alex [name] MRN: [id number] Date of Birth: 09-Mar-1956 Referring Provider:   Flowsheet Row CARDIAC REHAB PHASE II ORIENTATION from 09/05/2023 in [facility]  Referring Provider Lonni Cash, MD    Initial Encounter Date:  Flowsheet Row CARDIAC REHAB PHASE II ORIENTATION from 09/05/2023 in [facility]  Date 09/05/23    Visit Diagnosis: 08/14/23 DES LAD  Patient's Home Medications on Admission:  Current Outpatient Medications:    acetaminophen  (TYLENOL ) 500 MG tablet, Take 500 mg by mouth every 6 (six) hours as needed for moderate pain., Disp: , Rfl:    allopurinol  (ZYLOPRIM ) 100 MG tablet, Take 1 tablet (100 mg total) by mouth daily as needed (gout)., Disp: , Rfl:    amLODipine  (NORVASC ) 10 MG tablet, TAKE 1 TABLET BY MOUTH ONCE DAILY WITH SUPPER, Disp: 90 tablet, Rfl: 0   aspirin  81 MG chewable tablet, Chew 1 tablet (81 mg total) by mouth daily., Disp: 30 tablet, Rfl: 2   colchicine  0.6 MG tablet, Take 1 tablet (0.6 mg total) by mouth daily as needed (gout)., Disp: , Rfl:    Evolocumab  (REPATHA  SURECLICK) 140 MG/ML SOAJ, INJECT 140 MG INTO THE SKIN EVERY 14 DAYS, Disp: 2 mL, Rfl: 11   glucose blood test strip, 1 each by Other route daily. Use as instructed (Patient not taking: Reported on 09/05/2023), Disp: 100 each, Rfl: 12   irbesartan  (AVAPRO ) 150 MG tablet, Take 1 tablet (150 mg total) by mouth daily. Please make overdue appt with Dr. Cash before anymore refills  Thank you 3rd and Final attempt, Disp: 15 tablet, Rfl: 0   metoprolol  succinate (TOPROL -XL) 25 MG 24 hr tablet, Take 1 tablet (25 mg total) by mouth daily., Disp: 30 tablet, Rfl: 11   naproxen  (NAPROSYN ) 500 MG tablet, TAKE 1 TABLET BY MOUTH ONCE DAILY AS NEEDED FOR MODERATE PAIN, Disp: 30 tablet, Rfl: 0   nitroGLYCERIN  (NITROSTAT ) 0.4 MG SL  tablet, Place 1 tablet (0.4 mg total) under the tongue every 5 (five) minutes as needed for chest pain., Disp: 25 tablet, Rfl: 3   omeprazole  (PRILOSEC) 40 MG capsule, TAKE 1 CAPSULE BY MOUTH ONCE DAILY WITH SUPPER, Disp: 90 capsule, Rfl: 0   prasugrel  (EFFIENT ) 10 MG TABS tablet, Take 1 tablet (10 mg total) by mouth daily., Disp: 90 tablet, Rfl: 3   triamcinolone  ointment (KENALOG ) 0.5 %, APPLY OINTMENT TOPICALLY TO AFFECTED AREA AS NEEDED, Disp: 15 g, Rfl: 2  Past Medical History: Past Medical History:  Diagnosis Date   Asthma    CAD (coronary artery disease)    Cardiac cath 2002 with 25% distal RCA stenosis.    DDD (degenerative disc disease), lumbar    Diabetes mellitus (HCC)    Diverticulitis    Esophageal stricture    Gastritis 2010   GERD (gastroesophageal reflux disease)    GI bleed    Gout    HTN (hypertension)    Hyperlipidemia    Insomnia    MI (myocardial infarction) (HCC) 2009   no stent placement   OA (osteoarthritis)    Obesity    OSA on CPAP    Seasonal allergies    TIA (transient ischemic attack) 2013    Tobacco Use: Social History   Tobacco Use  Smoking Status Former   Current packs/day: 0.00   Average packs/day: 1.5 packs/day for 10.0 years (15.0 ttl pk-yrs)  Types: Cigarettes   Start date: 11/05/1981   Quit date: 11/06/1991   Years since quitting: 31.9  Smokeless Tobacco Never  Tobacco Comments   quit 1993    Labs: Review Flowsheet  More data exists      Latest Ref Rng & Units 01/24/2021 06/11/2022 06/16/2022 11/13/2022 02/18/2023  Labs for ITP Cardiac and Pulmonary Rehab  Cholestrol 100 - 199 mg/dL 766  750  - - 884   LDL (calc) 0 - 99 mg/dL 840  844  - - 50   HDL-C >39 mg/dL 30  30  - - 30   Trlycerides 0 - 149 mg/dL 762  660  - - 786   Hemoglobin A1c 0.0 - 7.0 % 6.2  6.2  - 6.4  -  TCO2 22 - 32 mmol/L - - 23  - -    Capillary Blood Glucose: Lab Results  Component Value Date   GLUCAP 115 (H) 08/14/2023   GLUCAP 99 09/05/2017   GLUCAP 99  11/27/2012   GLUCAP 88 11/27/2012   GLUCAP 92 09/09/2012     Exercise Target Goals: Exercise Program Goal: Individual exercise prescription set using results from initial 6 min walk test and THRR while considering  patient's activity barriers and safety.   Exercise Prescription Goal: Initial exercise prescription builds to 30-45 minutes a day of aerobic activity, 2-3 days per week.  Home exercise guidelines will be given to patient during program as part of exercise prescription that the participant will acknowledge.  Activity Barriers & Risk Stratification:  Activity Barriers & Cardiac Risk Stratification - 09/05/23 1358       Activity Barriers & Cardiac Risk Stratification   Activity Barriers Arthritis;Joint Problems    Cardiac Risk Stratification High   <5 METs on         6 Minute Walk:  6 Minute Walk     Row Name 09/05/23 1528         6 Minute Walk   Phase Initial     Distance 1140 feet     Walk Time 6 minutes     # of Rest Breaks 0     MPH 2.16     METS 1.55     RPE 14     Perceived Dyspnea  0     VO2 Peak 5.42     Symptoms Yes (comment)     Comments no pain, a little winded. resolved with rest     Resting HR 63 bpm     Resting BP 112/86     Resting Oxygen Saturation  97 %     Exercise Oxygen Saturation  during 6 min walk 97 %     Max Ex. HR 110 bpm     Max Ex. BP 142/82     2 Minute Post BP 122/86        Oxygen Initial Assessment:   Oxygen Re-Evaluation:   Oxygen Discharge (Final Oxygen Re-Evaluation):   Initial Exercise Prescription:  Initial Exercise Prescription - 09/05/23 1500       Date of Initial Exercise RX and Referring Provider   Date 09/05/23    Referring Provider Lonni Cash, MD    Expected Discharge Date 11/27/23      NuStep   Level 1    SPM 70    Minutes 15    METs 1.5      Track   Laps 13    Minutes 15    METs 1.5  Prescription Details   Frequency (times per week) 3    Duration Progress to 30  minutes of continuous aerobic without signs/symptoms of physical distress      Intensity   THRR 40-80% of Max Heartrate 61-122    Ratings of Perceived Exertion 11-13    Perceived Dyspnea 0-4      Progression   Progression Continue progressive overload as per policy without signs/symptoms or physical distress.      Resistance Training   Training Prescription Yes    Weight 3    Reps 10-15          Perform Capillary Blood Glucose checks as needed.  Exercise Prescription Changes:   Exercise Prescription Changes     Row Name 09/16/23 1600             Response to Exercise   Blood Pressure (Admit) 130/80       Blood Pressure (Exercise) 160/88       Blood Pressure (Exit) 126/80       Heart Rate (Admit) 77 bpm       Heart Rate (Exercise) 147 bpm       Heart Rate (Exit) 86 bpm       Rating of Perceived Exertion (Exercise) 12       Symptoms 4/10 bilateral knee apin       Comments Pt's first day in the CRP2 program       Duration Progress to 30 minutes of  aerobic without signs/symptoms of physical distress       Intensity THRR unchanged         Progression   Progression Continue to progress workloads to maintain intensity without signs/symptoms of physical distress.       Average METs 2.3         Resistance Training   Training Prescription Yes       Weight 3       Reps 10-15       Time 5 Minutes         Interval Training   Interval Training No         NuStep   Level 1       Minutes 10       METs 1.8         Track   Laps 14       Minutes 15       METs 2.79          Exercise Comments:   Exercise Comments     Row Name 09/16/23 1643           Exercise Comments Pt's first day in the CRP2 program. Pt had c/o of bilateral knee pain on nustep. Will continue to monitor for pain on this modality.          Exercise Goals and Review:   Exercise Goals     Row Name 09/05/23 1254             Exercise Goals   Increase Physical Activity Yes        Intervention Provide advice, education, support and counseling about physical activity/exercise needs.;Develop an individualized exercise prescription for aerobic and resistive training based on initial evaluation findings, risk stratification, comorbidities and participant's personal goals.       Expected Outcomes Short Term: Attend rehab on a regular basis to increase amount of physical activity.;Long Term: Exercising regularly at least 3-5 days a week.;Long Term: Add in home exercise to make exercise part of routine  and to increase amount of physical activity.       Increase Strength and Stamina Yes       Intervention Develop an individualized exercise prescription for aerobic and resistive training based on initial evaluation findings, risk stratification, comorbidities and participant's personal goals.;Provide advice, education, support and counseling about physical activity/exercise needs.       Expected Outcomes Short Term: Increase workloads from initial exercise prescription for resistance, speed, and METs.;Short Term: Perform resistance training exercises routinely during rehab and add in resistance training at home;Long Term: Improve cardiorespiratory fitness, muscular endurance and strength as measured by increased METs and functional capacity ( )       Able to understand and use rate of perceived exertion (RPE) scale Yes       Intervention Provide education and explanation on how to use RPE scale       Expected Outcomes Short Term: Able to use RPE daily in rehab to express subjective intensity level;Long Term:  Able to use RPE to guide intensity level when exercising independently       Knowledge and understanding of Target Heart Rate Range (THRR) Yes       Intervention Provide education and explanation of THRR including how the numbers were predicted and where they are located for reference       Expected Outcomes Short Term: Able to state/look up THRR;Short Term: Able to use daily as  guideline for intensity in rehab;Long Term: Able to use THRR to govern intensity when exercising independently       Understanding of Exercise Prescription Yes       Intervention Provide education, explanation, and written materials on patient's individual exercise prescription       Expected Outcomes Long Term: Able to explain home exercise prescription to exercise independently;Short Term: Able to explain program exercise prescription          Exercise Goals Re-Evaluation :  Exercise Goals Re-Evaluation     Row Name 09/16/23 1642             Exercise Goal Re-Evaluation   Exercise Goals Review Increase Physical Activity;Increase Strength and Stamina;Able to understand and use rate of perceived exertion (RPE) scale;Knowledge and understanding of Target Heart Rate Range (THRR);Understanding of Exercise Prescription       Comments Pt's first day in the CRP2 program. Pt understands the exercise Rx, RPE sclae and THRR.       Expected Outcomes Will continue to monitor and progress exercise workloads as tolerated.          Discharge Exercise Prescription (Final Exercise Prescription Changes):  Exercise Prescription Changes - 09/16/23 1600       Response to Exercise   Blood Pressure (Admit) 130/80    Blood Pressure (Exercise) 160/88    Blood Pressure (Exit) 126/80    Heart Rate (Admit) 77 bpm    Heart Rate (Exercise) 147 bpm    Heart Rate (Exit) 86 bpm    Rating of Perceived Exertion (Exercise) 12    Symptoms 4/10 bilateral knee apin    Comments Pt's first day in the CRP2 program    Duration Progress to 30 minutes of  aerobic without signs/symptoms of physical distress    Intensity THRR unchanged      Progression   Progression Continue to progress workloads to maintain intensity without signs/symptoms of physical distress.    Average METs 2.3      Resistance Training   Training Prescription Yes    Weight 3  Reps 10-15    Time 5 Minutes      Interval Training   Interval  Training No      NuStep   Level 1    Minutes 10    METs 1.8      Track   Laps 14    Minutes 15    METs 2.79          Nutrition:  Target Goals: Understanding of nutrition guidelines, daily intake of sodium 1500mg , cholesterol 200mg , calories 30% from fat and 7% or less from saturated fats, daily to have 5 or more servings of fruits and vegetables.  Biometrics:  Pre Biometrics - 09/05/23 1252       Pre Biometrics   Waist Circumference 59 inches    Hip Circumference 53 inches    Waist to Hip Ratio 1.11 %    Triceps Skinfold 48 mm    % Body Fat 50.8 %    Grip Strength 29 kg    Flexibility 9 in    Single Leg Stand 1 seconds           Nutrition Therapy Plan and Nutrition Goals:  Nutrition Therapy & Goals - 09/16/23 0911       Nutrition Therapy   Diet Heart Healthy Diet      Personal Nutrition Goals   Nutrition Goal Patient to identify strategies for reducing cardiovascular risk by attending the Pritikin education and nutrition series weekly.    Personal Goal #2 Patient to improve diet quality by using the plate method as a guide for meal planning to include lean protein/plant protein, fruits, vegetables, whole grains, nonfat dairy as part of a well-balanced diet.    Personal Goal #3 Patient to identify strategies for weight loss with goal of 0.5-2.0# per week.    Comments Patient has medical history DES LAD, chronic HFpEF, HTN, hyperlipidemia. He is currently taking repatha  due to statin intolerance; LDL is well controlled. A1c is in a pre-diabetic range. Will continue to discuss strategies for weight loss including calorie density, the plate method as a guide for meal planning, label reading, etc. Patient will benefit from participation in intensive cardiac rehab for nutrition education, exercise, and lifestyle modification.      Intervention Plan   Intervention Prescribe, educate and counsel regarding individualized specific dietary modifications aiming towards  targeted core components such as weight, hypertension, lipid management, diabetes, heart failure and other comorbidities.;Nutrition handout(s) given to patient.    Expected Outcomes Short Term Goal: Understand basic principles of dietary content, such as calories, fat, sodium, cholesterol and nutrients.;Long Term Goal: Adherence to prescribed nutrition plan.          Nutrition Assessments:  Nutrition Assessments - 09/16/23 1046       Rate Your Plate Scores   Pre Score 39         MEDIFICTS Score Key: >=70 Need to make dietary changes  40-70 Heart Healthy Diet <= 40 Therapeutic Level Cholesterol Diet   Flowsheet Row CARDIAC REHAB PHASE II EXERCISE from 09/16/2023 in [facility]  Picture Your Plate Total Score on Admission 39   Picture Your Plate Scores: <59 Unhealthy dietary pattern with much room for improvement. 41-50 Dietary pattern unlikely to meet recommendations for good health and room for improvement. 51-60 More healthful dietary pattern, with some room for improvement.  >60 Healthy dietary pattern, although there may be some specific behaviors that could be improved.    Nutrition Goals  Re-Evaluation:  Nutrition Goals Re-Evaluation     Row Name 09/16/23 0911             Goals   Current Weight 326 lb 8 oz (148.1 kg)       Comment triglycerides 213, HDL 30, LDL 50, A1c 6.4       Expected Outcome Patient has medical history DES LAD, chronic HFpEF, HTN, hyperlipidemia. He is currently taking repatha  due to statin intolerance;  LDL is well controlled. A1c is in a pre-diabetic range. Will continue to discuss strategies for weight loss including calorie density, the plate method as a guide for meal planning, label reading, etc. Patient will benefit from participation in intensive cardiac rehab for nutrition education, exercise, and lifestyle modification.          Nutrition Goals Re-Evaluation:  Nutrition Goals  Re-Evaluation     Row Name 09/16/23 0911             Goals   Current Weight 326 lb 8 oz (148.1 kg)       Comment triglycerides 213, HDL 30, LDL 50, A1c 6.4       Expected Outcome Patient has medical history DES LAD, chronic HFpEF, HTN, hyperlipidemia. He is currently taking repatha  due to statin intolerance;  LDL is well controlled. A1c is in a pre-diabetic range. Will continue to discuss strategies for weight loss including calorie density, the plate method as a guide for meal planning, label reading, etc. Patient will benefit from participation in intensive cardiac rehab for nutrition education, exercise, and lifestyle modification.          Nutrition Goals Discharge (Final Nutrition Goals Re-Evaluation):  Nutrition Goals Re-Evaluation - 09/16/23 0911       Goals   Current Weight 326 lb 8 oz (148.1 kg)    Comment triglycerides 213, HDL 30, LDL 50, A1c 6.4    Expected Outcome Patient has medical history DES LAD, chronic HFpEF, HTN, hyperlipidemia. He is currently taking repatha  due to statin intolerance;  LDL is well controlled. A1c is in a pre-diabetic range. Will continue to discuss strategies for weight loss including calorie density, the plate method as a guide for meal planning, label reading, etc. Patient will benefit from participation in intensive cardiac rehab for nutrition education, exercise, and lifestyle modification.          Psychosocial: Target Goals: Acknowledge presence or absence of significant depression and/or stress, maximize coping skills, provide positive support system. Participant is able to verbalize types and ability to use techniques and skills needed for reducing stress and depression.  Initial Review & Psychosocial Screening:  Initial Psych Review & Screening - 09/05/23 1358       Initial Review   Current issues with None Identified      Family Dynamics   Good Support System? Yes   friends and family     Barriers   Psychosocial barriers to  participate in program There are no identifiable barriers or psychosocial needs.      Screening Interventions   Interventions Provide feedback about the scores to participant;Encouraged to exercise    Expected Outcomes Long Term goal: The participant improves quality of Life and PHQ9 Scores as seen by post scores and/or verbalization of changes;Short Term goal: Identification and review with participant of any Quality of Life or Depression concerns found by scoring the questionnaire.          Quality of Life Scores:  Quality of Life - 09/05/23 1531  Quality of Life   Select Quality of Life      Quality of Life Scores   Health/Function Pre 22.47 %    Socioeconomic Pre 27.25 %    Psych/Spiritual Pre 26.07 %    Family Pre 24.7 %    GLOBAL Pre 24.6 %         Scores of 19 and below usually indicate a poorer quality of life in these areas.  A difference of  2-3 points is a clinically meaningful difference.  A difference of 2-3 points in the total score of the Quality of Life Index has been associated with significant improvement in overall quality of life, self-image, physical symptoms, and general health in studies assessing change in quality of life.  PHQ-9: Review Flowsheet  More data exists      09/05/2023 02/18/2023 07/12/2022 07/10/2022 08/01/2020  Depression screen PHQ 2/9  Decreased Interest 0 0 2 0 0  Down, Depressed, Hopeless 0 0 0 0 0  PHQ - 2 Score 0 0 2 0 0  Altered sleeping 0 0 2 - 0  Tired, decreased energy 0 0 2 - 0  Change in appetite 0 0 2 - 0  Feeling bad or failure about yourself  0 0 2 - 0  Trouble concentrating 0 0 0 - 0  Moving slowly or fidgety/restless 0 0 0 - 0  Suicidal thoughts 0 0 0 - 0  PHQ-9 Score 0 0 10 - 0  Difficult doing work/chores Not difficult at all - - - -   Interpretation of Total Score  Total Score Depression Severity:  1-4 = Minimal depression, 5-9 = Mild depression, 10-14 = Moderate depression, 15-19 = Moderately severe  depression, 20-27 = Severe depression   Psychosocial Evaluation and Intervention:   Psychosocial Re-Evaluation:  Psychosocial Re-Evaluation     Row Name 09/16/23 1648             Psychosocial Re-Evaluation   Current issues with None Identified       Interventions Encouraged to attend Cardiac Rehabilitation for the exercise       Continue Psychosocial Services  No Follow up required          Psychosocial Discharge (Final Psychosocial Re-Evaluation):  Psychosocial Re-Evaluation - 09/16/23 1648       Psychosocial Re-Evaluation   Current issues with None Identified    Interventions Encouraged to attend Cardiac Rehabilitation for the exercise    Continue Psychosocial Services  No Follow up required          Vocational Rehabilitation: Provide vocational rehab assistance to qualifying candidates.   Vocational Rehab Evaluation & Intervention:  Vocational Rehab - 09/05/23 1359       Initial Vocational Rehab Evaluation & Intervention   Assessment shows need for Vocational Rehabilitation No   retired         Education: Education Goals: Education classes will be provided on a weekly basis, covering required topics. Participant will state understanding/return demonstration of topics presented.    Education     Row Name 09/16/23 0800     Education   Cardiac Education Topics Pritikin   Select Workshops     Workshops   Educator Exercise Physiologist   Select Psychosocial   Psychosocial Workshop Healthy Sleep for a Healthy Heart   Instruction Review Code 1- Verbalizes Understanding   Class Start Time 0815   Class Stop Time 0900   Class Time Calculation (min) 45 min    Row Name 09/18/23 0900  Education   Cardiac Education Topics Pritikin   Customer service manager   Weekly Topic Simple Sides and Sauces   Instruction Review Code 1- Verbalizes Understanding   Class Start Time 0815   Class Stop Time [phone]   Class Time  Calculation (min) 31 min      Core Videos: Exercise    Move It!  Clinical staff conducted group or individual video education with verbal and written material and guidebook.  Patient learns the recommended Pritikin exercise program. Exercise with the goal of living a long, healthy life. Some of the health benefits of exercise include controlled diabetes, healthier blood pressure levels, improved cholesterol levels, improved heart and lung capacity, improved sleep, and better body composition. Everyone should speak with their doctor before starting or changing an exercise routine.  Biomechanical Limitations Clinical staff conducted group or individual video education with verbal and written material and guidebook.  Patient learns how biomechanical limitations can impact exercise and how we can mitigate and possibly overcome limitations to have an impactful and balanced exercise routine.  Body Composition Clinical staff conducted group or individual video education with verbal and written material and guidebook.  Patient learns that body composition (ratio of muscle mass to fat mass) is a key component to assessing overall fitness, rather than body weight alone. Increased fat mass, especially visceral belly fat, can put us  at increased risk for metabolic syndrome, type 2 diabetes, heart disease, and even death. It is recommended to combine diet and exercise (cardiovascular and resistance training) to improve your body composition. Seek guidance from your physician and exercise physiologist before implementing an exercise routine.  Exercise Action Plan Clinical staff conducted group or individual video education with verbal and written material and guidebook.  Patient learns the recommended strategies to achieve and enjoy long-term exercise adherence, including variety, self-motivation, self-efficacy, and positive decision making. Benefits of exercise include fitness, good health, weight management,  more energy, better sleep, less stress, and overall well-being.  Medical   Heart Disease Risk Reduction Clinical staff conducted group or individual video education with verbal and written material and guidebook.  Patient learns our heart is our most vital organ as it circulates oxygen, nutrients, white blood cells, and hormones throughout the entire body, and carries waste away. Data supports a plant-based eating plan like the Pritikin Program for its effectiveness in slowing progression of and reversing heart disease. The video provides a number of recommendations to address heart disease.   Metabolic Syndrome and Belly Fat  Clinical staff conducted group or individual video education with verbal and written material and guidebook.  Patient learns what metabolic syndrome is, how it leads to heart disease, and how one can reverse it and keep it from coming back. You have metabolic syndrome if you have 3 of the following 5 criteria: abdominal obesity, high blood pressure, high triglycerides, low HDL cholesterol, and high blood sugar.  Hypertension and Heart Disease Clinical staff conducted group or individual video education with verbal and written material and guidebook.  Patient learns that high blood pressure, or hypertension, is very common in the [place] . Hypertension is largely due to excessive salt intake, but other important risk factors include being overweight, physical inactivity, drinking too much alcohol, smoking, and not eating enough potassium from fruits and vegetables. High blood pressure is a leading risk factor for heart attack, stroke, congestive heart failure, dementia, kidney failure, and premature death. Long-term effects of excessive salt  intake include stiffening of the arteries and thickening of heart muscle and organ damage. Recommendations include ways to reduce hypertension and the risk of heart disease.  Diseases of Our Time - Focusing on Diabetes Clinical staff  conducted group or individual video education with verbal and written material and guidebook.  Patient learns why the best way to stop diseases of our time is prevention, through food and other lifestyle changes. Medicine (such as prescription pills and surgeries) is often only a Band-Aid on the problem, not a long-term solution. Most common diseases of our time include obesity, type 2 diabetes, hypertension, heart disease, and cancer. The Pritikin Program is recommended and has been proven to help reduce, reverse, and/or prevent the damaging effects of metabolic syndrome.  Nutrition   Overview of the Pritikin Eating Plan  Clinical staff conducted group or individual video education with verbal and written material and guidebook.  Patient learns about the Pritikin Eating Plan for disease risk reduction. The Pritikin Eating Plan emphasizes a wide variety of unrefined, minimally-processed carbohydrates, like fruits, vegetables, whole grains, and legumes. Go, Caution, and Stop food choices are explained. Plant-based and lean animal proteins are emphasized. Rationale provided for low sodium intake for blood pressure control, low added sugars for blood sugar stabilization, and low added fats and oils for coronary artery disease risk reduction and weight management.  Calorie Density  Clinical staff conducted group or individual video education with verbal and written material and guidebook.  Patient learns about calorie density and how it impacts the Pritikin Eating Plan. Knowing the characteristics of the food you choose will help you decide whether those foods will lead to weight gain or weight loss, and whether you want to consume more or less of them. Weight loss is usually a side effect of the Pritikin Eating Plan because of its focus on low calorie-dense foods.  Label Reading  Clinical staff conducted group or individual video education with verbal and written material and guidebook.  Patient learns  about the Pritikin recommended label reading guidelines and corresponding recommendations regarding calorie density, added sugars, sodium content, and whole grains.  Dining Out - Part 1  Clinical staff conducted group or individual video education with verbal and written material and guidebook.  Patient learns that restaurant meals can be sabotaging because they can be so high in calories, fat, sodium, and/or sugar. Patient learns recommended strategies on how to positively address this and avoid unhealthy pitfalls.  Facts on Fats  Clinical staff conducted group or individual video education with verbal and written material and guidebook.  Patient learns that lifestyle modifications can be just as effective, if not more so, as many medications for lowering your risk of heart disease. A Pritikin lifestyle can help to reduce your risk of inflammation and atherosclerosis (cholesterol build-up, or plaque, in the artery walls). Lifestyle interventions such as dietary choices and physical activity address the cause of atherosclerosis. A review of the types of fats and their impact on blood cholesterol levels, along with dietary recommendations to reduce fat intake is also included.  Nutrition Action Plan  Clinical staff conducted group or individual video education with verbal and written material and guidebook.  Patient learns how to incorporate Pritikin recommendations into their lifestyle. Recommendations include planning and keeping personal health goals in mind as an important part of their success.  Healthy Mind-Set    Healthy Minds, Bodies, Hearts  Clinical staff conducted group or individual video education with verbal and written material and guidebook.  Patient  learns how to identify when they are stressed. Video will discuss the impact of that stress, as well as the many benefits of stress management. Patient will also be introduced to stress management techniques. The way we think, act, and  feel has an impact on our hearts.  How Our Thoughts Can Heal Our Hearts  Clinical staff conducted group or individual video education with verbal and written material and guidebook.  Patient learns that negative thoughts can cause depression and anxiety. This can result in negative lifestyle behavior and serious health problems. Cognitive behavioral therapy is an effective method to help control our thoughts in order to change and improve our emotional outlook.  Additional Videos:  Exercise    Improving Performance  Clinical staff conducted group or individual video education with verbal and written material and guidebook.  Patient learns to use a non-linear approach by alternating intensity levels and lengths of time spent exercising to help burn more calories and lose more body fat. Cardiovascular exercise helps improve heart health, metabolism, hormonal balance, blood sugar control, and recovery from fatigue. Resistance training improves strength, endurance, balance, coordination, reaction time, metabolism, and muscle mass. Flexibility exercise improves circulation, posture, and balance. Seek guidance from your physician and exercise physiologist before implementing an exercise routine and learn your capabilities and proper form for all exercise.  Introduction to Yoga  Clinical staff conducted group or individual video education with verbal and written material and guidebook.  Patient learns about yoga, a discipline of the coming together of mind, breath, and body. The benefits of yoga include improved flexibility, improved range of motion, better posture and core strength, increased lung function, weight loss, and positive self-image. Yoga's heart health benefits include lowered blood pressure, healthier heart rate, decreased cholesterol and triglyceride levels, improved immune function, and reduced stress. Seek guidance from your physician and exercise physiologist before implementing an exercise  routine and learn your capabilities and proper form for all exercise.  Medical   Aging: Enhancing Your Quality of Life  Clinical staff conducted group or individual video education with verbal and written material and guidebook.  Patient learns key strategies and recommendations to stay in good physical health and enhance quality of life, such as prevention strategies, having an advocate, securing a Health Care Proxy and Power of Attorney, and keeping a list of medications and system for tracking them. It also discusses how to avoid risk for bone loss.  Biology of Weight Control  Clinical staff conducted group or individual video education with verbal and written material and guidebook.  Patient learns that weight gain occurs because we consume more calories than we burn (eating more, moving less). Even if your body weight is normal, you may have higher ratios of fat compared to muscle mass. Too much body fat puts you at increased risk for cardiovascular disease, heart attack, stroke, type 2 diabetes, and obesity-related cancers. In addition to exercise, following the Pritikin Eating Plan can help reduce your risk.  Decoding Lab Results  Clinical staff conducted group or individual video education with verbal and written material and guidebook.  Patient learns that lab test reflects one measurement whose values change over time and are influenced by many factors, including medication, stress, sleep, exercise, food, hydration, pre-existing medical conditions, and more. It is recommended to use the knowledge from this video to become more involved with your lab results and evaluate your numbers to speak with your doctor.   Diseases of Our Time - Overview  Clinical staff conducted group  or individual video education with verbal and written material and guidebook.  Patient learns that according to the CDC, 50% to 70% of chronic diseases (such as obesity, type 2 diabetes, elevated lipids, hypertension,  and heart disease) are avoidable through lifestyle improvements including healthier food choices, listening to satiety cues, and increased physical activity.  Sleep Disorders Clinical staff conducted group or individual video education with verbal and written material and guidebook.  Patient learns how good quality and duration of sleep are important to overall health and well-being. Patient also learns about sleep disorders and how they impact health along with recommendations to address them, including discussing with a physician.  Nutrition  Dining Out - Part 2 Clinical staff conducted group or individual video education with verbal and written material and guidebook.  Patient learns how to plan ahead and communicate in order to maximize their dining experience in a healthy and nutritious manner. Included are recommended food choices based on the type of restaurant the patient is visiting.   Fueling a Banker conducted group or individual video education with verbal and written material and guidebook.  There is a strong connection between our food choices and our health. Diseases like obesity and type 2 diabetes are very prevalent and are in large-part due to lifestyle choices. The Pritikin Eating Plan provides plenty of food and hunger-curbing satisfaction. It is easy to follow, affordable, and helps reduce health risks.  Menu Workshop  Clinical staff conducted group or individual video education with verbal and written material and guidebook.  Patient learns that restaurant meals can sabotage health goals because they are often packed with calories, fat, sodium, and sugar. Recommendations include strategies to plan ahead and to communicate with the manager, chef, or server to help order a healthier meal.  Planning Your Eating Strategy  Clinical staff conducted group or individual video education with verbal and written material and guidebook.  Patient learns about the  Pritikin Eating Plan and its benefit of reducing the risk of disease. The Pritikin Eating Plan does not focus on calories. Instead, it emphasizes high-quality, nutrient-rich foods. By knowing the characteristics of the foods, we choose, we can determine their calorie density and make informed decisions.  Targeting Your Nutrition Priorities  Clinical staff conducted group or individual video education with verbal and written material and guidebook.  Patient learns that lifestyle habits have a tremendous impact on disease risk and progression. This video provides eating and physical activity recommendations based on your personal health goals, such as reducing LDL cholesterol, losing weight, preventing or controlling type 2 diabetes, and reducing high blood pressure.  Vitamins and Minerals  Clinical staff conducted group or individual video education with verbal and written material and guidebook.  Patient learns different ways to obtain key vitamins and minerals, including through a recommended healthy diet. It is important to discuss all supplements you take with your doctor.   Healthy Mind-Set    Smoking Cessation  Clinical staff conducted group or individual video education with verbal and written material and guidebook.  Patient learns that cigarette smoking and tobacco addiction pose a serious health risk which affects millions of people. Stopping smoking will significantly reduce the risk of heart disease, lung disease, and many forms of cancer. Recommended strategies for quitting are covered, including working with your doctor to develop a successful plan.  Culinary   Becoming a Set designer conducted group or individual video education with verbal and written material and guidebook.  Patient learns that cooking at home can be healthy, cost-effective, quick, and puts them in control. Keys to cooking healthy recipes will include looking at your recipe, assessing your  equipment needs, planning ahead, making it simple, choosing cost-effective seasonal ingredients, and limiting the use of added fats, salts, and sugars.  Cooking - Breakfast and Snacks  Clinical staff conducted group or individual video education with verbal and written material and guidebook.  Patient learns how important breakfast is to satiety and nutrition through the entire day. Recommendations include key foods to eat during breakfast to help stabilize blood sugar levels and to prevent overeating at meals later in the day. Planning ahead is also a key component.  Cooking - Educational psychologist conducted group or individual video education with verbal and written material and guidebook.  Patient learns eating strategies to improve overall health, including an approach to cook more at home. Recommendations include thinking of animal protein as a side on your plate rather than center stage and focusing instead on lower calorie dense options like vegetables, fruits, whole grains, and plant-based proteins, such as beans. Making sauces in large quantities to freeze for later and leaving the skin on your vegetables are also recommended to maximize your experience.  Cooking - Healthy Salads and Dressing Clinical staff conducted group or individual video education with verbal and written material and guidebook.  Patient learns that vegetables, fruits, whole grains, and legumes are the foundations of the Pritikin Eating Plan. Recommendations include how to incorporate each of these in flavorful and healthy salads, and how to create homemade salad dressings. Proper handling of ingredients is also covered. Cooking - Soups and [employer] - Soups and Desserts Clinical staff conducted group or individual video education with verbal and written material and guidebook.  Patient learns that Pritikin soups and desserts make for easy, nutritious, and delicious snacks and meal components that are  low in sodium, fat, sugar, and calorie density, while high in vitamins, minerals, and filling fiber. Recommendations include simple and healthy ideas for soups and desserts.   Overview     The Pritikin Solution Program Overview Clinical staff conducted group or individual video education with verbal and written material and guidebook.  Patient learns that the results of the Pritikin Program have been documented in more than 100 articles published in peer-reviewed journals, and the benefits include reducing risk factors for (and, in some cases, even reversing) high cholesterol, high blood pressure, type 2 diabetes, obesity, and more! An overview of the three key pillars of the Pritikin Program will be covered: eating well, doing regular exercise, and having a healthy mind-set.  WORKSHOPS  Exercise: Exercise Basics: Building Your Action Plan Clinical staff led group instruction and group discussion with PowerPoint presentation and patient guidebook. To enhance the learning environment the use of posters, models and videos may be added. At the conclusion of this workshop, patients will comprehend the difference between physical activity and exercise, as well as the benefits of incorporating both, into their routine. Patients will understand the FITT (Frequency, Intensity, Time, and Type) principle and how to use it to build an exercise action plan. In addition, safety concerns and other considerations for exercise and cardiac rehab will be addressed by the presenter. The purpose of this lesson is to promote a comprehensive and effective weekly exercise routine in order to improve patients' overall level of fitness.   Managing Heart Disease: Your Path to a Healthier Heart Clinical staff led group instruction  and group discussion with PowerPoint presentation and patient guidebook. To enhance the learning environment the use of posters, models and videos may be added.At the conclusion of this workshop,  patients will understand the anatomy and physiology of the heart. Additionally, they will understand how Pritikin's three pillars impact the risk factors, the progression, and the management of heart disease.  The purpose of this lesson is to provide a high-level overview of the heart, heart disease, and how the Pritikin lifestyle positively impacts risk factors.  Exercise Biomechanics Clinical staff led group instruction and group discussion with PowerPoint presentation and patient guidebook. To enhance the learning environment the use of posters, models and videos may be added. Patients will learn how the structural parts of their bodies function and how these functions impact their daily activities, movement, and exercise. Patients will learn how to promote a neutral spine, learn how to manage pain, and identify ways to improve their physical movement in order to promote healthy living. The purpose of this lesson is to expose patients to common physical limitations that impact physical activity. Participants will learn practical ways to adapt and manage aches and pains, and to minimize their effect on regular exercise. Patients will learn how to maintain good posture while sitting, walking, and lifting.  Balance Training and Fall Prevention  Clinical staff led group instruction and group discussion with PowerPoint presentation and patient guidebook. To enhance the learning environment the use of posters, models and videos may be added. At the conclusion of this workshop, patients will understand the importance of their sensorimotor skills (vision, proprioception, and the vestibular system) in maintaining their ability to balance as they age. Patients will apply a variety of balancing exercises that are appropriate for their current level of function. Patients will understand the common causes for poor balance, possible solutions to these problems, and ways to modify their physical environment  in order to minimize their fall risk. The purpose of this lesson is to teach patients about the importance of maintaining balance as they age and ways to minimize their risk of falling.  WORKSHOPS   Nutrition:  Fueling a Ship broker led group instruction and group discussion with PowerPoint presentation and patient guidebook. To enhance the learning environment the use of posters, models and videos may be added. Patients will review the foundational principles of the Pritikin Eating Plan and understand what constitutes a serving size in each of the food groups. Patients will also learn Pritikin-friendly foods that are better choices when away from home and review make-ahead meal and snack options. Calorie density will be reviewed and applied to three nutrition priorities: weight maintenance, weight loss, and weight gain. The purpose of this lesson is to reinforce (in a group setting) the key concepts around what patients are recommended to eat and how to apply these guidelines when away from home by planning and selecting Pritikin-friendly options. Patients will understand how calorie density may be adjusted for different weight management goals.  Mindful Eating  Clinical staff led group instruction and group discussion with PowerPoint presentation and patient guidebook. To enhance the learning environment the use of posters, models and videos may be added. Patients will briefly review the concepts of the Pritikin Eating Plan and the importance of low-calorie dense foods. The concept of mindful eating will be introduced as well as the importance of paying attention to internal hunger signals. Triggers for non-hunger eating and techniques for dealing with triggers will be explored. The purpose of this lesson is  to provide patients with the opportunity to review the basic principles of the Pritikin Eating Plan, discuss the value of eating mindfully and how to measure internal cues of hunger  and fullness using the Hunger Scale. Patients will also discuss reasons for non-hunger eating and learn strategies to use for controlling emotional eating.  Targeting Your Nutrition Priorities Clinical staff led group instruction and group discussion with PowerPoint presentation and patient guidebook. To enhance the learning environment the use of posters, models and videos may be added. Patients will learn how to determine their genetic susceptibility to disease by reviewing their family history. Patients will gain insight into the importance of diet as part of an overall healthy lifestyle in mitigating the impact of genetics and other environmental insults. The purpose of this lesson is to provide patients with the opportunity to assess their personal nutrition priorities by looking at their family history, their own health history and current risk factors. Patients will also be able to discuss ways of prioritizing and modifying the Pritikin Eating Plan for their highest risk areas  Menu  Clinical staff led group instruction and group discussion with PowerPoint presentation and patient guidebook. To enhance the learning environment the use of posters, models and videos may be added. Using menus brought in from [employer], or printed from [employer], patients will apply the Pritikin dining out guidelines that were presented in the [employer] video. Patients will also be able to practice these guidelines in a variety of provided scenarios. The purpose of this lesson is to provide patients with the opportunity to practice hands-on learning of the Pritikin Dining Out guidelines with actual menus and practice scenarios.  Label Reading Clinical staff led group instruction and group discussion with PowerPoint presentation and patient guidebook. To enhance the learning environment the use of posters, models and videos may be added. Patients will review and discuss the Pritikin label  reading guidelines presented in Pritikin's Label Reading Educational series video. Using fool labels brought in from local grocery stores and markets, patients will apply the label reading guidelines and determine if the packaged food meet the Pritikin guidelines. The purpose of this lesson is to provide patients with the opportunity to review, discuss, and practice hands-on learning of the Pritikin Label Reading guidelines with actual packaged food labels. Cooking School  Pritikin's [employer] are designed to teach patients ways to prepare quick, simple, and affordable recipes at home. The importance of nutrition's role in chronic disease risk reduction is reflected in its emphasis in the overall Pritikin program. By learning how to prepare essential core Pritikin Eating Plan recipes, patients will increase control over what they eat; be able to customize the flavor of foods without the use of added salt, sugar, or fat; and improve the quality of the food they consume. By learning a set of core recipes which are easily assembled, quickly prepared, and affordable, patients are more likely to prepare more healthy foods at home. These workshops focus on convenient breakfasts, simple entres, side dishes, and desserts which can be prepared with minimal effort and are consistent with nutrition recommendations for cardiovascular risk reduction. Cooking [employer] are taught by a Armed forces logistics/support/administrative officer (RD) who has been trained by the [employer]. The chef or RD has a clear understanding of the importance of minimizing - if not completely eliminating - added fat, sugar, and sodium in recipes. Throughout the series of Cooking School Workshop sessions, patients will learn about healthy  ingredients and efficient methods of cooking to build confidence in their capability to prepare    Cooking School weekly topics:  Adding Flavor- Sodium-Free  Fast and Healthy  Breakfasts  Powerhouse Plant-Based Proteins  Satisfying Salads and Dressings  Simple Sides and Sauces  International Cuisine-Spotlight on the [employer] Zones  Delicious Desserts  Savory Soups  [employer] - Meals in a Snap  Tasty Appetizers and Snacks  Comforting Weekend Breakfasts  One-Pot Wonders   Fast Evening Meals  Landscape architect Your Pritikin Plate  WORKSHOPS   Healthy Mindset (Psychosocial):  Focused Goals, Sustainable Changes Clinical staff led group instruction and group discussion with PowerPoint presentation and patient guidebook. To enhance the learning environment the use of posters, models and videos may be added. Patients will be able to apply effective goal setting strategies to establish at least one personal goal, and then take consistent, meaningful action toward that goal. They will learn to identify common barriers to achieving personal goals and develop strategies to overcome them. Patients will also gain an understanding of how our mind-set can impact our ability to achieve goals and the importance of cultivating a positive and growth-oriented mind-set. The purpose of this lesson is to provide patients with a deeper understanding of how to set and achieve personal goals, as well as the tools and strategies needed to overcome common obstacles which may arise along the way.  From Head to Heart: The Power of a Healthy Outlook  Clinical staff led group instruction and group discussion with PowerPoint presentation and patient guidebook. To enhance the learning environment the use of posters, models and videos may be added. Patients will be able to recognize and describe the impact of emotions and mood on physical health. They will discover the importance of self-care and explore self-care practices which may work for them. Patients will also learn how to utilize the 4 C's to cultivate a healthier outlook and better manage stress and challenges. The  purpose of this lesson is to demonstrate to patients how a healthy outlook is an essential part of maintaining good health, especially as they continue their cardiac rehab journey.  Healthy Sleep for a Healthy Heart Clinical staff led group instruction and group discussion with PowerPoint presentation and patient guidebook. To enhance the learning environment the use of posters, models and videos may be added. At the conclusion of this workshop, patients will be able to demonstrate knowledge of the importance of sleep to overall health, well-being, and quality of life. They will understand the symptoms of, and treatments for, common sleep disorders. Patients will also be able to identify daytime and nighttime behaviors which impact sleep, and they will be able to apply these tools to help manage sleep-related challenges. The purpose of this lesson is to provide patients with a general overview of sleep and outline the importance of quality sleep. Patients will learn about a few of the most common sleep disorders. Patients will also be introduced to the concept of "sleep hygiene," and discover ways to self-manage certain sleeping problems through simple daily behavior changes. Finally, the workshop will motivate patients by clarifying the links between quality sleep and their goals of heart-healthy living.   Recognizing and Reducing Stress Clinical staff led group instruction and group discussion with PowerPoint presentation and patient guidebook. To enhance the learning environment the use of posters, models and videos may be added. At the conclusion of this workshop, patients will be able to understand the types of stress reactions, differentiate between  acute and chronic stress, and recognize the impact that chronic stress has on their health. They will also be able to apply different coping mechanisms, such as reframing negative self-talk. Patients will have the opportunity to practice a variety of stress  management techniques, such as deep abdominal breathing, progressive muscle relaxation, and/or guided imagery.  The purpose of this lesson is to educate patients on the role of stress in their lives and to provide healthy techniques for coping with it.  Learning Barriers/Preferences:  Learning Barriers/Preferences - 09/05/23 1400       Learning Barriers/Preferences   Learning Barriers Sight   glasses   Learning Preferences Audio;Computer/Internet;Skilled Demonstration;Group Instruction;Individual Instruction;Pictoral;Verbal Instruction;Video;Written Material          Education Topics:  Knowledge Questionnaire Score:  Knowledge Questionnaire Score - 09/05/23 1411       Knowledge Questionnaire Score   Pre Score 18/24          Core Components/Risk Factors/Patient Goals at Admission:  Personal Goals and Risk Factors at Admission - 09/05/23 1359       Core Components/Risk Factors/Patient Goals on Admission    Weight Management Yes;Obesity;Weight Loss    Intervention Weight Management: Develop a combined nutrition and exercise program designed to reach desired caloric intake, while maintaining appropriate intake of nutrient and fiber, sodium and fats, and appropriate energy expenditure required for the weight goal.;Weight Management: Provide education and appropriate resources to help participant work on and attain dietary goals.;Weight Management/Obesity: Establish reasonable short term and long term weight goals.;Obesity: Provide education and appropriate resources to help participant work on and attain dietary goals.    Admit Weight 326 lb 8 oz (148.1 kg)    Goal Weight: Long Term 226 lb (102.5 kg)   pt goal   Diabetes Yes    Intervention Provide education about signs/symptoms and action to take for hypo/hyperglycemia.;Provide education about proper nutrition, including hydration, and aerobic/resistive exercise prescription along with prescribed medications to achieve blood glucose  in normal ranges: Fasting glucose 65-99 mg/dL    Expected Outcomes Short Term: Participant verbalizes understanding of the signs/symptoms and immediate care of hyper/hypoglycemia, proper foot care and importance of medication, aerobic/resistive exercise and nutrition plan for blood glucose control.;Long Term: Attainment of HbA1C < 7%.    Heart Failure Yes    Intervention Provide a combined exercise and nutrition program that is supplemented with education, support and counseling about heart failure. Directed toward relieving symptoms such as shortness of breath, decreased exercise tolerance, and extremity edema.    Expected Outcomes Improve functional capacity of life;Short term: Attendance in program 2-3 days a week with increased exercise capacity. Reported lower sodium intake. Reported increased fruit and vegetable intake. Reports medication compliance.;Short term: Daily weights obtained and reported for increase. Utilizing diuretic protocols set by physician.;Long term: Adoption of self-care skills and reduction of barriers for early signs and symptoms recognition and intervention leading to self-care maintenance.    Hypertension Yes    Intervention Provide education on lifestyle modifcations including regular physical activity/exercise, weight management, moderate sodium restriction and increased consumption of fresh fruit, vegetables, and low fat dairy, alcohol moderation, and smoking cessation.;Monitor prescription use compliance.    Expected Outcomes Long Term: Maintenance of blood pressure at goal levels.;Short Term: Continued assessment and intervention until BP is < 140/6mm HG in hypertensive participants. < 130/24mm HG in hypertensive participants with diabetes, heart failure or chronic kidney disease.    Lipids Yes    Intervention Provide education and support for participant on nutrition &  aerobic/resistive exercise along with prescribed medications to achieve LDL 70mg , HDL >40mg .     Expected Outcomes Short Term: Participant states understanding of desired cholesterol values and is compliant with medications prescribed. Participant is following exercise prescription and nutrition guidelines.;Long Term: Cholesterol controlled with medications as prescribed, with individualized exercise RX and with personalized nutrition plan. Value goals: LDL < 70mg , HDL > 40 mg.          Core Components/Risk Factors/Patient Goals Review:   Goals and Risk Factor Review     Row Name 09/16/23 1649             Core Components/Risk Factors/Patient Goals Review   Personal Goals Review Weight Management/Obesity;Heart Failure;Lipids;Hypertension;Diabetes       Review Pt started cardiac rehab on 09/16/23. Pt is diet-controlled diabetic.  Moderate systolic BP elevation noted during exercise.       Expected Outcomes Pt will continue to participate in cardiac rehab for exercise, nutrition, and lifestyle modifications.          Core Components/Risk Factors/Patient Goals at Discharge (Final Review):   Goals and Risk Factor Review - 09/16/23 1649       Core Components/Risk Factors/Patient Goals Review   Personal Goals Review Weight Management/Obesity;Heart Failure;Lipids;Hypertension;Diabetes    Review Pt started cardiac rehab on 09/16/23. Pt is diet-controlled diabetic.  Moderate systolic BP elevation noted during exercise.    Expected Outcomes Pt will continue to participate in cardiac rehab for exercise, nutrition, and lifestyle modifications.          ITP Comments:  ITP Comments     Row Name 09/05/23 1253 09/16/23 1645         ITP Comments Dr. Wilbert Bihari medical director. Introduction to pritikin education/intensive cardiac rehab. Initial orientation packet reviewed with patient. 30 Day ITP review. Kamar started cardiac rehab on 09/16/23. Pt did fair with exercise.  Pt is somewhat deconditioned.         Comments: See ITP comments.

## 2023-09-25 ENCOUNTER — Encounter (HOSPITAL_COMMUNITY)
Admission: RE | Admit: 2023-09-25 | Discharge: 2023-09-25 | Disposition: A | Discharge status: Home / Self Care | Payer: Medicare (Managed Care) | Source: Ambulatory Visit | Attending: Cardiovascular Disease | Admitting: Cardiovascular Disease

## 2023-09-25 ENCOUNTER — Encounter (HOSPITAL_COMMUNITY): Payer: Medicare (Managed Care)

## 2023-09-25 DIAGNOSIS — Z955 Presence of coronary angioplasty implant and graft: Secondary | ICD-10-CM | POA: Diagnosis not present

## 2023-09-27 ENCOUNTER — Encounter (HOSPITAL_COMMUNITY): Payer: Medicare (Managed Care)

## 2023-09-27 ENCOUNTER — Encounter (HOSPITAL_COMMUNITY)
Admission: RE | Admit: 2023-09-27 | Discharge: 2023-09-27 | Disposition: A | Discharge status: Home / Self Care | Payer: Medicare (Managed Care) | Source: Ambulatory Visit | Attending: Cardiovascular Disease | Admitting: Cardiovascular Disease

## 2023-09-27 DIAGNOSIS — Z955 Presence of coronary angioplasty implant and graft: Secondary | ICD-10-CM

## 2023-09-30 ENCOUNTER — Encounter (HOSPITAL_COMMUNITY): Payer: Medicare (Managed Care)

## 2023-09-30 ENCOUNTER — Encounter (HOSPITAL_COMMUNITY)
Admission: RE | Admit: 2023-09-30 | Discharge: 2023-09-30 | Disposition: A | Discharge status: Home / Self Care | Payer: Medicare (Managed Care) | Source: Ambulatory Visit | Attending: Cardiovascular Disease

## 2023-09-30 DIAGNOSIS — Z955 Presence of coronary angioplasty implant and graft: Secondary | ICD-10-CM

## 2023-10-01 ENCOUNTER — Ambulatory Visit (HOSPITAL_COMMUNITY)
Admission: RE | Admit: 2023-10-01 | Discharge: 2023-10-01 | Disposition: A | Discharge status: Home / Self Care | Payer: Medicare (Managed Care) | Source: Ambulatory Visit | Attending: Cardiology | Admitting: Cardiology

## 2023-10-01 DIAGNOSIS — I25110 Atherosclerotic heart disease of native coronary artery with unstable angina pectoris: Secondary | ICD-10-CM | POA: Insufficient documentation

## 2023-10-01 DIAGNOSIS — I5032 Chronic diastolic (congestive) heart failure: Secondary | ICD-10-CM | POA: Diagnosis not present

## 2023-10-01 LAB — ECHOCARDIOGRAM COMPLETE
Area-P 1/2: 3.46 cm2
S' Lateral: 3.2 cm

## 2023-10-02 ENCOUNTER — Encounter (HOSPITAL_COMMUNITY): Payer: Medicare (Managed Care)

## 2023-10-02 ENCOUNTER — Encounter (HOSPITAL_COMMUNITY)
Admission: RE | Admit: 2023-10-02 | Discharge: 2023-10-02 | Disposition: A | Discharge status: Home / Self Care | Payer: Medicare (Managed Care) | Source: Ambulatory Visit | Attending: Cardiovascular Disease

## 2023-10-02 DIAGNOSIS — Z955 Presence of coronary angioplasty implant and graft: Secondary | ICD-10-CM | POA: Diagnosis not present

## 2023-10-02 LAB — GLUCOSE, CAPILLARY: Glucose-Capillary: 100 mg/dL — ABNORMAL HIGH (ref 70–99)

## 2023-10-02 NOTE — Progress Notes (Signed)
 Jatavis reported feeling lightheaded on the arm ergometer. Blood pressure 122/90. Upon having the patient to stand up he still reported feeling lightheaded. Blood pressure 122/90. Hear rate 101. Oxygen saturation 97% on room air. CBG 100. Patient said he ate oatmeal for breakfast. Given apple juice. Recommended adding a protein such as a hard boiled egg to his breakfast. Will notify onsite provider Rosaline Bane NP about symptoms.

## 2023-10-04 ENCOUNTER — Encounter (HOSPITAL_COMMUNITY): Payer: Medicare (Managed Care)

## 2023-10-07 ENCOUNTER — Encounter (HOSPITAL_COMMUNITY): Payer: Medicare (Managed Care)

## 2023-10-07 ENCOUNTER — Ambulatory Visit: Payer: Self-pay | Admitting: Cardiovascular Disease

## 2023-10-07 ENCOUNTER — Encounter (HOSPITAL_COMMUNITY): Admission: RE | Admit: 2023-10-07 | Payer: Medicare (Managed Care) | Source: Ambulatory Visit

## 2023-10-07 DIAGNOSIS — I7121 Aneurysm of the ascending aorta, without rupture: Secondary | ICD-10-CM

## 2023-10-09 ENCOUNTER — Encounter (HOSPITAL_COMMUNITY): Payer: Medicare (Managed Care)

## 2023-10-09 ENCOUNTER — Encounter (HOSPITAL_COMMUNITY)
Admission: RE | Admit: 2023-10-09 | Discharge: 2023-10-09 | Disposition: A | Discharge status: Home / Self Care | Payer: Medicare (Managed Care) | Source: Ambulatory Visit | Attending: Cardiovascular Disease

## 2023-10-09 DIAGNOSIS — Z955 Presence of coronary angioplasty implant and graft: Secondary | ICD-10-CM | POA: Diagnosis not present

## 2023-10-10 ENCOUNTER — Ambulatory Visit: Payer: Medicare (Managed Care)

## 2023-10-10 VITALS — Ht 67.0 in | Wt 325.0 lb

## 2023-10-10 DIAGNOSIS — Z0000 Encounter for general adult medical examination without abnormal findings: Secondary | ICD-10-CM | POA: Diagnosis not present

## 2023-10-10 NOTE — Progress Notes (Signed)
 Because this visit was a virtual/telehealth visit,  certain criteria was not obtained, such a blood pressure, CBG if applicable, and timed get up and go. Any medications not marked as taking were not mentioned during the medication reconciliation part of the visit. Any vitals not documented were not able to be obtained due to this being a telehealth visit or patient was unable to self-report a recent blood pressure reading due to a lack of equipment at home via telehealth. Vitals that have been documented are verbally provided by the patient.   Subjective:   Alex [name] is a 68 y.o. who presents for a Medicare Wellness preventive visit.  As a reminder, Annual Wellness Visits don't include a physical exam, and some assessments may be limited, especially if this visit is performed virtually. We may recommend an in-person follow-up visit with your provider if needed.  Visit Complete: Virtual I connected with  Alex [name] on 10/10/23 by a audio enabled telemedicine application and verified that I am speaking with the correct person using two identifiers.  Patient Location: Home  Provider Location: Home Office  I discussed the limitations of evaluation and management by telemedicine. The patient expressed understanding and agreed to proceed.  Vital Signs: Because this visit was a virtual/telehealth visit, some criteria may be missing or patient reported. Any vitals not documented were not able to be obtained and vitals that have been documented are patient reported.  VideoDeclined- This patient declined Librarian, academic. Therefore the visit was completed with audio only.  Persons Participating in Visit: Patient.  AWV Questionnaire: No: Patient Medicare AWV questionnaire was not completed prior to this visit.  Cardiac Risk Factors include: advanced age (>50men, >26 women);sedentary lifestyle;dyslipidemia;family history of premature cardiovascular  disease;hypertension;male gender;obesity (BMI >30kg/m2) (EXERCISE: WALKING, LEG EXERCISES)     Objective:    Today's Vitals   10/10/23 1614  Weight: (!) 325 lb (147.4 kg)  Height: 5' 7 (1.702 m)  PainSc: 0-No pain   Body mass index is 50.9 kg/m.     10/10/2023    4:15 PM 08/14/2023    6:39 AM 07/05/2023   12:43 PM 07/04/2023    3:50 PM 11/13/2022    2:36 PM 06/16/2022    4:14 PM 05/09/2020    2:59 PM  Advanced Directives  Does Patient Have a Medical Advance Directive? No No No No No No No  Would patient like information on creating a medical advance directive? Yes (MAU/Ambulatory/Procedural Areas - Information given) Yes (MAU/Ambulatory/Procedural Areas - Information given) No - Patient declined No - Patient declined   No - Patient declined    Current Medications (verified) Outpatient Encounter Medications as of 10/10/2023  Medication Sig   acetaminophen  (TYLENOL ) 500 MG tablet Take 500 mg by mouth every 6 (six) hours as needed for moderate pain.   allopurinol  (ZYLOPRIM ) 100 MG tablet Take 1 tablet (100 mg total) by mouth daily as needed (gout).   amLODipine  (NORVASC ) 10 MG tablet TAKE 1 TABLET BY MOUTH ONCE DAILY WITH SUPPER   aspirin  81 MG chewable tablet Chew 1 tablet (81 mg total) by mouth daily.   colchicine  0.6 MG tablet Take 1 tablet (0.6 mg total) by mouth daily as needed (gout).   Evolocumab  (REPATHA  SURECLICK) 140 MG/ML SOAJ INJECT 140 MG INTO THE SKIN EVERY 14 DAYS   glucose blood test strip 1 each by Other route daily. Use as instructed (Patient not taking: Reported on 09/05/2023)   irbesartan  (AVAPRO ) 150 MG tablet Take  1 tablet (150 mg total) by mouth daily. Please make overdue appt with Dr. Verlin before anymore refills  Thank you 3rd and Final attempt   metoprolol  succinate (TOPROL -XL) 25 MG 24 hr tablet Take 1 tablet (25 mg total) by mouth daily.   naproxen  (NAPROSYN ) 500 MG tablet TAKE 1 TABLET BY MOUTH ONCE DAILY AS NEEDED FOR MODERATE PAIN   nitroGLYCERIN   (NITROSTAT ) 0.4 MG SL tablet Place 1 tablet (0.4 mg total) under the tongue every 5 (five) minutes as needed for chest pain.   omeprazole  (PRILOSEC) 40 MG capsule TAKE 1 CAPSULE BY MOUTH ONCE DAILY WITH SUPPER   prasugrel  (EFFIENT ) 10 MG TABS tablet Take 1 tablet (10 mg total) by mouth daily.   triamcinolone  ointment (KENALOG ) 0.5 % APPLY OINTMENT TOPICALLY TO AFFECTED AREA AS NEEDED   No facility-administered encounter medications on file as of 10/10/2023.    Allergies (verified) Atorvastatin , Codeine, Crestor  [rosuvastatin ], and Shellfish allergy   History: Past Medical History:  Diagnosis Date   Asthma    CAD (coronary artery disease)    Cardiac cath 2002 with 25% distal RCA stenosis.    DDD (degenerative disc disease), lumbar    Diabetes mellitus (HCC)    Diverticulitis    Esophageal stricture    Gastritis 2010   GERD (gastroesophageal reflux disease)    GI bleed    Gout    HTN (hypertension)    Hyperlipidemia    Insomnia    MI (myocardial infarction) (HCC) 2009   no stent placement   OA (osteoarthritis)    Obesity    OSA on CPAP    Seasonal allergies    TIA (transient ischemic attack) 2013   Past Surgical History:  Procedure Laterality Date   COLONOSCOPY WITH PROPOFOL  N/A 11/11/2018   Procedure: COLONOSCOPY WITH PROPOFOL ;  Surgeon: Eda Iha, MD;  Location: [facility] ENDOSCOPY;  Service: Gastroenterology;  Laterality: N/A;   CORONARY STENT INTERVENTION N/A 08/14/2023   Procedure: CORONARY STENT INTERVENTION;  Surgeon: Verlin Lonni BIRCH, MD;  Location: [facility] INVASIVE CV LAB;  Service: Cardiovascular;  Laterality: N/A;   ESOPHAGOGASTRODUODENOSCOPY     multiple   LEFT HEART CATH AND CORONARY ANGIOGRAPHY N/A 08/14/2023   Procedure: LEFT HEART CATH AND CORONARY ANGIOGRAPHY;  Surgeon: Verlin Lonni BIRCH, MD;  Location: [facility] INVASIVE CV LAB;  Service: Cardiovascular;  Laterality: N/A;   NASAL SINUS SURGERY     POLYPECTOMY  11/11/2018   Procedure: POLYPECTOMY;  Surgeon:  Eda Iha, MD;  Location: [facility] ENDOSCOPY;  Service: Gastroenterology;;   VASECTOMY     Family History  Problem Relation Age of Onset   Heart attack Mother    Heart failure Mother    Colon cancer Neg Hx    Esophageal cancer Neg Hx    Rectal cancer Neg Hx    Stomach cancer Neg Hx    Social History   Socioeconomic History   Marital status: Widowed    Spouse name: Not on file   Number of children: 4   Years of education: 48   Highest education level: Not on file  Occupational History   Occupation: Disability-truck driver  Tobacco Use   Smoking status: Former    Current packs/day: 0.00    Average packs/day: 1.5 packs/day for 10.0 years (15.0 ttl pk-yrs)    Types: Cigarettes    Start date: 11/05/1981    Quit date: 11/06/1991    Years since quitting: 31.9   Smokeless tobacco: Never   Tobacco comments:    quit 1993  Vaping  Use   Vaping status: Never Used  Substance and Sexual Activity   Alcohol use: No   Drug use: No   Sexual activity: Not on file  Other Topics Concern   Not on file  Social History Narrative   On disability   [employer]   Quit smoking 20 years ago (as of 01/2012)      Health Care POA:    Emergency Contact: brother, Prentice [phone]   End of Life Plan: gave Ad pamphlet 5/15   Who lives with you: self   Any pets: none   Diet: pt has a varied diet of protein, starch and vegetables.   Exercise: pt does not have regular exercise routine.   Seatbelts: Pt reports wearing seatbelt when in vehicles.    Hobbies: plays bass and keyboard in church music group         Social Drivers of Health   Financial Resource Strain: Low Risk  (10/10/2023)   Overall Financial Resource Strain (CARDIA)    Difficulty of Paying Living Expenses: Not hard at all  Food Insecurity: No Food Insecurity (10/10/2023)   Hunger Vital Sign    Worried About Running Out of Food in the Last Year: Never true    Ran Out of Food in the Last Year: Never true  Transportation Needs: No  Transportation Needs (10/10/2023)   PRAPARE - Administrator, Civil Service (Medical): No    Lack of Transportation (Non-Medical): No  Physical Activity: Inactive (10/10/2023)   Exercise Vital Sign    Days of Exercise per Week: 0 days    Minutes of Exercise per Session: 0 min  Stress: No Stress Concern Present (10/10/2023)   [employer] of Occupational Health - Occupational Stress Questionnaire    Feeling of Stress: Not at all  Social Connections: Unknown (10/10/2023)   Social Connection and Isolation Panel    Frequency of Communication with Friends and Family: Three times a week    Frequency of Social Gatherings with Friends and Family: Three times a week    Attends Religious Services: More than 4 times per year    Active Member of Clubs or Organizations: Yes    Attends Engineer, structural: More than 4 times per year    Marital Status: Patient declined    Tobacco Counseling Counseling given: Not Answered Tobacco comments: quit 1993    Clinical Intake:  Pre-visit preparation completed: Yes  Pain : No/denies pain Pain Score: 0-No pain     BMI - recorded: 50.9 Nutritional Status: BMI > 30  Obese Nutritional Risks: None Diabetes: No  Lab Results  Component Value Date   HGBA1C 6.4 11/13/2022   HGBA1C 6.2 06/11/2022   HGBA1C 6.2 01/24/2021     How often do you need to have someone help you when you read instructions, pamphlets, or other written materials from your doctor or pharmacy?: 1 - Never  Interpreter Needed?: No  Information entered by :: Meshawn Oconnor N. Kase Shughart, LPN.   Activities of Daily Living     10/10/2023    4:18 PM 08/14/2023    5:00 PM  In your present state of health, do you have any difficulty performing the following activities:  Hearing? 0 0  Vision? 0 0  Difficulty concentrating or making decisions? 0 0  Walking or climbing stairs? 0   Dressing or bathing? 0   Doing errands, shopping? 0 0  Preparing Food and eating ? N    Using the Toilet? N   In  the past six months, have you accidently leaked urine? N   Do you have problems with loss of bowel control? N   Managing your Medications? N   Managing your Finances? N   Housekeeping or managing your Housekeeping? N     Patient Care Team: Elicia Hamlet, MD as PCP - General (Family Medicine) Verlin Lonni BIRCH, MD as PCP - Cardiology (Cardiology) Alvia Bring, DO as Referring Physician (Family Medicine) Lelon Glendia ONEIDA DEVONNA (Physician Assistant) Avram Lupita BRAVO, MD (Gastroenterology) Fleeta Zerita ONEIDA, MD as Consulting Physician (Ophthalmology)  I have updated your Care Teams any recent Medical Services you may have received from other providers in the past year.     Assessment:   This is a routine wellness examination for Dmarcus.  Hearing/Vision screen Hearing Screening - Comments:: Denies hearing difficulties.  Vision Screening - Comments:: Wears rx glasses - up to date with routine eye exams with [facility]    Goals Addressed             This Visit's Progress    10/10/2023: Maintain my health by continuing cardiac rehab.         Depression Screen     10/10/2023    4:16 PM 09/05/2023    1:58 PM 02/18/2023    2:04 PM 07/12/2022    2:35 PM 07/10/2022    9:00 AM 08/01/2020    8:39 AM 05/09/2020    3:06 PM  PHQ 2/9 Scores  PHQ - 2 Score 0 0 0 2 0 0 0  PHQ- 9 Score 0 0 0 10  0 0    Fall Risk     10/10/2023    4:16 PM 10/09/2023    8:38 AM 10/02/2023    8:28 AM 09/30/2023    9:01 AM 09/27/2023   10:40 AM  Fall Risk   Falls in the past year? 0 0 0 0 0  Number falls in past yr: 0 0 0 0 0  Injury with Fall? 0 0 0 0 0  Risk for fall due to : No Fall Risks No Fall Risks No Fall Risks No Fall Risks No Fall Risks  Follow up Falls evaluation completed Falls evaluation completed Falls evaluation completed Falls evaluation completed Falls evaluation completed    MEDICARE RISK AT HOME:  Medicare Risk at Home Any stairs in or around  the home?: No If so, are there any without handrails?: No Home free of loose throw rugs in walkways, pet beds, electrical cords, etc?: Yes Adequate lighting in your home to reduce risk of falls?: Yes Life alert?: No Use of a cane, walker or w/c?: No Grab bars in the bathroom?: No Shower chair or bench in shower?: Yes (NOT IN USE AT THIS TIME) Elevated toilet seat or a handicapped toilet?: No  TIMED UP AND GO:  Was the test performed?  No  Cognitive Function: Declined/Normal: No cognitive concerns noted by patient or family. Patient alert, oriented, able to answer questions appropriately and recall recent events. No signs of memory loss or confusion.    10/10/2023    4:19 PM 07/16/2013   10:00 AM  MMSE - Mini Mental State Exam  Not completed: Unable to complete   Orientation to time  5   Orientation to Place  5   Registration  3   Attention/ Calculation  5   Recall  3   Language- name 2 objects  2   Language- repeat  1  Language- follow 3 step command  3   Language- read & follow direction  1   Write a sentence  1   Copy design  1   Total score  30      Data saved with a previous flowsheet row definition        10/10/2023    4:20 PM 07/10/2022    9:07 AM  6CIT Screen  What Year? 0 points 0 points  What month? 0 points 0 points  What time? 0 points 0 points  Count back from 20 0 points 0 points  Months in reverse 0 points 0 points  Repeat phrase 0 points 0 points  Total Score 0 points 0 points    Immunizations Immunization History  Administered Date(s) Administered   Hepatitis B, ADULT 08/17/2016, 10/17/2016   Influenza Whole 03/01/2008, 12/29/2008   Influenza,inj,Quad PF,6+ Mos 01/01/2013, 04/01/2015, 01/20/2016, 02/07/2017, 12/22/2018   PFIZER(Purple Top)SARS-COV-2 Vaccination 06/08/2019, 06/29/2019, 03/23/2020   Pfizer Covid-19 Vaccine Bivalent Booster 27yrs & up 05/17/2021   Pneumococcal Polysaccharide-23 06/23/2008   Td 03/12/2006   Tdap 05/09/2020     Screening Tests Health Maintenance  Topic Date Due   Zoster Vaccines- Shingrix (1 of 2) Never done   Pneumococcal Vaccine: 50+ Years (2 of 2 - PCV) 06/23/2009   Hepatitis B Vaccines (3 of 3 - 19+ 3-dose series) 02/16/2017   FOOT EXAM  05/25/2020   OPHTHALMOLOGY EXAM  02/08/2022   COVID-19 Vaccine (5 - 2024-25 season) 11/11/2022   HEMOGLOBIN A1C  05/13/2023   Diabetic kidney evaluation - Urine ACR  06/11/2023   INFLUENZA VACCINE  10/11/2023   Diabetic kidney evaluation - eGFR measurement  08/14/2024   Medicare Annual Wellness (AWV)  10/09/2024   Colonoscopy  11/10/2028   DTaP/Tdap/Td (3 - Td or Tdap) 05/09/2030   Hepatitis C Screening  Completed   HPV VACCINES  Aged Out   Meningococcal B Vaccine  Aged Out    Health Maintenance  Health Maintenance Due  Topic Date Due   Zoster Vaccines- Shingrix (1 of 2) Never done   Pneumococcal Vaccine: 50+ Years (2 of 2 - PCV) 06/23/2009   Hepatitis B Vaccines (3 of 3 - 19+ 3-dose series) 02/16/2017   FOOT EXAM  05/25/2020   OPHTHALMOLOGY EXAM  02/08/2022   COVID-19 Vaccine (5 - 2024-25 season) 11/11/2022   HEMOGLOBIN A1C  05/13/2023   Diabetic kidney evaluation - Urine ACR  06/11/2023   Health Maintenance Items Addressed: Yes Patient aware of current care gaps.  Additional Screening:  Vision Screening: Recommended annual ophthalmology exams for early detection of glaucoma and other disorders of the eye. Would you like a referral to an eye doctor? No    Dental Screening: Recommended annual dental exams for proper oral hygiene  Community Resource Referral / Chronic Care Management: CRR required this visit?  No   CCM required this visit?  No   Plan:    I have personally reviewed and noted the following in the patient's chart:   Medical and social history Use of alcohol, tobacco or illicit drugs  Current medications and supplements including opioid prescriptions. Patient is not currently taking opioid  prescriptions. Functional ability and status Nutritional status Physical activity Advanced directives List of other physicians Hospitalizations, surgeries, and ER visits in previous 12 months Vitals Screenings to include cognitive, depression, and falls Referrals and appointments  In addition, I have reviewed and discussed with patient certain preventive protocols, quality metrics, and best practice recommendations. A written personalized care plan for preventive services as well as  general preventive health recommendations were provided to patient.   Roz LOISE Fuller, LPN   [id number]   After Visit Summary: (MyChart) Due to this being a telephonic visit, the after visit summary with patients personalized plan was offered to patient via MyChart   Notes: Patient aware of current care gaps.  Patient aware of current care gaps.  Immunization record was verified by [facility] and updated in patient's chart. Nurse will request eye exam report from [facility].

## 2023-10-10 NOTE — Patient Instructions (Addendum)
 Alex [name] , Thank you for taking time out of your busy schedule to complete your Annual Wellness Visit with me. I enjoyed our conversation and look forward to speaking with you again next year. I, as well as your care team,  appreciate your ongoing commitment to your health goals. Please review the following plan we discussed and let me know if I can assist you in the future. Your Game plan/ To Do List    Referrals: If you haven't heard from the office you've been referred to, please reach out to them at the phone provided.   Follow up Visits: We will see or speak with you next year for your Next Medicare AWV with our clinical staff Have you seen your provider in the last 6 months (3 months if uncontrolled diabetes)? No  Clinician Recommendations:  Aim for 30 minutes of exercise or brisk walking, 6-8 glasses of water, and 5 servings of fruits and vegetables each day.       This is a list of the screenings recommended for you:  Health Maintenance  Topic Date Due   Zoster (Shingles) Vaccine (1 of 2) Never done   Pneumococcal Vaccine for age over 26 (2 of 2 - PCV) 06/23/2009   Hepatitis B Vaccine (3 of 3 - 19+ 3-dose series) 02/16/2017   Complete foot exam   05/25/2020   Eye exam for diabetics  02/08/2022   COVID-19 Vaccine (5 - 2024-25 season) 11/11/2022   Hemoglobin A1C  05/13/2023   Yearly kidney health urinalysis for diabetes  06/11/2023   Flu Shot  10/11/2023   Yearly kidney function blood test for diabetes  08/14/2024   Medicare Annual Wellness Visit  10/09/2024   Colon Cancer Screening  11/10/2028   DTaP/Tdap/Td vaccine (3 - Td or Tdap) 05/09/2030   Hepatitis C Screening  Completed   HPV Vaccine  Aged Out   Meningitis B Vaccine  Aged Out    Advanced directives: (Provided) Advance directive discussed with you today. I have provided a copy for you to complete at home and have notarized. Once this is complete, please bring a copy in to our office so we can scan it into your chart.   Advance Care Planning is important because it:  [x]  Makes sure you receive the medical care that is consistent with your values, goals, and preferences  [x]  It provides guidance to your family and loved ones and reduces their decisional burden about whether or not they are making the right decisions based on your wishes.  Follow the link provided in your after visit summary or read over the paperwork we have mailed to you to help you started getting your Advance Directives in place. If you need assistance in completing these, please reach out to us  so that we can help you!  See attachments for Preventive Care and Fall Prevention Tips.

## 2023-10-11 ENCOUNTER — Encounter (HOSPITAL_COMMUNITY): Payer: Medicare (Managed Care)

## 2023-10-14 ENCOUNTER — Encounter (HOSPITAL_COMMUNITY): Payer: Medicare (Managed Care)

## 2023-10-14 ENCOUNTER — Telehealth (HOSPITAL_COMMUNITY): Payer: Self-pay

## 2023-10-14 ENCOUNTER — Encounter (HOSPITAL_COMMUNITY)
Admission: RE | Admit: 2023-10-14 | Discharge: 2023-10-14 | Disposition: A | Discharge status: Home / Self Care | Payer: Medicare (Managed Care) | Source: Ambulatory Visit | Attending: Cardiovascular Disease | Admitting: Cardiovascular Disease

## 2023-10-14 DIAGNOSIS — Z955 Presence of coronary angioplasty implant and graft: Secondary | ICD-10-CM | POA: Diagnosis present

## 2023-10-14 NOTE — Telephone Encounter (Signed)
 Patient has new [facility] Medicare as of 8/01  Pt insurance is active and benefits verified through [facility]. Co-pay $0, DED $0/$0 met, out of pocket $0/$0 met, co-insurance 0%. No pre-authorization required. 10/14/2023 @ 2:04pm, spoke with Toy, REF# [id number].  How many CR sessions are covered? (36 visits for TCR, 72 visits for ICR)72 ICR Is this a lifetime maximum or an annual maximum? Annual Has the member used any of these services to date? No Is there a time limit (weeks/months) on start of program and/or program completion? No

## 2023-10-16 ENCOUNTER — Encounter (HOSPITAL_COMMUNITY)
Admission: RE | Admit: 2023-10-16 | Discharge: 2023-10-16 | Discharge status: Home / Self Care | Payer: Medicare (Managed Care) | Source: Ambulatory Visit | Attending: Cardiovascular Disease

## 2023-10-16 ENCOUNTER — Encounter (HOSPITAL_COMMUNITY): Payer: Medicare (Managed Care)

## 2023-10-16 DIAGNOSIS — Z955 Presence of coronary angioplasty implant and graft: Secondary | ICD-10-CM

## 2023-10-18 ENCOUNTER — Encounter (HOSPITAL_COMMUNITY): Payer: Medicare (Managed Care)

## 2023-10-18 ENCOUNTER — Encounter (HOSPITAL_COMMUNITY)
Admission: RE | Admit: 2023-10-18 | Discharge: 2023-10-18 | Disposition: A | Discharge status: Home / Self Care | Source: Ambulatory Visit | Attending: Cardiovascular Disease | Admitting: Cardiovascular Disease

## 2023-10-18 DIAGNOSIS — Z955 Presence of coronary angioplasty implant and graft: Secondary | ICD-10-CM | POA: Diagnosis not present

## 2023-10-21 ENCOUNTER — Encounter (HOSPITAL_COMMUNITY)
Admission: RE | Admit: 2023-10-21 | Discharge: 2023-10-21 | Disposition: A | Discharge status: Home / Self Care | Payer: Medicare (Managed Care) | Source: Ambulatory Visit | Attending: Cardiovascular Disease

## 2023-10-21 ENCOUNTER — Encounter (HOSPITAL_COMMUNITY): Payer: Medicare (Managed Care)

## 2023-10-21 DIAGNOSIS — Z955 Presence of coronary angioplasty implant and graft: Secondary | ICD-10-CM

## 2023-10-22 NOTE — Progress Notes (Signed)
 Cardiac Individual Treatment Plan  Patient Details  Name: Alex [name] MRN: [id number] Date of Birth: 08/13/1955 Referring Provider:   Flowsheet Row CARDIAC REHAB PHASE II ORIENTATION from 09/05/2023 in [facility]  Referring Provider Lonni Cash, MD    Initial Encounter Date:  Flowsheet Row CARDIAC REHAB PHASE II ORIENTATION from 09/05/2023 in [facility]  Date 09/05/23    Visit Diagnosis: 08/14/23 DES LAD  Patient's Home Medications on Admission:  Current Outpatient Medications:    acetaminophen  (TYLENOL ) 500 MG tablet, Take 500 mg by mouth every 6 (six) hours as needed for moderate pain., Disp: , Rfl:    allopurinol  (ZYLOPRIM ) 100 MG tablet, Take 1 tablet (100 mg total) by mouth daily as needed (gout)., Disp: , Rfl:    amLODipine  (NORVASC ) 10 MG tablet, TAKE 1 TABLET BY MOUTH ONCE DAILY WITH SUPPER, Disp: 90 tablet, Rfl: 0   aspirin  81 MG chewable tablet, Chew 1 tablet (81 mg total) by mouth daily., Disp: 30 tablet, Rfl: 2   colchicine  0.6 MG tablet, Take 1 tablet (0.6 mg total) by mouth daily as needed (gout)., Disp: , Rfl:    Evolocumab  (REPATHA  SURECLICK) 140 MG/ML SOAJ, INJECT 140 MG INTO THE SKIN EVERY 14 DAYS, Disp: 2 mL, Rfl: 11   glucose blood test strip, 1 each by Other route daily. Use as instructed (Patient not taking: Reported on 09/05/2023), Disp: 100 each, Rfl: 12   irbesartan  (AVAPRO ) 150 MG tablet, Take 1 tablet (150 mg total) by mouth daily. Please make overdue appt with Dr. Cash before anymore refills  Thank you 3rd and Final attempt, Disp: 15 tablet, Rfl: 0   metoprolol  succinate (TOPROL -XL) 25 MG 24 hr tablet, Take 1 tablet (25 mg total) by mouth daily., Disp: 30 tablet, Rfl: 11   naproxen  (NAPROSYN ) 500 MG tablet, TAKE 1 TABLET BY MOUTH ONCE DAILY AS NEEDED FOR MODERATE PAIN, Disp: 30 tablet, Rfl: 0   nitroGLYCERIN  (NITROSTAT ) 0.4 MG SL  tablet, Place 1 tablet (0.4 mg total) under the tongue every 5 (five) minutes as needed for chest pain., Disp: 25 tablet, Rfl: 3   omeprazole  (PRILOSEC) 40 MG capsule, TAKE 1 CAPSULE BY MOUTH ONCE DAILY WITH SUPPER, Disp: 90 capsule, Rfl: 0   prasugrel  (EFFIENT ) 10 MG TABS tablet, Take 1 tablet (10 mg total) by mouth daily., Disp: 90 tablet, Rfl: 3   triamcinolone  ointment (KENALOG ) 0.5 %, APPLY OINTMENT TOPICALLY TO AFFECTED AREA AS NEEDED, Disp: 15 g, Rfl: 2  Past Medical History: Past Medical History:  Diagnosis Date   Asthma    CAD (coronary artery disease)    Cardiac cath 2002 with 25% distal RCA stenosis.    DDD (degenerative disc disease), lumbar    Diabetes mellitus (HCC)    Diverticulitis    Esophageal stricture    Gastritis 2010   GERD (gastroesophageal reflux disease)    GI bleed    Gout    HTN (hypertension)    Hyperlipidemia    Insomnia    MI (myocardial infarction) (HCC) 2009   no stent placement   OA (osteoarthritis)    Obesity    OSA on CPAP    Seasonal allergies    TIA (transient ischemic attack) 2013    Tobacco Use: Social History   Tobacco Use  Smoking Status Former   Current packs/day: 0.00   Average packs/day: 1.5 packs/day for 10.0 years (15.0 ttl pk-yrs)  Types: Cigarettes   Start date: 11/05/1981   Quit date: 11/06/1991   Years since quitting: 31.9  Smokeless Tobacco Never  Tobacco Comments   quit 1993    Labs: Review Flowsheet  More data exists      Latest Ref Rng & Units 01/24/2021 06/11/2022 06/16/2022 11/13/2022 02/18/2023  Labs for ITP Cardiac and Pulmonary Rehab  Cholestrol 100 - 199 mg/dL 766  750  - - 884   LDL (calc) 0 - 99 mg/dL 840  844  - - 50   HDL-C >39 mg/dL 30  30  - - 30   Trlycerides 0 - 149 mg/dL 762  660  - - 786   Hemoglobin A1c 0.0 - 7.0 % 6.2  6.2  - 6.4  -  TCO2 22 - 32 mmol/L - - 23  - -    Capillary Blood Glucose: Lab Results  Component Value Date   GLUCAP 100 (H) 10/02/2023   GLUCAP 115 (H) 08/14/2023    GLUCAP 99 09/05/2017   GLUCAP 99 11/27/2012   GLUCAP 88 11/27/2012     Exercise Target Goals: Exercise Program Goal: Individual exercise prescription set using results from initial 6 min walk test and THRR while considering  patient's activity barriers and safety.   Exercise Prescription Goal: Initial exercise prescription builds to 30-45 minutes a day of aerobic activity, 2-3 days per week.  Home exercise guidelines will be given to patient during program as part of exercise prescription that the participant will acknowledge.  Activity Barriers & Risk Stratification:  Activity Barriers & Cardiac Risk Stratification - 09/05/23 1358       Activity Barriers & Cardiac Risk Stratification   Activity Barriers Arthritis;Joint Problems    Cardiac Risk Stratification High   <5 METs on         6 Minute Walk:  6 Minute Walk     Row Name 09/05/23 1528         6 Minute Walk   Phase Initial     Distance 1140 feet     Walk Time 6 minutes     # of Rest Breaks 0     MPH 2.16     METS 1.55     RPE 14     Perceived Dyspnea  0     VO2 Peak 5.42     Symptoms Yes (comment)     Comments no pain, a little winded. resolved with rest     Resting HR 63 bpm     Resting BP 112/86     Resting Oxygen Saturation  97 %     Exercise Oxygen Saturation  during 6 min walk 97 %     Max Ex. HR 110 bpm     Max Ex. BP 142/82     2 Minute Post BP 122/86        Oxygen Initial Assessment:   Oxygen Re-Evaluation:   Oxygen Discharge (Final Oxygen Re-Evaluation):   Initial Exercise Prescription:  Initial Exercise Prescription - 09/05/23 1500       Date of Initial Exercise RX and Referring Provider   Date 09/05/23    Referring Provider Lonni Cash, MD    Expected Discharge Date 11/27/23      NuStep   Level 1    SPM 70    Minutes 15    METs 1.5      Track   Laps 13    Minutes 15    METs 1.5  Prescription Details   Frequency (times per week) 3    Duration  Progress to 30 minutes of continuous aerobic without signs/symptoms of physical distress      Intensity   THRR 40-80% of Max Heartrate 61-122    Ratings of Perceived Exertion 11-13    Perceived Dyspnea 0-4      Progression   Progression Continue progressive overload as per policy without signs/symptoms or physical distress.      Resistance Training   Training Prescription Yes    Weight 3    Reps 10-15          Perform Capillary Blood Glucose checks as needed.  Exercise Prescription Changes:   Exercise Prescription Changes     Row Name 09/16/23 1600 10/09/23 1200 10/16/23 1100         Response to Exercise   Blood Pressure (Admit) 130/80 122/80 124/78     Blood Pressure (Exercise) 160/88 142/80 152/80     Blood Pressure (Exit) 126/80 128/72 118/78     Heart Rate (Admit) 77 bpm 77 bpm 80 bpm     Heart Rate (Exercise) 147 bpm 115 bpm 125 bpm     Heart Rate (Exit) 86 bpm 92 bpm 87 bpm     Rating of Perceived Exertion (Exercise) 12 11 12      Symptoms 4/10 bilateral knee apin None None     Comments Pt's first day in the [facility] program Reviewed METs Reviewed METs and goals     Duration Progress to 30 minutes of  aerobic without signs/symptoms of physical distress Progress to 30 minutes of  aerobic without signs/symptoms of physical distress Continue with 30 min of aerobic exercise without signs/symptoms of physical distress.     Intensity THRR unchanged THRR unchanged THRR unchanged       Progression   Progression Continue to progress workloads to maintain intensity without signs/symptoms of physical distress. Continue to progress workloads to maintain intensity without signs/symptoms of physical distress. Continue to progress workloads to maintain intensity without signs/symptoms of physical distress.     Average METs 2.3 2.6 2.1       Resistance Training   Training Prescription Yes No No     Weight 3 No wts on wednesdays No wts on wednesdays     Reps 10-15 -- --     Time 5  Minutes -- --       Interval Training   Interval Training No No No       NuStep   Level 1 -- --     Minutes 10 -- --     METs 1.8 -- --       Arm Ergometer   Level -- 1 1.5     Watts -- -- 15     Minutes -- 5 15     METs -- -- 1.5       Track   Laps 14 13 13      Minutes 15 15 14      METs 2.79 2.66 2.66        Exercise Comments:   Exercise Comments     Row Name 09/16/23 1643 10/09/23 1203 10/16/23 1117       Exercise Comments Pt's first day in the [facility] program. Pt had c/o of bilateral knee pain on nustep. Will continue to monitor for pain on this modality. Reviewed METs. Pt is making slow progress. Pt Reviewed METs and goals. Pt has seen some imrpovement in his strength, stamina, and energy , all  of which are goals. Pt voices his goal to eat healthier is a work in progress; pt has not lost weight which is a goal. Will ask dietician to speak with patient.        Exercise Goals and Review:   Exercise Goals     Row Name 09/05/23 1254             Exercise Goals   Increase Physical Activity Yes       Intervention Provide advice, education, support and counseling about physical activity/exercise needs.;Develop an individualized exercise prescription for aerobic and resistive training based on initial evaluation findings, risk stratification, comorbidities and participant's personal goals.       Expected Outcomes Short Term: Attend rehab on a regular basis to increase amount of physical activity.;Long Term: Exercising regularly at least 3-5 days a week.;Long Term: Add in home exercise to make exercise part of routine and to increase amount of physical activity.       Increase Strength and Stamina Yes       Intervention Develop an individualized exercise prescription for aerobic and resistive training based on initial evaluation findings, risk stratification, comorbidities and participant's personal goals.;Provide advice, education, support and counseling about physical  activity/exercise needs.       Expected Outcomes Short Term: Increase workloads from initial exercise prescription for resistance, speed, and METs.;Short Term: Perform resistance training exercises routinely during rehab and add in resistance training at home;Long Term: Improve cardiorespiratory fitness, muscular endurance and strength as measured by increased METs and functional capacity ( )       Able to understand and use rate of perceived exertion (RPE) scale Yes       Intervention Provide education and explanation on how to use RPE scale       Expected Outcomes Short Term: Able to use RPE daily in rehab to express subjective intensity level;Long Term:  Able to use RPE to guide intensity level when exercising independently       Knowledge and understanding of Target Heart Rate Range (THRR) Yes       Intervention Provide education and explanation of THRR including how the numbers were predicted and where they are located for reference       Expected Outcomes Short Term: Able to state/look up THRR;Short Term: Able to use daily as guideline for intensity in rehab;Long Term: Able to use THRR to govern intensity when exercising independently       Understanding of Exercise Prescription Yes       Intervention Provide education, explanation, and written materials on patient's individual exercise prescription       Expected Outcomes Long Term: Able to explain home exercise prescription to exercise independently;Short Term: Able to explain program exercise prescription          Exercise Goals Re-Evaluation :  Exercise Goals Re-Evaluation     Row Name 09/16/23 1642             Exercise Goal Re-Evaluation   Exercise Goals Review Increase Physical Activity;Increase Strength and Stamina;Able to understand and use rate of perceived exertion (RPE) scale;Knowledge and understanding of Target Heart Rate Range (THRR);Understanding of Exercise Prescription       Comments Pt's first day in the [facility]  program. Pt understands the exercise Rx, RPE sclae and THRR.       Expected Outcomes Will continue to monitor and progress exercise workloads as tolerated.          Discharge Exercise Prescription (Final Exercise Prescription Changes):  Exercise Prescription Changes - 10/16/23 1100       Response to Exercise   Blood Pressure (Admit) 124/78    Blood Pressure (Exercise) 152/80    Blood Pressure (Exit) 118/78    Heart Rate (Admit) 80 bpm    Heart Rate (Exercise) 125 bpm    Heart Rate (Exit) 87 bpm    Rating of Perceived Exertion (Exercise) 12    Symptoms None    Comments Reviewed METs and goals    Duration Continue with 30 min of aerobic exercise without signs/symptoms of physical distress.    Intensity THRR unchanged      Progression   Progression Continue to progress workloads to maintain intensity without signs/symptoms of physical distress.    Average METs 2.1      Resistance Training   Training Prescription No    Weight No wts on wednesdays      Interval Training   Interval Training No      Arm Ergometer   Level 1.5    Watts 15    Minutes 15    METs 1.5      Track   Laps 13    Minutes 14    METs 2.66          Nutrition:  Target Goals: Understanding of nutrition guidelines, daily intake of sodium 1500mg , cholesterol 200mg , calories 30% from fat and 7% or less from saturated fats, daily to have 5 or more servings of fruits and vegetables.  Biometrics:  Pre Biometrics - 09/05/23 1252       Pre Biometrics   Waist Circumference 59 inches    Hip Circumference 53 inches    Waist to Hip Ratio 1.11 %    Triceps Skinfold 48 mm    % Body Fat 50.8 %    Grip Strength 29 kg    Flexibility 9 in    Single Leg Stand 1 seconds           Nutrition Therapy Plan and Nutrition Goals:  Nutrition Therapy & Goals - 10/16/23 0857       Nutrition Therapy   Diet Heart Healthy Diet      Personal Nutrition Goals   Nutrition Goal Patient to identify strategies for  reducing cardiovascular risk by attending the [facility] education and nutrition series weekly.   goal in progress.   Personal Goal #2 Patient to improve diet quality by using the plate method as a guide for meal planning to include lean protein/plant protein, fruits, vegetables, whole grains, nonfat dairy as part of a well-balanced diet.   goal in progress.   Personal Goal #3 Patient to identify strategies for weight loss with goal of 0.5-2.0# per week.   goal not met.   Comments Goals in progress.Patient has medical history DES LAD, chronic HFpEF, HTN, hyperlipidemia. He continues to attend the [facility] education/nutrition series regularly. He is currently taking repatha  due to statin intolerance; LDL is well controlled. A1c is in a pre-diabetic range. Have discussed strategies for weight loss including calorie density, the plate method as a guide for meal planning, label reading, etc. He has maintained his weight since starting with our program. Patient will benefit from participation in intensive cardiac rehab for nutrition education, exercise, and lifestyle modification.      Intervention Plan   Intervention Prescribe, educate and counsel regarding individualized specific dietary modifications aiming towards targeted core components such as weight, hypertension, lipid management, diabetes, heart failure and other comorbidities.;Nutrition handout(s) given to patient.  Expected Outcomes Short Term Goal: Understand basic principles of dietary content, such as calories, fat, sodium, cholesterol and nutrients.;Long Term Goal: Adherence to prescribed nutrition plan.          Nutrition Assessments:  Nutrition Assessments - 09/16/23 1046       Rate Your Plate Scores   Pre Score 39         MEDIFICTS Score Key: >=70 Need to make dietary changes  40-70 Heart Healthy Diet <= 40 Therapeutic Level Cholesterol Diet   Flowsheet Row CARDIAC REHAB PHASE II EXERCISE from 09/16/2023 in [facility]  Picture Your Plate Total Score on Admission 39   Picture Your Plate Scores: <59 Unhealthy dietary pattern with much room for improvement. 41-50 Dietary pattern unlikely to meet recommendations for good health and room for improvement. 51-60 More healthful dietary pattern, with some room for improvement.  >60 Healthy dietary pattern, although there may be some specific behaviors that could be improved.    Nutrition Goals Re-Evaluation:  Nutrition Goals Re-Evaluation     Row Name 09/16/23 0911 10/16/23 0857           Goals   Current Weight 326 lb 8 oz (148.1 kg) 327 lb 9.7 oz (148.6 kg)      Comment triglycerides 213, HDL 30, LDL 50, A1c 6.4 triglycerides 213, HDL 30, LDL 50, A1c 6.4      Expected Outcome Patient has medical history DES LAD, chronic HFpEF, HTN, hyperlipidemia. He is currently taking repatha  due to statin intolerance;  LDL is well controlled. A1c is in a pre-diabetic range. Will continue to discuss strategies for weight loss including calorie density, the plate method as a guide for meal planning, label reading, etc. Patient will benefit from participation in intensive cardiac rehab for nutrition education, exercise, and lifestyle modification. Goals in progress.Patient has medical history DES LAD, chronic HFpEF, HTN, hyperlipidemia. He continues to attend the [facility] education/nutrition series regularly. He is currently taking repatha  due to statin intolerance; LDL is well controlled. A1c is in a pre-diabetic range. Have discussed strategies for weight loss including calorie density, the plate method as a guide for meal planning, label reading, etc. He has maintained his weight since starting with our program. Patient will benefit from participation in intensive cardiac rehab for nutrition education, exercise, and lifestyle modification.         Nutrition Goals Re-Evaluation:  Nutrition Goals Re-Evaluation     Row Name  09/16/23 0911 10/16/23 0857           Goals   Current Weight 326 lb 8 oz (148.1 kg) 327 lb 9.7 oz (148.6 kg)      Comment triglycerides 213, HDL 30, LDL 50, A1c 6.4 triglycerides 213, HDL 30, LDL 50, A1c 6.4      Expected Outcome Patient has medical history DES LAD, chronic HFpEF, HTN, hyperlipidemia. He is currently taking repatha  due to statin intolerance;  LDL is well controlled. A1c is in a pre-diabetic range. Will continue to discuss strategies for weight loss including calorie density, the plate method as a guide for meal planning, label reading, etc. Patient will benefit from participation in intensive cardiac rehab for nutrition education, exercise, and lifestyle modification. Goals in progress.Patient has medical history DES LAD, chronic HFpEF, HTN, hyperlipidemia. He continues to attend the [facility] education/nutrition series regularly. He is currently taking repatha  due to statin intolerance; LDL is well controlled. A1c is in a pre-diabetic range. Have discussed strategies  for weight loss including calorie density, the plate method as a guide for meal planning, label reading, etc. He has maintained his weight since starting with our program. Patient will benefit from participation in intensive cardiac rehab for nutrition education, exercise, and lifestyle modification.         Nutrition Goals Discharge (Final Nutrition Goals Re-Evaluation):  Nutrition Goals Re-Evaluation - 10/16/23 0857       Goals   Current Weight 327 lb 9.7 oz (148.6 kg)    Comment triglycerides 213, HDL 30, LDL 50, A1c 6.4    Expected Outcome Goals in progress.Patient has medical history DES LAD, chronic HFpEF, HTN, hyperlipidemia. He continues to attend the [facility] education/nutrition series regularly. He is currently taking repatha  due to statin intolerance; LDL is well controlled. A1c is in a pre-diabetic range. Have discussed strategies for weight loss including calorie density, the plate method as a guide for  meal planning, label reading, etc. He has maintained his weight since starting with our program. Patient will benefit from participation in intensive cardiac rehab for nutrition education, exercise, and lifestyle modification.          Psychosocial: Target Goals: Acknowledge presence or absence of significant depression and/or stress, maximize coping skills, provide positive support system. Participant is able to verbalize types and ability to use techniques and skills needed for reducing stress and depression.  Initial Review & Psychosocial Screening:  Initial Psych Review & Screening - 09/05/23 1358       Initial Review   Current issues with None Identified      Family Dynamics   Good Support System? Yes   friends and family     Barriers   Psychosocial barriers to participate in program There are no identifiable barriers or psychosocial needs.      Screening Interventions   Interventions Provide feedback about the scores to participant;Encouraged to exercise    Expected Outcomes Long Term goal: The participant improves quality of Life and PHQ9 Scores as seen by post scores and/or verbalization of changes;Short Term goal: Identification and review with participant of any Quality of Life or Depression concerns found by scoring the questionnaire.          Quality of Life Scores:  Quality of Life - 09/05/23 1531       Quality of Life   Select Quality of Life      Quality of Life Scores   Health/Function Pre 22.47 %    Socioeconomic Pre 27.25 %    Psych/Spiritual Pre 26.07 %    Family Pre 24.7 %    GLOBAL Pre 24.6 %         Scores of 19 and below usually indicate a poorer quality of life in these areas.  A difference of  2-3 points is a clinically meaningful difference.  A difference of 2-3 points in the total score of the Quality of Life Index has been associated with significant improvement in overall quality of life, self-image, physical symptoms, and general health in  studies assessing change in quality of life.  PHQ-9: Review Flowsheet  More data exists      10/10/2023 09/05/2023 02/18/2023 07/12/2022 07/10/2022  Depression screen PHQ 2/9  Decreased Interest 0 0 0 2 0  Down, Depressed, Hopeless 0 0 0 0 0  PHQ - 2 Score 0 0 0 2 0  Altered sleeping 0 0 0 2 -  Tired, decreased energy 0 0 0 2 -  Change in appetite 0 0 0 2 -  Feeling bad or failure about yourself  0 0 0 2 -  Trouble concentrating 0 0 0 0 -  Moving slowly or fidgety/restless 0 0 0 0 -  Suicidal thoughts 0 0 0 0 -  PHQ-9 Score 0 0 0 10 -  Difficult doing work/chores Not difficult at all Not difficult at all - - -   Interpretation of Total Score  Total Score Depression Severity:  1-4 = Minimal depression, 5-9 = Mild depression, 10-14 = Moderate depression, 15-19 = Moderately severe depression, 20-27 = Severe depression   Psychosocial Evaluation and Intervention:   Psychosocial Re-Evaluation:  Psychosocial Re-Evaluation     Row Name 09/16/23 1648 10/22/23 1816           Psychosocial Re-Evaluation   Current issues with None Identified None Identified      Interventions Encouraged to attend Cardiac Rehabilitation for the exercise Encouraged to attend Cardiac Rehabilitation for the exercise      Continue Psychosocial Services  No Follow up required No Follow up required         Psychosocial Discharge (Final Psychosocial Re-Evaluation):  Psychosocial Re-Evaluation - 10/22/23 1816       Psychosocial Re-Evaluation   Current issues with None Identified    Interventions Encouraged to attend Cardiac Rehabilitation for the exercise    Continue Psychosocial Services  No Follow up required          Vocational Rehabilitation: Provide vocational rehab assistance to qualifying candidates.   Vocational Rehab Evaluation & Intervention:  Vocational Rehab - 09/05/23 1359       Initial Vocational Rehab Evaluation & Intervention   Assessment shows need for Vocational Rehabilitation No    retired         Education: Education Goals: Education classes will be provided on a weekly basis, covering required topics. Participant will state understanding/return demonstration of topics presented.    Education     Row Name 09/16/23 0800     Education   Cardiac Education Topics [facility]   Geographical information systems officer Psychosocial   Psychosocial Workshop Healthy Sleep for a Healthy Heart   Instruction Review Code 1- Verbalizes Understanding   Class Start Time 0815   Class Stop Time 0900   Class Time Calculation (min) 45 min    Row Name 09/18/23 0900     Education   Cardiac Education Topics [facility]   Orthoptist   Educator Dietitian   Weekly Topic Simple Sides and Sauces   Instruction Review Code 1- Verbalizes Understanding   Class Start Time 0815   Class Stop Time 0846   Class Time Calculation (min) 31 min    Row Name 09/25/23 0900     Education   Cardiac Education Topics [facility]   Secondary school teacher School   Educator Dietitian   Weekly Topic Powerhouse Plant-Based Proteins   Instruction Review Code 1- Verbalizes Understanding   Class Start Time 0815   Class Stop Time 0850   Class Time Calculation (min) 35 min    Row Name 09/30/23 0900     Education   Cardiac Education Topics Pritikin   Geographical information systems officer Psychosocial   Psychosocial Workshop From Head to Heart: The Power of a Healthy Outlook   Instruction Review Code 1- Verbalizes Understanding   Class Start Time  0815   Class Stop Time 0853   Class Time Calculation (min) 38 min    Row Name 10/02/23 0800     Education   Cardiac Education Topics Pritikin   Secondary school teacher School   Educator Dietitian   Weekly Topic Tasty Appetizers and Snacks   Instruction Review Code 1- Verbalizes Understanding   Class Start Time 0815    Class Stop Time 0856   Class Time Calculation (min) 41 min    Row Name 10/09/23 0800     Education   Cardiac Education Topics Pritikin   Secondary school teacher School   Educator Dietitian   Weekly Topic Adding Flavor - Sodium-Free   Instruction Review Code 1- Verbalizes Understanding   Class Start Time 0815   Class Stop Time 0850   Class Time Calculation (min) 35 min    Row Name 10/14/23 0800     Education   Cardiac Education Topics Pritikin   [employer]     Workshops   Educator Dietitian   Select Nutrition   Nutrition Workshop Label Reading   Instruction Review Code 1- Verbalizes Understanding   Class Start Time 0815   Class Stop Time 0910   Class Time Calculation (min) 55 min    Row Name 10/16/23 0800     Education   Cardiac Education Topics Pritikin   Select Core Videos     Core Videos   Educator Exercise Physiologist   Select Nutrition   Nutrition Other  Label reading   Instruction Review Code 1- Verbalizes Understanding   Class Start Time 0815   Class Stop Time 0847   Class Time Calculation (min) 32 min    Row Name 10/18/23 0900     Education   Cardiac Education Topics Pritikin   Orthoptist   Educator Dietitian   Weekly Topic Fast and Healthy Breakfasts   Instruction Review Code 1- Verbalizes Understanding   Class Start Time 0815   Class Stop Time 0847   Class Time Calculation (min) 32 min    Row Name 10/21/23 0800     Education   Cardiac Education Topics Pritikin   Hospital doctor Education   General Education Metabolic Syndrome and Belly Fat   Instruction Review Code 1- Verbalizes Understanding   Class Start Time [phone]   Class Stop Time 0900   Class Time Calculation (min) 43 min      Core Videos: Exercise    Move It!  Clinical staff conducted group or individual video education with verbal and written material and  guidebook.  Patient learns the recommended Pritikin exercise program. Exercise with the goal of living a long, healthy life. Some of the health benefits of exercise include controlled diabetes, healthier blood pressure levels, improved cholesterol levels, improved heart and lung capacity, improved sleep, and better body composition. Everyone should speak with their doctor before starting or changing an exercise routine.  Biomechanical Limitations Clinical staff conducted group or individual video education with verbal and written material and guidebook.  Patient learns how biomechanical limitations can impact exercise and how we can mitigate and possibly overcome limitations to have an impactful and balanced exercise routine.  Body Composition Clinical staff conducted group or individual video education with verbal and written material and guidebook.  Patient learns that body composition (ratio of  muscle mass to fat mass) is a key component to assessing overall fitness, rather than body weight alone. Increased fat mass, especially visceral belly fat, can put us  at increased risk for metabolic syndrome, type 2 diabetes, heart disease, and even death. It is recommended to combine diet and exercise (cardiovascular and resistance training) to improve your body composition. Seek guidance from your physician and exercise physiologist before implementing an exercise routine.  Exercise Action Plan Clinical staff conducted group or individual video education with verbal and written material and guidebook.  Patient learns the recommended strategies to achieve and enjoy long-term exercise adherence, including variety, self-motivation, self-efficacy, and positive decision making. Benefits of exercise include fitness, good health, weight management, more energy, better sleep, less stress, and overall well-being.  Medical   Heart Disease Risk Reduction Clinical staff conducted group or individual video education  with verbal and written material and guidebook.  Patient learns our heart is our most vital organ as it circulates oxygen, nutrients, white blood cells, and hormones throughout the entire body, and carries waste away. Data supports a plant-based eating plan like the Pritikin Program for its effectiveness in slowing progression of and reversing heart disease. The video provides a number of recommendations to address heart disease.   Metabolic Syndrome and Belly Fat  Clinical staff conducted group or individual video education with verbal and written material and guidebook.  Patient learns what metabolic syndrome is, how it leads to heart disease, and how one can reverse it and keep it from coming back. You have metabolic syndrome if you have 3 of the following 5 criteria: abdominal obesity, high blood pressure, high triglycerides, low HDL cholesterol, and high blood sugar.  Hypertension and Heart Disease Clinical staff conducted group or individual video education with verbal and written material and guidebook.  Patient learns that high blood pressure, or hypertension, is very common in the [place] . Hypertension is largely due to excessive salt intake, but other important risk factors include being overweight, physical inactivity, drinking too much alcohol, smoking, and not eating enough potassium from fruits and vegetables. High blood pressure is a leading risk factor for heart attack, stroke, congestive heart failure, dementia, kidney failure, and premature death. Long-term effects of excessive salt intake include stiffening of the arteries and thickening of heart muscle and organ damage. Recommendations include ways to reduce hypertension and the risk of heart disease.  Diseases of Our Time - Focusing on Diabetes Clinical staff conducted group or individual video education with verbal and written material and guidebook.  Patient learns why the best way to stop diseases of our time is prevention,  through food and other lifestyle changes. Medicine (such as prescription pills and surgeries) is often only a Band-Aid on the problem, not a long-term solution. Most common diseases of our time include obesity, type 2 diabetes, hypertension, heart disease, and cancer. The Pritikin Program is recommended and has been proven to help reduce, reverse, and/or prevent the damaging effects of metabolic syndrome.  Nutrition   Overview of the Pritikin Eating Plan  Clinical staff conducted group or individual video education with verbal and written material and guidebook.  Patient learns about the Pritikin Eating Plan for disease risk reduction. The Pritikin Eating Plan emphasizes a wide variety of unrefined, minimally-processed carbohydrates, like fruits, vegetables, whole grains, and legumes. Go, Caution, and Stop food choices are explained. Plant-based and lean animal proteins are emphasized. Rationale provided for low sodium intake for blood pressure control, low added sugars for blood sugar stabilization,  and low added fats and oils for coronary artery disease risk reduction and weight management.  Calorie Density  Clinical staff conducted group or individual video education with verbal and written material and guidebook.  Patient learns about calorie density and how it impacts the Pritikin Eating Plan. Knowing the characteristics of the food you choose will help you decide whether those foods will lead to weight gain or weight loss, and whether you want to consume more or less of them. Weight loss is usually a side effect of the Pritikin Eating Plan because of its focus on low calorie-dense foods.  Label Reading  Clinical staff conducted group or individual video education with verbal and written material and guidebook.  Patient learns about the Pritikin recommended label reading guidelines and corresponding recommendations regarding calorie density, added sugars, sodium content, and whole grains.  Dining  Out - Part 1  Clinical staff conducted group or individual video education with verbal and written material and guidebook.  Patient learns that restaurant meals can be sabotaging because they can be so high in calories, fat, sodium, and/or sugar. Patient learns recommended strategies on how to positively address this and avoid unhealthy pitfalls.  Facts on Fats  Clinical staff conducted group or individual video education with verbal and written material and guidebook.  Patient learns that lifestyle modifications can be just as effective, if not more so, as many medications for lowering your risk of heart disease. A Pritikin lifestyle can help to reduce your risk of inflammation and atherosclerosis (cholesterol build-up, or plaque, in the artery walls). Lifestyle interventions such as dietary choices and physical activity address the cause of atherosclerosis. A review of the types of fats and their impact on blood cholesterol levels, along with dietary recommendations to reduce fat intake is also included.  Nutrition Action Plan  Clinical staff conducted group or individual video education with verbal and written material and guidebook.  Patient learns how to incorporate Pritikin recommendations into their lifestyle. Recommendations include planning and keeping personal health goals in mind as an important part of their success.  Healthy Mind-Set    Healthy Minds, Bodies, Hearts  Clinical staff conducted group or individual video education with verbal and written material and guidebook.  Patient learns how to identify when they are stressed. Video will discuss the impact of that stress, as well as the many benefits of stress management. Patient will also be introduced to stress management techniques. The way we think, act, and feel has an impact on our hearts.  How Our Thoughts Can Heal Our Hearts  Clinical staff conducted group or individual video education with verbal and written material and  guidebook.  Patient learns that negative thoughts can cause depression and anxiety. This can result in negative lifestyle behavior and serious health problems. Cognitive behavioral therapy is an effective method to help control our thoughts in order to change and improve our emotional outlook.  Additional Videos:  Exercise    Improving Performance  Clinical staff conducted group or individual video education with verbal and written material and guidebook.  Patient learns to use a non-linear approach by alternating intensity levels and lengths of time spent exercising to help burn more calories and lose more body fat. Cardiovascular exercise helps improve heart health, metabolism, hormonal balance, blood sugar control, and recovery from fatigue. Resistance training improves strength, endurance, balance, coordination, reaction time, metabolism, and muscle mass. Flexibility exercise improves circulation, posture, and balance. Seek guidance from your physician and exercise physiologist before implementing an exercise  routine and learn your capabilities and proper form for all exercise.  Introduction to Yoga  Clinical staff conducted group or individual video education with verbal and written material and guidebook.  Patient learns about yoga, a discipline of the coming together of mind, breath, and body. The benefits of yoga include improved flexibility, improved range of motion, better posture and core strength, increased lung function, weight loss, and positive self-image. Yoga's heart health benefits include lowered blood pressure, healthier heart rate, decreased cholesterol and triglyceride levels, improved immune function, and reduced stress. Seek guidance from your physician and exercise physiologist before implementing an exercise routine and learn your capabilities and proper form for all exercise.  Medical   Aging: Enhancing Your Quality of Life  Clinical staff conducted group or individual  video education with verbal and written material and guidebook.  Patient learns key strategies and recommendations to stay in good physical health and enhance quality of life, such as prevention strategies, having an advocate, securing a Health Care Proxy and Power of Attorney, and keeping a list of medications and system for tracking them. It also discusses how to avoid risk for bone loss.  Biology of Weight Control  Clinical staff conducted group or individual video education with verbal and written material and guidebook.  Patient learns that weight gain occurs because we consume more calories than we burn (eating more, moving less). Even if your body weight is normal, you may have higher ratios of fat compared to muscle mass. Too much body fat puts you at increased risk for cardiovascular disease, heart attack, stroke, type 2 diabetes, and obesity-related cancers. In addition to exercise, following the Pritikin Eating Plan can help reduce your risk.  Decoding Lab Results  Clinical staff conducted group or individual video education with verbal and written material and guidebook.  Patient learns that lab test reflects one measurement whose values change over time and are influenced by many factors, including medication, stress, sleep, exercise, food, hydration, pre-existing medical conditions, and more. It is recommended to use the knowledge from this video to become more involved with your lab results and evaluate your numbers to speak with your doctor.   Diseases of Our Time - Overview  Clinical staff conducted group or individual video education with verbal and written material and guidebook.  Patient learns that according to the CDC, 50% to 70% of chronic diseases (such as obesity, type 2 diabetes, elevated lipids, hypertension, and heart disease) are avoidable through lifestyle improvements including healthier food choices, listening to satiety cues, and increased physical activity.  Sleep  Disorders Clinical staff conducted group or individual video education with verbal and written material and guidebook.  Patient learns how good quality and duration of sleep are important to overall health and well-being. Patient also learns about sleep disorders and how they impact health along with recommendations to address them, including discussing with a physician.  Nutrition  Dining Out - Part 2 Clinical staff conducted group or individual video education with verbal and written material and guidebook.  Patient learns how to plan ahead and communicate in order to maximize their dining experience in a healthy and nutritious manner. Included are recommended food choices based on the type of restaurant the patient is visiting.   Fueling a Banker conducted group or individual video education with verbal and written material and guidebook.  There is a strong connection between our food choices and our health. Diseases like obesity and type 2 diabetes are very prevalent  and are in large-part due to lifestyle choices. The Pritikin Eating Plan provides plenty of food and hunger-curbing satisfaction. It is easy to follow, affordable, and helps reduce health risks.  Menu Workshop  Clinical staff conducted group or individual video education with verbal and written material and guidebook.  Patient learns that restaurant meals can sabotage health goals because they are often packed with calories, fat, sodium, and sugar. Recommendations include strategies to plan ahead and to communicate with the manager, chef, or server to help order a healthier meal.  Planning Your Eating Strategy  Clinical staff conducted group or individual video education with verbal and written material and guidebook.  Patient learns about the Pritikin Eating Plan and its benefit of reducing the risk of disease. The Pritikin Eating Plan does not focus on calories. Instead, it emphasizes high-quality,  nutrient-rich foods. By knowing the characteristics of the foods, we choose, we can determine their calorie density and make informed decisions.  Targeting Your Nutrition Priorities  Clinical staff conducted group or individual video education with verbal and written material and guidebook.  Patient learns that lifestyle habits have a tremendous impact on disease risk and progression. This video provides eating and physical activity recommendations based on your personal health goals, such as reducing LDL cholesterol, losing weight, preventing or controlling type 2 diabetes, and reducing high blood pressure.  Vitamins and Minerals  Clinical staff conducted group or individual video education with verbal and written material and guidebook.  Patient learns different ways to obtain key vitamins and minerals, including through a recommended healthy diet. It is important to discuss all supplements you take with your doctor.   Healthy Mind-Set    Smoking Cessation  Clinical staff conducted group or individual video education with verbal and written material and guidebook.  Patient learns that cigarette smoking and tobacco addiction pose a serious health risk which affects millions of people. Stopping smoking will significantly reduce the risk of heart disease, lung disease, and many forms of cancer. Recommended strategies for quitting are covered, including working with your doctor to develop a successful plan.  Culinary   Becoming a Set designer conducted group or individual video education with verbal and written material and guidebook.  Patient learns that cooking at home can be healthy, cost-effective, quick, and puts them in control. Keys to cooking healthy recipes will include looking at your recipe, assessing your equipment needs, planning ahead, making it simple, choosing cost-effective seasonal ingredients, and limiting the use of added fats, salts, and sugars.  Cooking -  Breakfast and Snacks  Clinical staff conducted group or individual video education with verbal and written material and guidebook.  Patient learns how important breakfast is to satiety and nutrition through the entire day. Recommendations include key foods to eat during breakfast to help stabilize blood sugar levels and to prevent overeating at meals later in the day. Planning ahead is also a key component.  Cooking - Educational psychologist conducted group or individual video education with verbal and written material and guidebook.  Patient learns eating strategies to improve overall health, including an approach to cook more at home. Recommendations include thinking of animal protein as a side on your plate rather than center stage and focusing instead on lower calorie dense options like vegetables, fruits, whole grains, and plant-based proteins, such as beans. Making sauces in large quantities to freeze for later and leaving the skin on your vegetables are also recommended to maximize your experience.  Cooking - Healthy Salads and Dressing Clinical staff conducted group or individual video education with verbal and written material and guidebook.  Patient learns that vegetables, fruits, whole grains, and legumes are the foundations of the Pritikin Eating Plan. Recommendations include how to incorporate each of these in flavorful and healthy salads, and how to create homemade salad dressings. Proper handling of ingredients is also covered. Cooking - Soups and [employer] - Soups and Desserts Clinical staff conducted group or individual video education with verbal and written material and guidebook.  Patient learns that Pritikin soups and desserts make for easy, nutritious, and delicious snacks and meal components that are low in sodium, fat, sugar, and calorie density, while high in vitamins, minerals, and filling fiber. Recommendations include simple and healthy ideas for soups and  desserts.   Overview     The Pritikin Solution Program Overview Clinical staff conducted group or individual video education with verbal and written material and guidebook.  Patient learns that the results of the Pritikin Program have been documented in more than 100 articles published in peer-reviewed journals, and the benefits include reducing risk factors for (and, in some cases, even reversing) high cholesterol, high blood pressure, type 2 diabetes, obesity, and more! An overview of the three key pillars of the Pritikin Program will be covered: eating well, doing regular exercise, and having a healthy mind-set.  WORKSHOPS  Exercise: Exercise Basics: Building Your Action Plan Clinical staff led group instruction and group discussion with PowerPoint presentation and patient guidebook. To enhance the learning environment the use of posters, models and videos may be added. At the conclusion of this workshop, patients will comprehend the difference between physical activity and exercise, as well as the benefits of incorporating both, into their routine. Patients will understand the FITT (Frequency, Intensity, Time, and Type) principle and how to use it to build an exercise action plan. In addition, safety concerns and other considerations for exercise and cardiac rehab will be addressed by the presenter. The purpose of this lesson is to promote a comprehensive and effective weekly exercise routine in order to improve patients' overall level of fitness.   Managing Heart Disease: Your Path to a Healthier Heart Clinical staff led group instruction and group discussion with PowerPoint presentation and patient guidebook. To enhance the learning environment the use of posters, models and videos may be added.At the conclusion of this workshop, patients will understand the anatomy and physiology of the heart. Additionally, they will understand how Pritikin's three pillars impact the risk factors, the  progression, and the management of heart disease.  The purpose of this lesson is to provide a high-level overview of the heart, heart disease, and how the Pritikin lifestyle positively impacts risk factors.  Exercise Biomechanics Clinical staff led group instruction and group discussion with PowerPoint presentation and patient guidebook. To enhance the learning environment the use of posters, models and videos may be added. Patients will learn how the structural parts of their bodies function and how these functions impact their daily activities, movement, and exercise. Patients will learn how to promote a neutral spine, learn how to manage pain, and identify ways to improve their physical movement in order to promote healthy living. The purpose of this lesson is to expose patients to common physical limitations that impact physical activity. Participants will learn practical ways to adapt and manage aches and pains, and to minimize their effect on regular exercise. Patients will learn how to maintain good posture while sitting, walking, and  lifting.  Balance Training and Fall Prevention  Clinical staff led group instruction and group discussion with PowerPoint presentation and patient guidebook. To enhance the learning environment the use of posters, models and videos may be added. At the conclusion of this workshop, patients will understand the importance of their sensorimotor skills (vision, proprioception, and the vestibular system) in maintaining their ability to balance as they age. Patients will apply a variety of balancing exercises that are appropriate for their current level of function. Patients will understand the common causes for poor balance, possible solutions to these problems, and ways to modify their physical environment in order to minimize their fall risk. The purpose of this lesson is to teach patients about the importance of maintaining balance as they age and ways to  minimize their risk of falling.  WORKSHOPS   Nutrition:  Fueling a Ship broker led group instruction and group discussion with PowerPoint presentation and patient guidebook. To enhance the learning environment the use of posters, models and videos may be added. Patients will review the foundational principles of the Pritikin Eating Plan and understand what constitutes a serving size in each of the food groups. Patients will also learn Pritikin-friendly foods that are better choices when away from home and review make-ahead meal and snack options. Calorie density will be reviewed and applied to three nutrition priorities: weight maintenance, weight loss, and weight gain. The purpose of this lesson is to reinforce (in a group setting) the key concepts around what patients are recommended to eat and how to apply these guidelines when away from home by planning and selecting Pritikin-friendly options. Patients will understand how calorie density may be adjusted for different weight management goals.  Mindful Eating  Clinical staff led group instruction and group discussion with PowerPoint presentation and patient guidebook. To enhance the learning environment the use of posters, models and videos may be added. Patients will briefly review the concepts of the Pritikin Eating Plan and the importance of low-calorie dense foods. The concept of mindful eating will be introduced as well as the importance of paying attention to internal hunger signals. Triggers for non-hunger eating and techniques for dealing with triggers will be explored. The purpose of this lesson is to provide patients with the opportunity to review the basic principles of the Pritikin Eating Plan, discuss the value of eating mindfully and how to measure internal cues of hunger and fullness using the Hunger Scale. Patients will also discuss reasons for non-hunger eating and learn strategies to use for controlling emotional  eating.  Targeting Your Nutrition Priorities Clinical staff led group instruction and group discussion with PowerPoint presentation and patient guidebook. To enhance the learning environment the use of posters, models and videos may be added. Patients will learn how to determine their genetic susceptibility to disease by reviewing their family history. Patients will gain insight into the importance of diet as part of an overall healthy lifestyle in mitigating the impact of genetics and other environmental insults. The purpose of this lesson is to provide patients with the opportunity to assess their personal nutrition priorities by looking at their family history, their own health history and current risk factors. Patients will also be able to discuss ways of prioritizing and modifying the Pritikin Eating Plan for their highest risk areas  Menu  Clinical staff led group instruction and group discussion with PowerPoint presentation and patient guidebook. To enhance the learning environment the use of posters, models and videos may be added. Using menus  brought in from [employer], or printed from [employer], patients will apply the Pritikin dining out guidelines that were presented in the [employer] video. Patients will also be able to practice these guidelines in a variety of provided scenarios. The purpose of this lesson is to provide patients with the opportunity to practice hands-on learning of the Pritikin Dining Out guidelines with actual menus and practice scenarios.  Label Reading Clinical staff led group instruction and group discussion with PowerPoint presentation and patient guidebook. To enhance the learning environment the use of posters, models and videos may be added. Patients will review and discuss the Pritikin label reading guidelines presented in Pritikin's Label Reading Educational series video. Using fool labels brought in from local grocery stores and  markets, patients will apply the label reading guidelines and determine if the packaged food meet the Pritikin guidelines. The purpose of this lesson is to provide patients with the opportunity to review, discuss, and practice hands-on learning of the Pritikin Label Reading guidelines with actual packaged food labels. Cooking School  Pritikin's [employer] are designed to teach patients ways to prepare quick, simple, and affordable recipes at home. The importance of nutrition's role in chronic disease risk reduction is reflected in its emphasis in the overall Pritikin program. By learning how to prepare essential core Pritikin Eating Plan recipes, patients will increase control over what they eat; be able to customize the flavor of foods without the use of added salt, sugar, or fat; and improve the quality of the food they consume. By learning a set of core recipes which are easily assembled, quickly prepared, and affordable, patients are more likely to prepare more healthy foods at home. These workshops focus on convenient breakfasts, simple entres, side dishes, and desserts which can be prepared with minimal effort and are consistent with nutrition recommendations for cardiovascular risk reduction. Cooking [employer] are taught by a Armed forces logistics/support/administrative officer (RD) who has been trained by the [employer]. The chef or RD has a clear understanding of the importance of minimizing - if not completely eliminating - added fat, sugar, and sodium in recipes. Throughout the series of Cooking School Workshop sessions, patients will learn about healthy ingredients and efficient methods of cooking to build confidence in their capability to prepare    Cooking School weekly topics:  Adding Flavor- Sodium-Free  Fast and Healthy Breakfasts  Powerhouse Plant-Based Proteins  Satisfying Salads and Dressings  Simple Sides and Sauces  International Cuisine-Spotlight on the [employer]  Zones  Delicious Desserts  Savory Soups  [employer] - Meals in a Astronomer Appetizers and Snacks  Comforting Weekend Breakfasts  One-Pot Wonders   Fast Evening Meals  Landscape architect Your Pritikin Plate  WORKSHOPS   Healthy Mindset (Psychosocial):  Focused Goals, Sustainable Changes Clinical staff led group instruction and group discussion with PowerPoint presentation and patient guidebook. To enhance the learning environment the use of posters, models and videos may be added. Patients will be able to apply effective goal setting strategies to establish at least one personal goal, and then take consistent, meaningful action toward that goal. They will learn to identify common barriers to achieving personal goals and develop strategies to overcome them. Patients will also gain an understanding of how our mind-set can impact our ability to achieve goals and the importance of cultivating a positive and growth-oriented mind-set. The purpose of this lesson is to provide patients with a deeper understanding of how  to set and achieve personal goals, as well as the tools and strategies needed to overcome common obstacles which may arise along the way.  From Head to Heart: The Power of a Healthy Outlook  Clinical staff led group instruction and group discussion with PowerPoint presentation and patient guidebook. To enhance the learning environment the use of posters, models and videos may be added. Patients will be able to recognize and describe the impact of emotions and mood on physical health. They will discover the importance of self-care and explore self-care practices which may work for them. Patients will also learn how to utilize the 4 C's to cultivate a healthier outlook and better manage stress and challenges. The purpose of this lesson is to demonstrate to patients how a healthy outlook is an essential part of maintaining good health, especially as they continue their  cardiac rehab journey.  Healthy Sleep for a Healthy Heart Clinical staff led group instruction and group discussion with PowerPoint presentation and patient guidebook. To enhance the learning environment the use of posters, models and videos may be added. At the conclusion of this workshop, patients will be able to demonstrate knowledge of the importance of sleep to overall health, well-being, and quality of life. They will understand the symptoms of, and treatments for, common sleep disorders. Patients will also be able to identify daytime and nighttime behaviors which impact sleep, and they will be able to apply these tools to help manage sleep-related challenges. The purpose of this lesson is to provide patients with a general overview of sleep and outline the importance of quality sleep. Patients will learn about a few of the most common sleep disorders. Patients will also be introduced to the concept of "sleep hygiene," and discover ways to self-manage certain sleeping problems through simple daily behavior changes. Finally, the workshop will motivate patients by clarifying the links between quality sleep and their goals of heart-healthy living.   Recognizing and Reducing Stress Clinical staff led group instruction and group discussion with PowerPoint presentation and patient guidebook. To enhance the learning environment the use of posters, models and videos may be added. At the conclusion of this workshop, patients will be able to understand the types of stress reactions, differentiate between acute and chronic stress, and recognize the impact that chronic stress has on their health. They will also be able to apply different coping mechanisms, such as reframing negative self-talk. Patients will have the opportunity to practice a variety of stress management techniques, such as deep abdominal breathing, progressive muscle relaxation, and/or guided imagery.  The purpose of this lesson is to educate  patients on the role of stress in their lives and to provide healthy techniques for coping with it.  Learning Barriers/Preferences:  Learning Barriers/Preferences - 09/05/23 1400       Learning Barriers/Preferences   Learning Barriers Sight   glasses   Learning Preferences Audio;Computer/Internet;Skilled Demonstration;Group Instruction;Individual Instruction;Pictoral;Verbal Instruction;Video;Written Material          Education Topics:  Knowledge Questionnaire Score:  Knowledge Questionnaire Score - 09/05/23 1411       Knowledge Questionnaire Score   Pre Score 18/24          Core Components/Risk Factors/Patient Goals at Admission:  Personal Goals and Risk Factors at Admission - 09/05/23 1359       Core Components/Risk Factors/Patient Goals on Admission    Weight Management Yes;Obesity;Weight Loss    Intervention Weight Management: Develop a combined nutrition and exercise program designed to reach desired caloric intake,  while maintaining appropriate intake of nutrient and fiber, sodium and fats, and appropriate energy expenditure required for the weight goal.;Weight Management: Provide education and appropriate resources to help participant work on and attain dietary goals.;Weight Management/Obesity: Establish reasonable short term and long term weight goals.;Obesity: Provide education and appropriate resources to help participant work on and attain dietary goals.    Admit Weight 326 lb 8 oz (148.1 kg)    Goal Weight: Long Term 226 lb (102.5 kg)   pt goal   Diabetes Yes    Intervention Provide education about signs/symptoms and action to take for hypo/hyperglycemia.;Provide education about proper nutrition, including hydration, and aerobic/resistive exercise prescription along with prescribed medications to achieve blood glucose in normal ranges: Fasting glucose 65-99 mg/dL    Expected Outcomes Short Term: Participant verbalizes understanding of the signs/symptoms and immediate  care of hyper/hypoglycemia, proper foot care and importance of medication, aerobic/resistive exercise and nutrition plan for blood glucose control.;Long Term: Attainment of HbA1C < 7%.    Heart Failure Yes    Intervention Provide a combined exercise and nutrition program that is supplemented with education, support and counseling about heart failure. Directed toward relieving symptoms such as shortness of breath, decreased exercise tolerance, and extremity edema.    Expected Outcomes Improve functional capacity of life;Short term: Attendance in program 2-3 days a week with increased exercise capacity. Reported lower sodium intake. Reported increased fruit and vegetable intake. Reports medication compliance.;Short term: Daily weights obtained and reported for increase. Utilizing diuretic protocols set by physician.;Long term: Adoption of self-care skills and reduction of barriers for early signs and symptoms recognition and intervention leading to self-care maintenance.    Hypertension Yes    Intervention Provide education on lifestyle modifcations including regular physical activity/exercise, weight management, moderate sodium restriction and increased consumption of fresh fruit, vegetables, and low fat dairy, alcohol moderation, and smoking cessation.;Monitor prescription use compliance.    Expected Outcomes Long Term: Maintenance of blood pressure at goal levels.;Short Term: Continued assessment and intervention until BP is < 140/68mm HG in hypertensive participants. < 130/56mm HG in hypertensive participants with diabetes, heart failure or chronic kidney disease.    Lipids Yes    Intervention Provide education and support for participant on nutrition & aerobic/resistive exercise along with prescribed medications to achieve LDL 70mg , HDL >40mg .    Expected Outcomes Short Term: Participant states understanding of desired cholesterol values and is compliant with medications prescribed. Participant is  following exercise prescription and nutrition guidelines.;Long Term: Cholesterol controlled with medications as prescribed, with individualized exercise RX and with personalized nutrition plan. Value goals: LDL < 70mg , HDL > 40 mg.          Core Components/Risk Factors/Patient Goals Review:   Goals and Risk Factor Review     Row Name 09/16/23 1649 10/22/23 1817           Core Components/Risk Factors/Patient Goals Review   Personal Goals Review Weight Management/Obesity;Heart Failure;Lipids;Hypertension;Diabetes Weight Management/Obesity;Heart Failure;Lipids;Hypertension;Diabetes      Review Pt started cardiac rehab on 09/16/23. Pt is diet-controlled diabetic.  Moderate systolic BP elevation noted during exercise. Alex [name] is doing well with exercise at cardiac rehab. Vital signs have been stable. Alex [name] has lost 1.3 kg since starting cardiac rehab.      Expected Outcomes Pt will continue to participate in cardiac rehab for exercise, nutrition, and lifestyle modifications. Pt will continue to participate in cardiac rehab for exercise, nutrition, and lifestyle modifications.         Core Components/Risk Factors/Patient  Goals at Discharge (Final Review):   Goals and Risk Factor Review - 10/22/23 1817       Core Components/Risk Factors/Patient Goals Review   Personal Goals Review Weight Management/Obesity;Heart Failure;Lipids;Hypertension;Diabetes    Review Alex [name] is doing well with exercise at cardiac rehab. Vital signs have been stable. Alex [name] has lost 1.3 kg since starting cardiac rehab.    Expected Outcomes Pt will continue to participate in cardiac rehab for exercise, nutrition, and lifestyle modifications.          ITP Comments:  ITP Comments     Row Name 09/05/23 1253 09/16/23 1645 10/22/23 1815       ITP Comments Dr. Wilbert Bihari medical director. Introduction to pritikin education/intensive cardiac rehab. Initial orientation packet reviewed with patient. 30 Day ITP review. Alex [name]  started cardiac rehab on 09/16/23. Pt did fair with exercise.  Pt is somewhat deconditioned. 30 Day ITP review. Alex [name] has good participation in cardiac rehab when in attendance.        Comments: See ITP comments.[name] RN BSN

## 2023-10-23 ENCOUNTER — Encounter (HOSPITAL_COMMUNITY): Payer: Medicare (Managed Care)

## 2023-10-23 ENCOUNTER — Encounter (HOSPITAL_COMMUNITY)
Admission: RE | Admit: 2023-10-23 | Discharge: 2023-10-23 | Disposition: A | Discharge status: Home / Self Care | Payer: Medicare (Managed Care) | Source: Ambulatory Visit | Attending: Cardiovascular Disease | Admitting: Cardiovascular Disease

## 2023-10-23 DIAGNOSIS — Z955 Presence of coronary angioplasty implant and graft: Secondary | ICD-10-CM | POA: Diagnosis not present

## 2023-10-25 ENCOUNTER — Encounter (HOSPITAL_COMMUNITY)
Admission: RE | Admit: 2023-10-25 | Discharge: 2023-10-25 | Disposition: A | Discharge status: Home / Self Care | Source: Ambulatory Visit | Attending: Cardiovascular Disease | Admitting: Cardiovascular Disease

## 2023-10-25 ENCOUNTER — Encounter (HOSPITAL_COMMUNITY): Payer: Medicare (Managed Care)

## 2023-10-25 DIAGNOSIS — Z955 Presence of coronary angioplasty implant and graft: Secondary | ICD-10-CM | POA: Diagnosis not present

## 2023-10-28 ENCOUNTER — Ambulatory Visit (INDEPENDENT_AMBULATORY_CARE_PROVIDER_SITE_OTHER)

## 2023-10-28 ENCOUNTER — Encounter (HOSPITAL_COMMUNITY): Payer: Medicare (Managed Care)

## 2023-10-28 ENCOUNTER — Encounter (HOSPITAL_COMMUNITY)
Admission: RE | Admit: 2023-10-28 | Discharge: 2023-10-28 | Disposition: A | Discharge status: Home / Self Care | Source: Ambulatory Visit | Attending: Cardiovascular Disease

## 2023-10-28 VITALS — BP 128/87 | HR 59 | Ht 67.0 in | Wt 321.2 lb

## 2023-10-28 DIAGNOSIS — M1A0290 Idiopathic chronic gout, unspecified elbow, without tophus (tophi): Secondary | ICD-10-CM | POA: Diagnosis not present

## 2023-10-28 DIAGNOSIS — L237 Allergic contact dermatitis due to plants, except food: Secondary | ICD-10-CM

## 2023-10-28 DIAGNOSIS — Z955 Presence of coronary angioplasty implant and graft: Secondary | ICD-10-CM

## 2023-10-28 MED ORDER — COLCHICINE 0.6 MG PO TABS: 0.6000 mg | ORAL_TABLET | Freq: Every day | ORAL | Status: AC | PRN

## 2023-10-28 MED ORDER — PREDNISONE 10 MG PO TABS: ORAL_TABLET | ORAL | 0 refills | Status: AC

## 2023-10-28 NOTE — Patient Instructions (Signed)
 Thank you for visiting the clinic today, it was good to see you!  Please always bring your medication bottles  In today's visit we discussed:  Poison Ivy: I have prescribed a steroid taper for you to take over the course of 7 days. Take the anti-histamine 2 pills every day until the rash has resolved. Other soothing treatments include the oat meal sock in a cool bath and the calomel lotion. Avoid putting the kenalog  ointment on your face. If you are on the last days of the taper and the rash is not improving contact me in my chart and we can increase the steroid taper.  Please follow-up as needed  For any questions, please call the office at [phone] or send me a message in MyChart. Have a great day!  -Fairy Amy, MD  [facility] Resident, PGY-1

## 2023-10-28 NOTE — Progress Notes (Signed)
 Patient says that he has poison oak on his face and chest. Mr Alex [name] says he will not exercise today and plans to return to exercise on Wednesday. Mr Alex [name] hopes to return to exercise on Wednesday.[name] RN BSN

## 2023-10-28 NOTE — Progress Notes (Signed)
    SUBJECTIVE:   CHIEF COMPLAINT / HPI:   Contact dermatitis: Started two weeks ago after cutting down bushes in his backyard. New there was some poison oak back there but didn't notice he was getting into it. Rash started on hands with itchiness one or two days after yard work. No vesicles or pustules, then spread to the face and chest. Has used some calomile lotion helped sooth, put some kenalog  ointment on his face but did not help much. Has had poison ivy exposure in the past. No fevers, chills, cough, SOB, CP, N/V, fatigue, new medications, or change myalgias.  PERTINENT  PMH / PSH: Gout, History of TIA, history of GI diverticular bleed, GERD  OBJECTIVE:   BP 128/87   Pulse (!) 59   Ht 5' 7 (1.702 m)   Wt (!) 321 lb 3.2 oz (145.7 kg)   SpO2 99%   BMI 50.31 kg/m    Cardiac: Regular rate and rhythm. Normal S1/S2. No murmurs, rubs, or gallops appreciated. Lungs: Clear bilaterally to ascultation.  Abdomen: Normoactive bowel sounds. No tenderness to deep or light palpation. No rebound or guarding.  Derm: See attached photo, several erythematous, non-raised lesions on chest, neck, and face with small urticarial vesicle. Numerous skin tags on neck creases and R flank Psych: Pleasant and appropriate    ASSESSMENT/PLAN:   Assessment & Plan Contact dermatitis due to poison ivy Given chronicity of symptoms, this is the leading diagnosis. No URI or other systemic symptoms making immunological causes (SLE, medication reaction, psoriasis, etc) less probable. -Prednisone  taper for 7 days 50 mg-10 mg. 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 -Colchicine  refilled     Alex Amy, MD [facility]

## 2023-10-28 NOTE — Assessment & Plan Note (Signed)
 Well controlled, ran out of colchicine  for gout flares. -Colchicine  refilled

## 2023-10-30 ENCOUNTER — Encounter (HOSPITAL_COMMUNITY)
Admission: RE | Admit: 2023-10-30 | Discharge: 2023-10-30 | Disposition: A | Discharge status: Home / Self Care | Source: Ambulatory Visit | Attending: Cardiovascular Disease | Admitting: Cardiovascular Disease

## 2023-10-30 ENCOUNTER — Encounter (HOSPITAL_COMMUNITY): Payer: Medicare (Managed Care)

## 2023-10-30 DIAGNOSIS — Z955 Presence of coronary angioplasty implant and graft: Secondary | ICD-10-CM

## 2023-11-01 ENCOUNTER — Encounter (HOSPITAL_COMMUNITY)
Admission: RE | Admit: 2023-11-01 | Discharge: 2023-11-01 | Disposition: A | Discharge status: Home / Self Care | Source: Ambulatory Visit | Attending: Cardiovascular Disease | Admitting: Cardiovascular Disease

## 2023-11-01 ENCOUNTER — Encounter (HOSPITAL_COMMUNITY): Payer: Medicare (Managed Care)

## 2023-11-01 DIAGNOSIS — Z955 Presence of coronary angioplasty implant and graft: Secondary | ICD-10-CM

## 2023-11-03 NOTE — Progress Notes (Unsigned)
    SUBJECTIVE:   Chief compliant/HPI: annual examination  Alex [name] is a 68 y.o. who presents today for an annual exam.    A1c, UACR   History tabs reviewed and updated ***.   Review of systems form reviewed and notable for ***.   OBJECTIVE:   There were no vitals taken for this visit.  ***  ASSESSMENT/PLAN:   Assessment & Plan  Annual Examination  See AVS for age appropriate recommendations.  PHQ score ***, reviewed and discussed.  Blood pressure value is *** goal, discussed.   Considered the following screening exams based upon USPSTF recommendations:  Lung cancer screening: {discussed/declined/written pwqn:80301} See documentation below regarding discussion and indication.  PSA discussed and after engaging in discussion of possible risks, benefits and complications of screening patient elected to ***.   Follow up in 1 year or sooner if indicated.  MyChart Activation: {MYCHARTLIST:32522}   You are due for a diabetic eye exam with your eye doctor (ophthalmologist). This is to prevent vision problems such as blindness from diabetes. Please call your eye doctor to make an appointment. Let us  know if you need a referral and I can send that for you.    Twyla Nearing, MD [facility]

## 2023-11-04 ENCOUNTER — Encounter (HOSPITAL_COMMUNITY): Payer: Medicare (Managed Care)

## 2023-11-04 ENCOUNTER — Encounter (HOSPITAL_COMMUNITY)

## 2023-11-04 ENCOUNTER — Ambulatory Visit (INDEPENDENT_AMBULATORY_CARE_PROVIDER_SITE_OTHER): Payer: Self-pay | Admitting: Family Medicine

## 2023-11-04 VITALS — BP 130/78 | HR 69 | Wt 324.0 lb

## 2023-11-04 DIAGNOSIS — E119 Type 2 diabetes mellitus without complications: Secondary | ICD-10-CM | POA: Diagnosis not present

## 2023-11-04 LAB — POCT GLYCOSYLATED HEMOGLOBIN (HGB A1C): HbA1c, POC (controlled diabetic range): 6.4 % (ref 0.0–7.0)

## 2023-11-04 NOTE — Patient Instructions (Signed)
 Good to see you today - Thank you for coming in  Things we discussed today:  1) For your energy, I recommend keeping up with regular exercises to keep your heart stent healthy and working well.  - Look up the [employer] program, which can provide free access to a gym  2) Continue wearing your CPAP nightly  3) We will start you on jardiance to help protect your heart and keep your kidneys healthy.  - Take jardiance once a day by mouth  4) You are due for a diabetic eye exam with your eye doctor (ophthalmologist). This is to prevent vision problems such as blindness from diabetes. Please call your eye doctor to make an appointment. Let us  know if you need a referral and I can send that for you.   Come back to see me in 3 months, we will recheck your A1c.

## 2023-11-05 LAB — MICROALBUMIN / CREATININE URINE RATIO
Creatinine, Urine: 93.7 mg/dL
Microalb/Creat Ratio: 33 mg/g{creat} — ABNORMAL HIGH (ref 0–29)
Microalbumin, Urine: 31.1 ug/mL

## 2023-11-05 NOTE — Assessment & Plan Note (Signed)
 A1c 6.4, at goal.  Diet controlled -UACR today  - Provided information for Silver sneakers.  Encouraged to continue regular exercise - Counseled to schedule diabetic eye exam

## 2023-11-06 ENCOUNTER — Encounter (HOSPITAL_COMMUNITY): Payer: Medicare (Managed Care)

## 2023-11-06 ENCOUNTER — Encounter (HOSPITAL_COMMUNITY)
Admission: RE | Admit: 2023-11-06 | Discharge: 2023-11-06 | Disposition: A | Discharge status: Home / Self Care | Source: Ambulatory Visit | Attending: Cardiovascular Disease

## 2023-11-06 DIAGNOSIS — Z955 Presence of coronary angioplasty implant and graft: Secondary | ICD-10-CM | POA: Diagnosis not present

## 2023-11-06 NOTE — Progress Notes (Signed)
 Reviewed home exercise Rx with patient today.  Encouraged warm-up, cool-down, and stretching. Reviewed THRR of  61-122 and keeping RPE between 11-13. Encouraged to hydrate with activity.  ?Reviewed weather parameters for temperature and humidity for safe exercise outdoors. Reviewed S/S to terminate exercise and when to call 911 vs MD. Reviewed the use of NTG and pt was encouraged to carry at all times. Pt encouraged to always carry a cell phone for safety when exercising outdoors. Pt verbalized understanding of the home exercise Rx and was provided a copy.  ? ?Lorin Picket MS, ACSM-CEP, CCRP  ?

## 2023-11-07 ENCOUNTER — Telehealth: Payer: Self-pay

## 2023-11-07 NOTE — Telephone Encounter (Signed)
 Patient calls nurse line regarding order for CPAP supplies.   He reports that he is needing face mask and tubing. States that [employer] [facility] has faxed the order form to our office.  Form was not in PCP box.   Dr. Zheng- have you received this? If not, I can call [employer] and have order form faxed again.   Chiquita JAYSON English, RN

## 2023-11-08 ENCOUNTER — Encounter (HOSPITAL_COMMUNITY): Payer: Medicare (Managed Care)

## 2023-11-08 ENCOUNTER — Encounter (HOSPITAL_COMMUNITY)
Admission: RE | Admit: 2023-11-08 | Discharge: 2023-11-08 | Disposition: A | Discharge status: Home / Self Care | Source: Ambulatory Visit | Attending: Cardiovascular Disease | Admitting: Cardiovascular Disease

## 2023-11-08 DIAGNOSIS — Z955 Presence of coronary angioplasty implant and graft: Secondary | ICD-10-CM | POA: Diagnosis not present

## 2023-11-13 ENCOUNTER — Encounter (HOSPITAL_COMMUNITY): Payer: Medicare (Managed Care)

## 2023-11-13 ENCOUNTER — Encounter (HOSPITAL_COMMUNITY)
Admission: RE | Admit: 2023-11-13 | Discharge: 2023-11-13 | Disposition: A | Discharge status: Home / Self Care | Source: Ambulatory Visit | Attending: Cardiovascular Disease | Admitting: Cardiovascular Disease

## 2023-11-13 DIAGNOSIS — Z955 Presence of coronary angioplasty implant and graft: Secondary | ICD-10-CM | POA: Diagnosis present

## 2023-11-13 DIAGNOSIS — Z48812 Encounter for surgical aftercare following surgery on the circulatory system: Secondary | ICD-10-CM | POA: Insufficient documentation

## 2023-11-14 NOTE — Progress Notes (Signed)
 Cardiac Individual Treatment Plan  Patient Details  Name: Alex [name] MRN: [id number] Date of Birth: 1955-07-01 Referring Provider:   Flowsheet Row CARDIAC REHAB PHASE II ORIENTATION from 09/05/2023 in [facility]  Referring Provider Lonni Cash, MD    Initial Encounter Date:  Flowsheet Row CARDIAC REHAB PHASE II ORIENTATION from 09/05/2023 in [facility]  Date 09/05/23    Visit Diagnosis: 08/14/23 DES LAD  Patient's Home Medications on Admission:  Current Outpatient Medications:    acetaminophen  (TYLENOL ) 500 MG tablet, Take 500 mg by mouth every 6 (six) hours as needed for moderate pain., Disp: , Rfl:    allopurinol  (ZYLOPRIM ) 100 MG tablet, Take 1 tablet (100 mg total) by mouth daily as needed (gout)., Disp: , Rfl:    amLODipine  (NORVASC ) 10 MG tablet, TAKE 1 TABLET BY MOUTH ONCE DAILY WITH SUPPER, Disp: 90 tablet, Rfl: 0   aspirin  81 MG chewable tablet, Chew 1 tablet (81 mg total) by mouth daily., Disp: 30 tablet, Rfl: 2   colchicine  0.6 MG tablet, Take 1 tablet (0.6 mg total) by mouth daily as needed (gout)., Disp: , Rfl:    Evolocumab  (REPATHA  SURECLICK) 140 MG/ML SOAJ, INJECT 140 MG INTO THE SKIN EVERY 14 DAYS, Disp: 2 mL, Rfl: 11   glucose blood test strip, 1 each by Other route daily. Use as instructed, Disp: 100 each, Rfl: 12   irbesartan  (AVAPRO ) 150 MG tablet, Take 1 tablet (150 mg total) by mouth daily. Please make overdue appt with Dr. Cash before anymore refills  Thank you 3rd and Final attempt, Disp: 15 tablet, Rfl: 0   metoprolol  succinate (TOPROL -XL) 25 MG 24 hr tablet, Take 1 tablet (25 mg total) by mouth daily., Disp: 30 tablet, Rfl: 11   naproxen  (NAPROSYN ) 500 MG tablet, TAKE 1 TABLET BY MOUTH ONCE DAILY AS NEEDED FOR MODERATE PAIN, Disp: 30 tablet, Rfl: 0   nitroGLYCERIN  (NITROSTAT ) 0.4 MG SL tablet, Place 1 tablet (0.4 mg total) under  the tongue every 5 (five) minutes as needed for chest pain., Disp: 25 tablet, Rfl: 3   omeprazole  (PRILOSEC) 40 MG capsule, TAKE 1 CAPSULE BY MOUTH ONCE DAILY WITH SUPPER, Disp: 90 capsule, Rfl: 0   prasugrel  (EFFIENT ) 10 MG TABS tablet, Take 1 tablet (10 mg total) by mouth daily., Disp: 90 tablet, Rfl: 3   triamcinolone  ointment (KENALOG ) 0.5 %, APPLY OINTMENT TOPICALLY TO AFFECTED AREA AS NEEDED, Disp: 15 g, Rfl: 2  Past Medical History: Past Medical History:  Diagnosis Date   Asthma    CAD (coronary artery disease)    Cardiac cath 2002 with 25% distal RCA stenosis.    DDD (degenerative disc disease), lumbar    Diabetes mellitus (HCC)    Diverticulitis    Esophageal stricture    Gastritis 2010   GERD (gastroesophageal reflux disease)    GI bleed    Gout    HTN (hypertension)    Hyperlipidemia    Insomnia    MI (myocardial infarction) (HCC) 2009   no stent placement   OA (osteoarthritis)    Obesity    OSA on CPAP    Seasonal allergies    TIA (transient ischemic attack) 2013    Tobacco Use: Social History   Tobacco Use  Smoking Status Former   Current packs/day: 0.00   Average packs/day: 1.5 packs/day for 10.0 years (15.0 ttl pk-yrs)   Types: Cigarettes   Start  date: 11/05/1981   Quit date: 11/06/1991   Years since quitting: 32.0  Smokeless Tobacco Never  Tobacco Comments   quit 1993    Labs: Review Flowsheet  More data exists      Latest Ref Rng & Units 06/11/2022 06/16/2022 11/13/2022 02/18/2023 11/04/2023  Labs for ITP Cardiac and Pulmonary Rehab  Cholestrol 100 - 199 mg/dL 750  - - 884  -  LDL (calc) 0 - 99 mg/dL 844  - - 50  -  HDL-C >60 mg/dL 30  - - 30  -  Trlycerides 0 - 149 mg/dL 660  - - 786  -  Hemoglobin A1c 0.0 - 7.0 % 6.2  - 6.4  - 6.4   TCO2 22 - 32 mmol/L - 23  - - -    Capillary Blood Glucose: Lab Results  Component Value Date   GLUCAP 100 (H) 10/02/2023   GLUCAP 115 (H) 08/14/2023   GLUCAP 99 09/05/2017   GLUCAP 99 11/27/2012   GLUCAP 88  11/27/2012     Exercise Target Goals: Exercise Program Goal: Individual exercise prescription set using results from initial 6 min walk test and THRR while considering  patient's activity barriers and safety.   Exercise Prescription Goal: Initial exercise prescription builds to 30-45 minutes a day of aerobic activity, 2-3 days per week.  Home exercise guidelines will be given to patient during program as part of exercise prescription that the participant will acknowledge.  Activity Barriers & Risk Stratification:  Activity Barriers & Cardiac Risk Stratification - 09/05/23 1358       Activity Barriers & Cardiac Risk Stratification   Activity Barriers Arthritis;Joint Problems    Cardiac Risk Stratification High   <5 METs on         6 Minute Walk:  6 Minute Walk     Row Name 09/05/23 1528         6 Minute Walk   Phase Initial     Distance 1140 feet     Walk Time 6 minutes     # of Rest Breaks 0     MPH 2.16     METS 1.55     RPE 14     Perceived Dyspnea  0     VO2 Peak 5.42     Symptoms Yes (comment)     Comments no pain, a little winded. resolved with rest     Resting HR 63 bpm     Resting BP 112/86     Resting Oxygen Saturation  97 %     Exercise Oxygen Saturation  during 6 min walk 97 %     Max Ex. HR 110 bpm     Max Ex. BP 142/82     2 Minute Post BP 122/86        Oxygen Initial Assessment:   Oxygen Re-Evaluation:   Oxygen Discharge (Final Oxygen Re-Evaluation):   Initial Exercise Prescription:  Initial Exercise Prescription - 09/05/23 1500       Date of Initial Exercise RX and Referring Provider   Date 09/05/23    Referring Provider Lonni Cash, MD    Expected Discharge Date 11/27/23      NuStep   Level 1    SPM 70    Minutes 15    METs 1.5      Track   Laps 13    Minutes 15    METs 1.5      Prescription Details   Frequency (times per week)  3    Duration Progress to 30 minutes of continuous aerobic without  signs/symptoms of physical distress      Intensity   THRR 40-80% of Max Heartrate 61-122    Ratings of Perceived Exertion 11-13    Perceived Dyspnea 0-4      Progression   Progression Continue progressive overload as per policy without signs/symptoms or physical distress.      Resistance Training   Training Prescription Yes    Weight 3    Reps 10-15          Perform Capillary Blood Glucose checks as needed.  Exercise Prescription Changes:   Exercise Prescription Changes     Row Name 09/16/23 1600 10/09/23 1200 10/16/23 1100 10/30/23 1200 11/06/23 1400     Response to Exercise   Blood Pressure (Admit) 130/80 122/80 124/78 130/80 130/80   Blood Pressure (Exercise) 160/88 142/80 152/80 -- --   Blood Pressure (Exit) 126/80 128/72 118/78 122/70 114/70   Heart Rate (Admit) 77 bpm 77 bpm 80 bpm 66 bpm 82 bpm   Heart Rate (Exercise) 147 bpm 115 bpm 125 bpm 132 bpm 150 bpm   Heart Rate (Exit) 86 bpm 92 bpm 87 bpm 75 bpm 100 bpm   Rating of Perceived Exertion (Exercise) 12 11 12 12 13    Symptoms 4/10 bilateral knee apin None None None None   Comments Pt's first day in the CRP2 program Reviewed METs Reviewed METs and goals Reviewed METs Reviewed home exercise Rx   Duration Progress to 30 minutes of  aerobic without signs/symptoms of physical distress Progress to 30 minutes of  aerobic without signs/symptoms of physical distress Continue with 30 min of aerobic exercise without signs/symptoms of physical distress. Continue with 30 min of aerobic exercise without signs/symptoms of physical distress. Continue with 30 min of aerobic exercise without signs/symptoms of physical distress.   Intensity THRR unchanged THRR unchanged THRR unchanged THRR unchanged THRR unchanged     Progression   Progression Continue to progress workloads to maintain intensity without signs/symptoms of physical distress. Continue to progress workloads to maintain intensity without signs/symptoms of physical  distress. Continue to progress workloads to maintain intensity without signs/symptoms of physical distress. Continue to progress workloads to maintain intensity without signs/symptoms of physical distress. Continue to progress workloads to maintain intensity without signs/symptoms of physical distress.   Average METs 2.3 2.6 2.1 2.7 2.7     Resistance Training   Training Prescription Yes No No No No   Weight 3 No wts on wednesdays No wts on wednesdays No wts on wednesdays No wts on wednesdays   Reps 10-15 -- -- -- --   Time 5 Minutes -- -- -- --     Interval Training   Interval Training No No No No No     NuStep   Level 1 -- -- -- --   Minutes 10 -- -- -- --   METs 1.8 -- -- -- --     Arm Ergometer   Level -- 1 1.5 2 2    Watts -- -- 15 30 32   Minutes -- 5 15 57 54   METs -- -- 1.5 2.5 2     Track   Laps 14 13 13 15 17    Minutes 15 15 14 15 15    METs 2.79 2.66 2.66 2.91 3.17     Home Exercise Plan   Plans to continue exercise at -- -- -- -- [employer] (comment)   Frequency -- -- -- --  Add 2 additional days to program exercise sessions.   Initial Home Exercises Provided -- -- -- -- 11/06/23    Row Name 11/13/23 1015             Response to Exercise   Blood Pressure (Admit) 140/82       Blood Pressure (Exercise) 168/82       Blood Pressure (Exit) 108/64       Heart Rate (Admit) 687 bpm       Heart Rate (Exercise) 138 bpm       Heart Rate (Exit) 90 bpm       Rating of Perceived Exertion (Exercise) 12       Symptoms None       Comments Reviewed METS       Duration Continue with 30 min of aerobic exercise without signs/symptoms of physical distress.       Intensity THRR unchanged         Progression   Progression Continue to progress workloads to maintain intensity without signs/symptoms of physical distress.       Average METs 2.85         Resistance Training   Training Prescription No       Weight No wts on wednesdays         Interval Training    Interval Training No         Arm Ergometer   Level 2       Watts 41       Minutes 54       METs 2.4         Track   Laps 18       Minutes 15       METs 3.3         Home Exercise Plan   Plans to continue exercise at [employer] (comment)       Frequency Add 2 additional days to program exercise sessions.       Initial Home Exercises Provided 11/06/23          Exercise Comments:   Exercise Comments     Row Name 09/16/23 1643 10/09/23 1203 10/16/23 1117 10/30/23 1113 11/06/23 1414   Exercise Comments Pt's first day in the CRP2 program. Pt had c/o of bilateral knee pain on nustep. Will continue to monitor for pain on this modality. Reviewed METs. Pt is making slow progress. Pt Reviewed METs and goals. Pt has seen some imrpovement in his strength, stamina, and energy , all of which are goals. Pt voices his goal to eat healthier is a work in progress; pt has not lost weight which is a goal. Will ask dietician to speak with patient. Reviewed METs. Pt continues to make progress. Reviewed home exercise Rx. Pt is going to join a gym and continue walking. Encouraged to add 2x/week for 30 minutes to achieve 150 miniutes of exercise per week.    Row Name 11/13/23 1015           Exercise Comments Reviewed METs, pt continues to progress.          Exercise Goals and Review:   Exercise Goals     Row Name 09/05/23 1254             Exercise Goals   Increase Physical Activity Yes       Intervention Provide advice, education, support and counseling about physical activity/exercise needs.;Develop an individualized exercise prescription for aerobic and resistive training based on initial evaluation findings,  risk stratification, comorbidities and participant's personal goals.       Expected Outcomes Short Term: Attend rehab on a regular basis to increase amount of physical activity.;Long Term: Exercising regularly at least 3-5 days a week.;Long Term: Add in home exercise to make  exercise part of routine and to increase amount of physical activity.       Increase Strength and Stamina Yes       Intervention Develop an individualized exercise prescription for aerobic and resistive training based on initial evaluation findings, risk stratification, comorbidities and participant's personal goals.;Provide advice, education, support and counseling about physical activity/exercise needs.       Expected Outcomes Short Term: Increase workloads from initial exercise prescription for resistance, speed, and METs.;Short Term: Perform resistance training exercises routinely during rehab and add in resistance training at home;Long Term: Improve cardiorespiratory fitness, muscular endurance and strength as measured by increased METs and functional capacity ( )       Able to understand and use rate of perceived exertion (RPE) scale Yes       Intervention Provide education and explanation on how to use RPE scale       Expected Outcomes Short Term: Able to use RPE daily in rehab to express subjective intensity level;Long Term:  Able to use RPE to guide intensity level when exercising independently       Knowledge and understanding of Target Heart Rate Range (THRR) Yes       Intervention Provide education and explanation of THRR including how the numbers were predicted and where they are located for reference       Expected Outcomes Short Term: Able to state/look up THRR;Short Term: Able to use daily as guideline for intensity in rehab;Long Term: Able to use THRR to govern intensity when exercising independently       Understanding of Exercise Prescription Yes       Intervention Provide education, explanation, and written materials on patient's individual exercise prescription       Expected Outcomes Long Term: Able to explain home exercise prescription to exercise independently;Short Term: Able to explain program exercise prescription          Exercise Goals Re-Evaluation :  Exercise Goals  Re-Evaluation     Row Name 09/16/23 1642 11/15/23 1528           Exercise Goal Re-Evaluation   Exercise Goals Review Increase Physical Activity;Increase Strength and Stamina;Able to understand and use rate of perceived exertion (RPE) scale;Knowledge and understanding of Target Heart Rate Range (THRR);Understanding of Exercise Prescription Increase Physical Activity;Increase Strength and Stamina;Able to understand and use rate of perceived exertion (RPE) scale;Knowledge and understanding of Target Heart Rate Range (THRR);Understanding of Exercise Prescription      Comments Pt's first day in the CRP2 program. Pt understands the exercise Rx, RPE sclae and THRR. Reviewed goals with patient today. Pt is making good progress on his goals of imrpoved strength and stamina. Pt also has more energy, which is also a goal. Pt has goal of weight loss, he has lost about 2 kg.      Expected Outcomes Will continue to monitor and progress exercise workloads as tolerated. Will continue to monitor and progress exercise workloads as tolerated.         Discharge Exercise Prescription (Final Exercise Prescription Changes):  Exercise Prescription Changes - 11/13/23 1015       Response to Exercise   Blood Pressure (Admit) 140/82    Blood Pressure (Exercise) 168/82    Blood  Pressure (Exit) 108/64    Heart Rate (Admit) 687 bpm    Heart Rate (Exercise) 138 bpm    Heart Rate (Exit) 90 bpm    Rating of Perceived Exertion (Exercise) 12    Symptoms None    Comments Reviewed METS    Duration Continue with 30 min of aerobic exercise without signs/symptoms of physical distress.    Intensity THRR unchanged      Progression   Progression Continue to progress workloads to maintain intensity without signs/symptoms of physical distress.    Average METs 2.85      Resistance Training   Training Prescription No    Weight No wts on wednesdays      Interval Training   Interval Training No      Arm Ergometer   Level 2     Watts 41    Minutes 54    METs 2.4      Track   Laps 18    Minutes 15    METs 3.3      Home Exercise Plan   Plans to continue exercise at [employer] (comment)    Frequency Add 2 additional days to program exercise sessions.    Initial Home Exercises Provided 11/06/23          Nutrition:  Target Goals: Understanding of nutrition guidelines, daily intake of sodium 1500mg , cholesterol 200mg , calories 30% from fat and 7% or less from saturated fats, daily to have 5 or more servings of fruits and vegetables.  Biometrics:  Pre Biometrics - 09/05/23 1252       Pre Biometrics   Waist Circumference 59 inches    Hip Circumference 53 inches    Waist to Hip Ratio 1.11 %    Triceps Skinfold 48 mm    % Body Fat 50.8 %    Grip Strength 29 kg    Flexibility 9 in    Single Leg Stand 1 seconds           Nutrition Therapy Plan and Nutrition Goals:  Nutrition Therapy & Goals - 10/16/23 0857       Nutrition Therapy   Diet Heart Healthy Diet      Personal Nutrition Goals   Nutrition Goal Patient to identify strategies for reducing cardiovascular risk by attending the [employer] education and nutrition series weekly.   goal in progress.   Personal Goal #2 Patient to improve diet quality by using the plate method as a guide for meal planning to include lean protein/plant protein, fruits, vegetables, whole grains, nonfat dairy as part of a well-balanced diet.   goal in progress.   Personal Goal #3 Patient to identify strategies for weight loss with goal of 0.5-2.0# per week.   goal not met.   Comments Goals in progress.Patient has medical history DES LAD, chronic HFpEF, HTN, hyperlipidemia. He continues to attend the [employer] education/nutrition series regularly. He is currently taking repatha  due to statin intolerance; LDL is well controlled. A1c is in a pre-diabetic range. Have discussed strategies for weight loss including calorie density, the plate method as a guide for meal  planning, label reading, etc. He has maintained his weight since starting with our program. Patient will benefit from participation in intensive cardiac rehab for nutrition education, exercise, and lifestyle modification.      Intervention Plan   Intervention Prescribe, educate and counsel regarding individualized specific dietary modifications aiming towards targeted core components such as weight, hypertension, lipid management, diabetes, heart failure and other comorbidities.;Nutrition handout(s) given  to patient.    Expected Outcomes Short Term Goal: Understand basic principles of dietary content, such as calories, fat, sodium, cholesterol and nutrients.;Long Term Goal: Adherence to prescribed nutrition plan.          Nutrition Assessments:  Nutrition Assessments - 09/16/23 1046       Rate Your Plate Scores   Pre Score 39         MEDIFICTS Score Key: >=70 Need to make dietary changes  40-70 Heart Healthy Diet <= 40 Therapeutic Level Cholesterol Diet   Flowsheet Row CARDIAC REHAB PHASE II EXERCISE from 09/16/2023 in [facility]  Picture Your Plate Total Score on Admission 39   Picture Your Plate Scores: <59 Unhealthy dietary pattern with much room for improvement. 41-50 Dietary pattern unlikely to meet recommendations for good health and room for improvement. 51-60 More healthful dietary pattern, with some room for improvement.  >60 Healthy dietary pattern, although there may be some specific behaviors that could be improved.    Nutrition Goals Re-Evaluation:  Nutrition Goals Re-Evaluation     Row Name 09/16/23 0911 10/16/23 0857           Goals   Current Weight 326 lb 8 oz (148.1 kg) 327 lb 9.7 oz (148.6 kg)      Comment triglycerides 213, HDL 30, LDL 50, A1c 6.4 triglycerides 213, HDL 30, LDL 50, A1c 6.4      Expected Outcome Patient has medical history DES LAD, chronic HFpEF, HTN, hyperlipidemia. He is  currently taking repatha  due to statin intolerance;  LDL is well controlled. A1c is in a pre-diabetic range. Will continue to discuss strategies for weight loss including calorie density, the plate method as a guide for meal planning, label reading, etc. Patient will benefit from participation in intensive cardiac rehab for nutrition education, exercise, and lifestyle modification. Goals in progress.Patient has medical history DES LAD, chronic HFpEF, HTN, hyperlipidemia. He continues to attend the [employer] education/nutrition series regularly. He is currently taking repatha  due to statin intolerance; LDL is well controlled. A1c is in a pre-diabetic range. Have discussed strategies for weight loss including calorie density, the plate method as a guide for meal planning, label reading, etc. He has maintained his weight since starting with our program. Patient will benefit from participation in intensive cardiac rehab for nutrition education, exercise, and lifestyle modification.         Nutrition Goals Re-Evaluation:  Nutrition Goals Re-Evaluation     Row Name 09/16/23 0911 10/16/23 0857           Goals   Current Weight 326 lb 8 oz (148.1 kg) 327 lb 9.7 oz (148.6 kg)      Comment triglycerides 213, HDL 30, LDL 50, A1c 6.4 triglycerides 213, HDL 30, LDL 50, A1c 6.4      Expected Outcome Patient has medical history DES LAD, chronic HFpEF, HTN, hyperlipidemia. He is currently taking repatha  due to statin intolerance;  LDL is well controlled. A1c is in a pre-diabetic range. Will continue to discuss strategies for weight loss including calorie density, the plate method as a guide for meal planning, label reading, etc. Patient will benefit from participation in intensive cardiac rehab for nutrition education, exercise, and lifestyle modification. Goals in progress.Patient has medical history DES LAD, chronic HFpEF, HTN, hyperlipidemia. He continues to attend the [employer] education/nutrition series regularly.  He is currently taking repatha  due to statin intolerance; LDL is well controlled. A1c is in a  pre-diabetic range. Have discussed strategies for weight loss including calorie density, the plate method as a guide for meal planning, label reading, etc. He has maintained his weight since starting with our program. Patient will benefit from participation in intensive cardiac rehab for nutrition education, exercise, and lifestyle modification.         Nutrition Goals Discharge (Final Nutrition Goals Re-Evaluation):  Nutrition Goals Re-Evaluation - 10/16/23 0857       Goals   Current Weight 327 lb 9.7 oz (148.6 kg)    Comment triglycerides 213, HDL 30, LDL 50, A1c 6.4    Expected Outcome Goals in progress.Patient has medical history DES LAD, chronic HFpEF, HTN, hyperlipidemia. He continues to attend the [employer] education/nutrition series regularly. He is currently taking repatha  due to statin intolerance; LDL is well controlled. A1c is in a pre-diabetic range. Have discussed strategies for weight loss including calorie density, the plate method as a guide for meal planning, label reading, etc. He has maintained his weight since starting with our program. Patient will benefit from participation in intensive cardiac rehab for nutrition education, exercise, and lifestyle modification.          Psychosocial: Target Goals: Acknowledge presence or absence of significant depression and/or stress, maximize coping skills, provide positive support system. Participant is able to verbalize types and ability to use techniques and skills needed for reducing stress and depression.  Initial Review & Psychosocial Screening:  Initial Psych Review & Screening - 09/05/23 1358       Initial Review   Current issues with None Identified      Family Dynamics   Good Support System? Yes   friends and family     Barriers   Psychosocial barriers to participate in program There are no identifiable barriers or  psychosocial needs.      Screening Interventions   Interventions Provide feedback about the scores to participant;Encouraged to exercise    Expected Outcomes Long Term goal: The participant improves quality of Life and PHQ9 Scores as seen by post scores and/or verbalization of changes;Short Term goal: Identification and review with participant of any Quality of Life or Depression concerns found by scoring the questionnaire.          Quality of Life Scores:  Quality of Life - 09/05/23 1531       Quality of Life   Select Quality of Life      Quality of Life Scores   Health/Function Pre 22.47 %    Socioeconomic Pre 27.25 %    Psych/Spiritual Pre 26.07 %    Family Pre 24.7 %    GLOBAL Pre 24.6 %         Scores of 19 and below usually indicate a poorer quality of life in these areas.  A difference of  2-3 points is a clinically meaningful difference.  A difference of 2-3 points in the total score of the Quality of Life Index has been associated with significant improvement in overall quality of life, self-image, physical symptoms, and general health in studies assessing change in quality of life.  PHQ-9: Review Flowsheet  More data exists      11/04/2023 10/10/2023 09/05/2023 02/18/2023 07/12/2022  Depression screen PHQ 2/9  Decreased Interest 0 0 0 0 2  Down, Depressed, Hopeless 0 0 0 0 0  PHQ - 2 Score 0 0 0 0 2  Altered sleeping 0 0 0 0 2  Tired, decreased energy 0 0 0 0 2  Change in appetite 0  0 0 0 2  Feeling bad or failure about yourself  0 0 0 0 2  Trouble concentrating 0 0 0 0 0  Moving slowly or fidgety/restless 0 0 0 0 0  Suicidal thoughts 0 0 0 0 0  PHQ-9 Score 0 0 0 0 10  Difficult doing work/chores Not difficult at all Not difficult at all Not difficult at all - -   Interpretation of Total Score  Total Score Depression Severity:  1-4 = Minimal depression, 5-9 = Mild depression, 10-14 = Moderate depression, 15-19 = Moderately severe depression, 20-27 = Severe  depression   Psychosocial Evaluation and Intervention:   Psychosocial Re-Evaluation:  Psychosocial Re-Evaluation     Row Name 09/16/23 1648 10/22/23 1816 11/14/23 0953         Psychosocial Re-Evaluation   Current issues with None Identified None Identified None Identified     Comments -- -- Alex [name] will complete cardiac rehab on 11/27/23     Interventions Encouraged to attend Cardiac Rehabilitation for the exercise Encouraged to attend Cardiac Rehabilitation for the exercise Encouraged to attend Cardiac Rehabilitation for the exercise     Continue Psychosocial Services  No Follow up required No Follow up required No Follow up required        Psychosocial Discharge (Final Psychosocial Re-Evaluation):  Psychosocial Re-Evaluation - 11/14/23 0953       Psychosocial Re-Evaluation   Current issues with None Identified    Comments Alex [name] will complete cardiac rehab on 11/27/23    Interventions Encouraged to attend Cardiac Rehabilitation for the exercise    Continue Psychosocial Services  No Follow up required          Vocational Rehabilitation: Provide vocational rehab assistance to qualifying candidates.   Vocational Rehab Evaluation & Intervention:  Vocational Rehab - 09/05/23 1359       Initial Vocational Rehab Evaluation & Intervention   Assessment shows need for Vocational Rehabilitation No   retired         Education: Education Goals: Education classes will be provided on a weekly basis, covering required topics. Participant will state understanding/return demonstration of topics presented.    Education     Row Name 09/16/23 0800     Education   Cardiac Education Topics Pritikin   Geographical information systems officer Psychosocial   Psychosocial Workshop Healthy Sleep for a Healthy Heart   Instruction Review Code 1- Verbalizes Understanding   Class Start Time 0815   Class Stop Time 0900   Class Time Calculation (min) 45  min    Row Name 09/18/23 0900     Education   Cardiac Education Topics Pritikin   Secondary school teacher School   Educator Dietitian   Weekly Topic Simple Sides and Sauces   Instruction Review Code 1- Verbalizes Understanding   Class Start Time 0815   Class Stop Time 0846   Class Time Calculation (min) 31 min    Row Name 09/25/23 0900     Education   Cardiac Education Topics Pritikin   Secondary school teacher School   Educator Dietitian   Weekly Topic Powerhouse Plant-Based Proteins   Instruction Review Code 1- Verbalizes Understanding   Class Start Time 0815   Class Stop Time 0850   Class Time Calculation (min) 35 min    Row Name 09/30/23 0900     Education   Cardiac Education Topics Pritikin  [employer] Workshops     Garment/textile technologist Psychosocial   Psychosocial Workshop From [employer] to Heart: The Power of a Healthy Outlook   Instruction Review Code 1- Verbalizes Understanding   Class Start Time 0815   Class Stop Time 0853   Class Time Calculation (min) 38 min    Row Name 10/02/23 0800     Education   Cardiac Education Topics Pritikin   Secondary school teacher School   Educator Dietitian   Weekly Topic Tasty Appetizers and Snacks   Instruction Review Code 1- Verbalizes Understanding   Class Start Time 0815   Class Stop Time 0856   Class Time Calculation (min) 41 min    Row Name 10/09/23 0800     Education   Cardiac Education Topics Pritikin   Secondary school teacher School   Educator Dietitian   Weekly Topic Adding Flavor - Sodium-Free   Instruction Review Code 1- Verbalizes Understanding   Class Start Time 0815   Class Stop Time 0850   Class Time Calculation (min) 35 min    Row Name 10/14/23 0800     Education   Cardiac Education Topics Pritikin   [employer]     Workshops   Educator Dietitian   [employer] Nutrition   Nutrition Workshop Label Reading    Instruction Review Code 1- Verbalizes Understanding   Class Start Time 0815   Class Stop Time 0910   Class Time Calculation (min) 55 min    Row Name 10/16/23 0800     Education   Cardiac Education Topics Pritikin   [employer] Core Videos     Core Videos   Educator Exercise Physiologist   [employer] Nutrition   Nutrition Other  Label reading   Instruction Review Code 1- Verbalizes Understanding   Class Start Time 0815   Class Stop Time 0847   Class Time Calculation (min) 32 min    Row Name 10/18/23 0900     Education   Cardiac Education Topics Pritikin   Orthoptist   Educator Dietitian   Weekly Topic Fast and Healthy Breakfasts   Instruction Review Code 1- Verbalizes Understanding   Class Start Time 0815   Class Stop Time 0847   Class Time Calculation (min) 32 min    Row Name 10/21/23 0800     Education   Cardiac Education Topics Pritikin   Hospital doctor Education   General Education Metabolic Syndrome and Belly Fat   Instruction Review Code 1- Verbalizes Understanding   Class Start Time [phone]   Class Stop Time 0900   Class Time Calculation (min) 43 min    Row Name 10/23/23 0800     Education   Cardiac Education Topics Pritikin   Secondary school teacher School   Educator Dietitian   Weekly Topic Personalizing Your Pritikin Plate   Instruction Review Code 1- Verbalizes Understanding   Class Start Time 0815   Class Stop Time 0854   Class Time Calculation (min) 39 min    Row Name 10/25/23 0900     Education   Cardiac Education Topics Pritikin   [employer] Core Videos     Core Videos   Educator Dietitian   [employer] Nutrition   Nutrition Overview of the Pritikin Eating Plan  Instruction Review Code 1- Verbalizes Understanding    Row Name 10/28/23 0800     Education   Cardiac Education Topics Pritikin   [employer] Works[employer]     Workshops   Educator  Exercise Physiologist   [employer] Psychosocial   Psychosocial Workshop Recognizing and Reducing Stress   Instruction Review Code 1- Verbalizes Understanding   Class Start Time 0815   Class Stop Time 0857   Class Time Calculation (min) 42 min    Row Name 10/30/23 0900     Education   Cardiac Education Topics Pritikin   Secondary school teacher School   Educator Dietitian   Weekly Topic Delicious Desserts   Instruction Review Code 1- Verbalizes Understanding   Class Start Time 0815   Class Stop Time 0855   Class Time Calculation (min) 40 min    Row Name 11/01/23 0900     Education   Cardiac Education Topics Pritikin   [employer] Core Videos     Core Videos   Educator Dietitian   [employer] Nutrition   Nutrition Calorie Density   Instruction Review Code 1- Verbalizes Understanding   Class Start Time [phone]   Class Stop Time 0858   Class Time Calculation (min) 42 min    Row Name 11/06/23 0800     Education   Cardiac Education Topics Pritikin   Secondary school teacher School   Educator Dietitian   Weekly Topic Efficiency Cooking - Meals in a Snap   Instruction Review Code 1- Verbalizes Understanding   Class Start Time 0815   Class Stop Time 0858   Class Time Calculation (min) 43 min    Row Name 11/13/23 0900     Education   Cardiac Education Topics Pritikin   Orthoptist   Educator Nurse   Weekly Topic One-Pot Wonders   Instruction Review Code 1- Verbalizes Understanding   Class Start Time 0815   Class Stop Time 0855   Class Time Calculation (min) 40 min    Row Name 11/18/23 0800     Education   Cardiac Education Topics Pritikin   [employer]     Workshops   Educator Exercise Physiologist   [employer] Psychosocial   Psychosocial Workshop Focused Goals, Sustainable Changes   Instruction Review Code 1- Verbalizes Understanding   Class Start Time 0815   Class Stop Time 0850   Class Time Calculation (min) 35 min       Core Videos: Exercise    Move It!  Clinical staff conducted group or individual video education with verbal and written material and guidebook.  Patient learns the recommended Pritikin exercise program. Exercise with the goal of living a long, healthy life. Some of the health benefits of exercise include controlled diabetes, healthier blood pressure levels, improved cholesterol levels, improved heart and lung capacity, improved sleep, and better body composition. Everyone should speak with their doctor before starting or changing an exercise routine.  Biomechanical Limitations Clinical staff conducted group or individual video education with verbal and written material and guidebook.  Patient learns how biomechanical limitations can impact exercise and how we can mitigate and possibly overcome limitations to have an impactful and balanced exercise routine.  Body Composition Clinical staff conducted group or individual video education with verbal and written material and guidebook.  Patient learns that body composition (ratio of muscle mass to fat mass) is a key component to assessing overall fitness, rather  than body weight alone. Increased fat mass, especially visceral belly fat, can put us  at increased risk for metabolic syndrome, type 2 diabetes, heart disease, and even death. It is recommended to combine diet and exercise (cardiovascular and resistance training) to improve your body composition. Seek guidance from your physician and exercise physiologist before implementing an exercise routine.  Exercise Action Plan Clinical staff conducted group or individual video education with verbal and written material and guidebook.  Patient learns the recommended strategies to achieve and enjoy long-term exercise adherence, including variety, self-motivation, self-efficacy, and positive decision making. Benefits of exercise include fitness, good health, weight management, more energy, better  sleep, less stress, and overall well-being.  Medical   Heart Disease Risk Reduction Clinical staff conducted group or individual video education with verbal and written material and guidebook.  Patient learns our heart is our most vital organ as it circulates oxygen, nutrients, white blood cells, and hormones throughout the entire body, and carries waste away. Data supports a plant-based eating plan like the Pritikin Program for its effectiveness in slowing progression of and reversing heart disease. The video provides a number of recommendations to address heart disease.   Metabolic Syndrome and Belly Fat  Clinical staff conducted group or individual video education with verbal and written material and guidebook.  Patient learns what metabolic syndrome is, how it leads to heart disease, and how one can reverse it and keep it from coming back. You have metabolic syndrome if you have 3 of the following 5 criteria: abdominal obesity, high blood pressure, high triglycerides, low HDL cholesterol, and high blood sugar.  Hypertension and Heart Disease Clinical staff conducted group or individual video education with verbal and written material and guidebook.  Patient learns that high blood pressure, or hypertension, is very common in the [place] . Hypertension is largely due to excessive salt intake, but other important risk factors include being overweight, physical inactivity, drinking too much alcohol, smoking, and not eating enough potassium from fruits and vegetables. High blood pressure is a leading risk factor for heart attack, stroke, congestive heart failure, dementia, kidney failure, and premature death. Long-term effects of excessive salt intake include stiffening of the arteries and thickening of heart muscle and organ damage. Recommendations include ways to reduce hypertension and the risk of heart disease.  Diseases of Our Time - Focusing on Diabetes Clinical staff conducted group or  individual video education with verbal and written material and guidebook.  Patient learns why the best way to stop diseases of our time is prevention, through food and other lifestyle changes. Medicine (such as prescription pills and surgeries) is often only a Band-Aid on the problem, not a long-term solution. Most common diseases of our time include obesity, type 2 diabetes, hypertension, heart disease, and cancer. The Pritikin Program is recommended and has been proven to help reduce, reverse, and/or prevent the damaging effects of metabolic syndrome.  Nutrition   Overview of the Pritikin Eating Plan  Clinical staff conducted group or individual video education with verbal and written material and guidebook.  Patient learns about the Pritikin Eating Plan for disease risk reduction. The Pritikin Eating Plan emphasizes a wide variety of unrefined, minimally-processed carbohydrates, like fruits, vegetables, whole grains, and legumes. Go, Caution, and Stop food choices are explained. Plant-based and lean animal proteins are emphasized. Rationale provided for low sodium intake for blood pressure control, low added sugars for blood sugar stabilization, and low added fats and oils for coronary artery disease risk reduction and weight  management.  Calorie Density  Clinical staff conducted group or individual video education with verbal and written material and guidebook.  Patient learns about calorie density and how it impacts the Pritikin Eating Plan. Knowing the characteristics of the food you choose will help you decide whether those foods will lead to weight gain or weight loss, and whether you want to consume more or less of them. Weight loss is usually a side effect of the Pritikin Eating Plan because of its focus on low calorie-dense foods.  Label Reading  Clinical staff conducted group or individual video education with verbal and written material and guidebook.  Patient learns about the Pritikin  recommended label reading guidelines and corresponding recommendations regarding calorie density, added sugars, sodium content, and whole grains.  Dining Out - Part 1  Clinical staff conducted group or individual video education with verbal and written material and guidebook.  Patient learns that restaurant meals can be sabotaging because they can be so high in calories, fat, sodium, and/or sugar. Patient learns recommended strategies on how to positively address this and avoid unhealthy pitfalls.  Facts on Fats  Clinical staff conducted group or individual video education with verbal and written material and guidebook.  Patient learns that lifestyle modifications can be just as effective, if not more so, as many medications for lowering your risk of heart disease. A Pritikin lifestyle can help to reduce your risk of inflammation and atherosclerosis (cholesterol build-up, or plaque, in the artery walls). Lifestyle interventions such as dietary choices and physical activity address the cause of atherosclerosis. A review of the types of fats and their impact on blood cholesterol levels, along with dietary recommendations to reduce fat intake is also included.  Nutrition Action Plan  Clinical staff conducted group or individual video education with verbal and written material and guidebook.  Patient learns how to incorporate Pritikin recommendations into their lifestyle. Recommendations include planning and keeping personal health goals in mind as an important part of their success.  Healthy Mind-Set    Healthy Minds, Bodies, Hearts  Clinical staff conducted group or individual video education with verbal and written material and guidebook.  Patient learns how to identify when they are stressed. Video will discuss the impact of that stress, as well as the many benefits of stress management. Patient will also be introduced to stress management techniques. The way we think, act, and feel has an impact on  our hearts.  How Our Thoughts Can Heal Our Hearts  Clinical staff conducted group or individual video education with verbal and written material and guidebook.  Patient learns that negative thoughts can cause depression and anxiety. This can result in negative lifestyle behavior and serious health problems. Cognitive behavioral therapy is an effective method to help control our thoughts in order to change and improve our emotional outlook.  Additional Videos:  Exercise    Improving Performance  Clinical staff conducted group or individual video education with verbal and written material and guidebook.  Patient learns to use a non-linear approach by alternating intensity levels and lengths of time spent exercising to help burn more calories and lose more body fat. Cardiovascular exercise helps improve heart health, metabolism, hormonal balance, blood sugar control, and recovery from fatigue. Resistance training improves strength, endurance, balance, coordination, reaction time, metabolism, and muscle mass. Flexibility exercise improves circulation, posture, and balance. Seek guidance from your physician and exercise physiologist before implementing an exercise routine and learn your capabilities and proper form for all exercise.  Introduction to  Yoga  Clinical staff conducted group or individual video education with verbal and written material and guidebook.  Patient learns about yoga, a discipline of the coming together of mind, breath, and body. The benefits of yoga include improved flexibility, improved range of motion, better posture and core strength, increased lung function, weight loss, and positive self-image. Yoga's heart health benefits include lowered blood pressure, healthier heart rate, decreased cholesterol and triglyceride levels, improved immune function, and reduced stress. Seek guidance from your physician and exercise physiologist before implementing an exercise routine and learn  your capabilities and proper form for all exercise.  Medical   Aging: Enhancing Your Quality of Life  Clinical staff conducted group or individual video education with verbal and written material and guidebook.  Patient learns key strategies and recommendations to stay in good physical health and enhance quality of life, such as prevention strategies, having an advocate, securing a Health Care Proxy and Power of Attorney, and keeping a list of medications and system for tracking them. It also discusses how to avoid risk for bone loss.  Biology of Weight Control  Clinical staff conducted group or individual video education with verbal and written material and guidebook.  Patient learns that weight gain occurs because we consume more calories than we burn (eating more, moving less). Even if your body weight is normal, you may have higher ratios of fat compared to muscle mass. Too much body fat puts you at increased risk for cardiovascular disease, heart attack, stroke, type 2 diabetes, and obesity-related cancers. In addition to exercise, following the Pritikin Eating Plan can help reduce your risk.  Decoding Lab Results  Clinical staff conducted group or individual video education with verbal and written material and guidebook.  Patient learns that lab test reflects one measurement whose values change over time and are influenced by many factors, including medication, stress, sleep, exercise, food, hydration, pre-existing medical conditions, and more. It is recommended to use the knowledge from this video to become more involved with your lab results and evaluate your numbers to speak with your doctor.   Diseases of Our Time - Overview  Clinical staff conducted group or individual video education with verbal and written material and guidebook.  Patient learns that according to the CDC, 50% to 70% of chronic diseases (such as obesity, type 2 diabetes, elevated lipids, hypertension, and heart disease)  are avoidable through lifestyle improvements including healthier food choices, listening to satiety cues, and increased physical activity.  Sleep Disorders Clinical staff conducted group or individual video education with verbal and written material and guidebook.  Patient learns how good quality and duration of sleep are important to overall health and well-being. Patient also learns about sleep disorders and how they impact health along with recommendations to address them, including discussing with a physician.  Nutrition  Dining Out - Part 2 Clinical staff conducted group or individual video education with verbal and written material and guidebook.  Patient learns how to plan ahead and communicate in order to maximize their dining experience in a healthy and nutritious manner. Included are recommended food choices based on the type of restaurant the patient is visiting.   Fueling a Banker conducted group or individual video education with verbal and written material and guidebook.  There is a strong connection between our food choices and our health. Diseases like obesity and type 2 diabetes are very prevalent and are in large-part due to lifestyle choices. The Pritikin Eating Plan provides plenty  of food and hunger-curbing satisfaction. It is easy to follow, affordable, and helps reduce health risks.  Menu Workshop  Clinical staff conducted group or individual video education with verbal and written material and guidebook.  Patient learns that restaurant meals can sabotage health goals because they are often packed with calories, fat, sodium, and sugar. Recommendations include strategies to plan ahead and to communicate with the manager, chef, or server to help order a healthier meal.  Planning Your Eating Strategy  Clinical staff conducted group or individual video education with verbal and written material and guidebook.  Patient learns about the Pritikin Eating Plan  and its benefit of reducing the risk of disease. The Pritikin Eating Plan does not focus on calories. Instead, it emphasizes high-quality, nutrient-rich foods. By knowing the characteristics of the foods, we choose, we can determine their calorie density and make informed decisions.  Targeting Your Nutrition Priorities  Clinical staff conducted group or individual video education with verbal and written material and guidebook.  Patient learns that lifestyle habits have a tremendous impact on disease risk and progression. This video provides eating and physical activity recommendations based on your personal health goals, such as reducing LDL cholesterol, losing weight, preventing or controlling type 2 diabetes, and reducing high blood pressure.  Vitamins and Minerals  Clinical staff conducted group or individual video education with verbal and written material and guidebook.  Patient learns different ways to obtain key vitamins and minerals, including through a recommended healthy diet. It is important to discuss all supplements you take with your doctor.   Healthy Mind-Set    Smoking Cessation  Clinical staff conducted group or individual video education with verbal and written material and guidebook.  Patient learns that cigarette smoking and tobacco addiction pose a serious health risk which affects millions of people. Stopping smoking will significantly reduce the risk of heart disease, lung disease, and many forms of cancer. Recommended strategies for quitting are covered, including working with your doctor to develop a successful plan.  Culinary   Becoming a Set designer conducted group or individual video education with verbal and written material and guidebook.  Patient learns that cooking at home can be healthy, cost-effective, quick, and puts them in control. Keys to cooking healthy recipes will include looking at your recipe, assessing your equipment needs, planning  ahead, making it simple, choosing cost-effective seasonal ingredients, and limiting the use of added fats, salts, and sugars.  Cooking - Breakfast and Snacks  Clinical staff conducted group or individual video education with verbal and written material and guidebook.  Patient learns how important breakfast is to satiety and nutrition through the entire day. Recommendations include key foods to eat during breakfast to help stabilize blood sugar levels and to prevent overeating at meals later in the day. Planning ahead is also a key component.  Cooking - Educational psychologist conducted group or individual video education with verbal and written material and guidebook.  Patient learns eating strategies to improve overall health, including an approach to cook more at home. Recommendations include thinking of animal protein as a side on your plate rather than center stage and focusing instead on lower calorie dense options like vegetables, fruits, whole grains, and plant-based proteins, such as beans. Making sauces in large quantities to freeze for later and leaving the skin on your vegetables are also recommended to maximize your experience.  Cooking - Healthy Salads and Dressing Clinical staff conducted group or individual video education  with verbal and written material and guidebook.  Patient learns that vegetables, fruits, whole grains, and legumes are the foundations of the Pritikin Eating Plan. Recommendations include how to incorporate each of these in flavorful and healthy salads, and how to create homemade salad dressings. Proper handling of ingredients is also covered. Cooking - Soups and [employer] - Soups and Desserts Clinical staff conducted group or individual video education with verbal and written material and guidebook.  Patient learns that Pritikin soups and desserts make for easy, nutritious, and delicious snacks and meal components that are low in sodium, fat, sugar,  and calorie density, while high in vitamins, minerals, and filling fiber. Recommendations include simple and healthy ideas for soups and desserts.   Overview     The Pritikin Solution Program Overview Clinical staff conducted group or individual video education with verbal and written material and guidebook.  Patient learns that the results of the Pritikin Program have been documented in more than 100 articles published in peer-reviewed journals, and the benefits include reducing risk factors for (and, in some cases, even reversing) high cholesterol, high blood pressure, type 2 diabetes, obesity, and more! An overview of the three key pillars of the Pritikin Program will be covered: eating well, doing regular exercise, and having a healthy mind-set.  WORKSHOPS  Exercise: Exercise Basics: Building Your Action Plan Clinical staff led group instruction and group discussion with PowerPoint presentation and patient guidebook. To enhance the learning environment the use of posters, models and videos may be added. At the conclusion of this workshop, patients will comprehend the difference between physical activity and exercise, as well as the benefits of incorporating both, into their routine. Patients will understand the FITT (Frequency, Intensity, Time, and Type) principle and how to use it to build an exercise action plan. In addition, safety concerns and other considerations for exercise and cardiac rehab will be addressed by the presenter. The purpose of this lesson is to promote a comprehensive and effective weekly exercise routine in order to improve patients' overall level of fitness.   Managing Heart Disease: Your Path to a Healthier Heart Clinical staff led group instruction and group discussion with PowerPoint presentation and patient guidebook. To enhance the learning environment the use of posters, models and videos may be added.At the conclusion of this workshop, patients will understand  the anatomy and physiology of the heart. Additionally, they will understand how Pritikin's three pillars impact the risk factors, the progression, and the management of heart disease.  The purpose of this lesson is to provide a high-level overview of the heart, heart disease, and how the Pritikin lifestyle positively impacts risk factors.  Exercise Biomechanics Clinical staff led group instruction and group discussion with PowerPoint presentation and patient guidebook. To enhance the learning environment the use of posters, models and videos may be added. Patients will learn how the structural parts of their bodies function and how these functions impact their daily activities, movement, and exercise. Patients will learn how to promote a neutral spine, learn how to manage pain, and identify ways to improve their physical movement in order to promote healthy living. The purpose of this lesson is to expose patients to common physical limitations that impact physical activity. Participants will learn practical ways to adapt and manage aches and pains, and to minimize their effect on regular exercise. Patients will learn how to maintain good posture while sitting, walking, and lifting.  Balance Training and Fall Prevention  Clinical staff led group instruction and  group discussion with PowerPoint presentation and patient guidebook. To enhance the learning environment the use of posters, models and videos may be added. At the conclusion of this workshop, patients will understand the importance of their sensorimotor skills (vision, proprioception, and the vestibular system) in maintaining their ability to balance as they age. Patients will apply a variety of balancing exercises that are appropriate for their current level of function. Patients will understand the common causes for poor balance, possible solutions to these problems, and ways to modify their physical environment in order to minimize  their fall risk. The purpose of this lesson is to teach patients about the importance of maintaining balance as they age and ways to minimize their risk of falling.  WORKSHOPS   Nutrition:  Fueling a Ship broker led group instruction and group discussion with PowerPoint presentation and patient guidebook. To enhance the learning environment the use of posters, models and videos may be added. Patients will review the foundational principles of the Pritikin Eating Plan and understand what constitutes a serving size in each of the food groups. Patients will also learn Pritikin-friendly foods that are better choices when away from home and review make-ahead meal and snack options. Calorie density will be reviewed and applied to three nutrition priorities: weight maintenance, weight loss, and weight gain. The purpose of this lesson is to reinforce (in a group setting) the key concepts around what patients are recommended to eat and how to apply these guidelines when away from home by planning and selecting Pritikin-friendly options. Patients will understand how calorie density may be adjusted for different weight management goals.  Mindful Eating  Clinical staff led group instruction and group discussion with PowerPoint presentation and patient guidebook. To enhance the learning environment the use of posters, models and videos may be added. Patients will briefly review the concepts of the Pritikin Eating Plan and the importance of low-calorie dense foods. The concept of mindful eating will be introduced as well as the importance of paying attention to internal hunger signals. Triggers for non-hunger eating and techniques for dealing with triggers will be explored. The purpose of this lesson is to provide patients with the opportunity to review the basic principles of the Pritikin Eating Plan, discuss the value of eating mindfully and how to measure internal cues of hunger and fullness using the  Hunger Scale. Patients will also discuss reasons for non-hunger eating and learn strategies to use for controlling emotional eating.  Targeting Your Nutrition Priorities Clinical staff led group instruction and group discussion with PowerPoint presentation and patient guidebook. To enhance the learning environment the use of posters, models and videos may be added. Patients will learn how to determine their genetic susceptibility to disease by reviewing their family history. Patients will gain insight into the importance of diet as part of an overall healthy lifestyle in mitigating the impact of genetics and other environmental insults. The purpose of this lesson is to provide patients with the opportunity to assess their personal nutrition priorities by looking at their family history, their own health history and current risk factors. Patients will also be able to discuss ways of prioritizing and modifying the Pritikin Eating Plan for their highest risk areas  Menu  Clinical staff led group instruction and group discussion with PowerPoint presentation and patient guidebook. To enhance the learning environment the use of posters, models and videos may be added. Using menus brought in from [employer], or printed from [employer], patients will apply the  Pritikin dining out guidelines that were presented in the [employer]. Patients will also be able to practice these guidelines in a variety of provided scenarios. The purpose of this lesson is to provide patients with the opportunity to practice hands-on learning of the Pritikin Dining Out guidelines with actual menus and practice scenarios.  Label Reading Clinical staff led group instruction and group discussion with PowerPoint presentation and patient guidebook. To enhance the learning environment the use of posters, models and videos may be added. Patients will review and discuss the Pritikin label reading guidelines  presented in Pritikin's Label Reading Educational series video. Using fool labels brought in from local grocery stores and markets, patients will apply the label reading guidelines and determine if the packaged food meet the Pritikin guidelines. The purpose of this lesson is to provide patients with the opportunity to review, discuss, and practice hands-on learning of the Pritikin Label Reading guidelines with actual packaged food labels. Cooking School  Pritikin's [employer] are designed to teach patients ways to prepare quick, simple, and affordable recipes at home. The importance of nutrition's role in chronic disease risk reduction is reflected in its emphasis in the overall Pritikin program. By learning how to prepare essential core Pritikin Eating Plan recipes, patients will increase control over what they eat; be able to customize the flavor of foods without the use of added salt, sugar, or fat; and improve the quality of the food they consume. By learning a set of core recipes which are easily assembled, quickly prepared, and affordable, patients are more likely to prepare more healthy foods at home. These workshops focus on convenient breakfasts, simple entres, side dishes, and desserts which can be prepared with minimal effort and are consistent with nutrition recommendations for cardiovascular risk reduction. Cooking [employer] are taught by a Armed forces logistics/support/administrative officer (RD) who has been trained by the [employer]. The chef or RD has a clear understanding of the importance of minimizing - if not completely eliminating - added fat, sugar, and sodium in recipes. Throughout the series of Cooking School Workshop sessions, patients will learn about healthy ingredients and efficient methods of cooking to build confidence in their capability to prepare    Cooking School weekly topics:  Adding Flavor- Sodium-Free  Fast and Healthy Breakfasts  Powerhouse Plant-Based  Proteins  Satisfying Salads and Dressings  Simple Sides and Sauces  International Cuisine-Spotlight on the [employer] Zones  Delicious Desserts  Savory Soups  [employer] - Meals in a Astronomer Appetizers and Snacks  Comforting Weekend Breakfasts  One-Pot Wonders   Fast Evening Meals  Landscape architect Your Pritikin Plate  WORKSHOPS   Healthy Mindset (Psychosocial):  Focused Goals, Sustainable Changes Clinical staff led group instruction and group discussion with PowerPoint presentation and patient guidebook. To enhance the learning environment the use of posters, models and videos may be added. Patients will be able to apply effective goal setting strategies to establish at least one personal goal, and then take consistent, meaningful action toward that goal. They will learn to identify common barriers to achieving personal goals and develop strategies to overcome them. Patients will also gain an understanding of how our mind-set can impact our ability to achieve goals and the importance of cultivating a positive and growth-oriented mind-set. The purpose of this lesson is to provide patients with a deeper understanding of how to set and achieve personal goals, as well as the tools and strategies needed  to overcome common obstacles which may arise along the way.  From Head to Heart: The Power of a Healthy Outlook  Clinical staff led group instruction and group discussion with PowerPoint presentation and patient guidebook. To enhance the learning environment the use of posters, models and videos may be added. Patients will be able to recognize and describe the impact of emotions and mood on physical health. They will discover the importance of self-care and explore self-care practices which may work for them. Patients will also learn how to utilize the 4 C's to cultivate a healthier outlook and better manage stress and challenges. The purpose of this lesson is to demonstrate  to patients how a healthy outlook is an essential part of maintaining good health, especially as they continue their cardiac rehab journey.  Healthy Sleep for a Healthy Heart Clinical staff led group instruction and group discussion with PowerPoint presentation and patient guidebook. To enhance the learning environment the use of posters, models and videos may be added. At the conclusion of this workshop, patients will be able to demonstrate knowledge of the importance of sleep to overall health, well-being, and quality of life. They will understand the symptoms of, and treatments for, common sleep disorders. Patients will also be able to identify daytime and nighttime behaviors which impact sleep, and they will be able to apply these tools to help manage sleep-related challenges. The purpose of this lesson is to provide patients with a general overview of sleep and outline the importance of quality sleep. Patients will learn about a few of the most common sleep disorders. Patients will also be introduced to the concept of "sleep hygiene," and discover ways to self-manage certain sleeping problems through simple daily behavior changes. Finally, the workshop will motivate patients by clarifying the links between quality sleep and their goals of heart-healthy living.   Recognizing and Reducing Stress Clinical staff led group instruction and group discussion with PowerPoint presentation and patient guidebook. To enhance the learning environment the use of posters, models and videos may be added. At the conclusion of this workshop, patients will be able to understand the types of stress reactions, differentiate between acute and chronic stress, and recognize the impact that chronic stress has on their health. They will also be able to apply different coping mechanisms, such as reframing negative self-talk. Patients will have the opportunity to practice a variety of stress management techniques, such as deep  abdominal breathing, progressive muscle relaxation, and/or guided imagery.  The purpose of this lesson is to educate patients on the role of stress in their lives and to provide healthy techniques for coping with it.  Learning Barriers/Preferences:  Learning Barriers/Preferences - 09/05/23 1400       Learning Barriers/Preferences   Learning Barriers Sight   glasses   Learning Preferences Audio;Computer/Internet;Skilled Demonstration;Group Instruction;Individual Instruction;Pictoral;Verbal Instruction;Video;Written Material          Education Topics:  Knowledge Questionnaire Score:  Knowledge Questionnaire Score - 09/05/23 1411       Knowledge Questionnaire Score   Pre Score 18/24          Core Components/Risk Factors/Patient Goals at Admission:  Personal Goals and Risk Factors at Admission - 09/05/23 1359       Core Components/Risk Factors/Patient Goals on Admission    Weight Management Yes;Obesity;Weight Loss    Intervention Weight Management: Develop a combined nutrition and exercise program designed to reach desired caloric intake, while maintaining appropriate intake of nutrient and fiber, sodium and fats, and appropriate energy  expenditure required for the weight goal.;Weight Management: Provide education and appropriate resources to help participant work on and attain dietary goals.;Weight Management/Obesity: Establish reasonable short term and long term weight goals.;Obesity: Provide education and appropriate resources to help participant work on and attain dietary goals.    Admit Weight 326 lb 8 oz (148.1 kg)    Goal Weight: Long Term 226 lb (102.5 kg)   pt goal   Diabetes Yes    Intervention Provide education about signs/symptoms and action to take for hypo/hyperglycemia.;Provide education about proper nutrition, including hydration, and aerobic/resistive exercise prescription along with prescribed medications to achieve blood glucose in normal ranges: Fasting glucose  65-99 mg/dL    Expected Outcomes Short Term: Participant verbalizes understanding of the signs/symptoms and immediate care of hyper/hypoglycemia, proper foot care and importance of medication, aerobic/resistive exercise and nutrition plan for blood glucose control.;Long Term: Attainment of HbA1C < 7%.    Heart Failure Yes    Intervention Provide a combined exercise and nutrition program that is supplemented with education, support and counseling about heart failure. Directed toward relieving symptoms such as shortness of breath, decreased exercise tolerance, and extremity edema.    Expected Outcomes Improve functional capacity of life;Short term: Attendance in program 2-3 days a week with increased exercise capacity. Reported lower sodium intake. Reported increased fruit and vegetable intake. Reports medication compliance.;Short term: Daily weights obtained and reported for increase. Utilizing diuretic protocols set by physician.;Long term: Adoption of self-care skills and reduction of barriers for early signs and symptoms recognition and intervention leading to self-care maintenance.    Hypertension Yes    Intervention Provide education on lifestyle modifcations including regular physical activity/exercise, weight management, moderate sodium restriction and increased consumption of fresh fruit, vegetables, and low fat dairy, alcohol moderation, and smoking cessation.;Monitor prescription use compliance.    Expected Outcomes Long Term: Maintenance of blood pressure at goal levels.;Short Term: Continued assessment and intervention until BP is < 140/21mm HG in hypertensive participants. < 130/33mm HG in hypertensive participants with diabetes, heart failure or chronic kidney disease.    Lipids Yes    Intervention Provide education and support for participant on nutrition & aerobic/resistive exercise along with prescribed medications to achieve LDL 70mg , HDL >40mg .    Expected Outcomes Short Term:  Participant states understanding of desired cholesterol values and is compliant with medications prescribed. Participant is following exercise prescription and nutrition guidelines.;Long Term: Cholesterol controlled with medications as prescribed, with individualized exercise RX and with personalized nutrition plan. Value goals: LDL < 70mg , HDL > 40 mg.          Core Components/Risk Factors/Patient Goals Review:   Goals and Risk Factor Review     Row Name 09/16/23 1649 10/22/23 1817 11/14/23 0956         Core Components/Risk Factors/Patient Goals Review   Personal Goals Review Weight Management/Obesity;Heart Failure;Lipids;Hypertension;Diabetes Weight Management/Obesity;Heart Failure;Lipids;Hypertension;Diabetes Weight Management/Obesity;Heart Failure;Lipids;Hypertension;Diabetes     Review Pt started cardiac rehab on 09/16/23. Pt is diet-controlled diabetic.  Moderate systolic BP elevation noted during exercise. Alex [name] is doing well with exercise at cardiac rehab. Vital signs have been stable. Alex [name] has lost 1.3 kg since starting cardiac rehab. Alex [name] is doing well with exercise at cardiac rehab. Some  moderate exertional and resting systolic  BP elevations noted. Will continue to monitor. Alex [name] has lost 1.6 kg since starting cardiac rehab. Alex [name] will complete cardiac rehab on 11/27/23.     Expected Outcomes Pt will continue to participate in cardiac rehab for exercise, nutrition, and  lifestyle modifications. Pt will continue to participate in cardiac rehab for exercise, nutrition, and lifestyle modifications. Pt will continue to participate in cardiac rehab for exercise, nutrition, and lifestyle modifications.        Core Components/Risk Factors/Patient Goals at Discharge (Final Review):   Goals and Risk Factor Review - 11/14/23 0956       Core Components/Risk Factors/Patient Goals Review   Personal Goals Review Weight Management/Obesity;Heart Failure;Lipids;Hypertension;Diabetes    Review  Alex [name] is doing well with exercise at cardiac rehab. Some  moderate exertional and resting systolic  BP elevations noted. Will continue to monitor. Alex [name] has lost 1.6 kg since starting cardiac rehab. Alex [name] will complete cardiac rehab on 11/27/23.    Expected Outcomes Pt will continue to participate in cardiac rehab for exercise, nutrition, and lifestyle modifications.          ITP Comments:  ITP Comments     Row Name 09/05/23 1253 09/16/23 1645 10/22/23 1815 11/14/23 9047     ITP Comments Dr. Wilbert Bihari medical director. Introduction to pritikin education/intensive cardiac rehab. Initial orientation packet reviewed with patient. 30 Day ITP review. Jotham started cardiac rehab on 09/16/23. Pt did fair with exercise.  Pt is somewhat deconditioned. 30 Day ITP review. Alpheus has good participation in cardiac rehab when in attendance. 30 Day ITP review. Alex [name] continue to have  good participation in cardiac rehab when in attendance. Alex [name] will complete cardiac rehab on 11/27/23       Comments: See ITP comments.[name] RN BSN

## 2023-11-15 ENCOUNTER — Encounter (HOSPITAL_COMMUNITY): Payer: Medicare (Managed Care)

## 2023-11-15 ENCOUNTER — Encounter (HOSPITAL_COMMUNITY)
Admission: RE | Admit: 2023-11-15 | Discharge: 2023-11-15 | Disposition: A | Discharge status: Home / Self Care | Source: Ambulatory Visit | Attending: Cardiovascular Disease | Admitting: Cardiovascular Disease

## 2023-11-15 DIAGNOSIS — Z48812 Encounter for surgical aftercare following surgery on the circulatory system: Secondary | ICD-10-CM | POA: Diagnosis not present

## 2023-11-18 ENCOUNTER — Encounter (HOSPITAL_COMMUNITY): Payer: Medicare (Managed Care)

## 2023-11-18 ENCOUNTER — Encounter (HOSPITAL_COMMUNITY)
Admission: RE | Admit: 2023-11-18 | Discharge: 2023-11-18 | Disposition: A | Discharge status: Home / Self Care | Source: Ambulatory Visit | Attending: Cardiovascular Disease

## 2023-11-18 DIAGNOSIS — Z955 Presence of coronary angioplasty implant and graft: Secondary | ICD-10-CM

## 2023-11-18 DIAGNOSIS — Z48812 Encounter for surgical aftercare following surgery on the circulatory system: Secondary | ICD-10-CM | POA: Diagnosis not present

## 2023-11-20 ENCOUNTER — Encounter (HOSPITAL_COMMUNITY): Payer: Medicare (Managed Care)

## 2023-11-20 ENCOUNTER — Encounter (HOSPITAL_COMMUNITY)
Admission: RE | Admit: 2023-11-20 | Discharge: 2023-11-20 | Disposition: A | Discharge status: Home / Self Care | Source: Ambulatory Visit | Attending: Cardiovascular Disease | Admitting: Cardiovascular Disease

## 2023-11-20 DIAGNOSIS — Z48812 Encounter for surgical aftercare following surgery on the circulatory system: Secondary | ICD-10-CM | POA: Diagnosis not present

## 2023-11-20 DIAGNOSIS — Z955 Presence of coronary angioplasty implant and graft: Secondary | ICD-10-CM

## 2023-11-21 ENCOUNTER — Telehealth: Payer: Self-pay

## 2023-11-21 NOTE — Telephone Encounter (Signed)
 Patient LVM on nurse line requesting to speak with nurse regarding sharp pain in his head.   Returned call to patient. Patient reports that for the last 3-4 weeks, he has been experiencing intermittent episodes of sharp pains in his head. Denies associated visual changes, nausea, vomiting, speech difficulties, balance issues, facial drooping.   Patient called in today because he feels like episodes are happening more often and would like to have concern evaluated.   Scheduled patient in ATC tomorrow afternoon.   ED precautions discussed.   Chiquita JAYSON English, RN

## 2023-11-22 ENCOUNTER — Ambulatory Visit

## 2023-11-22 ENCOUNTER — Encounter (HOSPITAL_COMMUNITY): Payer: Medicare (Managed Care)

## 2023-11-22 ENCOUNTER — Encounter (HOSPITAL_COMMUNITY)
Admission: RE | Admit: 2023-11-22 | Discharge: 2023-11-22 | Disposition: A | Discharge status: Home / Self Care | Source: Ambulatory Visit | Attending: Cardiovascular Disease | Admitting: Cardiovascular Disease

## 2023-11-22 VITALS — BP 138/102 | HR 72 | Ht 67.0 in | Wt 324.0 lb

## 2023-11-22 VITALS — Ht 67.0 in | Wt 326.3 lb

## 2023-11-22 DIAGNOSIS — G500 Trigeminal neuralgia: Secondary | ICD-10-CM | POA: Diagnosis not present

## 2023-11-22 DIAGNOSIS — R519 Headache, unspecified: Secondary | ICD-10-CM

## 2023-11-22 DIAGNOSIS — Z955 Presence of coronary angioplasty implant and graft: Secondary | ICD-10-CM

## 2023-11-22 DIAGNOSIS — Z48812 Encounter for surgical aftercare following surgery on the circulatory system: Secondary | ICD-10-CM | POA: Diagnosis not present

## 2023-11-22 LAB — POCT SEDIMENTATION RATE: POCT SED RATE: 39 mm/h — AB (ref 0–22)

## 2023-11-22 MED ORDER — CARBAMAZEPINE ER 100 MG PO CP12: 100.0000 mg | ORAL_CAPSULE | Freq: Two times a day (BID) | ORAL | 0 refills | Status: DC

## 2023-11-22 NOTE — Patient Instructions (Addendum)
 Good to see you today - Thank you for coming in  Things we discussed today:  1) Your sharp head pains are most likely due to trigeminal neuralgia, which is irritation of your trigeminal nerve that controls that part of your scalp.  - Start taking Carbamezapine 100mg  twice a day - Come back to see me in 1 week. We will likely have to go up on the medication to get better control of your pain.  2) We will schedule your for MRI of your brain. We will call you with the appointment.   Come back to see me in 1 week

## 2023-11-22 NOTE — Progress Notes (Signed)
    SUBJECTIVE:   CHIEF COMPLAINT / HPI:   EM is a 68yo M that pf head pains.  - For past 2-3 weeks, has been having sharp pain on R side of head. - Feels like its getting progressively more frequent - Pain is 7/10.  - The pain seems random, no clear triggers. Not related to a time of time.  - Pain comes on suddenly, lasts for 2-3 seconds, then go away.  - The episodes come about 5 times a day.  - Denies vision changes during episodes, denies dizziness - Denies fevers - Deniew hx of similar symptoms - Denies weakness durimg  PERTINENT  PMH / PSH: T2DM, CAD  OBJECTIVE:   BP (!) 138/102   Pulse 72   Ht 5' 7 (1.702 m)   Wt (!) 324 lb (147 kg)   SpO2 99%   BMI 50.75 kg/m   General: Alert, pleasant man. NAD. Neuro:  CN2: no vision changes CN3,4,6: PERRLA. EOMI CN5: Sensation intact BL CN7: Facial expressions symmetric CN8: Hearing intact BL CN9: palate symmetric  CN10: regular speech CN11: turns head against resistance CN12: tongue midline  HEENT: NCAT. MMM.  CV: RRR, no murmurs.  Resp: CTAB, no wheezing or crackles. Normal WOB on RA.  Abm: Soft, nontender, nondistended. BS present. Ext: Moves all ext spontaneously Skin: Warm, well perfused   ASSESSMENT/PLAN:   Assessment & Plan Nonintractable episodic headache, unspecified headache type Differential includes trigeminal neuralgia, cluster headaches, GCA, mass effect. Trigeminal neuralgia is most likely given duration of symptoms (seconds), stabbing/sharp nature, and no neurologic deficit during. Cluster less likely given short duration of symptoms. GCA also less likely given no visual effects and short duration of pain episodes. Mass effect is considered, but no focal deficits noted on exam. Pain is in the V1 distribution of R side face. - MRI brain w/wo con - MRA brain - Start carbamezapine 100mg  BID  - f/u in 1 week. Increase as needed - ESR - Return precautions discussed Trigeminal neuralgia      Twyla Nearing, MD [facility]

## 2023-11-24 DIAGNOSIS — G500 Trigeminal neuralgia: Secondary | ICD-10-CM | POA: Insufficient documentation

## 2023-11-24 NOTE — Assessment & Plan Note (Signed)
 Differential includes trigeminal neuralgia, cluster headaches, GCA, mass effect. Trigeminal neuralgia is most likely given duration of symptoms (seconds), stabbing/sharp nature, and no neurologic deficit during. Cluster less likely given short duration of symptoms. GCA also less likely given no visual effects and short duration of pain episodes. Mass effect is considered, but no focal deficits noted on exam. Pain is in the V1 distribution of R side face. - MRI brain w/wo con - MRA brain - Start carbamezapine 100mg  BID  - f/u in 1 week. Increase as needed - ESR - Return precautions discussed

## 2023-11-25 ENCOUNTER — Encounter (HOSPITAL_COMMUNITY): Payer: Medicare (Managed Care)

## 2023-11-25 ENCOUNTER — Encounter (HOSPITAL_COMMUNITY)
Admission: RE | Admit: 2023-11-25 | Discharge: 2023-11-25 | Disposition: A | Discharge status: Home / Self Care | Source: Ambulatory Visit | Attending: Cardiovascular Disease | Admitting: Cardiovascular Disease

## 2023-11-25 DIAGNOSIS — Z955 Presence of coronary angioplasty implant and graft: Secondary | ICD-10-CM

## 2023-11-25 DIAGNOSIS — Z48812 Encounter for surgical aftercare following surgery on the circulatory system: Secondary | ICD-10-CM | POA: Diagnosis not present

## 2023-11-25 NOTE — Progress Notes (Signed)
 Discharge Progress Report  Patient Details  Name: LUNDY COZART MRN: [id number] Date of Birth: 02-11-1956 Referring Provider:   Flowsheet Row CARDIAC REHAB PHASE II ORIENTATION from 09/05/2023 in [facility]  Referring Provider Lonni Cash, MD     Number of Visits: 24  Reason for Discharge:  Patient reached a stable level of exercise. Patient independent in their exercise. Patient has met program and personal goals.  Smoking History:  Social History   Tobacco Use  Smoking Status Former   Current packs/day: 0.00   Average packs/day: 1.5 packs/day for 10.0 years (15.0 ttl pk-yrs)   Types: Cigarettes   Start date: 11/05/1981   Quit date: 11/06/1991   Years since quitting: 32.1  Smokeless Tobacco Never  Tobacco Comments   quit 1993    Diagnosis:  08/14/23 DES LAD  ADL UCSD:   Initial Exercise Prescription:  Initial Exercise Prescription - 09/05/23 1500       Date of Initial Exercise RX and Referring Provider   Date 09/05/23    Referring Provider Lonni Cash, MD    Expected Discharge Date 11/27/23      NuStep   Level 1    SPM 70    Minutes 15    METs 1.5      Track   Laps 13    Minutes 15    METs 1.5      Prescription Details   Frequency (times per week) 3    Duration Progress to 30 minutes of continuous aerobic without signs/symptoms of physical distress      Intensity   THRR 40-80% of Max Heartrate 61-122    Ratings of Perceived Exertion 11-13    Perceived Dyspnea 0-4      Progression   Progression Continue progressive overload as per policy without signs/symptoms or physical distress.      Resistance Training   Training Prescription Yes    Weight 3    Reps 10-15          Discharge Exercise Prescription (Final Exercise Prescription Changes):  Exercise Prescription Changes - 11/27/23 1306       Response to Exercise   Blood Pressure (Admit) 124/82    Blood Pressure  (Exit) 122/84    Heart Rate (Admit) 85 bpm    Heart Rate (Exercise) 121 bpm    Heart Rate (Exit) 92 bpm    Rating of Perceived Exertion (Exercise) 13    Symptoms None    Comments Pt graduated from the CRP2 program today    Duration Continue with 30 min of aerobic exercise without signs/symptoms of physical distress.    Intensity THRR unchanged      Progression   Progression Continue to progress workloads to maintain intensity without signs/symptoms of physical distress.    Average METs 2.3      Resistance Training   Training Prescription No    Weight No wts on wednesdays      Interval Training   Interval Training No      Arm Ergometer   Level 4    Watts 25    Minutes 15    METs 1.9      Track   Laps 12   pt had to take bathroom break   Minutes 15    METs 2.53      Home Exercise Plan   Plans to continue exercise at [employer] (comment)    Frequency Add 2 additional days to program  exercise sessions.    Initial Home Exercises Provided 11/06/23          Functional Capacity:  6 Minute Walk     Row Name 09/05/23 1528 11/22/23 0916       6 Minute Walk   Phase Initial Discharge    Distance 1140 feet 1835 feet    Distance % Change -- 61 %    Distance Feet Change -- 695 ft    Walk Time 6 minutes 6 minutes    # of Rest Breaks 0 0    MPH 2.16 3.5    METS 1.55 3.1    RPE 14 11    Perceived Dyspnea  0 0    VO2 Peak 5.42 10.9    Symptoms Yes (comment) No    Comments no pain, a little winded. resolved with rest --    Resting HR 63 bpm 77 bpm    Resting BP 112/86 138/80    Resting Oxygen Saturation  97 % --    Exercise Oxygen Saturation  during 6 min walk 97 % --    Max Ex. HR 110 bpm 132 bpm    Max Ex. BP 142/82 154/84    2 Minute Post BP 122/86 --       Psychological, QOL, Others - Outcomes: PHQ 2/9:    11/25/2023    9:49 AM 11/22/2023    2:53 PM 11/04/2023    9:44 AM 10/10/2023    4:16 PM 09/05/2023    1:58 PM  Depression screen PHQ 2/9   Decreased Interest 0 0 0 0 0  Down, Depressed, Hopeless 0 0 0 0 0  PHQ - 2 Score 0 0 0 0 0  Altered sleeping 0 0 0 0 0  Tired, decreased energy 1 0 0 0 0  Change in appetite 0 0 0 0 0  Feeling bad or failure about yourself  0 0 0 0 0  Trouble concentrating 0 0 0 0 0  Moving slowly or fidgety/restless 0 0 0 0 0  Suicidal thoughts 0 0 0 0 0  PHQ-9 Score 1 0 0 0 0  Difficult doing work/chores Not difficult at all  Not difficult at all Not difficult at all Not difficult at all    Quality of Life:  Quality of Life - 11/20/23 0825       Quality of Life Scores   Health/Function Pre 22.47 %    Health/Function Post 23.14 %    Health/Function % Change 2.98 %    Socioeconomic Pre 27.25 %    Socioeconomic Post 23.79 %    Socioeconomic % Change  -12.7 %    Psych/Spiritual Pre 26.07 %    Psych/Spiritual Post 24.86 %    Psych/Spiritual % Change -4.64 %    Family Pre 24.7 %    Family Post 28.5 %    Family % Change 15.38 %    GLOBAL Pre 24.6 %    GLOBAL Post 24.33 %    GLOBAL % Change -1.1 %          Personal Goals: Goals established at orientation with interventions provided to work toward goal.  Personal Goals and Risk Factors at Admission - 09/05/23 1359       Core Components/Risk Factors/Patient Goals on Admission    Weight Management Yes;Obesity;Weight Loss    Intervention Weight Management: Develop a combined nutrition and exercise program designed to reach desired caloric intake, while maintaining appropriate intake of nutrient and fiber, sodium and  fats, and appropriate energy expenditure required for the weight goal.;Weight Management: Provide education and appropriate resources to help participant work on and attain dietary goals.;Weight Management/Obesity: Establish reasonable short term and long term weight goals.;Obesity: Provide education and appropriate resources to help participant work on and attain dietary goals.    Admit Weight 326 lb 8 oz (148.1 kg)    Goal  Weight: Long Term 226 lb (102.5 kg)   pt goal   Diabetes Yes    Intervention Provide education about signs/symptoms and action to take for hypo/hyperglycemia.;Provide education about proper nutrition, including hydration, and aerobic/resistive exercise prescription along with prescribed medications to achieve blood glucose in normal ranges: Fasting glucose 65-99 mg/dL    Expected Outcomes Short Term: Participant verbalizes understanding of the signs/symptoms and immediate care of hyper/hypoglycemia, proper foot care and importance of medication, aerobic/resistive exercise and nutrition plan for blood glucose control.;Long Term: Attainment of HbA1C < 7%.    Heart Failure Yes    Intervention Provide a combined exercise and nutrition program that is supplemented with education, support and counseling about heart failure. Directed toward relieving symptoms such as shortness of breath, decreased exercise tolerance, and extremity edema.    Expected Outcomes Improve functional capacity of life;Short term: Attendance in program 2-3 days a week with increased exercise capacity. Reported lower sodium intake. Reported increased fruit and vegetable intake. Reports medication compliance.;Short term: Daily weights obtained and reported for increase. Utilizing diuretic protocols set by physician.;Long term: Adoption of self-care skills and reduction of barriers for early signs and symptoms recognition and intervention leading to self-care maintenance.    Hypertension Yes    Intervention Provide education on lifestyle modifcations including regular physical activity/exercise, weight management, moderate sodium restriction and increased consumption of fresh fruit, vegetables, and low fat dairy, alcohol moderation, and smoking cessation.;Monitor prescription use compliance.    Expected Outcomes Long Term: Maintenance of blood pressure at goal levels.;Short Term: Continued assessment and intervention until BP is < 140/64mm HG  in hypertensive participants. < 130/36mm HG in hypertensive participants with diabetes, heart failure or chronic kidney disease.    Lipids Yes    Intervention Provide education and support for participant on nutrition & aerobic/resistive exercise along with prescribed medications to achieve LDL 70mg , HDL >40mg .    Expected Outcomes Short Term: Participant states understanding of desired cholesterol values and is compliant with medications prescribed. Participant is following exercise prescription and nutrition guidelines.;Long Term: Cholesterol controlled with medications as prescribed, with individualized exercise RX and with personalized nutrition plan. Value goals: LDL < 70mg , HDL > 40 mg.           Personal Goals Discharge:  Goals and Risk Factor Review     Row Name 09/16/23 1649 10/22/23 1817 11/14/23 0956         Core Components/Risk Factors/Patient Goals Review   Personal Goals Review Weight Management/Obesity;Heart Failure;Lipids;Hypertension;Diabetes Weight Management/Obesity;Heart Failure;Lipids;Hypertension;Diabetes Weight Management/Obesity;Heart Failure;Lipids;Hypertension;Diabetes     Review Pt started cardiac rehab on 09/16/23. Pt is diet-controlled diabetic.  Moderate systolic BP elevation noted during exercise. Adley is doing well with exercise at cardiac rehab. Vital signs have been stable. Bralynn has lost 1.3 kg since starting cardiac rehab. Marcia is doing well with exercise at cardiac rehab. Some  moderate exertional and resting systolic  BP elevations noted. Will continue to monitor. Willow has lost 1.6 kg since starting cardiac rehab. Abhinav will complete cardiac rehab on 11/27/23.     Expected Outcomes Pt will continue to participate in cardiac rehab for exercise,  nutrition, and lifestyle modifications. Pt will continue to participate in cardiac rehab for exercise, nutrition, and lifestyle modifications. Pt will continue to participate in cardiac rehab for exercise, nutrition,  and lifestyle modifications.        Exercise Goals and Review:  Exercise Goals     Row Name 09/05/23 1254             Exercise Goals   Increase Physical Activity Yes       Intervention Provide advice, education, support and counseling about physical activity/exercise needs.;Develop an individualized exercise prescription for aerobic and resistive training based on initial evaluation findings, risk stratification, comorbidities and participant's personal goals.       Expected Outcomes Short Term: Attend rehab on a regular basis to increase amount of physical activity.;Long Term: Exercising regularly at least 3-5 days a week.;Long Term: Add in home exercise to make exercise part of routine and to increase amount of physical activity.       Increase Strength and Stamina Yes       Intervention Develop an individualized exercise prescription for aerobic and resistive training based on initial evaluation findings, risk stratification, comorbidities and participant's personal goals.;Provide advice, education, support and counseling about physical activity/exercise needs.       Expected Outcomes Short Term: Increase workloads from initial exercise prescription for resistance, speed, and METs.;Short Term: Perform resistance training exercises routinely during rehab and add in resistance training at home;Long Term: Improve cardiorespiratory fitness, muscular endurance and strength as measured by increased METs and functional capacity ( )       Able to understand and use rate of perceived exertion (RPE) scale Yes       Intervention Provide education and explanation on how to use RPE scale       Expected Outcomes Short Term: Able to use RPE daily in rehab to express subjective intensity level;Long Term:  Able to use RPE to guide intensity level when exercising independently       Knowledge and understanding of Target Heart Rate Range (THRR) Yes       Intervention Provide education and explanation of  THRR including how the numbers were predicted and where they are located for reference       Expected Outcomes Short Term: Able to state/look up THRR;Short Term: Able to use daily as guideline for intensity in rehab;Long Term: Able to use THRR to govern intensity when exercising independently       Understanding of Exercise Prescription Yes       Intervention Provide education, explanation, and written materials on patient's individual exercise prescription       Expected Outcomes Long Term: Able to explain home exercise prescription to exercise independently;Short Term: Able to explain program exercise prescription          Exercise Goals Re-Evaluation:  Exercise Goals Re-Evaluation     Row Name 09/16/23 1642 11/15/23 1528 11/27/23 1309         Exercise Goal Re-Evaluation   Exercise Goals Review Increase Physical Activity;Increase Strength and Stamina;Able to understand and use rate of perceived exertion (RPE) scale;Knowledge and understanding of Target Heart Rate Range (THRR);Understanding of Exercise Prescription Increase Physical Activity;Increase Strength and Stamina;Able to understand and use rate of perceived exertion (RPE) scale;Knowledge and understanding of Target Heart Rate Range (THRR);Understanding of Exercise Prescription Increase Physical Activity;Increase Strength and Stamina;Able to understand and use rate of perceived exertion (RPE) scale;Knowledge and understanding of Target Heart Rate Range (THRR);Understanding of Exercise Prescription     Comments Pt's  first day in the CRP2 program. Pt understands the exercise Rx, RPE sclae and THRR. Reviewed goals with patient today. Pt is making good progress on his goals of imrpoved strength and stamina. Pt also has more energy, which is also a goal. Pt has goal of weight loss, he has lost about 2 kg. Pt graduated from the CRP2 program today. Pt voices making good progress on his goals of improved strength and stamina. Pt also has more  energy, which was also a goal. Pt has goal of weight loss, he has lost about 3 lbs.     Expected Outcomes Will continue to monitor and progress exercise workloads as tolerated. Will continue to monitor and progress exercise workloads as tolerated. Pt will continue to exercise and home and the [facility] by walking.        Nutrition & Weight - Outcomes:  Pre Biometrics - 09/05/23 1252       Pre Biometrics   Waist Circumference 59 inches    Hip Circumference 53 inches    Waist to Hip Ratio 1.11 %    Triceps Skinfold 48 mm    % Body Fat 50.8 %    Grip Strength 29 kg    Flexibility 9 in    Single Leg Stand 1 seconds          Post Biometrics - 11/22/23 0925        Post  Biometrics   Height 5' 7 (1.702 m)    Weight 148 kg    Waist Circumference 59 inches    Hip Circumference 53 inches    Waist to Hip Ratio 1.11 %    BMI (Calculated) 51.09    Triceps Skinfold 47 mm    % Body Fat 50.7 %    Grip Strength 28 kg    Flexibility 9.1 in    Single Leg Stand 2.3 seconds          Nutrition:  Nutrition Therapy & Goals - 10/16/23 0857       Nutrition Therapy   Diet Heart Healthy Diet      Personal Nutrition Goals   Nutrition Goal Patient to identify strategies for reducing cardiovascular risk by attending the [facility] education and nutrition series weekly.   goal in progress.   Personal Goal #2 Patient to improve diet quality by using the plate method as a guide for meal planning to include lean protein/plant protein, fruits, vegetables, whole grains, nonfat dairy as part of a well-balanced diet.   goal in progress.   Personal Goal #3 Patient to identify strategies for weight loss with goal of 0.5-2.0# per week.   goal not met.   Comments Goals in progress.Patient has medical history DES LAD, chronic HFpEF, HTN, hyperlipidemia. He continues to attend the [facility] education/nutrition series regularly. He is currently taking repatha  due to statin intolerance; LDL is well controlled. A1c is  in a pre-diabetic range. Have discussed strategies for weight loss including calorie density, the plate method as a guide for meal planning, label reading, etc. He has maintained his weight since starting with our program. Patient will benefit from participation in intensive cardiac rehab for nutrition education, exercise, and lifestyle modification.      Intervention Plan   Intervention Prescribe, educate and counsel regarding individualized specific dietary modifications aiming towards targeted core components such as weight, hypertension, lipid management, diabetes, heart failure and other comorbidities.;Nutrition handout(s) given to patient.    Expected Outcomes Short Term Goal: Understand basic principles of dietary content, such  as calories, fat, sodium, cholesterol and nutrients.;Long Term Goal: Adherence to prescribed nutrition plan.          Nutrition Discharge:  Nutrition Assessments - 09/16/23 1046       Rate Your Plate Scores   Pre Score 39          Education Questionnaire Score:  Knowledge Questionnaire Score - 11/20/23 0821       Knowledge Questionnaire Score   Post Score 21/28          Goals reviewed with patient; copy given to patient.Pt graduates from  Intensive/Traditional cardiac rehab program on 11/27/23  with completion of 24  exercise and 24  education sessions. Pt maintained good attendance and progressed nicely during their participation in rehab as evidenced by increased MET level. Azariah increased his distance on his post exercise walk test by 695 feet and lost 1.5 kg while enrolled in the program.  Medication list reconciled. Repeat  PHQ score- 1 .  Pt has made significant lifestyle changes and should be commended for their success. Joanthony achieved his goals during cardiac rehab.   Pt plans to continue exercise at home walking and going to the [facility]. We are proud of Shandon's progress![name] RN BSN

## 2023-11-27 ENCOUNTER — Encounter (HOSPITAL_COMMUNITY): Payer: Medicare (Managed Care)

## 2023-11-27 ENCOUNTER — Encounter (HOSPITAL_COMMUNITY)
Admission: RE | Admit: 2023-11-27 | Discharge: 2023-11-27 | Disposition: A | Discharge status: Home / Self Care | Source: Ambulatory Visit | Attending: Cardiovascular Disease

## 2023-11-27 DIAGNOSIS — Z955 Presence of coronary angioplasty implant and graft: Secondary | ICD-10-CM

## 2023-11-27 DIAGNOSIS — Z48812 Encounter for surgical aftercare following surgery on the circulatory system: Secondary | ICD-10-CM | POA: Diagnosis not present

## 2023-12-03 ENCOUNTER — Other Ambulatory Visit: Payer: Self-pay

## 2023-12-03 NOTE — Progress Notes (Signed)
 " Cardiology Office Note    Patient Name: Alex [name] Date of Encounter: 12/04/2023  Primary Care Provider:  Elicia Hamlet, MD Primary Cardiologist:  Lonni Cash, MD Primary Electrophysiologist: None   Past Medical History    Past Medical History:  Diagnosis Date   Asthma    CAD (coronary artery disease)    Cardiac cath 2002 with 25% distal RCA stenosis.    DDD (degenerative disc disease), lumbar    Diabetes mellitus (HCC)    Diverticulitis    Esophageal stricture    Gastritis 2010   GERD (gastroesophageal reflux disease)    GI bleed    Gout    HTN (hypertension)    Hyperlipidemia    Insomnia    MI (myocardial infarction) (HCC) 2009   no stent placement   OA (osteoarthritis)    Obesity    OSA on CPAP    Seasonal allergies    TIA (transient ischemic attack) 2013    History of Present Illness  Alex [name] is a 68 y.o. male with a PMH of CAD s/p LHC with 70% stenosed mid to proximal RCA, 90% stenosis mid LAD treated with PCI/DES x 1, DM type II, HTN, HLD, statin intolerance, HFpEF, TIA, obesity, OSA, GERD, gout who presents today for 51-month follow-up  Alex [name] was last seen on 08/21/2023 following his recent left heart cath and stent placement.  During his visit he had no chest pain but noted some shortness of breath.  He was cleared for cardiac rehab and started the program following our visit.  He was noted to have stable blood pressures and had no other complaints at that time.  He was seen by his PCP with complaint of sharp pain in the head and is scheduled to undergo an MRI and was started on carbamazepine .  He also completed 2D echo in 09/2023 that showed normal EF with mild concentric LVH and grade 1 DD moderate dilation of the aorta which was checked with CT and plan for repeat in 2026.  Alex [name] presents today for 57-month follow-up. He has completed cardiac rehabilitation but continues to experience shortness of breath and occasional chest pain. The  chest pain lasts only a second or two, and he is unsure if it occurs during activity or rest. There is no associated tightness, heaviness, or feelings similar to previous episodes. No significant chest pain has occurred since June. He experiences shortness of breath with minimal exertion, such as taking out the garbage. He is currently taking Effient  and aspirin . He has noticed bruising on his stomach, which he attributes to Effient . He experienced sharp head pain, for which his primary care doctor prescribed carbamazepine . After one dose, his headache worsened, leading to discontinuation of the medication. An MRI is scheduled for Friday to further investigate the cause of the headaches. No visual changes are associated with the headaches. An echocardiogram in July showed moderate dilation of the aorta. He is on irbesartan  (Avapro ) and toprol  for blood pressure management and has been monitoring his blood pressure at home. He reports difficulty using his left arm due to arthritis, which worsened after taking Repatha . He has not discussed this with his primary doctor yet. He has tried Tylenol  without relief and is considering other conservative measures such as ice, heat, and Voltaren gel. He plans to join a gym to continue exercising after cardiac rehabilitation but feels the need to rest and let his body recover. Patient denies chest pain, palpitations, dyspnea, PND, orthopnea, nausea, vomiting,  dizziness, syncope, edema, weight gain, or early satiety.  Discussed the use of AI scribe software for clinical note transcription with the patient, who gave verbal consent to proceed.  History of Present Illness   Review of Systems  Please see the history of present illness.    All other systems reviewed and are otherwise negative except as noted above.  Physical Exam    Wt Readings from Last 3 Encounters:  12/04/23 (!) 318 lb (144.2 kg)  11/22/23 (!) 326 lb 4.5 oz (148 kg)  11/22/23 (!) 324 lb (147 kg)    VS: Vitals:   12/04/23 0903 12/04/23 0951  BP: (!) 140/70 (!) 132/94  Pulse: 70   SpO2: 96%   ,Body mass index is 49.81 kg/m. GEN: Well nourished, well developed in no acute distress Neck: No JVD; No carotid bruits Pulmonary: Clear to auscultation without rales, wheezing or rhonchi  Cardiovascular: Normal rate. Regular rhythm. Normal S1. Normal S2.   Murmurs: There is no murmur.  ABDOMEN: Soft, non-tender, non-distended EXTREMITIES: Trace bilateral edema y   EKG/LABS/ Recent Cardiac Studies   ECG personally reviewed by me today -none completed today  Risk Assessment/Calculations:          Lab Results  Component Value Date   WBC 8.6 08/15/2023   HGB 11.7 (L) 08/15/2023   HCT 36.0 (L) 08/15/2023   MCV 81.4 08/15/2023   PLT 246 08/15/2023   Lab Results  Component Value Date   CREATININE 1.07 08/15/2023   BUN 15 08/15/2023   NA 137 08/15/2023   K 3.8 08/15/2023   CL 105 08/15/2023   CO2 25 08/15/2023   Lab Results  Component Value Date   CHOL 115 02/18/2023   HDL 30 (L) 02/18/2023   LDLCALC 50 02/18/2023   LDLDIRECT 130 (H) 02/22/2012   TRIG 213 (H) 02/18/2023   CHOLHDL 3.8 02/18/2023    Lab Results  Component Value Date   HGBA1C 6.4 11/04/2023   Assessment & Plan    Assessment and Plan Assessment & Plan Atherosclerotic heart disease with prior stent placement Intermittent chest pain possibly activity-related. Effient  necessary to maintain stent patency despite bruising. - Continue Effient  and aspirin  without missing doses. - Use nitroglycerin  tablets if chest pain does not resolve with rest or lasts longer. - Monitor for reproducible chest pain with activity and report if it occurs.  Aneurysm of ascending aorta, without rupture Moderate aortic dilation noted. Blood pressure control essential to prevent progression. - Monitor blood pressure at home and maintain control. - Follow up with repeat CT scan in 2026.  Essential hypertension Blood pressure  control critical due to aortic aneurysm. Current medications include Avapro  and Toprol . Blood pressure was 132/94 mmHg without morning medication. - Monitor blood pressure at home and document readings. - Check blood pressure two hours after taking medication and in the evening. - Provide blood pressure log for documentation. - Reassess medication regimen based on home blood pressure readings.  Chronic diastolic heart failure No changes in management during this visit.  Pure hypercholesterolemia, intolerant to statins, on PCSK9 inhibitor therapy Left shoulder pain possibly linked to Repatha . Repatha  essential due to statin intolerance. - Monitor for joint pain after next Repatha  dose. - Contact provider if joint pain recurs. - Consider alternatives if joint pain persists.  Left shoulder osteoarthritis Left shoulder pain possibly arthritic, worsened after Repatha . - Use Voltaren gel for pain relief. - Alternate ice and heat therapy. - Consider follow-up with primary care for further evaluation and possible  referral to sports medicine.    1.Coronary artery disease: -s/p LHC with  70% stenosed mid to proximal RCA, 90% stenosis mid LAD treated with PCI/DES x 1 -Today reports occasional intermittent chest pain possibly activity-related. - Patient was advised to continue to monitor contact us  if it increases in intensity - Continue current GDMT with ASA 81 mg, Effient  10 mg, Toprol -XL 25 mg, Repatha  140 mg q. 14 days and as needed Nitrostat  0.4 mg - Recently completed cardiac rehab and encouraged to continue physical activity of at least 150 minutes a week  2.Chronic HFpEF: - Patient last 2D echo was completed showing EF of 55 to 60% with no RWMA and mild concentric LVH with grade 1 DD - Patient is euvolemic on examination today and reports no shortness of breath with exertion. - Continue Toprol -XL 25 mg -Low sodium diet, fluid restriction <2L, and daily weights encouraged. Educated to  contact our office for weight gain of 2 lbs overnight or 5 lbs in one week.   3.  Essential hypertension: -.HYPERTENSION CONTROL Vitals:   12/04/23 0903 12/04/23 0951  BP: (!) 140/70 (!) 132/94    The patient's blood pressure is elevated above target today.  In order to address the patient's elevated BP: Blood pressure will be monitored at home to determine if medication changes need to be made.; Follow up with general cardiology has been recommended.     - Continue Avapro  150 mg daily, Norvasc  10 mg daily Toprol -XL 25 mg daily - Advised to monitor BP over 2-week period and submit readings back to our office.  4.  Ascending aortic dilation: -Moderate aortic dilation noted. Blood pressure control essential to prevent progression. - Monitor blood pressure at home and maintain control. - Follow up with repeat CT scan in 2026.  5.  Hyperlipidemia: -Patient reported left shoulder pain following most recent Repatha  injection. - Patient's last LDL cholesterol was controlled at 50 - Continue Repatha  140 mg q. 14 days - Monitor for joint pain after next Repatha  dose. - Contact provider if joint pain recurs. - Consider alternatives if joint pain persists.   Disposition: Follow-up with Lonni Cash, MD or APP in 6 months    Signed, Wyn Raddle, Jackee Shove, NP 12/04/2023, 12:02 PM [facility] "

## 2023-12-04 ENCOUNTER — Encounter: Payer: Self-pay | Admitting: Nurse Practitioner

## 2023-12-04 ENCOUNTER — Ambulatory Visit: Payer: Medicare (Managed Care) | Attending: Cardiology | Admitting: Nurse Practitioner

## 2023-12-04 VITALS — BP 132/94 | HR 70 | Ht 67.0 in | Wt 318.0 lb

## 2023-12-04 DIAGNOSIS — I10 Essential (primary) hypertension: Secondary | ICD-10-CM | POA: Diagnosis not present

## 2023-12-04 DIAGNOSIS — I25110 Atherosclerotic heart disease of native coronary artery with unstable angina pectoris: Secondary | ICD-10-CM

## 2023-12-04 DIAGNOSIS — I5032 Chronic diastolic (congestive) heart failure: Secondary | ICD-10-CM | POA: Diagnosis not present

## 2023-12-04 DIAGNOSIS — E119 Type 2 diabetes mellitus without complications: Secondary | ICD-10-CM

## 2023-12-04 DIAGNOSIS — I7121 Aneurysm of the ascending aorta, without rupture: Secondary | ICD-10-CM

## 2023-12-04 DIAGNOSIS — E7800 Pure hypercholesterolemia, unspecified: Secondary | ICD-10-CM

## 2023-12-04 NOTE — Patient Instructions (Signed)
 Medication Instructions:  YOU CAN GET SOME VOLTAREN GEL OVER THE COUNTER AND USE FOR YOUR ARM YOU CAN ROTATE HEAT AND ICE FOR ARM PAIN *If you need a refill on your cardiac medications before your next appointment, please call your pharmacy*  Lab Work: None Ordered If you have labs (blood work) drawn today and your tests are completely normal, you will receive your results only by: MyChart Message (if you have MyChart) OR A paper copy in the mail If you have any lab test that is abnormal or we need to change your treatment, we will call you to review the results.  Testing/Procedures: None Ordered  Follow-Up: At [facility], you and your health needs are our priority.  As part of our continuing mission to provide you with exceptional heart care, our providers are all part of one team.  This team includes your primary Cardiologist (physician) and Advanced Practice Providers or APPs (Physician Assistants and Nurse Practitioners) who all work together to provide you with the care you need, when you need it.  Your next appointment:   6 month(s)  Provider:   Lonni Cash, MD    We recommend signing up for the patient portal called MyChart.  Sign up information is provided on this After Visit Summary.  MyChart is used to connect with patients for Virtual Visits (Telemedicine).  Patients are able to view lab/test results, encounter notes, upcoming appointments, etc.  Non-urgent messages can be sent to your provider as well.   To learn more about what you can do with MyChart, go to [email or web address].   Other Instructions Check your blood pressure daily for 2 weeks, then contact the office with your readings.  Contact the office either by phone or MyChart with your readings.  Make sure to check your blood pressure 2 hours after taking your medications.   AVOID these things for 30 minutes before checking your blood pressure: No Drinking caffeine. No Drinking  alcohol. No Eating. No Smoking. No Exercising.  Five minutes before checking your blood pressure: Pee. Sit in a dining chair. Avoid sitting in a soft couch or armchair. Be quiet. Do not talk.

## 2023-12-06 ENCOUNTER — Ambulatory Visit (HOSPITAL_COMMUNITY)
Admission: RE | Admit: 2023-12-06 | Discharge: 2023-12-06 | Disposition: A | Discharge status: Home / Self Care | Source: Ambulatory Visit

## 2023-12-06 ENCOUNTER — Encounter (HOSPITAL_COMMUNITY): Payer: Self-pay | Admitting: Radiology

## 2023-12-06 ENCOUNTER — Ambulatory Visit: Payer: Self-pay | Admitting: Family Medicine

## 2023-12-06 DIAGNOSIS — R519 Headache, unspecified: Secondary | ICD-10-CM | POA: Diagnosis present

## 2023-12-06 MED ORDER — GADOBUTROL 1 MMOL/ML IV SOLN
10.0000 mL | Freq: Once | INTRAVENOUS | Status: AC | PRN
  Administered 2023-12-06: 10 mL via INTRAVENOUS

## 2024-02-03 ENCOUNTER — Telehealth: Payer: Self-pay | Admitting: Cardiovascular Disease

## 2024-02-03 NOTE — Telephone Encounter (Signed)
 Pt inquiring about heart medications and thought it was discussed at last visit that he could come off of one of his medications after 6 months. Asymptomatic, just inquiring. Was noted to be seen in 6 months, however schedule would not open from recall. Forwarding this message to Dr. Verlin and his nurse. Pt verbalized understanding and available for scheduling with call back.

## 2024-02-03 NOTE — Telephone Encounter (Signed)
 Pt c/o medication issue:  1. Name of Medication:   Heart medication  2. How are you currently taking this medication (dosage and times per day)?   As prescribed  3. Are you having a reaction (difficulty breathing--STAT)?   4. What is your medication issue?   Patient stated he thought he was only supposed to be taking his heart medication for 6 months and wants a call back to discuss next steps.

## 2024-02-03 NOTE — Telephone Encounter (Signed)
 Alex Lonni BIRCH, MD to Me     02/03/24  2:25 PM He had a drug eluting stent placed on August 14, 2023. He could come off of Effient  at the six month point (February 13, 2024). Continue ASA. [place]  I spoke with patient and gave him message from Dr Alex

## 2024-06-04 ENCOUNTER — Ambulatory Visit: Admitting: Cardiovascular Disease
